# Patient Record
Sex: Female | Born: 1940 | Race: Black or African American | Hispanic: No | Smoking: Former smoker
Health system: Southern US, Community
[De-identification: ages and names within clinical notes are randomized; demographics above are authoritative.]

## PROBLEM LIST (undated history)

## (undated) DIAGNOSIS — E119 Type 2 diabetes mellitus without complications: Secondary | ICD-10-CM

## (undated) DIAGNOSIS — I10 Essential (primary) hypertension: Secondary | ICD-10-CM

## (undated) DIAGNOSIS — J449 Chronic obstructive pulmonary disease, unspecified: Secondary | ICD-10-CM

## (undated) DIAGNOSIS — F419 Anxiety disorder, unspecified: Secondary | ICD-10-CM

## (undated) DIAGNOSIS — F0390 Unspecified dementia without behavioral disturbance: Secondary | ICD-10-CM

## (undated) DIAGNOSIS — M1990 Unspecified osteoarthritis, unspecified site: Secondary | ICD-10-CM

## (undated) DIAGNOSIS — L89154 Pressure ulcer of sacral region, stage 4: Secondary | ICD-10-CM

## (undated) DIAGNOSIS — R64 Cachexia: Secondary | ICD-10-CM

## (undated) DIAGNOSIS — D649 Anemia, unspecified: Secondary | ICD-10-CM

## (undated) DIAGNOSIS — M4856XA Collapsed vertebra, not elsewhere classified, lumbar region, initial encounter for fracture: Secondary | ICD-10-CM

## (undated) HISTORY — PX: VAGINAL HYSTERECTOMY: SHX2639

---

## 2013-12-14 DIAGNOSIS — D649 Anemia, unspecified: Secondary | ICD-10-CM

## 2013-12-14 DIAGNOSIS — M4856XA Collapsed vertebra, not elsewhere classified, lumbar region, initial encounter for fracture: Secondary | ICD-10-CM

## 2013-12-14 DIAGNOSIS — R64 Cachexia: Secondary | ICD-10-CM

## 2013-12-14 HISTORY — DX: Collapsed vertebra, not elsewhere classified, lumbar region, initial encounter for fracture: M48.56XA

## 2013-12-14 HISTORY — DX: Cachexia: R64

## 2013-12-14 HISTORY — DX: Anemia, unspecified: D64.9

## 2013-12-26 ENCOUNTER — Emergency Department (HOSPITAL_COMMUNITY): Payer: Medicare Other

## 2013-12-26 ENCOUNTER — Encounter (HOSPITAL_COMMUNITY): Payer: Self-pay | Admitting: Emergency Medicine

## 2013-12-26 ENCOUNTER — Inpatient Hospital Stay (HOSPITAL_COMMUNITY)
Admission: EM | Admit: 2013-12-26 | Discharge: 2013-12-31 | DRG: 871 | Disposition: A | Discharge status: Home Health | Payer: Medicare Other | Attending: Family Medicine | Admitting: Family Medicine

## 2013-12-26 DIAGNOSIS — F0390 Unspecified dementia without behavioral disturbance: Secondary | ICD-10-CM | POA: Diagnosis present

## 2013-12-26 DIAGNOSIS — G9340 Encephalopathy, unspecified: Secondary | ICD-10-CM | POA: Diagnosis present

## 2013-12-26 DIAGNOSIS — L8990 Pressure ulcer of unspecified site, unspecified stage: Secondary | ICD-10-CM | POA: Diagnosis present

## 2013-12-26 DIAGNOSIS — J4489 Other specified chronic obstructive pulmonary disease: Secondary | ICD-10-CM | POA: Diagnosis present

## 2013-12-26 DIAGNOSIS — Z87891 Personal history of nicotine dependence: Secondary | ICD-10-CM

## 2013-12-26 DIAGNOSIS — A419 Sepsis, unspecified organism: Principal | ICD-10-CM | POA: Diagnosis present

## 2013-12-26 DIAGNOSIS — F3289 Other specified depressive episodes: Secondary | ICD-10-CM | POA: Diagnosis present

## 2013-12-26 DIAGNOSIS — J9600 Acute respiratory failure, unspecified whether with hypoxia or hypercapnia: Secondary | ICD-10-CM | POA: Diagnosis present

## 2013-12-26 DIAGNOSIS — Z681 Body mass index (BMI) 19 or less, adult: Secondary | ICD-10-CM

## 2013-12-26 DIAGNOSIS — L89159 Pressure ulcer of sacral region, unspecified stage: Secondary | ICD-10-CM

## 2013-12-26 DIAGNOSIS — R6520 Severe sepsis without septic shock: Secondary | ICD-10-CM

## 2013-12-26 DIAGNOSIS — J189 Pneumonia, unspecified organism: Secondary | ICD-10-CM | POA: Diagnosis present

## 2013-12-26 DIAGNOSIS — E46 Unspecified protein-calorie malnutrition: Secondary | ICD-10-CM

## 2013-12-26 DIAGNOSIS — F329 Major depressive disorder, single episode, unspecified: Secondary | ICD-10-CM | POA: Diagnosis present

## 2013-12-26 DIAGNOSIS — E43 Unspecified severe protein-calorie malnutrition: Secondary | ICD-10-CM | POA: Diagnosis present

## 2013-12-26 DIAGNOSIS — R64 Cachexia: Secondary | ICD-10-CM | POA: Diagnosis present

## 2013-12-26 DIAGNOSIS — L89109 Pressure ulcer of unspecified part of back, unspecified stage: Secondary | ICD-10-CM | POA: Diagnosis present

## 2013-12-26 DIAGNOSIS — I10 Essential (primary) hypertension: Secondary | ICD-10-CM | POA: Diagnosis present

## 2013-12-26 DIAGNOSIS — E872 Acidosis, unspecified: Secondary | ICD-10-CM | POA: Diagnosis present

## 2013-12-26 DIAGNOSIS — R0902 Hypoxemia: Secondary | ICD-10-CM

## 2013-12-26 DIAGNOSIS — R1311 Dysphagia, oral phase: Secondary | ICD-10-CM | POA: Diagnosis present

## 2013-12-26 DIAGNOSIS — E119 Type 2 diabetes mellitus without complications: Secondary | ICD-10-CM | POA: Diagnosis present

## 2013-12-26 DIAGNOSIS — Z9981 Dependence on supplemental oxygen: Secondary | ICD-10-CM

## 2013-12-26 DIAGNOSIS — R636 Underweight: Secondary | ICD-10-CM | POA: Diagnosis present

## 2013-12-26 DIAGNOSIS — J9620 Acute and chronic respiratory failure, unspecified whether with hypoxia or hypercapnia: Secondary | ICD-10-CM | POA: Diagnosis present

## 2013-12-26 DIAGNOSIS — J449 Chronic obstructive pulmonary disease, unspecified: Secondary | ICD-10-CM | POA: Diagnosis present

## 2013-12-26 HISTORY — DX: Chronic obstructive pulmonary disease, unspecified: J44.9

## 2013-12-26 HISTORY — DX: Anxiety disorder, unspecified: F41.9

## 2013-12-26 HISTORY — DX: Type 2 diabetes mellitus without complications: E11.9

## 2013-12-26 HISTORY — DX: Cachexia: R64

## 2013-12-26 HISTORY — DX: Unspecified osteoarthritis, unspecified site: M19.90

## 2013-12-26 HISTORY — DX: Essential (primary) hypertension: I10

## 2013-12-26 LAB — URINALYSIS, ROUTINE W REFLEX MICROSCOPIC
Bilirubin Urine: NEGATIVE
GLUCOSE, UA: NEGATIVE mg/dL
Hgb urine dipstick: NEGATIVE
Ketones, ur: NEGATIVE mg/dL
LEUKOCYTES UA: NEGATIVE
Nitrite: NEGATIVE
PH: 5 (ref 5.0–8.0)
Protein, ur: NEGATIVE mg/dL
Specific Gravity, Urine: 1.022 (ref 1.005–1.030)
Urobilinogen, UA: 1 mg/dL (ref 0.0–1.0)

## 2013-12-26 LAB — BASIC METABOLIC PANEL
BUN: 25 mg/dL — AB (ref 6–23)
CO2: 36 meq/L — AB (ref 19–32)
Calcium: 9.2 mg/dL (ref 8.4–10.5)
Chloride: 100 mEq/L (ref 96–112)
Creatinine, Ser: 0.61 mg/dL (ref 0.50–1.10)
GFR calc Af Amer: >90 mL/min (ref >90)
GFR calc non Af Amer: 89 mL/min — ABNORMAL LOW (ref >90)
GLUCOSE: 87 mg/dL (ref 70–99)
POTASSIUM: 4.2 meq/L (ref 3.7–5.3)
SODIUM: 146 meq/L (ref 137–147)

## 2013-12-26 LAB — CBC
HEMATOCRIT: 37.8 % (ref 36.0–46.0)
HEMOGLOBIN: 11.5 g/dL — AB (ref 12.0–15.0)
MCH: 30.6 pg (ref 26.0–34.0)
MCHC: 30.4 g/dL (ref 30.0–36.0)
MCV: 100.5 fL — ABNORMAL HIGH (ref 78.0–100.0)
Platelets: 275 10*3/uL (ref 150–400)
RBC: 3.76 MIL/uL — AB (ref 3.87–5.11)
RDW: 13 % (ref 11.5–15.5)
WBC: 16.8 10*3/uL — ABNORMAL HIGH (ref 4.0–10.5)

## 2013-12-26 LAB — POCT I-STAT 3, ART BLOOD GAS (G3+)
ACID-BASE EXCESS: 11 mmol/L — AB (ref 0.0–2.0)
BICARBONATE: 40.3 meq/L — AB (ref 20.0–24.0)
O2 Saturation: 75 %
TCO2: 43 mmol/L (ref 0–100)
pCO2 arterial: 77.6 mmHg (ref 35.0–45.0)
pH, Arterial: 7.323 — ABNORMAL LOW (ref 7.350–7.450)
pO2, Arterial: 46 mmHg — ABNORMAL LOW (ref 80.0–100.0)

## 2013-12-26 LAB — MRSA PCR SCREENING: MRSA BY PCR: NEGATIVE

## 2013-12-26 LAB — POCT I-STAT TROPONIN I: TROPONIN I, POC: 0 ng/mL (ref 0.00–0.08)

## 2013-12-26 LAB — GLUCOSE, CAPILLARY
Glucose-Capillary: 112 mg/dL — ABNORMAL HIGH (ref 70–99)
Glucose-Capillary: 141 mg/dL — ABNORMAL HIGH (ref 70–99)

## 2013-12-26 LAB — PROCALCITONIN: Procalcitonin: 0.1 ng/mL

## 2013-12-26 LAB — CG4 I-STAT (LACTIC ACID): Lactic Acid, Venous: 0.45 mmol/L — ABNORMAL LOW (ref 0.5–2.2)

## 2013-12-26 LAB — PRO B NATRIURETIC PEPTIDE: Pro B Natriuretic peptide (BNP): 130.4 pg/mL — ABNORMAL HIGH (ref 0–125)

## 2013-12-26 MED ORDER — METHYLPREDNISOLONE SODIUM SUCC 125 MG IJ SOLR
125.0000 mg | Freq: Four times a day (QID) | INTRAMUSCULAR | Status: DC
  Administered 2013-12-26 – 2013-12-28 (×7): 125 mg via INTRAVENOUS
  Filled 2013-12-26 (×11): qty 2

## 2013-12-26 MED ORDER — DEXTROSE 5 % IV SOLN
2.0000 g | Freq: Once | INTRAVENOUS | Status: AC
  Administered 2013-12-26: 2 g via INTRAVENOUS
  Filled 2013-12-26: qty 2

## 2013-12-26 MED ORDER — TRAMADOL HCL 50 MG PO TABS: 50.0000 mg | ORAL_TABLET | Freq: Once | ORAL | Status: DC

## 2013-12-26 MED ORDER — KCL IN DEXTROSE-NACL 20-5-0.9 MEQ/L-%-% IV SOLN
INTRAVENOUS | Status: DC
  Administered 2013-12-26 – 2013-12-28 (×3): via INTRAVENOUS
  Administered 2013-12-28: 999 mL via INTRAVENOUS
  Filled 2013-12-26 (×5): qty 1000

## 2013-12-26 MED ORDER — HEPARIN SODIUM (PORCINE) 5000 UNIT/ML IJ SOLN
5000.0000 [IU] | Freq: Three times a day (TID) | INTRAMUSCULAR | Status: DC
  Administered 2013-12-26 – 2013-12-31 (×15): 5000 [IU] via SUBCUTANEOUS
  Filled 2013-12-26 (×17): qty 1

## 2013-12-26 MED ORDER — QUETIAPINE FUMARATE 25 MG PO TABS
25.0000 mg | ORAL_TABLET | Freq: Every day | ORAL | Status: DC
  Administered 2013-12-26 – 2013-12-30 (×5): 25 mg via ORAL
  Filled 2013-12-26 (×6): qty 1

## 2013-12-26 MED ORDER — FAMOTIDINE 40 MG PO TABS
40.0000 mg | ORAL_TABLET | Freq: Two times a day (BID) | ORAL | Status: DC
  Administered 2013-12-26 – 2013-12-31 (×10): 40 mg via ORAL
  Filled 2013-12-26 (×11): qty 1

## 2013-12-26 MED ORDER — IPRATROPIUM-ALBUTEROL 0.5-2.5 (3) MG/3ML IN SOLN
3.0000 mL | Freq: Four times a day (QID) | RESPIRATORY_TRACT | Status: DC
  Administered 2013-12-26 – 2013-12-28 (×9): 3 mL via RESPIRATORY_TRACT
  Filled 2013-12-26 (×9): qty 3

## 2013-12-26 MED ORDER — MORPHINE SULFATE 2 MG/ML IJ SOLN
1.0000 mg | INTRAMUSCULAR | Status: DC | PRN
  Administered 2013-12-27 – 2013-12-28 (×2): 1 mg via INTRAVENOUS
  Filled 2013-12-26 (×2): qty 1

## 2013-12-26 MED ORDER — CITALOPRAM HYDROBROMIDE 10 MG/5ML PO SOLN: 15.0000 mg | Freq: Every day | ORAL | Status: DC

## 2013-12-26 MED ORDER — PERPHENAZINE 4 MG PO TABS
6.0000 mg | ORAL_TABLET | Freq: Every day | ORAL | Status: DC
  Administered 2013-12-26 – 2013-12-30 (×5): 6 mg via ORAL
  Filled 2013-12-26 (×6): qty 1

## 2013-12-26 MED ORDER — CITALOPRAM HYDROBROMIDE 10 MG PO TABS
15.0000 mg | ORAL_TABLET | Freq: Every day | ORAL | Status: DC
  Administered 2013-12-27 – 2013-12-31 (×5): 15 mg via ORAL
  Filled 2013-12-26 (×5): qty 2

## 2013-12-26 MED ORDER — ONDANSETRON HCL 4 MG/2ML IJ SOLN: 4.0000 mg | Freq: Four times a day (QID) | INTRAMUSCULAR | Status: DC | PRN

## 2013-12-26 MED ORDER — DONEPEZIL HCL 10 MG PO TABS
10.0000 mg | ORAL_TABLET | Freq: Every day | ORAL | Status: DC
  Administered 2013-12-26 – 2013-12-30 (×5): 10 mg via ORAL
  Filled 2013-12-26 (×6): qty 1

## 2013-12-26 MED ORDER — MEGESTROL ACETATE 40 MG/ML PO SUSP
400.0000 mg | Freq: Every day | ORAL | Status: DC
  Administered 2013-12-27 – 2013-12-31 (×5): 400 mg via ORAL
  Filled 2013-12-26 (×5): qty 10

## 2013-12-26 MED ORDER — FERROUS SULFATE 325 (65 FE) MG PO TABS
325.0000 mg | ORAL_TABLET | Freq: Three times a day (TID) | ORAL | Status: DC
  Administered 2013-12-26 – 2013-12-29 (×7): 325 mg via ORAL
  Filled 2013-12-26 (×11): qty 1

## 2013-12-26 MED ORDER — VANCOMYCIN HCL IN DEXTROSE 750-5 MG/150ML-% IV SOLN
750.0000 mg | Freq: Once | INTRAVENOUS | Status: AC
  Administered 2013-12-26: 750 mg via INTRAVENOUS
  Filled 2013-12-26: qty 150

## 2013-12-26 MED ORDER — AZTREONAM 2 G IJ SOLR
2.0000 g | Freq: Three times a day (TID) | INTRAMUSCULAR | Status: DC
  Administered 2013-12-26 – 2013-12-29 (×7): 2 g via INTRAVENOUS
  Filled 2013-12-26 (×12): qty 2

## 2013-12-26 MED ORDER — ACETAMINOPHEN 650 MG RE SUPP: 650.0000 mg | Freq: Four times a day (QID) | RECTAL | Status: DC | PRN

## 2013-12-26 MED ORDER — ACETAMINOPHEN 325 MG PO TABS
650.0000 mg | ORAL_TABLET | Freq: Four times a day (QID) | ORAL | Status: DC | PRN
Start: 2013-12-26 — End: 2013-12-31
  Administered 2013-12-31: 650 mg via ORAL
  Filled 2013-12-26: qty 2

## 2013-12-26 MED ORDER — ILOPERIDONE 1 MG PO TABS
3.0000 mg | ORAL_TABLET | Freq: Every day | ORAL | Status: DC
  Administered 2013-12-26 – 2013-12-30 (×5): 3 mg via ORAL
  Filled 2013-12-26 (×6): qty 3

## 2013-12-26 MED ORDER — ONDANSETRON HCL 4 MG PO TABS: 4.0000 mg | ORAL_TABLET | Freq: Four times a day (QID) | ORAL | Status: DC | PRN

## 2013-12-26 MED ORDER — VANCOMYCIN HCL IN DEXTROSE 1-5 GM/200ML-% IV SOLN: 1000.0000 mg | Freq: Once | INTRAVENOUS | Status: DC

## 2013-12-26 MED ORDER — VANCOMYCIN HCL IN DEXTROSE 750-5 MG/150ML-% IV SOLN
750.0000 mg | INTRAVENOUS | Status: DC
  Administered 2013-12-27 – 2013-12-28 (×2): 750 mg via INTRAVENOUS
  Filled 2013-12-26 (×3): qty 150

## 2013-12-26 MED ORDER — SODIUM CHLORIDE 0.9 % IJ SOLN
3.0000 mL | Freq: Two times a day (BID) | INTRAMUSCULAR | Status: DC
  Administered 2013-12-26 – 2013-12-28 (×4): 3 mL via INTRAVENOUS

## 2013-12-26 NOTE — Progress Notes (Signed)
**Note De-Identified  Obfuscation** note patient removed from BIPAP to [facility], per Dr. Adriana Simas

## 2013-12-26 NOTE — Progress Notes (Signed)
ANTIBIOTIC CONSULT NOTE - INITIAL  Pharmacy Consult for Vancomycin and Aztreonam Indication: pneumonia  Allergies  Allergen Reactions  . Namenda [Memantine Hcl] Other (See Comments)    "hallucinations"   . Penicillins Other (See Comments)    unknown    Patient Measurements: Height: 5\' 1"  (154.9 cm) Weight: 94 lb (42.638 kg) IBW/kg (Calculated) : 47.8 Adjusted Body Weight:   Vital Signs: Temp: 98.8 F (37.1 C) (02/13 1239) Temp src: Rectal (02/13 1239) BP: 112/50 mmHg (02/13 1126) Pulse Rate: 104 (02/13 1041) Intake/Output from previous day:   Intake/Output from this shift:    Labs:  Recent Labs  12/26/13 1130  WBC 16.8*  HGB 11.5*  PLT 275  CREATININE 0.61   Estimated Creatinine Clearance: 43.4 ml/min (by C-G formula based on Cr of 0.61). No results found for this basename: VANCOTROUGH, VANCOPEAK, VANCORANDOM, GENTTROUGH, GENTPEAK, GENTRANDOM, TOBRATROUGH, TOBRAPEAK, TOBRARND, AMIKACINPEAK, AMIKACINTROU, AMIKACIN,  in the last 72 hours   Microbiology: No results found for this or any previous visit (from the past 720 hour(s)).  Medical History: Past Medical History  Diagnosis Date  . Hypertension   . COPD (chronic obstructive pulmonary disease)   . Arthritis   . Oxygen dependent   . Anxiety     Medications:  Scheduled:  . vancomycin  750 mg Intravenous Once  . [START ON 12/27/2013] vancomycin  750 mg Intravenous Q24H   Assessment: 73yo female with pneumonia, to start Aztreonam and Vancomycin.  Calculated CrCl ~91ml/min.  Aztreonam 2gm and Vancomycin 1000mg  have been ordered, but not administered yet pending cultures to be drawn.  Goal of Therapy:  Vancomycin trough level 15-20 mcg/ml  Plan:  1-  Aztreonam 2gm IV q8 2-  D/C Vancomycin 1000mg ; informed RN 3-  Vancomycin 750mg  IV q24 4-  Watch renal fxn and f/u cultures  Marisue Humble, PharmD Clinical Pharmacist [facility]

## 2013-12-26 NOTE — Progress Notes (Signed)
Pt transported to 2C06 from E45 on BIPAP without incidence. RT will continue to monitor.

## 2013-12-26 NOTE — ED Notes (Signed)
Pt unable to void at this time, will offer again.

## 2013-12-26 NOTE — ED Notes (Signed)
Pt transported to radiology for CXR 

## 2013-12-26 NOTE — ED Notes (Signed)
Admitting MD at bedside.

## 2013-12-26 NOTE — H&P (Signed)
Family Medicine Teaching Service Attending Note  I interviewed and examined patient Katelyn Rojas and reviewed their tests and x-rays.  I discussed with Dr. Adriana Simas and reviewed their note for today.  I agree with their assessment and plan.     Additionally  Alert oriented x 3 Very thin on bipap ABG noted Treat for HCAP and follow ABGs and O2 sats Niece and patient desire full code but likely not prolonged intubation ventilatory support

## 2013-12-26 NOTE — ED Notes (Signed)
Pt encouraged to breath through nose. MD notified of SpO2 level - instructed RN to increase O2 flow on [place].

## 2013-12-26 NOTE — ED Notes (Signed)
Pt in with family stating that patient has chronic shortness of breath and COPD, pt was recently in the hospital, since being home patient O2 level has still been dropping and she is still sounding congested in her chest, pt also has a wound vac to a wound on her buttocks, denies fevers since being home.

## 2013-12-26 NOTE — ED Provider Notes (Addendum)
CSN: [id number]     Arrival date & time 12/26/13  1015 History   First MD Initiated Contact with Patient 12/26/13 1107     Chief Complaint  Patient presents with  . Shortness of Breath  . Congestion      (Consider location/radiation/quality/duration/timing/severity/associated sxs/prior Treatment) HPI   This a 73 year old female with history of COPD, hypertension who presents with shortness of breath and cough as well as hypoxia. At baseline the patient is on 2.5 L of oxygen at home. Per the patient's family member, the patient was just discharged from [facility] for COPD exacerbation and presumed pneumonia. She just finished a course of Levaquin and is currently on a prednisone taper. She did take a prednisone this morning. Per the patient's family member, the patient is still coughing and had to have increases in her oxygen delivery this morning in order to maintain her oxygen saturations. Family also reports patient has had increased somnolence. They report last April she had a similar episode and she was "retaining." He denies any recent fevers. Patient is incontinent of urine and does have mild dementia. Patient is able to tell me she doesn't hurt anywhere and is able to answer simple questions.  Past Medical History  Diagnosis Date  . Hypertension   . COPD (chronic obstructive pulmonary disease)   . Arthritis   . Oxygen dependent   . Anxiety   . Cachexia   . DM type 2 (diabetes mellitus, type 2)    Past Surgical History  Procedure Laterality Date  . Vaginal hysterectomy      ? Abdominal vs. Vaginal   No family history on file. History  Substance Use Topics  . Smoking status: Former Games developer  . Smokeless tobacco: Not on file  . Alcohol Use: Not on file   OB History   Grav Para Term Preterm Abortions TAB SAB Ect Mult Living                 Review of Systems  Constitutional: Negative for fever.  Respiratory: Positive for cough, shortness of breath and wheezing. Negative for  chest tightness.   Cardiovascular: Negative for chest pain and leg swelling.  Gastrointestinal: Negative for nausea, vomiting and abdominal pain.  Genitourinary: Negative for dysuria.  Musculoskeletal: Negative for back pain.  Skin: Negative for wound.  Neurological: Negative for headaches.  Psychiatric/Behavioral: Negative for confusion.       Increase lethargy  All other systems reviewed and are negative.      Allergies  Namenda and Penicillins  Home Medications   Current Outpatient Rx  Name  Route  Sig  Dispense  Refill  . albuterol (PROVENTIL HFA;VENTOLIN HFA) 108 (90 BASE) MCG/ACT inhaler   Inhalation   Inhale 1-2 puffs into the lungs every 6 (six) hours as needed for wheezing or shortness of breath.         Marland Kitchen albuterol (PROVENTIL) (2.5 MG/3ML) 0.083% nebulizer solution   Nebulization   Take 2.5 mg by nebulization every 4 (four) hours as needed for wheezing or shortness of breath.         . citalopram (CELEXA) 10 MG/5ML suspension   Oral   Take 15 mg by mouth daily.         Marland Kitchen Dextromethorphan-Guaifenesin (GUAIFENESIN DM) 400-20 MG TABS   Oral   Take 1 tablet by mouth every 4 (four) hours as needed (congestion).         Marland Kitchen donepezil (ARICEPT) 10 MG tablet   Oral   Take  10 mg by mouth at bedtime.         . famotidine (PEPCID) 20 MG tablet   Oral   Take 40 mg by mouth 2 (two) times daily.         . ferrous sulfate 325 (65 FE) MG tablet   Oral   Take 325 mg by mouth 3 (three) times daily with meals.         . Fluticasone-Salmeterol (ADVAIR) 250-50 MCG/DOSE AEPB   Inhalation   Inhale 1 puff into the lungs 2 (two) times daily.         . furosemide (LASIX) 20 MG tablet   Oral   Take 20 mg by mouth daily.         . Iloperidone (FANAPT) 1 MG TABS   Oral   Take 3 mg by mouth at bedtime.         Marland Kitchen levofloxacin (LEVAQUIN) 500 MG tablet   Oral   Take 500 mg by mouth daily.         . megestrol (MEGACE) 40 MG/ML suspension   Oral   Take  400 mg by mouth daily.         . naproxen sodium (ANAPROX) 220 MG tablet   Oral   Take 220 mg by mouth 2 (two) times daily as needed (arthritis).         Marland Kitchen perphenazine (TRILAFON) 2 MG tablet   Oral   Take 6 mg by mouth at bedtime.         . predniSONE (DELTASONE) 5 MG tablet   Oral   Take 5-20 mg by mouth See admin instructions. Patient on taper pack: take 10mg  twice daily on day 6 (2-13), take 5mg  three times daily on day 7 and day 8 (2-14, 2-15), take 5mg  twice daily on day 9 and day 10 (2-16, 2-17).  Once completed pack, patient to continue to take 10mg  daily         . QUEtiapine (SEROQUEL) 25 MG tablet   Oral   Take 25 mg by mouth at bedtime.         . traMADol (ULTRAM) 50 MG tablet   Oral   Take 50 mg by mouth every 6 (six) hours as needed for moderate pain.         . Vitamin D, Ergocalciferol, (DRISDOL) 50000 UNITS CAPS capsule   Oral   Take 50,000 Units by mouth every 7 (seven) days. Monday          BP 108/44  Pulse 87  Temp(Src) 98.8 F (37.1 C) (Rectal)  Resp 16  Ht 5\' 1"  (1.549 m)  Wt 94 lb (42.638 kg)  BMI 17.77 kg/m2  SpO2 100% Physical Exam  Nursing note and vitals reviewed. Constitutional: She is oriented to person, place, and time.  Elderly, frail appearing, chronically ill-appearing  HENT:  Head: Normocephalic and atraumatic.  Mucous membranes dry  Eyes: Pupils are equal, round, and reactive to light.  Neck: Neck supple. No JVD present.  Cardiovascular: Normal rate, regular rhythm and normal heart sounds.   No murmur heard. Pulmonary/Chest: Effort normal. No respiratory distress. She has no wheezes.  Coarse breath sounds with crackles in the bases, decreased breath sounds right lower lung, mouth breathing  Abdominal: Soft. Bowel sounds are normal. There is no tenderness.  Musculoskeletal: She exhibits no edema.  Neurological: She is alert and oriented to person, place, and time.  Sleepy but arousable, oriented x3, moves all 4  extremities, 5 out of  5 strength in all 4 extremity  Skin: No rash noted.  Sacral decubitus ulcer with wound vac in place  Psychiatric:  Lethargic    ED Course  Procedures (including critical care time) Labs Review Labs Reviewed  CBC - Abnormal; Notable for the following:    WBC 16.8 (*)    RBC 3.76 (*)    Hemoglobin 11.5 (*)    MCV 100.5 (*)    All other components within normal limits  BASIC METABOLIC PANEL - Abnormal; Notable for the following:    CO2 36 (*)    BUN 25 (*)    GFR calc non Af Amer 89 (*)    All other components within normal limits  PRO B NATRIURETIC PEPTIDE - Abnormal; Notable for the following:    Pro B Natriuretic peptide (BNP) 130.4 (*)    All other components within normal limits  POCT I-STAT 3, BLOOD GAS (G3+) - Abnormal; Notable for the following:    pH, Arterial 7.323 (*)    pCO2 arterial 77.6 (*)    pO2, Arterial 46.0 (*)    Bicarbonate 40.3 (*)    Acid-Base Excess 11.0 (*)    All other components within normal limits  CG4 I-STAT (LACTIC ACID) - Abnormal; Notable for the following:    Lactic Acid, Venous 0.45 (*)    All other components within normal limits  CULTURE, BLOOD (ROUTINE X 2)  CULTURE, BLOOD (ROUTINE X 2)  URINALYSIS, ROUTINE W REFLEX MICROSCOPIC  POCT I-STAT TROPONIN I   Imaging Review Dg Chest 2 View  12/26/2013   CLINICAL DATA:  Shortness of breath and chest congestion.  EXAM: CHEST  2 VIEW  COMPARISON:  12/16/2013 and 12/14/2013  FINDINGS: There is a new infiltrate at the right lung base. Heart size and pulmonary vascularity are normal. Lungs are hyperinflated consistent with COPD. There is diffuse osteopenia with accentuation of the thoracic kyphosis.  There is a new compression fracture of T6 and and more severe compression fractures of T7 and T8 since 12/14/2013.  IMPRESSION: 1. New infiltrate at the right lung base. This is superimposed on severe chronic lung disease. 2. Multiple compression fractures in the mid thoracic spine,  increased since 12/14/2013.   Electronically Signed   By: Geanie CooleyJim  Maxwell M.D.   On: 12/26/2013 12:11    EKG Interpretation    Date/Time:  Friday December 26 2013 10:41:27 EST Ventricular Rate:  103 PR Interval:  126 QRS Duration: 62 QT Interval:  326 QTC Calculation: 427 R Axis:   50 Text Interpretation:  Sinus tachycardia Otherwise normal ECG No prior for comparison Confirmed by HORTON  MD, Toni AmendOURTNEY ([id number]) on 12/26/2013 11:23:19 AM           CRITICAL CARE Performed by: Ross MarcusHORTON, COURTNEY, F   Total critical care time: 35 min  Critical care time was exclusive of separately billable procedures and treating other patients.  Critical care was necessary to treat or prevent imminent or life-threatening deterioration.  Critical care was time spent personally by me on the following activities: development of treatment plan with patient and/or surrogate as well as nursing, discussions with consultants, evaluation of patient's response to treatment, examination of patient, obtaining history from patient or surrogate, ordering and performing treatments and interventions, ordering and review of laboratory studies, ordering and review of radiographic studies, pulse oximetry and re-evaluation of patient's condition.   MDM   Final diagnoses:  HCAP (healthcare-associated pneumonia)  COPD (chronic obstructive pulmonary disease)  Hypoxia   This is a  73 year old female with a history of end-stage COPD. She is lethargic but arousable on exam. Patient is also requiring more supple and oxygen than normal. Results showed a new infiltrate on chest x-ray. She has risk factors for health care acquired pneumonia. Patient will be given aztreonam and vancomycin. ABG is consistent with low O2 and increased CO2 which may be contributing to patient's mental status. Will place on BiPAP given respiratory status and mental status. Urinalysis is pending. Patient will be admitted to the hospital for further  management.    Shon Baton, MD 12/26/13 1444  Shon Baton, MD 12/26/13 [phone]

## 2013-12-26 NOTE — H&P (Signed)
Family Medicine Teaching [facility] History and Physical Service Pager: [phone]  Patient name: Katelyn Rojas Knappenberger Medical record number: [id number] Date of birth: 05/23/1941 Age: 73 y.o. Gender: female  Primary Care Provider: No PCP Per Patient Consultants: None Code Status: Full code  Chief Complaint: SOB  Assessment and Plan: Katelyn Rojas Gartner is a 73 y.o. female presenting with Sepsis secondary to HCAP and acute respiratory failure. PMH is significant for severe COPD, chronic respiratory failure on home oxygen and BiPAP, hypertension, dementia, and diet-controlled DM-2.   Sepsis secondary to HCAP -  patient with infiltrate on x-ray and recent hospitalization.   - Will admit to step down - Continuing broad spectrum antibiotics: Vancomycin, aztreonam.  If fails to improve may need to broaden coverage.   - Will trend Procalcitonin - Daily CBC  Acute on Chronic Respiratory failure, COPD - likely multifactorial and secondary to HCAP and underlying COPD - Antibiotics as above - Solumedrol 125 mg Q6H; with plans for prolonged taper - Scheduled Duonebs, Q6 - Continue BiPAP.  Will repeat ABG later this evening.  - Will monitor closely as patient is at high risk for decompensating.   Hx of HTN - Stable currently  - No medications needed at this time - Will monitor closley, especially for hypotension in the setting of sepsis.  Dementia and Hx of Depression - Continuing home Aricept, IIoperidone, Perphenazine, and Seroquel (discussed this with pharmacy)  Cachexia, and likely Protein calorie malnutrition - Continuing Megace. - Will consider nutrition consult during admission.  Sacral decubitus ulcer - Wound VAC in place.   - Will consult wound care.   DM-2, diet controlled -  will monitor CBGs.  FEN/GI: D5NS w/ 20 KCl @ 75 mL/hr. NPO currently. Prophylaxis: Heparin  Disposition: Stepdown, pending clinical improvement.  History of Present Illness:  Katelyn Rojas  Evans is a 73 y.o. female presenting with severe COPD, chronic respiratory failure on home oxygen and BiPAP, hypertension, dementia, and diet-controlled DM-2 who presents with shortness of breath and hypoxia.  Level V caveat applies as patient is on BiPAP and cannot give complete history.  History obtained from the caregiver (niece) and EMR.  Niece reports that Ms. Nolen MuMcKinney was seen in urgent care on urgent care on February 1st for cough and "rattling in the chest".  At that time a chest x-ray was performed and was negative.  She was placed on steroids and antibiotics for COPD exacerbation.  She continued to not do well and was admitted to [facility] on [facility]rd.  She was treated for pneumonia and for COPD exacerbation.  She required continued BiPAP during admission for CO2 narcosis.  Patient improved and was discharged on Saturday February 7th.    Today, the niece reports that she became hypoxic at home with oxygen saturation in the 50s.  She also became more somnolent.  Given these findings she was taken to the emergency room for evaluation.    In the ED, patient continued to be hypoxic.  ABG revealed PCO2 of 77.6 and PO2 of 46.  Patient was placed on BiPAP.  Further workup revealed, right lower lobe pneumonia and lactic acidosis (lactate 0.45).  Urinalysis was unremarkable.    Review Of Systems: Per HPI with the following additions: Cough, shortness of breath.  Remainder of review of systems unable to be obtained due to level V caveat.  Patient Active Problem List   Diagnosis Date Noted  . Acute respiratory failure 12/26/2013   Past Medical History: Past Medical History  Diagnosis Date  . Hypertension   . COPD (chronic obstructive pulmonary disease)   . Arthritis   . Oxygen dependent   . Anxiety   . Cachexia   . DM type 2 (diabetes mellitus, type 2)    Past Surgical History: Past Surgical History  Procedure Laterality Date  . Vaginal hysterectomy      ?  Abdominal vs. Vaginal    Social History: History  Substance Use Topics  . Smoking status: Former Games developer  . Smokeless tobacco: Not on file  . Alcohol Use: Not on file   Please also refer to relevant sections of EMR.  Family History: No family history on file.  Allergies and Medications: Allergies  Allergen Reactions  . Namenda [Memantine Hcl] Other (See Comments)    "hallucinations"   . Penicillins Other (See Comments)    unknown   Objective: BP 108/44  Pulse 87  Temp(Src) 99.5 F (37.5 C) (Rectal)  Resp 16  Ht 5\' 1"  (1.549 m)  Wt 94 lb (42.638 kg)  BMI 17.77 kg/m2  SpO2 100% Exam: General: Chronically ill appearing African American female, somnolent but arousable, on BiPAP.  Appears comfortable. HEENT: NCAT. No scleral icterus. Small pupils, but ERRLA.  Cardiovascular: Tachycardic.  Did not appreciate murmur. Respiratory: Decreased, course breath sounds with bibasilar crackles R>L. Abdomen: Flat, soft, diffusely tender to palpation.  No rebound or guarding.  Midline scar present below the umbilicus. Extremities: No LE edema. Skin: Sacral decubitus ulcer with wound VAC in place. Neuro: Easily arousable. Oriented to person and place.   Labs and Imaging: CBC BMET   Recent Labs Lab 12/26/13 1130  WBC 16.8*  HGB 11.5*  HCT 37.8  PLT 275    Recent Labs Lab 12/26/13 1130  NA 146  K 4.2  CL 100  CO2 36*  BUN 25*  CREATININE 0.61  GLUCOSE 87  CALCIUM 9.2     BNP    Component Value Date/Time   PROBNP 130.4* 12/26/2013 1130   Lactic Acid - 0.45  ABG    Component Value Date/Time   PHART 7.323* 12/26/2013 1252   PCO2ART 77.6* 12/26/2013 1252   PO2ART 46.0* 12/26/2013 1252   HCO3 40.3* 12/26/2013 1252   TCO2 43 12/26/2013 1252   O2SAT 75.0 12/26/2013 1252   Dg Chest 2 View 12/26/2013   CLINICAL DATA:  Shortness of breath and chest congestion.  EXAM: CHEST  2 VIEW  COMPARISON:  12/16/2013 and 12/14/2013  FINDINGS: There is a new infiltrate at the right  lung base. Heart size and pulmonary vascularity are normal. Lungs are hyperinflated consistent with COPD. There is diffuse osteopenia with accentuation of the thoracic kyphosis.  There is a new compression fracture of T6 and and more severe compression fractures of T7 and T8 since 12/14/2013.  IMPRESSION: 1. New infiltrate at the right lung base. This is superimposed on severe chronic lung disease. 2. Multiple compression fractures in the mid thoracic spine, increased since 12/14/2013.     Tommie Sams, DO 12/26/2013, 3:26 PM PGY-2, [facility] FPTS Intern pager: [phone], text pages welcome

## 2013-12-27 DIAGNOSIS — J449 Chronic obstructive pulmonary disease, unspecified: Secondary | ICD-10-CM

## 2013-12-27 DIAGNOSIS — L89109 Pressure ulcer of unspecified part of back, unspecified stage: Secondary | ICD-10-CM

## 2013-12-27 DIAGNOSIS — L8990 Pressure ulcer of unspecified site, unspecified stage: Secondary | ICD-10-CM

## 2013-12-27 DIAGNOSIS — F0390 Unspecified dementia without behavioral disturbance: Secondary | ICD-10-CM

## 2013-12-27 DIAGNOSIS — R0902 Hypoxemia: Secondary | ICD-10-CM

## 2013-12-27 LAB — BLOOD GAS, ARTERIAL
Acid-Base Excess: 11.9 mmol/L — ABNORMAL HIGH (ref 0.0–2.0)
BICARBONATE: 37.2 meq/L — AB (ref 20.0–24.0)
Drawn by: 249101
O2 Content: 4 L/min
O2 SAT: 95.3 %
Patient temperature: 98.6
TCO2: 39.1 mmol/L (ref 0–100)
pCO2 arterial: 61.3 mmHg (ref 35.0–45.0)
pH, Arterial: 7.4 (ref 7.350–7.450)
pO2, Arterial: 71.9 mmHg — ABNORMAL LOW (ref 80.0–100.0)

## 2013-12-27 LAB — CBC
HCT: 33 % — ABNORMAL LOW (ref 36.0–46.0)
Hemoglobin: 10 g/dL — ABNORMAL LOW (ref 12.0–15.0)
MCH: 30.3 pg (ref 26.0–34.0)
MCHC: 30.3 g/dL (ref 30.0–36.0)
MCV: 100 fL (ref 78.0–100.0)
Platelets: 274 10*3/uL (ref 150–400)
RBC: 3.3 MIL/uL — ABNORMAL LOW (ref 3.87–5.11)
RDW: 13.1 % (ref 11.5–15.5)
WBC: 16.9 10*3/uL — AB (ref 4.0–10.5)

## 2013-12-27 LAB — BASIC METABOLIC PANEL
BUN: 21 mg/dL (ref 6–23)
CHLORIDE: 99 meq/L (ref 96–112)
CO2: 35 mEq/L — ABNORMAL HIGH (ref 19–32)
CREATININE: 0.55 mg/dL (ref 0.50–1.10)
Calcium: 8.8 mg/dL (ref 8.4–10.5)
GFR calc Af Amer: >90 mL/min (ref >90)
GFR calc non Af Amer: >90 mL/min (ref >90)
GLUCOSE: 179 mg/dL — AB (ref 70–99)
Potassium: 4.6 mEq/L (ref 3.7–5.3)
Sodium: 143 mEq/L (ref 137–147)

## 2013-12-27 LAB — GLUCOSE, CAPILLARY
Glucose-Capillary: 162 mg/dL — ABNORMAL HIGH (ref 70–99)
Glucose-Capillary: 160 mg/dL — ABNORMAL HIGH (ref 70–99)
Glucose-Capillary: 156 mg/dL — ABNORMAL HIGH (ref 70–99)
Glucose-Capillary: 148 mg/dL — ABNORMAL HIGH (ref 70–99)

## 2013-12-27 LAB — TROPONIN I: Troponin I: <0.3 ng/mL (ref <0.30)

## 2013-12-27 MED ORDER — TRAMADOL HCL 50 MG PO TABS
50.0000 mg | ORAL_TABLET | Freq: Four times a day (QID) | ORAL | Status: DC | PRN
  Administered 2013-12-30 – 2013-12-31 (×2): 50 mg via ORAL
  Filled 2013-12-27 (×2): qty 1

## 2013-12-27 MED ORDER — LORAZEPAM 2 MG/ML IJ SOLN: 1.0000 mg | Freq: Once | INTRAMUSCULAR | Status: AC | PRN

## 2013-12-27 NOTE — Progress Notes (Signed)
Family Practice Teaching Service Interval Progress Note  Subjective: Since morning rounds, patient reported to have developed increased confusion per daughter, evaluated patient at that time and patient did appear to be slightly less alert and with decreased awareness, but still appropriately oriented. Overall stable on 4L O2 without any acute concerns. Discussed potential for increased CO2, given h/o COPD and chronic CO2 retention causing inc confusion. Ordered ABG. Paged around 1730 regarding new ABG results overall improved since prior ABG on admission (24 hrs ago), but with persistent CO2 elevation. Requested pt to be placed back on BiPAP (continue overnight) due to clinical change.  Re-evaluated patient around 1945 after being placed on BiPAP. Patient was very alert and greeted me with a strong voice as I entered her room, she states that she "feels much better" and is "breathing better". Her daughter was at bedside, and confirms that she seems to be improved on BiPAP. Still c/o occasional coughing, now with some improved secretion mobilization and mucus production. Also admits some chronic chest / abdominal pain that she frequently experiences, denies any new changes.  Objective: BP 143/66  Pulse 82  Temp(Src) 99.3 F (37.4 C) (Axillary)  Resp 15  Ht 5\' 1"  (1.549 m)  Wt 86 lb 13.8 oz (39.4 kg)  BMI 16.42 kg/m2  SpO2 100% General: Chronically ill and cachetic, awake watching TV, pleasant, on BiPAP, appears comfortable and NAD HEENT: PERRL Cardiovascular: RRR, no murmur Respiratory: Mildly improved air movement b/l, with persistent coarse breath sounds with bibasilar crackles R>L Abdomen: Flat, soft, stable generalized tenderness to palpation. No rebound or guarding Extremities: No LE edema, very thin ext, WWP.  Skin: Sacral decubitus ulcer with wound VAC and dressing in place.  Neuro: awake, alert, oriented, cooperative with exam  ABG 2/14 @ 1252 pH 7.32 / pCO2 77.6 / pO2 46.0 /  Bicarb 40.3 ABG 2/14 @ 1740 pH 7.4  / pCO2 61.3 / pO2 71.9 / Bicarb 37.2  Assessment & Plan: Briefly, Katelyn Rojas is a 74 y.o. female presenting with Sepsis secondary to HCAP and acute respiratory failure. Suspect chronic CO2 retention given h/o COPD. - clinically improved mental status BiPAP - ABG reflects continued improvement since admission - no indication to repeat ABG to follow-up response to BiPAP at this time, unless clinical decompensation vs worsened mental status while on BiPAP, would consider repeat ABG - some noted improvement of secretion mobility, however concern that patient may be at risk for future aspiration PNA, currently still NPO pending repeat SLP bedside swallow eval in AM - continue all other current management listed in daily progress note  Saralyn Pilar, DO Family Medicine PGY-1 Service Pager [phone]

## 2013-12-27 NOTE — Progress Notes (Signed)
Rt Note: Pt taken off BIPAP and placed on 4L [place]. Pt tolerating well No distress noted at this time.

## 2013-12-27 NOTE — Progress Notes (Addendum)
INITIAL NUTRITION ASSESSMENT  DOCUMENTATION CODES Per approved criteria  -Severe malnutrition in the context of chronic illness -Underweight   INTERVENTION:  Recommend monitoring magnesium, potassium, and phosphorus daily for at least 3 days throughout nutrition advancement, MD to replete as needed, as pt is at risk for refeeding syndrome given dx of severe malnutrition.   If unable to advance diet, recommend initiation of nutrition support if within GOC to maximize wound healing. Please consult RD for enteral nutrition recommendations/initiation if warranted.  If/when diet order permits, recommend Glucerna Shakes po BID, 30 ml Prostat liquid protein PO BID, and MVI daily, to maximize wound healing.  Recommend consideration of SLP evaluation to determine most appropriate diet texture and liquid consistency given dementia and frequent PNA's. Recommend least restrictive diet to maximize PO intake.   Consider 2 week therapy of 220 mg zinc sulfate daily and 500 mg vitamin C BID supplementation to support wound healing.   RD to continue to follow nutrition care plan.  NUTRITION DIAGNOSIS: Increased nutrient needs related to wound healing, COPD and repletion as evidenced by estimated needs.   Goal: Diet advancement as tolerated. Intake to meet >90% of estimated nutrition needs.  Monitor:  weight trends, lab trends, I/O's, diet advancement/PO intake  Reason for Assessment: Malnutrition Screening Tool  73 y.o. female  Admitting Dx: PNA  ASSESSMENT: PMHx significant for severe COPD, chronic respiratory failure, HTN, dementia, and diet controlled DM. Pt with COPD exacerbation at beginning of this month, went to OSH and was treated for PNA and COPD exacerbation, d/c home on 2/7. Admitted now with SOB and hypoxia.  Work-up reveals sepsis 2/2 RLL PNA.  Placed on Bipap on admission. Pt noted to have sacral decubitus ulcer with wound VAC in place. WOC RN consult in place.  Currently  ordered for Megace. NPO. Unable to answer any of my questions. No family at bedside.  Nutrition Focused Physical Exam:  Subcutaneous Fat:  Orbital Region: severe depletion Upper Arm Region: severe depletion Thoracic and Lumbar Region: severe depletion  Muscle:  Temple Region: severe depletion Clavicle Bone Region: severe depletion Clavicle and Acromion Bone Region: severe depletion Scapular Bone Region: severe depletion Dorsal Hand: severe depletion Patellar Region: severe depletion Anterior Thigh Region: severe depletion Posterior Calf Region: severe depletion  Edema: none  Pt meets criteria for severe MALNUTRITION in the context of chronic illness as evidenced by severe fat and muscle mass loss.   Height: Ht Readings from Last 1 Encounters:  12/26/13 5\' 1"  (1.549 m)    Weight: Wt Readings from Last 1 Encounters:  12/27/13 86 lb 13.8 oz (39.4 kg)    Ideal Body Weight: 105 lb  % Ideal Body Weight: 82%  Wt Readings from Last 10 Encounters:  12/27/13 86 lb 13.8 oz (39.4 kg)    Usual Body Weight: n/a  % Usual Body Weight: n/a  BMI:  Body mass index is 16.42 kg/(m^2). Underweight  Estimated Nutritional Needs: Kcal: 1400 - 1600 Protein: 60 - 75 g Fluid: approx 1.5 liters daily  Skin: sacral wound; vac in place; WOC RN consult pending  Diet Order: NPO  EDUCATION NEEDS: -No education needs identified at this time   Intake/Output Summary (Last 24 hours) at 12/27/13 0926 Last data filed at 12/27/13 0900  Gross per 24 hour  Intake 1243.75 ml  Output      1 ml  Net 1242.75 ml    Last BM: PTA  Labs:   Recent Labs Lab 12/26/13 1130 12/27/13 0314  NA 146 143  K 4.2 4.6  CL 100 99  CO2 36* 35*  BUN 25* 21  CREATININE 0.61 0.55  CALCIUM 9.2 8.8  GLUCOSE 87 179*    CBG (last 3)   Recent Labs  12/26/13 1734 12/26/13 2213 12/27/13 0759  GLUCAP 112* 141* 162*    Scheduled Meds: . aztreonam  2 g Intravenous 3 times per day  . citalopram   15 mg Oral Daily  . donepezil  10 mg Oral QHS  . famotidine  40 mg Oral BID  . ferrous sulfate  325 mg Oral TID WC  . heparin  5,000 Units Subcutaneous 3 times per day  . Iloperidone  3 mg Oral QHS  . ipratropium-albuterol  3 mL Nebulization Q6H  . megestrol  400 mg Oral Daily  . methylPREDNISolone (SOLU-MEDROL) injection  125 mg Intravenous Q6H  . perphenazine  6 mg Oral QHS  . QUEtiapine  25 mg Oral QHS  . sodium chloride  3 mL Intravenous Q12H  . traMADol  50 mg Oral Once  . vancomycin  750 mg Intravenous Q24H    Continuous Infusions: . dextrose 5 % and 0.9 % NaCl with KCl 20 mEq/L 75 mL/hr at 12/27/13 8676    Past Medical History  Diagnosis Date  . Hypertension   . COPD (chronic obstructive pulmonary disease)   . Arthritis   . Oxygen dependent   . Anxiety   . Cachexia   . DM type 2 (diabetes mellitus, type 2)     Past Surgical History  Procedure Laterality Date  . Vaginal hysterectomy      ? Abdominal vs. Vaginal    Jarold Motto MS, RD, LDN Inpatient Registered Dietitian Pager: [phone] After-hours pager: [phone]

## 2013-12-27 NOTE — Progress Notes (Signed)
FMTS Attending Note  I personally saw and evaluated the patient. The plan of care was discussed with the resident team. I agree with the assessment and plan as documented by the resident.   Patient currently sleeping, tolerating BiPAP well.   HCAP/Sepsis - continue Vanco and Aztreonam, follow blood cultures Hypoxia/Acute on Chronic Respiratory Failures - wean BiPAP as tolerated COPD - transition to PO steroids in next 24 hours if tolerating PO Dementia/Depression - agree with continuing home medications, discuss goals of care with family Severe Protein Malnutrition - agree with resident plan as documented  Donnella Sham MD

## 2013-12-27 NOTE — Evaluation (Addendum)
Clinical/Bedside Swallow Evaluation Patient Details  Name: Katelyn Rojas MRN: [id number] Date of Birth: Nov 05, 1941  Today's Date: 12/27/2013 Time: 1217-1255 SLP Time Calculation (min): 38 min  Past Medical History:  Past Medical History  Diagnosis Date  . Hypertension   . COPD (chronic obstructive pulmonary disease)   . Arthritis   . Oxygen dependent   . Anxiety   . Cachexia   . DM type 2 (diabetes mellitus, type 2)    Past Surgical History:  Past Surgical History  Procedure Laterality Date  . Vaginal hysterectomy      ? Abdominal vs. Vaginal   HPI:  Katelyn Rojas is a 73 y.o. female presenting with Sepsis secondary to HCAP and acute respiratory failure. PMH is significant for severe COPD, chronic respiratory failure on home oxygen and BiPAP, hypertension, dementia, and diet-controlled DM-2. CXR (new RLL infiltrate).  BSE indicated to assess risk for aspiration and recommend safest, PO diet.     Assessment / Plan / Recommendation Clinical Impression  BSE completed.  Venti mask removed by RN  and nasal cannula placed for PO trials.  Dysphagia indicated by decreased sensory and overall weakness.   Multiple swallows per sip of thin liquid by cup indicating pharyngeal weakness.  Wet congested, cough s/p trials.   Oxygen saturations remained in mid 90's but patient with increased WOB s/p PO trials requiring RN to replace venti mask.  Due to s/s present and current respiratory status recommend safest is continued NPO status with exception of medication crushed in puree.  ST to f/u on 12/28/13 to assess PO readiness.  Katelyn Rojas patient's ability to resume PO's good as respiratory status improves.      Aspiration Risk  Moderate    Diet Recommendation NPO except meds   Medication Administration: Crushed with puree    Other  Recommendations Oral Care Recommendations: Oral care Q4 per protocol   Follow Up Recommendations  Skilled Nursing facility;24 hour supervision/assistance     Frequency and Duration min 2x/week  2 weeks       SLP Swallow Goals Please refer to Care Plan  Swallow Study Prior Functional Status   No prior reports of dysphagia     General Date of Onset: 12/26/13 HPI: Katelyn Rojas is a 73 y.o. female presenting with Sepsis secondary to HCAP and acute respiratory failure. PMH is significant for severe COPD, chronic respiratory failure on home oxygen and BiPAP, hypertension, dementia, and diet-controlled DM-2. Type of Study: Bedside swallow evaluation Diet Prior to this Study: NPO Temperature Spikes Noted: No Respiratory Status: Nasal cannula History of Recent Intubation: No Behavior/Cognition: Alert;Cooperative;Pleasant mood;Confused;Requires cueing Oral Cavity - Dentition: Poor condition;Missing dentition Self-Feeding Abilities: Total assist Patient Positioning: Upright in chair Baseline Vocal Quality: Clear Volitional Cough: Wet;Congested Volitional Swallow: Able to elicit    Oral/Motor/Sensory Function Overall Oral Motor/Sensory Function: Impaired Lingual Strength: Reduced Lingual Sensation: Reduced Velum: Within Functional Limits Mandible: Within Functional Limits   Ice Chips Ice chips: Not tested   Thin Liquid Thin Liquid: Impaired Presentation: Cup;Spoon Pharyngeal  Phase Impairments: Suspected delayed Swallow;Decreased hyoid-laryngeal movement;Multiple swallows;Throat Clearing - Delayed;Cough - Delayed;Wet Vocal Quality    Nectar Thick Nectar Thick Liquid: Not tested   Honey Thick Honey Thick Liquid: Not tested   Puree Puree: Impaired Oral Phase Functional Implications: Prolonged oral transit Pharyngeal Phase Impairments: Suspected delayed Swallow;Decreased hyoid-laryngeal movement   Solid   GO    Solid: Not tested      Katelyn Fowler MS, CCC-SLP [phone] [facility] 12/27/2013,12:58 PM

## 2013-12-27 NOTE — Progress Notes (Signed)
Utilization Review Completed.  

## 2013-12-27 NOTE — Progress Notes (Signed)
RT Note: I spoke with pt about her use of home Bipap QHS, her daughter stated she has her machine with her and pt would rather wear it. I checked unit and mask, added H2o to chamber and tested pressure with pt for her comfort. Pt's daughter states she will place her on it after the RN brings her night meds. 3L O2 bleed in per home use. RT will continue to monitor

## 2013-12-27 NOTE — Progress Notes (Signed)
Family Medicine Teaching Service Daily Progress Note Intern Pager: [phone]  Patient name: Katelyn Rojas Medical record number: [id number] Date of birth: 07/15/1941 Age: 73 y.o. Gender: female  Primary Care Provider: No PCP Per Patient Consultants: None Code Status: Full  Pt Overview and Major Events to Date:  12/26/13 - admitted for acute resp failure, sepsis with RLL HCAP, on BiPAP, LA 0.45, Pro-calcitonin 0.10, WBC 16.8 12/27/13 - off BiPAP during day, SLP eval, afebrile, WBC 16.9  Assessment and Plan: Katelyn Rojas is a 73 y.o. female presenting with Sepsis secondary to HCAP and acute respiratory failure. PMH is significant for severe COPD, chronic respiratory failure on home oxygen and BiPAP, hypertension, dementia, and diet-controlled DM-2.  # Sepsis secondary to RLL HCAP - Stable Patient presented after recent multiple day hospitalization for PNA + COPD exac (DC'd 12/20/13) with decline at home. On admission to SDU, septic with tachycardia and hypoxia, identified source on CXR (new RLL infiltrate).  - remain on SDU, clinically improved from admission, still at risk for acute decompensation - continue monitoring - afebrile (Tmax 99.7), tachycardic 105, stable BP with good MAP 74, 98% on BiPAP - Continuing broad spectrum antibiotics: (if febrile vs clinical worsening consider broaden coverage)      - Vancomycin IV (2/13>>)      - Aztreonam IV (2/13>>) - Procalcitonin trend q 48 hrs 0.10 >>  - f/u daily CBC, WBC stable 16.8-->16.9 - Blood cultures x 2 (collected 2/13 - pending)  # Acute on Chronic Respiratory failure, COP H/o chronic lung disease with COPD, likely multifactorial and worsened secondary to HCAP - See above "RLL HCAP" - closely f/u resp status with RT - taken off BiPAP this AM, switched to Venturi Mask during day, and plan to resume BiPAP nightly - consider repeat ABG if clinical decompensation, high risk - continue Solumedrol 125 mg Q6H; with plans for  prolonged taper  - continue DuoNebs q 6 hr  # Chest Pain, suspect secondary to acute respiratory failure and co-morbidities Continues to c/o of chest wall pain, initial POC-Troponin (negative), EKG with sinus tachy without acute ST-T wave changes. - order Troponin-I x 1, will trend if elevated - Morphine IV 1mg  q 4 hr PRN  # Hx of HTN  - Stable currently  - No medications needed at this time  - Will monitor closley, especially for hypotension in the setting of sepsis, IVF bolus as necessary  # Dementia and Hx of Depression  - Continuing home Aricept, IIoperidone, Perphenazine, and Seroquel (discussed this with pharmacy)  # Severe malnutrition in context of chronic illness, with cachexia and protein/calorie malnutrition Significant nutritional concerns for inc nutritional demand in setting of chronic illness, poor wound healing, and high risk for re-feeding syndrome. Reported previously tolerated solid foods, however acutely concerned about possible inc aspiration risk in setting of acute resp failure. - Continuing Megace - c/s Nutrition - greatly appreciate recommendations     - recommend initial SLP eval to determine least restrictive diet / texture / liquids etc.     - ordered SLP bedside swallow, pending recommendation for diet order     - If able to advance diet, recommend to supplement between meals with Glucerna Shakes PO BID, 30mL Prostat Liquid Protein PO BID, and MVI daily (if solid diet).     - consider 2 week therapy Zinc sulfate 220mg  daily + Vitamin C 500mg  BID for improved wound healing  # Sacral decubitus ulcer  - Wound VAC in place - consulted WOC, f/u recs  DM-2, diet controlled  - will monitor CBGs.  FEN/GI: D5NS w/ 20 KCl @ 75 mL/hr (x 72 hrs). NPO currently (pending bedside swallow, SLP).  Prophylaxis: Heparin SQ  Disposition: Stepdown status on admission, remains SDU status, pending clinical improvement. Estimated hospitalization >3 days, concern for high risk  of potential decompensation, low threshold for contacting CCM.  Subjective: No acute events overnight, nursing reported that pt remained on BiPAP through the night with stable vitals and appeared comfortable. This morning patient remains on BiPAP and with very weak voice difficulty communicating, she is able to nod/shake head yes/no, and follows commands appropriately. Appreciate concern of chest pain, otherwise no acute concerns since yesterday.  Objective: Temp:  [98.1 F (36.7 C)-99.7 F (37.6 C)] 99.7 F (37.6 C) (02/14 0900) Pulse Rate:  [74-105] 105 (02/14 0821) Resp:  [15-25] 21 (02/14 0821) BP: (87-118)/(37-61) 118/61 mmHg (02/14 0821) SpO2:  [79 %-100 %] 98 % (02/14 0821) FiO2 (%):  [40 %-50 %] 50 % (02/14 0820) Weight:  [86 lb 10.3 oz (39.3 kg)-94 lb (42.638 kg)] 86 lb 13.8 oz (39.4 kg) (02/14 0405) Physical Exam: General: Chronically ill and cachetic, sleeping but awakens on exam, remains on BiPAP. Appears comfortable, NAD HEENT: NCAT. No scleral icterus. PERRL Cardiovascular: Tachycardic, regular rhythm, did not hear murmur Respiratory: Decreased, course breath sounds with bibasilar crackles R>L.  Abdomen: Flat, soft, stable generalized tenderness to palpation. No rebound or guarding. Midline scar present below the umbilicus.  Extremities: No LE edema, very thin ext, WWP.  Skin: Sacral decubitus ulcer with wound VAC and dressing in place.  Neuro: awake, somnolent, oriented to person / place. Cooperative with exam but reduced participation, reduced muscle strength b/l ext 4/5 (suspect near baseline).  Laboratory:  Recent Labs Lab 12/26/13 1130 12/27/13 0314  WBC 16.8* 16.9*  HGB 11.5* 10.0*  HCT 37.8 33.0*  PLT 275 274    Recent Labs Lab 12/26/13 1130 12/27/13 0314  NA 146 143  K 4.2 4.6  CL 100 99  CO2 36* 35*  BUN 25* 21  CREATININE 0.61 0.55  CALCIUM 9.2 8.8  GLUCOSE 87 179*   ABG    Component Value Date/Time   PHART 7.323* 12/26/2013 1252   PCO2ART  77.6* 12/26/2013 1252   PO2ART 46.0* 12/26/2013 1252   HCO3 40.3* 12/26/2013 1252   TCO2 43 12/26/2013 1252   O2SAT 75.0 12/26/2013 1252   UA - neg nitrite, neg leuks, neg hgb - not suggestive of infection POCT-Trop - negative Troponin-I - ordered Pro BNP 130.4 Lactic Acid 0.45 Blood culture x 2 (collected 2/13 - pending) Pro-calcitonin 0.10 >>  Imaging/Diagnostic Tests:  2/13 EKG Sinus tachycardia, HR 103, without evidence of ST-T wave changes  2/13 CXR 2v IMPRESSION:  1. New infiltrate at the right lung base. This is superimposed on  severe chronic lung disease.  2. Multiple compression fractures in the mid thoracic spine,  increased since 12/14/2013.  Saralyn Pilar, DO 12/27/2013, 12:27 PM PGY-1, [facility] FPTS Intern pager: [phone], text pages welcome

## 2013-12-28 LAB — CBC WITH DIFFERENTIAL/PLATELET
Basophils Absolute: 0 10*3/uL (ref 0.0–0.1)
Basophils Relative: 0 % (ref 0–1)
EOS ABS: 0 10*3/uL (ref 0.0–0.7)
EOS PCT: 0 % (ref 0–5)
HEMATOCRIT: 32.6 % — AB (ref 36.0–46.0)
HEMOGLOBIN: 9.9 g/dL — AB (ref 12.0–15.0)
LYMPHS ABS: 0.3 10*3/uL — AB (ref 0.7–4.0)
Lymphocytes Relative: 2 % — ABNORMAL LOW (ref 12–46)
MCH: 30.4 pg (ref 26.0–34.0)
MCHC: 30.4 g/dL (ref 30.0–36.0)
MCV: 100 fL (ref 78.0–100.0)
MONO ABS: 0.6 10*3/uL (ref 0.1–1.0)
MONOS PCT: 5 % (ref 3–12)
Neutro Abs: 11.8 10*3/uL — ABNORMAL HIGH (ref 1.7–7.7)
Neutrophils Relative %: 93 % — ABNORMAL HIGH (ref 43–77)
PLATELETS: 266 10*3/uL (ref 150–400)
RBC: 3.26 MIL/uL — ABNORMAL LOW (ref 3.87–5.11)
RDW: 13 % (ref 11.5–15.5)
WBC: 12.7 10*3/uL — ABNORMAL HIGH (ref 4.0–10.5)

## 2013-12-28 LAB — GLUCOSE, CAPILLARY
Glucose-Capillary: 150 mg/dL — ABNORMAL HIGH (ref 70–99)
Glucose-Capillary: 143 mg/dL — ABNORMAL HIGH (ref 70–99)
Glucose-Capillary: 147 mg/dL — ABNORMAL HIGH (ref 70–99)
Glucose-Capillary: 127 mg/dL — ABNORMAL HIGH (ref 70–99)

## 2013-12-28 LAB — PROCALCITONIN: Procalcitonin: <0.1 ng/mL

## 2013-12-28 MED ORDER — PREDNISONE 50 MG PO TABS
50.0000 mg | ORAL_TABLET | Freq: Every day | ORAL | Status: DC
  Administered 2013-12-29 – 2013-12-31 (×3): 50 mg via ORAL
  Filled 2013-12-28 (×4): qty 1

## 2013-12-28 MED ORDER — RESOURCE THICKENUP CLEAR PO POWD
ORAL | Status: DC | PRN
  Filled 2013-12-28: qty 125

## 2013-12-28 MED ORDER — IPRATROPIUM-ALBUTEROL 0.5-2.5 (3) MG/3ML IN SOLN
3.0000 mL | Freq: Three times a day (TID) | RESPIRATORY_TRACT | Status: DC
  Administered 2013-12-29 – 2013-12-31 (×8): 3 mL via RESPIRATORY_TRACT
  Filled 2013-12-28 (×9): qty 3

## 2013-12-28 MED ORDER — IPRATROPIUM-ALBUTEROL 0.5-2.5 (3) MG/3ML IN SOLN: 3.0000 mL | RESPIRATORY_TRACT | Status: DC | PRN

## 2013-12-28 MED ORDER — METHYLPREDNISOLONE SODIUM SUCC 125 MG IJ SOLR
125.0000 mg | Freq: Two times a day (BID) | INTRAMUSCULAR | Status: DC
  Filled 2013-12-28 (×2): qty 2

## 2013-12-28 NOTE — Progress Notes (Signed)
FMTS Attending Note   I personally saw and evaluated the patient. The plan of care was discussed with the resident team. I agree with the assessment and plan as documented by the resident.   Patient more alert today, off BiPAP   HCAP/Sepsis - continue Vanco and Aztreonam, follow blood cultures, transition to PO antibiotics on 12/29/13 Hypoxia/Acute on Chronic Respiratory Failures - continue BiPAP at night  COPD - transition to PO steroids, continue breathing treatments Dementia/Depression - agree with continuing home medications, discuss goals of care with family  Severe Protein Malnutrition - agree with resident plan as documented   Patient stable for transfer to med-surg floor  Donnella Sham MD

## 2013-12-28 NOTE — Progress Notes (Signed)
Speech Language Pathology Treatment: Dysphagia  Patient Details Name: Katelyn Rojas MRN: [id number] DOB: [birth year] Today's Date: 12/28/2013 Time: 0945-1000 SLP Time Calculation (min): 15 min  Assessment / Plan / Recommendation Clinical Impression  F/u from initial BSE to assess PO readiness.  Maintained oxygen saturations in upper 90's throughout treatment with respiratory rate lower 20's.  Administered nectar thick liquids by cup and spoon and puree consistency.  Continues with multiple swallows per cup sip and prolonged oral phase with puree consistency. Tolerated nectar thick liquids in small amounts by spoon.   Oral and pharyngeal dysphagia indicated mainly characterized by weakness. Recommend to initiate dysphagia 1 (puree) consistency with nectar thick liquids by spoon only with strict aspiration precautions as risk remains due to respiratory status.  Recommend to administer PO's at a slow rate and provide frequent rest breaks to increase safety.   Full supervision with all PO's due to patient presenting with decreased endurance and confusion.   ST to follow in acute care setting for diet tolerance and possible advancement.  Completion of objective evaluation to be determined.     HPI HPI: Katelyn Rojas is a 73 y.o. female presenting with Sepsis secondary to HCAP and acute respiratory failure. PMH is significant for severe COPD, chronic respiratory failure on home oxygen and BiPAP, hypertension, dementia, and diet-controlled DM-2.      SLP Plan  Goals updated    Recommendations Diet recommendations: Dysphagia 1 (puree);Nectar-thick liquid Liquids provided via: Teaspoon Medication Administration: Crushed with puree Supervision: Staff to assist with self feeding;Full supervision/cueing for compensatory strategies Compensations: Slow rate;Small sips/bites;Follow solids with liquid Postural Changes and/or Swallow Maneuvers: Seated upright 90 degrees;Upright 30-60 min after meal             Oral Care Recommendations: Oral care Q4 per protocol Follow up Recommendations: Skilled Nursing facility Plan: Goals updated    GO    [name] MS, CCC-SLP [phone] [facility] 12/28/2013, 10:42 AM

## 2013-12-28 NOTE — Progress Notes (Signed)
Family Medicine Teaching Service Daily Progress Note Intern Pager: [phone]  Patient name: Katelyn Rojas Medical record number: [id number] Date of birth: 07/14/1941 Age: 73 y.o. Gender: female  Primary Care Provider: No PCP Per Patient Consultants: None Code Status: Full  Pt Overview and Major Events to Date:  12/26/13 - admitted for acute resp failure, sepsis with RLL HCAP, on BiPAP, LA 0.45, Pro-calcitonin 0.10, WBC 16.8 12/27/13 - off BiPAP during day, SLP eval, afebrile, WBC 16.9  Assessment and Plan: Katelyn BruinDelsenia Roberti is a 73 y.o. female presenting with Sepsis secondary to HCAP and acute respiratory failure. PMH is significant for severe COPD, chronic respiratory failure on home oxygen and BiPAP, hypertension, dementia, and diet-controlled DM-2.  # Sepsis secondary to RLL HCAP - Stable Patient presented after recent multiple day hospitalization for PNA + COPD exac (DC'd 12/20/13) with decline at home. On admission to SDU, septic with tachycardia and hypoxia, identified source on CXR (new RLL infiltrate).  - Transfer out of SDU today  - monitor VS - Will transition to narrow spectrum ABx today or tomorrow (levaquin)       - Vancomycin IV (2/13>>)      - Aztreonam IV (2/13>>) - f/u daily CBC, WBC stable 16.8-->16.9> 12 - Blood cultures x 2 (collected 2/13 - pending)  # Acute on Chronic Respiratory failure, COPD H/o chronic lung disease with COPD, likely multifactorial and worsened secondary to HCAP - Decrease Solumedrol 125 mg to q 12 hrs, plan on PO if passing SLP today  - continue DuoNebs q 6 hr  # Chest Pain, suspect secondary to acute respiratory failure and co-morbidities - Resolved currently - Troponin initial and EKG negative   # Hx of HTN  - Stable currently, continues home meds  # Dementia and Hx of Depression  - Continuing home Aricept, IIoperidone, Perphenazine, and Seroquel   # Severe malnutrition in context of chronic illness, with cachexia and protein/calorie  malnutrition Significant nutritional concerns for inc nutritional demand in setting of chronic illness, poor wound healing, and high risk for re-feeding syndrome.  - Continuing Megace - c/s Nutrition - greatly appreciate recommendations     - F/U today SLP and diet accordingly      - If able to advance diet, recommend to supplement between meals with Glucerna Shakes PO BID, 30mL Prostat Liquid Protein PO BID, and MVI daily (if solid diet).     - consider 2 week therapy Zinc sulfate 220mg  daily + Vitamin C 500mg  BID for improved wound healing  # Sacral decubitus ulcer  - Wound VAC in place - consulted WOC, f/u recs  DM-2, diet controlled  - will monitor CBGs.  FEN/GI: D5NS w/ 20 KCl @ 75 mL/hr (x 72 hrs). NPO currently (pending bedside swallow, SLP).  Prophylaxis: Heparin SQ  Disposition: Transition out of SDU today, pending further improvement   Subjective: No acute events overnight, doing well   Objective: Temp:  [98.3 F (36.8 C)-100 F (37.8 C)] 99.3 F (37.4 C) (02/15 0424) Pulse Rate:  [76-105] 82 (02/15 0424) Resp:  [15-28] 20 (02/15 0424) BP: (100-143)/(42-66) 124/60 mmHg (02/15 0424) SpO2:  [92 %-100 %] 98 % (02/15 0743) FiO2 (%):  [50 %] 50 % (02/14 2041) Physical Exam: General: Chronically ill and cachetic HEENT: NCAT Cardiovascular: RRR Respiratory: Decreased, course breath sounds with bibasilar crackles R>L.  Abdomen: Soft/NT/ND, NABS Extremities: No LE edema, very thin ext, WWP.  Skin: Sacral decubitus ulcer with wound VAC and dressing in place.  Neuro: AAO x 3, no  focal deficits   Laboratory:  Recent Labs Lab 12/26/13 1130 12/27/13 0314 12/28/13 0335  WBC 16.8* 16.9* 12.7*  HGB 11.5* 10.0* 9.9*  HCT 37.8 33.0* 32.6*  PLT 275 274 266    Recent Labs Lab 12/26/13 1130 12/27/13 0314  NA 146 143  K 4.2 4.6  CL 100 99  CO2 36* 35*  BUN 25* 21  CREATININE 0.61 0.55  CALCIUM 9.2 8.8  GLUCOSE 87 179*   Imaging/Diagnostic Tests:  2/13  EKG Sinus tachycardia, HR 103, without evidence of ST-T wave changes  Briscoe Deutscher, DO 12/28/2013, 8:17 AM PGY-2, [facility] FPTS Intern pager: [phone], text pages welcome

## 2013-12-28 NOTE — Progress Notes (Signed)
NURSING PROGRESS NOTE  Katelyn Rojas [id number] Transfer Data: 12/28/2013 4:05 PM Attending Provider: Carney Living, MD PCP:No PCP Per Patient Code Status: full   Katelyn Rojas is a 74 y.o. female patient transferred from 2c   -No acute distress noted.  -No complaints of shortness of breath.  -No complaints of chest pain.   Cardiac Monitoring: Box # 21 in place. Cardiac monitor yields:normal sinus rhythm.  Blood pressure 126/54, pulse 71, temperature 97.9 F (36.6 C), temperature source Axillary, resp. rate 20, height 5\' 1"  (1.549 m), weight 39.4 kg (86 lb 13.8 oz), SpO2 93.00%.   IV Fluids:  IV in place, occlusive dsg intact without redness, IV cath forearm right, condition patent and no redness none.   Allergies:  Namenda and Penicillins  Past Medical History:   has a past medical history of Hypertension; COPD (chronic obstructive pulmonary disease); Arthritis; Oxygen dependent; Anxiety; Cachexia; and DM type 2 (diabetes mellitus, type 2).  Past Surgical History:   has past surgical history that includes Vaginal hysterectomy.  Social History:   reports that she has quit smoking. She does not have any smokeless tobacco history on file.  Skin: intact  Patient/Family orientated to room. Information packet given to patient/family. Admission inpatient armband information verified with patient/family to include name and date of birth and placed on patient arm. Side rails up x 2, fall assessment and education completed with patient/family. Patient/family able to verbalize understanding of risk associated with falls and verbalized understanding to call for assistance before getting out of bed. Call light within reach. Patient/family able to voice and demonstrate understanding of unit orientation instructions.    Will continue to evaluate and treat per MD orders.  Madelin Rear, MSN, RN, [employer]

## 2013-12-29 ENCOUNTER — Encounter (HOSPITAL_COMMUNITY): Payer: Self-pay | Admitting: Family Medicine

## 2013-12-29 LAB — GLUCOSE, CAPILLARY
Glucose-Capillary: 108 mg/dL — ABNORMAL HIGH (ref 70–99)
Glucose-Capillary: 154 mg/dL — ABNORMAL HIGH (ref 70–99)
Glucose-Capillary: 107 mg/dL — ABNORMAL HIGH (ref 70–99)
Glucose-Capillary: 114 mg/dL — ABNORMAL HIGH (ref 70–99)

## 2013-12-29 LAB — CBC WITH DIFFERENTIAL/PLATELET
BASOS ABS: 0 10*3/uL (ref 0.0–0.1)
BASOS PCT: 0 % (ref 0–1)
EOS ABS: 0 10*3/uL (ref 0.0–0.7)
Eosinophils Relative: 0 % (ref 0–5)
HEMATOCRIT: 33.3 % — AB (ref 36.0–46.0)
Hemoglobin: 10.4 g/dL — ABNORMAL LOW (ref 12.0–15.0)
Lymphocytes Relative: 8 % — ABNORMAL LOW (ref 12–46)
Lymphs Abs: 1 10*3/uL (ref 0.7–4.0)
MCH: 31 pg (ref 26.0–34.0)
MCHC: 31.2 g/dL (ref 30.0–36.0)
MCV: 99.1 fL (ref 78.0–100.0)
MONO ABS: 1.4 10*3/uL — AB (ref 0.1–1.0)
Monocytes Relative: 12 % (ref 3–12)
Neutro Abs: 9.3 10*3/uL — ABNORMAL HIGH (ref 1.7–7.7)
Neutrophils Relative %: 79 % — ABNORMAL HIGH (ref 43–77)
Platelets: 265 10*3/uL (ref 150–400)
RBC: 3.36 MIL/uL — ABNORMAL LOW (ref 3.87–5.11)
RDW: 12.9 % (ref 11.5–15.5)
WBC: 11.7 10*3/uL — ABNORMAL HIGH (ref 4.0–10.5)

## 2013-12-29 MED ORDER — ZINC SULFATE 220 (50 ZN) MG PO CAPS
220.0000 mg | ORAL_CAPSULE | Freq: Every day | ORAL | Status: DC
  Administered 2013-12-29 – 2013-12-31 (×3): 220 mg via ORAL
  Filled 2013-12-29 (×3): qty 1

## 2013-12-29 MED ORDER — VITAMIN C 500 MG/5ML PO SYRP
500.0000 mg | ORAL_SOLUTION | Freq: Two times a day (BID) | ORAL | Status: DC
  Administered 2013-12-29 – 2013-12-31 (×5): 500 mg via ORAL
  Filled 2013-12-29 (×6): qty 5

## 2013-12-29 MED ORDER — ADULT MULTIVITAMIN LIQUID CH
5.0000 mL | Freq: Every day | ORAL | Status: DC
  Administered 2013-12-29 – 2013-12-31 (×3): 5 mL via ORAL
  Filled 2013-12-29 (×3): qty 5

## 2013-12-29 MED ORDER — GLUCERNA SHAKE PO LIQD
237.0000 mL | Freq: Two times a day (BID) | ORAL | Status: DC
  Administered 2013-12-30 – 2013-12-31 (×3): 237 mL via ORAL

## 2013-12-29 MED ORDER — PRO-STAT SUGAR FREE PO LIQD
30.0000 mL | Freq: Two times a day (BID) | ORAL | Status: DC
  Administered 2013-12-30 – 2013-12-31 (×3): 30 mL via ORAL
  Filled 2013-12-29 (×5): qty 30

## 2013-12-29 MED ORDER — LEVOFLOXACIN 250 MG PO TABS
250.0000 mg | ORAL_TABLET | Freq: Every day | ORAL | Status: DC
  Filled 2013-12-29: qty 1

## 2013-12-29 MED ORDER — LEVOFLOXACIN 500 MG PO TABS
500.0000 mg | ORAL_TABLET | Freq: Once | ORAL | Status: AC
  Administered 2013-12-29: 500 mg via ORAL
  Filled 2013-12-29: qty 1

## 2013-12-29 MED ORDER — GLUCERNA PO LIQD: 237.0000 mL | Freq: Two times a day (BID) | ORAL | Status: DC

## 2013-12-29 NOTE — Progress Notes (Addendum)
Family Medicine Teaching Service Daily Progress Note Intern Pager: [phone]  Patient name: Katelyn Rojas Medical record number: [id number] Date of birth: 01/28/1941 Age: 73 y.o. Gender: female  Primary Care Provider: No PCP Per Patient Consultants: None Code Status: Full  Pt Overview and Major Events to Date:  12/26/13 - admitted for acute resp failure, sepsis with RLL HCAP, on BiPAP, LA 0.45, Pro-calcitonin 0.10, WBC 16.8 12/27/13 - off BiPAP during day, SLP eval, afebrile, WBC 16.9  Assessment and Plan: Katelyn BruinDelsenia Dekay is a 73 y.o. female presenting with Sepsis secondary to HCAP and acute respiratory failure. PMH is significant for severe COPD, chronic respiratory failure on home oxygen and BiPAP, hypertension, dementia, and diet-controlled DM-2.  # Sepsis secondary to RLL HCAP - Stable Patient presented after recent multiple day hospitalization for PNA + COPD exac (DC'd 12/20/13) with decline at home. On admission to SDU, septic with tachycardia and hypoxia, identified source on CXR (new RLL infiltrate).  - Transfer out of SDU today  - monitor VS - Will transition to narrow spectrum ABx today or tomorrow (levaquin)       - Vancomycin IV (2/13>>)      - Aztreonam IV (2/13>>) - f/u daily CBC, WBC stable 16.8-->16.9> 12 - Blood cultures x 2 (collected 2/13 - pending)  # Acute on Chronic Respiratory failure, COPD H/o chronic lung disease with COPD, likely multifactorial and worsened secondary to HCAP - Decrease Solumedrol 125 mg to q 12 hrs, plan on PO if passing SLP today  - continue DuoNebs q 6 hr  # Chest Pain, suspect secondary to acute respiratory failure and co-morbidities - Resolved currently - Troponin initial and EKG negative   # Hx of HTN  - Stable currently, continues home meds  # Dementia and Hx of Depression  - Continuing home Aricept, IIoperidone, Perphenazine, and Seroquel   # Severe malnutrition in context of chronic illness, with cachexia and protein/calorie  malnutrition Significant nutritional concerns for inc nutritional demand in setting of chronic illness, poor wound healing, and high risk for re-feeding syndrome.  - Continuing Megace - c/s Nutrition - greatly appreciate recommendations     - F/U today SLP and diet accordingly      - If able to advance diet, recommend to supplement between meals with Glucerna Shakes PO BID, 30mL Prostat Liquid Protein PO BID, and MVI daily (if solid diet).     - consider 2 week therapy Zinc sulfate 220mg  daily + Vitamin C 500mg  BID for improved wound healing  # Sacral decubitus ulcer  - Wound VAC in place - consulted WOC, f/u recs  DM-2, diet controlled  - will monitor CBGs.  FEN/GI: D5NS w/ 20 KCl @ 75 mL/hr (x 72 hrs). NPO currently (pending bedside swallow, SLP).  Prophylaxis: Heparin SQ  Disposition: Transition out of SDU today, pending further improvement   Subjective: No acute events overnight. No family at bedside this AM. Patient on 2L [place] O2. Overall, she is doing better and asks "why am I still here?" and is ready to go home. States her niece will be back later today. Tolerating diet, good UOP and BM.  Denies fever/chills, HA, CP, n/v.  Objective: Temp:  [97.9 F (36.6 C)-98.9 F (37.2 C)] 98.9 F (37.2 C) (02/16 0408) Pulse Rate:  [71-108] 97 (02/16 0408) Resp:  [14-20] 20 (02/16 0408) BP: (122-130)/(54-71) 130/71 mmHg (02/16 0408) SpO2:  [91 %-98 %] 94 % (02/16 0753) Physical Exam: General: Chronically ill and cachetic HEENT: NCAT Cardiovascular: RRR Respiratory: Mild  improved air movement overall with persistent coarse breath sounds with bibasilar crackles R>L.  Abdomen: Soft/NT/ND, NABS Extremities: No LE edema, very thin ext, WWP.  Skin: Sacral decubitus ulcer with wound VAC and dressing in place.  Neuro: AAO x 3, no focal deficits   Laboratory:  Recent Labs Lab 12/27/13 0314 12/28/13 0335 12/29/13 0655  WBC 16.9* 12.7* 11.7*  HGB 10.0* 9.9* 10.4*  HCT 33.0* 32.6*  33.3*  PLT 274 266 265    Recent Labs Lab 12/26/13 1130 12/27/13 0314  NA 146 143  K 4.2 4.6  CL 100 99  CO2 36* 35*  BUN 25* 21  CREATININE 0.61 0.55  CALCIUM 9.2 8.8  GLUCOSE 87 179*   Imaging/Diagnostic Tests:  2/13 EKG Sinus tachycardia, HR 103, without evidence of ST-T wave changes  Saralyn Pilar, DO 12/29/2013, 8:18 AM PGY-1, [facility] FPTS Intern pager: [phone], text pages welcome   Attending Addendum  I examined the patient and discussed the assessment and plan with Dr. Althea Charon. I have reviewed the note and agree.  Patient is clinically improving from PNA. Has some baseline dementia with acute delirium.  Of note patient lives with her niece, [name], she currently receives home wound care. She recently received home health PT but was not able to complete PT due to fatigue and SOB.   I spoke to her niece at length on the phone. She is adamant that she does not want SNF placement post-hospitalization. She does want all home health services available. She believes her aunt was sent home too early following her last hospitalization and thus the reason for readmission. She would like her to stay in the hospital until she is recovered. I informed her that staying until complete recovery will not be possible. I recommend short term SNF as an option, she declined.  Plan is to take advantage of all available home health services. PT/OT consulted and to keep patient in house until we have documented continued clinical improvement on an oral regimen. Switching to PO Levaquin today. Anticipate d/c to home in 2 days, Wednesday 12/31/13. [name] was amenable to this.   [name] informed me that Mrs. Zehnder's Rojo Air Cushion pillow was misplaced in the ED. The cost to replace will be $500. I informed her that the unit secretary could best direct her on how to address misplaced items.     Dessa Phi, MD FAMILY MEDICINE TEACHING SERVICE

## 2013-12-29 NOTE — Consult Note (Signed)
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 Wound type: Pressure ulcer, present on admission.  Pressure Ulcer POA: Yes Measurement: 4 cm x 4 cm x 0.5 cm with undermining extending back 0.3 cm circumferentially. Wound bed: 100% pale pink Drainage (amount, consistency, odor) Moderate serosanguinous drainage.   Periwound: Intact. Dressing procedure/placement/frequency: Wound cleansed with NS and pat gently dry.  Apply Granufoam dressing to wound bed and bridge over to left side to offload pressure.  125 mmHg, continuous pressure.  WOC nursing will continue to follow patient at this time.  Maple Hudson RN BSN CWON Pager [phone]

## 2013-12-29 NOTE — Progress Notes (Signed)
Attending Addendum  I examined the patient and discussed the assessment and plan with Dr. Althea Charon. I have reviewed the note and agree.   Patient is clinically improving from PNA. Has some baseline dementia with acute delirium.   Of note patient lives with her niece, [name], she currently receives home wound care. She recently received home health PT but was not able to complete PT due to fatigue and SOB.   I spoke to her niece at length on the phone. She is adamant that she does not want SNF placement post-hospitalization. She does want all home health services available. She believes her aunt was sent home too early following her last hospitalization and thus the reason for readmission. She would like her to stay in the hospital until she is recovered. I informed her that staying until complete recovery will not be possible. I recommend short term SNF as an option, she declined.  Plan is to take advantage of all available home health services. PT/OT consulted and to keep patient in house until we have documented continued clinical improvement on an oral regimen. Switching to PO Levaquin today. Anticipate d/c to home in 2 days, Wednesday 12/31/13. [name] was amenable to this.   [name] informed me that Mrs. Lycan's Rojo Air Cushion pillow was misplaced in the ED. The cost to replace will be $500. I informed her that the unit secretary could best direct her on how to address misplaced items.   Dessa Phi, MD FAMILY MEDICINE TEACHING SERVICE

## 2013-12-29 NOTE — Evaluation (Signed)
Physical Therapy Evaluation Patient Details Name: Katelyn Rojas MRN: [id number] DOB: 14-Oct-1941 Today's Date: 12/29/2013 Time: 7628-3151 PT Time Calculation (min): 18 min  PT Assessment / Plan / Recommendation History of Present Illness  Pt adm with resp failure due to PNA. PMH is significant for severe COPD, chronic respiratory failure on home oxygen and BiPAP, hypertension, dementia, and diet-controlled DM-2.  Clinical Impression  Pt admitted with above. Pt currently with functional limitations due to the deficits listed below (see PT Problem List).  Pt will benefit from skilled PT to increase their independence and safety with mobility to allow discharge to the venue listed below.       PT Assessment  Patient needs continued PT services    Follow Up Recommendations  Home health PT;Supervision/Assistance - 24 hour    Does the patient have the potential to tolerate intense rehabilitation      Barriers to Discharge        Equipment Recommendations  None recommended by PT    Recommendations for Other Services     Frequency Min 3X/week    Precautions / Restrictions Precautions Precautions: Fall   Pertinent Vitals/Pain Dyspnea 3/4      Mobility  Transfers Overall transfer level: Needs assistance Equipment used: Rolling walker (2 wheeled) Transfers: Sit to/from Stand Sit to Stand: Mod assist General transfer comment: Assist to bring hips up    Exercises     PT Diagnosis: Difficulty walking;Generalized weakness  PT Problem List: Decreased strength;Decreased activity tolerance;Decreased balance;Decreased mobility;Cardiopulmonary status limiting activity PT Treatment Interventions: DME instruction;Gait training;Functional mobility training;Therapeutic exercise;Balance training;Therapeutic activities;Patient/family education     PT Goals(Current goals can be found in the care plan section) Acute Rehab PT Goals Patient Stated Goal: go home PT Goal Formulation: With  patient Time For Goal Achievement: 01/05/14 Potential to Achieve Goals: Fair  Visit Information  Last PT Received On: 12/29/13 Assistance Needed: +1 History of Present Illness: Pt adm with resp failure due to PNA. PMH is significant for severe COPD, chronic respiratory failure on home oxygen and BiPAP, hypertension, dementia, and diet-controlled DM-2.       Prior Functioning  Home Living Family/patient expects to be discharged to:: Private residence Living Arrangements: Children;Other relatives Available Help at Discharge: Family Type of Home: House Home Access: Stairs to enter Entrance Stairs-Number of Steps: 2-3 Home Layout: Two level;Able to live on main level with bedroom/bathroom Home Equipment: Dan Humphreys - 2 wheels;Cane - single point;Wheelchair - manual Prior Function Level of Independence: Needs assistance Gait / Transfers Assistance Needed: Per pt she amb without assistance.    Cognition  Cognition Arousal/Alertness: Awake/alert Behavior During Therapy: WFL for tasks assessed/performed Overall Cognitive Status: No family/caregiver present to determine baseline cognitive functioning    Extremity/Trunk Assessment Upper Extremity Assessment Upper Extremity Assessment: Defer to OT evaluation Lower Extremity Assessment Lower Extremity Assessment: Generalized weakness   Balance Balance Overall balance assessment: Needs assistance Standing balance support: Bilateral upper extremity supported Standing balance-Leahy Scale: Poor Standing balance comment: Pt stood x 30 sec with mod A due to initial posterior lean.  Dyspnea 3/4.  End of Session PT - End of Session Activity Tolerance: Patient limited by fatigue Patient left: in chair;with call bell/phone within reach Nurse Communication: Mobility status  GP     [facility] 12/29/2013, 3:24 PM  [facility] PT [phone]

## 2013-12-29 NOTE — Progress Notes (Signed)
Speech Language Pathology Treatment: Dysphagia  Patient Details Name: Katelyn Rojas MRN: [id number] DOB: 1941-03-19 Today's Date: 12/29/2013 Time: 4665-9935 SLP Time Calculation (min): 16 min  Assessment / Plan / Recommendation Clinical Impression  Pt appearing very frail, cough ineffective to protect airway. Pt continues to demonstrate subtle signs of dysphagia and possibly silent aspiration with thin liquids. Feel pt would benefit from MBS to determine safety with PO and need for modified diet. Pt may continue current diet for now.    HPI HPI: Katelyn Rojas is a 73 y.o. female presenting with Sepsis secondary to HCAP and acute respiratory failure. PMH is significant for severe COPD, chronic respiratory failure on home oxygen and BiPAP, hypertension, dementia, and diet-controlled DM-2.   Pertinent Vitals NA  SLP Plan  MBS    Recommendations Diet recommendations: Dysphagia 1 (puree);Nectar-thick liquid Liquids provided via: Teaspoon Medication Administration: Crushed with puree Supervision: Staff to assist with self feeding;Full supervision/cueing for compensatory strategies Compensations: Slow rate;Small sips/bites;Follow solids with liquid Postural Changes and/or Swallow Maneuvers: Seated upright 90 degrees;Upright 30-60 min after meal              Oral Care Recommendations: Oral care BID Follow up Recommendations: Skilled Nursing facility Plan: MBS    GO    Harlon Ditty, [place] CCC-SLP [phone]  Claudine Mouton 12/29/2013, 12:08 PM

## 2013-12-29 NOTE — Progress Notes (Signed)
Family Medicine Teaching Service Daily Progress Note Intern Pager: [phone]  Patient name: Katelyn Rojas Medical record number: [id number] Date of birth: 03/14/1941 Age: 73 y.o. Gender: female  Primary Care Provider: No PCP Per Patient Consultants: None Code Status: Full  Pt Overview and Major Events to Date:  12/26/13 - admitted for acute resp failure, sepsis with RLL HCAP, on BiPAP, LA 0.45, Pro-calcitonin 0.10, WBC 16.8 12/27/13 - off BiPAP during day, SLP eval, afebrile, WBC 16.9 12/28/13 - transferred to floor 12/29/13 - WBC 12.7-->11.7, Procalcitonin 0.10, DC'd Vanc / Aztreonam --> start Levaquin PO  Assessment and Plan: Katelyn BruinDelsenia Gellert is a 73 y.o. female presenting with Sepsis secondary to HCAP and acute respiratory failure. PMH is significant for severe COPD, chronic respiratory failure on home oxygen and BiPAP, hypertension, dementia, and diet-controlled DM-2.  # Sepsis secondary to RLL HCAP - Improved Patient presented after recent multiple day hospitalization for PNA + COPD exac (DC'd 12/20/13) with decline at home. On admission to SDU, septic with tachycardia and hypoxia, identified source on CXR (new RLL infiltrate). Transferred out to regular floor on 12/28/13. - monitor VS - Will transition to narrow spectrum ABx today:       - DC'd Vancomycin IV (2/13>>2/16)      - DC'd Aztreonam IV (2/13>>2/16)      - start Levaquin PO 500mg  daily, expect to complete 10 day course total abx (additionally, DC'd PO iron while receiving FQ antibiotics) - f/u daily CBC, WBC stable 16.8-->16.9-->12-->11.7 - Blood cultures x 2 (collected 2/13 - NGTD) - procalcitonin stable w/o inc at 0.10  # Acute on Chronic Respiratory failure, COPD - Improved H/o chronic lung disease with COPD, likely multifactorial and worsened secondary to HCAP - currently stable on 2-3L [place] during day, cont home BiPAP at night and appears at baseline mentation - start Prednisone 50mg  PO today complete short burst -  continue DuoNebs TID scheduled, with PRN  # Chest Pain, suspect secondary to acute respiratory failure and co-morbidities - Resolved currently - Troponin negative and EKG negative   # Hx of HTN  - Stable currently, continues home meds  # Dementia and Hx of Depression  - Continuing home Aricept, IIoperidone, Perphenazine, and Seroquel - appears at baseline, no new changes in mental status at this time  # Severe malnutrition in context of chronic illness, with cachexia and protein/calorie malnutrition Significant nutritional concerns for inc nutritional demand in setting of chronic illness, poor wound healing, and high risk for re-feeding syndrome.  - Continuing Megace - c/s Nutrition - greatly appreciate recommendations     - per SLP - dysphagia 1 diet with MBS     - If able to advance diet, recommend to supplement between meals with Glucerna Shakes PO BID, 30mL Prostat Liquid Protein PO BID, and MVI daily (if solid diet).     - consider 2 week therapy Zinc sulfate 220mg  daily + Vitamin C 500mg  BID for improved wound healing  # Sacral decubitus ulcer  - Wound VAC in place - consulted WOC, f/u recs  DM-2, diet controlled  - will monitor CBGs.  FEN/GI: SLIV. Dysphagia Diet 1 with nectar thick liquids Prophylaxis: Heparin SQ  Disposition: Transferred from SDU to regular floor on 12/28/13, pending continued clinical improvement and resp status for HCAP, pending eval by PT for possible SNF placement, expect to remain hospitalized for 1-3 more days. - will attempt to discuss disposition with niece today   Subjective: No acute events overnight. No family at bedside this AM.  Patient on 2L [place] O2. Overall, she is doing better and asks "why am I still here?" and is ready to go home. States her niece will be back later today. Tolerating diet, good UOP and BM.  Denies fever/chills, HA, CP, n/v.  Objective: Temp:  [97.9 F (36.6 C)-98.9 F (37.2 C)] 98.9 F (37.2 C) (02/16 0408) Pulse  Rate:  [71-102] 97 (02/16 0408) Resp:  [20] 20 (02/16 0408) BP: (126-130)/(54-71) 130/71 mmHg (02/16 0408) SpO2:  [92 %-98 %] 94 % (02/16 0753) Physical Exam: General: Chronically ill and cachetic HEENT: NCAT Cardiovascular: RRR Respiratory: Mild improved air movement overall with persistent coarse breath sounds with bibasilar crackles R>L.  Abdomen: Soft/NT/ND, NABS Extremities: No LE edema, very thin ext, WWP.  Skin: Sacral decubitus ulcer with wound VAC and dressing in place.  Neuro: AAO x 3, no focal deficits   Laboratory:  Recent Labs Lab 12/27/13 0314 12/28/13 0335 12/29/13 0655  WBC 16.9* 12.7* 11.7*  HGB 10.0* 9.9* 10.4*  HCT 33.0* 32.6* 33.3*  PLT 274 266 265    Recent Labs Lab 12/26/13 1130 12/27/13 0314  NA 146 143  K 4.2 4.6  CL 100 99  CO2 36* 35*  BUN 25* 21  CREATININE 0.61 0.55  CALCIUM 9.2 8.8  GLUCOSE 87 179*   Imaging/Diagnostic Tests:  2/13 CXR 2v IMPRESSION:  1. New infiltrate at the right lung base. This is superimposed on  severe chronic lung disease.  2. Multiple compression fractures in the mid thoracic spine,  increased since 12/14/2013.  2/13 EKG Sinus tachycardia, HR 103, without evidence of ST-T wave changes  Saralyn Pilar, DO 12/29/2013, 2:24 PM PGY-1, [facility] FPTS Intern pager: [phone], text pages welcome

## 2013-12-29 NOTE — Progress Notes (Signed)
Pts family placed pt on home Bipap for the night.

## 2013-12-30 ENCOUNTER — Inpatient Hospital Stay (HOSPITAL_COMMUNITY): Payer: Medicare Other

## 2013-12-30 LAB — CBC WITH DIFFERENTIAL/PLATELET
Basophils Absolute: 0 10*3/uL (ref 0.0–0.1)
Basophils Relative: 0 % (ref 0–1)
EOS ABS: 0 10*3/uL (ref 0.0–0.7)
EOS PCT: 0 % (ref 0–5)
HCT: 34 % — ABNORMAL LOW (ref 36.0–46.0)
HEMOGLOBIN: 10.6 g/dL — AB (ref 12.0–15.0)
Lymphocytes Relative: 13 % (ref 12–46)
Lymphs Abs: 1 10*3/uL (ref 0.7–4.0)
MCH: 30.7 pg (ref 26.0–34.0)
MCHC: 31.2 g/dL (ref 30.0–36.0)
MCV: 98.6 fL (ref 78.0–100.0)
MONO ABS: 1.2 10*3/uL — AB (ref 0.1–1.0)
MONOS PCT: 15 % — AB (ref 3–12)
Neutro Abs: 5.8 10*3/uL (ref 1.7–7.7)
Neutrophils Relative %: 72 % (ref 43–77)
Platelets: 285 10*3/uL (ref 150–400)
RBC: 3.45 MIL/uL — ABNORMAL LOW (ref 3.87–5.11)
RDW: 13 % (ref 11.5–15.5)
WBC: 8.1 10*3/uL (ref 4.0–10.5)

## 2013-12-30 LAB — GLUCOSE, CAPILLARY
GLUCOSE-CAPILLARY: 148 mg/dL — AB (ref 70–99)
Glucose-Capillary: 132 mg/dL — ABNORMAL HIGH (ref 70–99)
Glucose-Capillary: 116 mg/dL — ABNORMAL HIGH (ref 70–99)
Glucose-Capillary: 70 mg/dL (ref 70–99)

## 2013-12-30 MED ORDER — LEVOFLOXACIN 250 MG PO TABS
250.0000 mg | ORAL_TABLET | Freq: Once | ORAL | Status: AC
  Administered 2013-12-30: 250 mg via ORAL
  Filled 2013-12-30: qty 1

## 2013-12-30 MED ORDER — LEVOFLOXACIN 750 MG PO TABS
750.0000 mg | ORAL_TABLET | ORAL | Status: DC
  Filled 2013-12-30: qty 1

## 2013-12-30 NOTE — Progress Notes (Signed)
Attending Addendum  I examined the patient and discussed the assessment and plan with Dr. Malachi Paradise. I have reviewed the note and agree.  Patient continues to clinically improve. Arranging disposition and managing expectations with patient's niece. Patient has wound care and pulm f/u in the next week. Anticipate d/c tomorrow. [name] niece is working with hospital staff regarding a misplaced Oceanographer.     Dessa Phi, MD FAMILY MEDICINE TEACHING SERVICE

## 2013-12-30 NOTE — Progress Notes (Addendum)
Hypoglycemic Event  CBG: 70  Treatment: 15 GM carbohydrate snack  Symptoms: None  Follow-up CBG: Time: 11:23 CBG Result: 116  Possible Reasons for Event: Inadequate meal intake  Comments/MD notified:Funches, MD 11:00  MD time call back: 11:02    Katelyn Rojas L  Remember to initiate Hypoglycemia Order Set & complete

## 2013-12-30 NOTE — Progress Notes (Signed)
Family Medicine Teaching Service Daily Progress Note Intern Pager: [phone]  Patient name: Katelyn Rojas Medical record number: [id number] Date of birth: 1941-08-24 Age: 73 y.o. Gender: female  Primary Care Provider: No PCP Per Patient Consultants: None Code Status: Full  Pt Overview and Major Events to Date:  12/26/13 - admitted for acute resp failure, sepsis with RLL HCAP, on BiPAP, LA 0.45, Pro-calcitonin 0.10, WBC 16.8 12/27/13 - off BiPAP during day, SLP eval, afebrile, WBC 16.9 12/28/13 - transferred to floor 12/29/13 - WBC 12.7-->11.7, Procalcitonin 0.10, DC'd Vanc / Aztreonam --> start Levaquin PO 12/30/13 - WBC 11.7-->8.1  Assessment and Plan: Katelyn Rojas is a 73 y.o. female presenting with Sepsis secondary to HCAP and acute respiratory failure. PMH is significant for severe COPD, chronic respiratory failure on home oxygen and BiPAP, hypertension, dementia, and diet-controlled DM-2.  # Sepsis secondary to RLL HCAP - Improved Patient presented after recent multiple day hospitalization for PNA + COPD exac (DC'd 12/20/13) with decline at home. On admission to SDU, septic with tachycardia and hypoxia, identified source on CXR (new RLL infiltrate). Transferred out to regular floor on 12/28/13. - monitor VS, on telemetry - Continue narrowed antibiotics      - Continue Levaquin PO 750mg  q 48h (2/18>>) ( (CrCL <50 per pharm), expect 10 day course total abx (DC'd PO iron while receiving FQ antibiotics, also space 2 hrs apart from vitamins / antacids). To receive 250mg  tonight after receiving 500mg  yesterday.      - DC'd Vancomycin IV (2/13>>2/16)      - DC'd Aztreonam IV (2/13>>2/16) - f/u daily CBC, WBC improving 16.8>>8.1 - Blood cultures x 2 (collected 2/13 - NGTD on 2/17) - procalcitonin stable w/o inc at 0.10  # Acute on Chronic Respiratory failure, COPD - Improved H/o chronic lung disease with COPD, likely multifactorial and worsened secondary to HCAP - currently stable on  2-3L [place] during day, cont home BiPAP at night and appears at baseline mentation - continue Prednisone 50mg  PO (2/13>>), h/o chronic Prednisone 10mg  daily (since 03/2013), gradual taper 1-2 weeks to return to baseline daily dose - continue DuoNebs TID scheduled, with PRN  # Chest Pain, suspect secondary to acute respiratory failure and co-morbidities - Resolved currently - Troponin negative and EKG negative   # Hx of HTN  - Stable currently, continues home meds  # Dementia and Hx of Depression  - Continuing home Aricept, IIoperidone, Perphenazine, and Seroquel - appears at baseline, no new changes in mental status at this time - repeat EKG today, (prior with normal QTc), inc risk d/t psychiatric meds + Levaquin  # Severe malnutrition in context of chronic illness, with cachexia and protein/calorie malnutrition Significant nutritional concerns for inc nutritional demand in setting of chronic illness, poor wound healing, and high risk for re-feeding syndrome.  - Continuing Megace - c/s Nutrition - greatly appreciate recommendations     - If able to advance diet, recommend to supplement between meals with Glucerna Shakes PO BID, 59mL Prostat Liquid Protein PO BID, and MVI daily (if solid diet).     - consider 2 week therapy Zinc sulfate 220mg  daily + Vitamin C 500mg  BID for improved wound healing  # Dysphagia, primary oral dysphagia complicated by respiratory fatigue - SLP following: (see detailed note)       - Recommend Dysphagia 2, nectar thickened liquids       - ordered MBS (12/30/13) - Rx nectar thickener  # Sacral decubitus ulcer  - Wound VAC in place -  consulted WOC, f/u recs  DM-2, diet controlled  - will monitor CBGs (100-120s)  FEN/GI: SLIV. Dysphagia Diet 1 with nectar thick liquids Prophylaxis: Heparin SQ  Disposition: Transferred from SDU to regular floor on 12/28/13, pending continued clinical improvement and resp status for HCAP, pending eval by PT for possible SNF  placement, expect to remain hospitalized for 1-2 more days. - Discharge plans discussed with niece [name]([name]) who declined recommended short-term SNF option, [facility] likely on Wednesday 2/18 if clinically improved.   Subjective: No acute events overnight. Niece ([name]) at bedside this AM. Patient remains on 2L O2 [place]. Patient reports that she is "so-so" today and c/o of generalized abd / chest / back pain, her niece reports that these are frequent complaints and usually takes Tramadol / Naproxen with some relief at home. Otherwise her resp status appears improved, continues BiPAP at night. Good UOP and BM, tolerating diet (hold this AM for MBS). Agreeable to potential discharge [facility].  Objective: Temp:  [98.7 F (37.1 C)-99.3 F (37.4 C)] 99.3 F (37.4 C) (02/17 0535) Pulse Rate:  [95-106] 98 (02/17 0535) Resp:  [18-20] 20 (02/17 0535) BP: (98-147)/(58-86) 147/86 mmHg (02/17 0535) SpO2:  [91 %-100 %] 92 % (02/17 0535) Physical Exam: General: Chronically ill and cachetic, NAD, 2L [place] HEENT: NCAT, mil dry MM Chest: mild reproducible TTP generalized Cardiovascular: RRR Respiratory: Persistent bibasilar crackles R>L (mild improvement), stable air movement overall. Some pursed lip breathing, no significant inc WOB or resp distress. Conversational with short phrases. Abdomen: Soft, mild generalized TTP, ND, +active BS Extremities: No LE edema, very thin ext, WWP.  Skin: Sacral decubitus ulcer with wound VAC and dressing in place.  Neuro: AAO x 3, no focal deficits   Laboratory:  Recent Labs Lab 12/28/13 0335 12/29/13 0655 12/30/13 0511  WBC 12.7* 11.7* 8.1  HGB 9.9* 10.4* 10.6*  HCT 32.6* 33.3* 34.0*  PLT 266 265 285    Recent Labs Lab 12/26/13 1130 12/27/13 0314  NA 146 143  K 4.2 4.6  CL 100 99  CO2 36* 35*  BUN 25* 21  CREATININE 0.61 0.55  CALCIUM 9.2 8.8  GLUCOSE 87 179*   2/14 ABG    Component Value  Date/Time   PHART 7.400 12/27/2013 1740   PCO2ART 61.3* 12/27/2013 1740   PO2ART 71.9* 12/27/2013 1740   HCO3 37.2* 12/27/2013 1740   TCO2 39.1 12/27/2013 1740   O2SAT 95.3 12/27/2013 1740   Troponin-I - negative Pro-BNP - 130.4 Procalcitonin - < 0.10  UA - (neg nit, neg leuk, not suggestive of infection) BCx x 2 (collected 2/13 >> NGTD)  Imaging/Diagnostic Tests:  2/13 CXR 2v IMPRESSION:  1. New infiltrate at the right lung base. This is superimposed on  severe chronic lung disease.  2. Multiple compression fractures in the mid thoracic spine,  increased since 12/14/2013.  2/13 EKG Sinus tachycardia, HR 103, without evidence of ST-T wave changes  2/17 MBS  Saralyn PilarAlexander Tayra Dawe, DO 12/30/2013, 12:29 PM PGY-1, [facility] FPTS Intern pager: [phone], text pages welcome

## 2013-12-30 NOTE — Procedures (Signed)
Objective Swallowing Evaluation: Bedside swallow evaluation  Patient Details  Name: Katelyn Rojas MRN: [id number] Date of Birth: 02/11/1941  Today's Date: 12/30/2013 Time: [id number] SLP Time Calculation (min): 30 min  Past Medical History:  Past Medical History  Diagnosis Date  . Hypertension   . COPD (chronic obstructive pulmonary disease)   . Arthritis   . Oxygen dependent   . Anxiety   . Cachexia   . DM type 2 (diabetes mellitus, type 2)    Past Surgical History:  Past Surgical History  Procedure Laterality Date  . Vaginal hysterectomy      ? Abdominal vs. Vaginal   HPI:  Katelyn Rojas is a 73 y.o. female presenting with Sepsis secondary to HCAP and acute respiratory failure. PMH is significant for severe COPD, chronic respiratory failure on home oxygen and BiPAP, hypertension, dementia, and diet-controlled DM-2.     Assessment / Plan / Recommendation Clinical Impression  Dysphagia Diagnosis: Moderate oral phase dysphagia;Mild pharyngeal phase dysphagia Clinical impression: Pt demonstrates a primary oral dysphagia complicated by respiratory fatigue with PO intake and weak cough response. The pt has a very slow oral mechanism with weak lingual pumping to slowly transit bolus in piecemeal fashion. This leads to premature spillage to pyriforms with thin liquids. Delay in swallow progressively increases with time and respiratory fatigue, and shortness of breath quickly begins after 2 swallows. The pt has intermittent sensation of aspiration and even with cough attempt does not have the force to expel penetrate or aspirate. Pts pharyngeal strength is actually quite good. For now recommend dys 2 (fine chop) solids and nectar thick liquids gien via cup or straw. Perhaps with improvement in respiratory function or endurance pt would be capable to upgrade. Needs stronger cough.     Treatment Recommendation  Therapy as outlined in treatment plan below    Diet Recommendation  Dysphagia 2 (Fine chop);Nectar-thick liquid   Liquid Administration via: Cup;Straw Medication Administration: Crushed with puree Supervision: Staff to assist with self feeding;Full supervision/cueing for compensatory strategies Compensations: Slow rate;Small sips/bites;Follow solids with liquid Postural Changes and/or Swallow Maneuvers: Seated upright 90 degrees;Upright 30-60 min after meal    Other  Recommendations Oral Care Recommendations: Oral care BID Other Recommendations: Order thickener from pharmacy   Follow Up Recommendations  Skilled Nursing facility    Frequency and Duration min 2x/week  2 weeks   Pertinent Vitals/Pain NA    SLP Swallow Goals     General HPI: Katelyn Rojas is a 73 y.o. female presenting with Sepsis secondary to HCAP and acute respiratory failure. PMH is significant for severe COPD, chronic respiratory failure on home oxygen and BiPAP, hypertension, dementia, and diet-controlled DM-2. Type of Study: Bedside swallow evaluation Reason for Referral: Objectively evaluate swallowing function Previous Swallow Assessment: BSE Diet Prior to this Study: Dysphagia 1 (puree);Nectar-thick liquids Temperature Spikes Noted: No Respiratory Status: Nasal cannula History of Recent Intubation: No Behavior/Cognition: Alert;Cooperative;Confused Oral Cavity - Dentition: Poor condition;Missing dentition Oral Motor / Sensory Function: Impaired - see Bedside swallow eval Self-Feeding Abilities: Needs assist Patient Positioning: Upright in chair Baseline Vocal Quality: Low vocal intensity Volitional Cough: Weak;Wet;Congested Volitional Swallow: Able to elicit Anatomy: Within functional limits Pharyngeal Secretions: Not observed secondary MBS    Reason for Referral Objectively evaluate swallowing function   Oral Phase Oral Preparation/Oral Phase Oral Phase: Impaired Oral - Nectar Oral - Nectar Cup: Weak lingual manipulation;Lingual pumping;Incomplete tongue to  palate contact;Reduced posterior propulsion;Piecemeal swallowing;Delayed oral transit Oral - Nectar Straw: Weak lingual manipulation;Lingual pumping;Incomplete tongue to palate  contact;Reduced posterior propulsion;Piecemeal swallowing;Delayed oral transit Oral - Thin Oral - Thin Cup: Weak lingual manipulation;Lingual pumping;Incomplete tongue to palate contact;Reduced posterior propulsion;Piecemeal swallowing;Delayed oral transit Oral - Thin Straw: Weak lingual manipulation;Lingual pumping;Incomplete tongue to palate contact;Reduced posterior propulsion;Piecemeal swallowing;Delayed oral transit Oral - Solids Oral - Puree: Weak lingual manipulation;Lingual pumping;Incomplete tongue to palate contact;Reduced posterior propulsion;Piecemeal swallowing;Delayed oral transit Oral - Mechanical Soft: Weak lingual manipulation;Lingual pumping;Incomplete tongue to palate contact;Reduced posterior propulsion;Piecemeal swallowing;Delayed oral transit Oral - Pill: Other (Comment) (pt could not keep whole pill with puree bolus to transit)   Pharyngeal Phase Pharyngeal Phase Pharyngeal Phase: Impaired Pharyngeal - Nectar Pharyngeal - Nectar Cup: Premature spillage to pyriform sinuses;Premature spillage to valleculae;Delayed swallow initiation Pharyngeal - Nectar Straw: Delayed swallow initiation;Premature spillage to valleculae;Premature spillage to pyriform sinuses Pharyngeal - Thin Pharyngeal - Thin Cup: Delayed swallow initiation;Premature spillage to valleculae;Premature spillage to pyriform sinuses;Penetration/Aspiration before swallow;Trace aspiration Penetration/Aspiration details (thin cup): Material enters airway, CONTACTS cords and not ejected out Pharyngeal - Thin Straw: Delayed swallow initiation;Premature spillage to valleculae;Premature spillage to pyriform sinuses;Penetration/Aspiration before swallow;Trace aspiration Penetration/Aspiration details (thin straw): Material enters airway, CONTACTS  cords and not ejected out Pharyngeal - Solids Pharyngeal - Puree: Delayed swallow initiation;Premature spillage to valleculae Pharyngeal - Mechanical Soft: Delayed swallow initiation;Premature spillage to valleculae Pharyngeal - Pill: Not tested  Cervical Esophageal Phase    GO    Cervical Esophageal Phase Cervical Esophageal Phase: Impaired Cervical Esophageal Phase - Comment Cervical Esophageal Comment: Appearance of tight UES        Harlon Ditty, MA CCC-SLP [phone]  Claudine Mouton 12/30/2013, 10:35 AM

## 2013-12-30 NOTE — Evaluation (Signed)
Occupational Therapy Evaluation Patient Details Name: Katelyn Rojas MRN: [id number] DOB: [birth year] Today's Date: 12/30/2013 Time: 2706-2376 OT Time Calculation (min): 27 min  OT Assessment / Plan / Recommendation History of present illness Pt adm with resp failure due to PNA. PMH is significant for severe COPD, chronic respiratory failure on home oxygen and BiPAP, hypertension, dementia, and diet-controlled DM-2.   Clinical Impression   Pt demos decline in function with ADLs and ADL mobility safety and would benefit from acute OT services to address impairments to increase level of function and safety. Pt will have 24 hr assist at home as she currently does per pt    OT Assessment  Patient needs continued OT Services    Follow Up Recommendations  Supervision/Assistance - 24 hour    Barriers to Discharge   none  Equipment Recommendations  None recommended by OT    Recommendations for Other Services    Frequency  Min 2X/week    Precautions / Restrictions Precautions Precautions: Fall Restrictions Weight Bearing Restrictions: No   Pertinent Vitals/Pain 4/10 chest    ADL  Eating/Feeding: Performed;Set up;Minimal assistance;Other (comment) (fingers with flexion contractures) Grooming: Performed;Wash/dry hands;Wash/dry face;Supervision/safety;Set up;Min guard Where Assessed - Grooming: Unsupported sitting Upper Body Bathing: Simulated;Moderate assistance Where Assessed - Upper Body Bathing: Unsupported sitting Lower Body Bathing: Maximal assistance;Simulated Upper Body Dressing: Performed;Moderate assistance Lower Body Dressing: +1 Total assistance Toilet Transfer: Performed;Moderate assistance Toilet Transfer Method: Sit to Barista: Bedside commode Toileting - Clothing Manipulation and Hygiene: +1 Total assistance Where Assessed - Toileting Clothing Manipulation and Hygiene: Standing Transfers/Ambulation Related to ADLs: cues for correct hand  placement    OT Diagnosis: Generalized weakness;Acute pain  OT Problem List: Decreased strength;Decreased activity tolerance;Impaired balance (sitting and/or standing);Pain OT Treatment Interventions: Self-care/ADL training;Therapeutic exercise;Patient/family education;Neuromuscular education;Balance training;Therapeutic activities;DME and/or AE instruction   OT Goals(Current goals can be found in the care plan section) Acute Rehab OT Goals Patient Stated Goal: to go home OT Goal Formulation: With patient Time For Goal Achievement: 01/06/14 Potential to Achieve Goals: Good ADL Goals Pt Will Perform Grooming: with set-up;with supervision;sitting Pt Will Perform Upper Body Bathing: with min assist;with min guard assist;sitting Pt Will Perform Lower Body Bathing: with mod assist;sitting/lateral leans;sit to/from stand Pt Will Perform Upper Body Dressing: with min assist;with min guard assist;sitting Pt Will Perform Lower Body Dressing: with mod assist;sitting/lateral leans;sit to/from stand;with max assist Pt Will Transfer to Toilet: with min assist;bedside commode Pt Will Perform Toileting - Clothing Manipulation and hygiene: with mod assist;sitting/lateral leans;sit to/from stand Pt Will Perform Tub/Shower Transfer: with min assist;shower seat  Visit Information  Last OT Received On: 12/30/13 Assistance Needed: +1 History of Present Illness: Pt adm with resp failure due to PNA. PMH is significant for severe COPD, chronic respiratory failure on home oxygen and BiPAP, hypertension, dementia, and diet-controlled DM-2.       Prior Functioning     Home Living Family/patient expects to be discharged to:: Private residence Living Arrangements: Children;Other relatives Available Help at Discharge: Family Type of Home: House Home Access: Stairs to enter Entrance Stairs-Number of Steps: 2-3 Home Layout: Two level;Able to live on main level with bedroom/bathroom Home Equipment: Dan Humphreys - 2  wheels;Cane - single point;Wheelchair - manual;Bedside commode Prior Function Level of Independence: Needs assistance Gait / Transfers Assistance Needed: Per pt she amb without assistance. ADL's / Homemaking Assistance Needed: pt states that she requires min - mod A with bathing and dressing from her niece, min A with self feeding Communication  Communication: No difficulties Dominant Hand: Right         Vision/Perception Vision - History Baseline Vision: Wears glasses only for reading Patient Visual Report: No change from baseline Perception Perception: Within Functional Limits   Cognition  Cognition Arousal/Alertness: Awake/alert Behavior During Therapy: WFL for tasks assessed/performed Overall Cognitive Status: No family/caregiver present to determine baseline cognitive functioning    Extremity/Trunk Assessment Upper Extremity Assessment Upper Extremity Assessment: RUE deficits/detail;LUE deficits/detail RUE Deficits / Details: fingers with flexion contractures LUE Deficits / Details: fingers with flexion contractures Lower Extremity Assessment Lower Extremity Assessment: Defer to PT evaluation     Mobility Bed Mobility Overal bed mobility: Needs Assistance Bed Mobility: Supine to Sit Supine to sit: Mod assist Transfers Overall transfer level: Needs assistance Equipment used: Rolling walker (2 wheeled) Transfers: Sit to/from Stand Sit to Stand: Mod assist General transfer comment: cues for correct hand placement          Balance Balance Overall balance assessment: Needs assistance Sitting-balance support: No upper extremity supported;Feet supported Sitting balance-Leahy Scale: Good Standing balance support: Bilateral upper extremity supported;During functional activity;Single extremity supported Standing balance-Leahy Scale: Poor   End of Session OT - End of Session Equipment Utilized During Treatment: Gait belt;Other (comment) (BSC) Activity Tolerance:  Patient tolerated treatment well Patient left: in chair;with call bell/phone within reach;with chair alarm set  GO     Galen ManilaSpencer, Crystall Donaldson Jeanette 12/30/2013, 3:25 PM

## 2013-12-30 NOTE — Discharge Summary (Signed)
Family Medicine Teaching [facility] Discharge Summary  Patient name: Katelyn BruinDelsenia Lerch Medical record number: [id number] Date of birth: 07/18/1941 Age: 73 y.o. Gender: female Date of Admission: 12/26/2013  Date of Discharge: 12/31/13 Admitting Physician: Carney LivingMarshall L Chambliss, MD  Primary Care Provider: No PCP Per Patient Consultants: none  Indication for Hospitalization: Acute respiratory failure  Discharge Diagnoses/Problem List:  Sepsis secondary to RLL HCAP - Improved RLL HCAP, in setting of recent hospitalization - Improved Acute on chronic respiratory failure, h/o severe COPD Chest pain, suspect secondary to acute respiratory failure - Improved Dementia with h/o depression Severe malnutrition in context of chronic illness Dysphagia, primary oral, complicated by respiratory fatigue Sacral decubitus ulcer Type 2 DM, diet controlled H/o HTN  Disposition: Home  Discharge Condition: Stable  Discharge Exam: General: Chronically ill and cachetic, NAD, 2L [place]  HEENT: NCAT, mil dry MM  Chest: mild reproducible TTP generalized  Cardiovascular: RRR  Respiratory: Persistent bibasilar crackles R>L (mild improvement), stable air movement overall. Normal WOB. Conversational with short phrases.  Abdomen: Soft, NTTP, ND, +active BS  Extremities: No LE edema, very thin ext, WWP.  Skin: Sacral decubitus ulcer with wound VAC and dressing in place.  Neuro: AAO x 3, no focal deficits   Brief Hospital Course: Katelyn Rojas is a 73 y.o. female presenting with Sepsis secondary to HCAP and acute respiratory failure. PMH is significant for severe COPD, chronic respiratory failure on home oxygen and BiPAP, hypertension, dementia, and diet-controlled DM-2.  # Sepsis secondary to RLL HCAP - Improved # RLL HCAP, in setting of recent hospitalization - Improved Patient presented after recent multiple day hospitalization for PNA + COPD exac (DC'd 12/20/13) and subsequent decline at home. On  admission to SDU, septic with tachycardia and hypoxia, identified source on CXR (new RLL infiltrate). Initial work-up with leukocytosis (WBC 16.8-->improved to 8.1), procalcitonin (0.10, nml), LA (0.45), blood cultures x2 (collected 2/13 with no growth), continued Vanc / Aztreonam IV started in ED (chose Aztreonam d/t PCN allergy). Continued to improve and transferred out to regular floor on 12/28/13. Remained afebrile, baseline mental status, stable resp status, improving WBC, narrowed from IV abx to Levaquin PO (750mg  q 48 hr, renally dosed), recommendation to avoid iron / vitamin supplements within 2 hours of taking Levaquin, suspect that recent incomplete course was unsuccessful d/t taking iron supplement. Prior to discharge patient remained afebrile showing good response.  # Acute on chronic respiratory failure, h/o severe COPD - Improved H/o chronic lung disease with severe COPD, likely multifactorial and worsened secondary to HCAP. On admission continued hypoxia required to be placed on BiPAP, initial ABG pH 7.32 / pCO2 77 / pO2 46 / HCO3 40.3 consistent with acute on chronic CO2 retention and hypoxemia with compensation of resp acidosis with elevated bicarb. Continued with Duonebs, Solumedrol IV, and BiPAP / O2 therapy with improvement in 24 hours, mental status improved with some worsened confusion per family, suspected d/t hypercapnia. Repeat ABG within 24 hours showed improvement with pCO2 61.3 / pO2 71.9. Continued improvement and resumed home day-time 2-3L [place] O2 with BiPAP only overnight, mental status returned to baseline (with dementia) and transitioned to Prednisone PO for taper on discharge.  # Chest pain, suspect secondary to acute respiratory failure - Improved Presented with atypical chest pain related to cough and reproducible, per report of primary caregiver (niece) patient has frequent chest and abdominal pains. Initial work-up with Troponin negative and EKG negative for acute ST-T wave  changes. Remained on telemetry and continued to monitor without  further event.  # Acute encephalopathy, in setting of hypercarbic respiratory failure # Dementia with h/o depression Chronic h/o dementia. Continued home Aricept, IIoperidone, Perphenazine, and Seroquel. Initially some increased confusion consistent with acute encephalopathy in setting of increased CO2 levels d/t respiratory failure. Demonstrated resolution back to baseline with improved respiratory status. Monitored EKG for any potential QTc prolongation concerns given significant inc risk from anti-psychotic medications and fluoroquinolone antibiotic, on re-check EKG QTc remained stable.  # Severe malnutrition in context of chronic illness # Dysphagia, primary oral, complicated by respiratory fatigue Significant nutritional concerns for inc nutritional demand in setting of chronic illness, poor wound healing (sacral decubitus ulcer), and high risk for re-feeding syndrome. Consulted Nutrition, with recommendations to continue Megace, supplement between meals with Glucerna Shakes PO BID, 30mL Prostat Liquid Protein PO BID, and MVI daily (if solid diet). Also, consider 2 week therapy Zinc sulfate 220mg  daily + Vitamin C 500mg  BID for improved wound healing. SLP followed given concern of declined mental status and h/o dysphagia, performed MBS with recommendation of Dysphagia 2 (fine chopped solids) and nectar thickened liquids. May advance diet in future if respiratory fatigue resolves.  # Sacral decubitus ulcer  On admission, patient already had Wound VAC in place, consulted WOC for continued management. Will need continued wound care on outpatient setting, arranged [facility] RN for wound care.  # DM-2, diet controlled  During hospitalization CBGs (70-140) have remained stable without SSI therapy.  # H/o HTN  Presented with low normal BP, has remained stable SBP 100-120s, continued home meds without concern.  Issues for Follow Up: 1.  Complete antibiotic course RLL HCAP - Levaquin PO 750mg  q 48 hr (total 14 day abx course), last dose 01/08/14. Advised to hold iron supplement during this time and may resume after completion, also avoid vitamins within 2 hours of taking FQ. 2. Chronic Respiratory Failure - f/u with Pulmonologist regarding O2 / BiPAP regimen 3. Prednisone Taper - discharged on Prednisone 40mg  PO rx to taper by 10mg  q 3 days, resume home dosage on 01/13/14 4. Dysphagia - SLP recommend fine chopped solids + nectar thick liquids, concern aspiration with primary dysphagia and respiratory fatigue, if resp status improves can increase diet. 5. Sacral Decubitus ulcer - continued home wound care with Wound Care. 6. HH PT, Daycare  Significant Procedures: none  Significant Labs and Imaging:   Recent Labs Lab 12/29/13 0655 12/30/13 0511 12/31/13 0338  WBC 11.7* 8.1 8.8  HGB 10.4* 10.6* 10.3*  HCT 33.3* 34.0* 33.2*  PLT 265 285 261    Recent Labs Lab 12/26/13 1130 12/27/13 0314  NA 146 143  K 4.2 4.6  CL 100 99  CO2 36* 35*  GLUCOSE 87 179*  BUN 25* 21  CREATININE 0.61 0.55  CALCIUM 9.2 8.8   2/14 ABG    Component  Value  Date/Time    PHART  7.400  12/27/2013 1740    PCO2ART  61.3*  12/27/2013 1740    PO2ART  71.9*  12/27/2013 1740    HCO3  37.2*  12/27/2013 1740    TCO2  39.1  12/27/2013 1740    O2SAT  95.3  12/27/2013 1740    Troponin-I - negative  Pro-BNP - 130.4  Procalcitonin - < 0.10  UA - (neg nit, neg leuk, not suggestive of infection)  BCx x 2 (collected 2/13 >> NGTD)  Imaging/Diagnostic Tests:  2/13 CXR 2v  IMPRESSION:  1. New infiltrate at the right lung base. This is superimposed on  severe  chronic lung disease.  2. Multiple compression fractures in the mid thoracic spine,  increased since 12/14/2013.  2/13 EKG  Sinus tachycardia, HR 103, without evidence of ST-T wave changes  2/17 MBS Pt demonstrates a primary oral dysphagia complicated by respiratory fatigue with PO intake  and weak cough response. The pt has a very slow oral mechanism with weak lingual pumping to slowly transit bolus in piecemeal fashion. This leads to premature spillage to pyriforms with thin liquids. Delay in swallow progressively increases with time and respiratory fatigue, and shortness of breath quickly begins after 2 swallows. The pt has intermittent sensation of aspiration and even with cough attempt does not have the force to expel penetrate or aspirate. Pts pharyngeal strength is actually quite good. For now recommend dys 2 (fine chop) solids and nectar thick liquids gien via cup or straw. Perhaps with improvement in respiratory function or endurance pt would be capable to upgrade. Needs stronger cough.  Results/Tests Pending at Time of Discharge: 1. Blood culture x 2 (collected 2/13 - NGTD - pending)  Discharge Medications:    Medication List    STOP taking these medications       ferrous sulfate 325 (65 FE) MG tablet      TAKE these medications       albuterol (2.5 MG/3ML) 0.083% nebulizer solution  Commonly known as:  PROVENTIL  Take 2.5 mg by nebulization every 4 (four) hours as needed for wheezing or shortness of breath.     albuterol 108 (90 BASE) MCG/ACT inhaler  Commonly known as:  PROVENTIL HFA;VENTOLIN HFA  Inhale 1-2 puffs into the lungs every 6 (six) hours as needed for wheezing or shortness of breath.     citalopram 10 MG/5ML suspension  Commonly known as:  CELEXA  Take 15 mg by mouth daily.     donepezil 10 MG tablet  Commonly known as:  ARICEPT  Take 10 mg by mouth at bedtime.     famotidine 20 MG tablet  Commonly known as:  PEPCID  Take 40 mg by mouth 2 (two) times daily.     FANAPT 1 MG Tabs  Generic drug:  Iloperidone  Take 3 mg by mouth at bedtime.     feeding supplement (GLUCERNA SHAKE) Liqd  Take 237 mLs by mouth 2 (two) times daily between meals.     feeding supplement (PRO-STAT SUGAR FREE 64) Liqd  Take 30 mLs by mouth 2 (two) times daily with  a meal.     Fluticasone-Salmeterol 250-50 MCG/DOSE Aepb  Commonly known as:  ADVAIR  Inhale 1 puff into the lungs 2 (two) times daily.     furosemide 20 MG tablet  Commonly known as:  LASIX  Take 20 mg by mouth daily.     GuaiFENesin DM 400-20 MG Tabs  Take 1 tablet by mouth every 4 (four) hours as needed (congestion).     levofloxacin 750 MG tablet  Commonly known as:  LEVAQUIN  Take 1 tablet (750 mg total) by mouth every other day. Start 12/31/13, last dose on 01/08/14.     megestrol 40 MG/ML suspension  Commonly known as:  MEGACE  Take 400 mg by mouth daily.     naproxen sodium 220 MG tablet  Commonly known as:  ANAPROX  Take 220 mg by mouth 2 (two) times daily as needed (arthritis).     perphenazine 2 MG tablet  Commonly known as:  TRILAFON  Take 6 mg by mouth at bedtime.     predniSONE  10 MG tablet  Commonly known as:  DELTASONE  Start 01/01/14 - take 4 tabs (40mg ) for 3 days, 01/04/14 take 3 tabs (30mg ) for 3 days, 01/07/14 take 2 tabs (20mg ) for 3 days, 01/10/14 take 1 tab (10mg ) for 3 days, 01/13/14 resume home dose daily     QUEtiapine 25 MG tablet  Commonly known as:  SEROQUEL  Take 25 mg by mouth at bedtime.     RESOURCE THICKENUP CLEAR Powd  Use appropriate amount until liquid thickened.     traMADol 50 MG tablet  Commonly known as:  ULTRAM  Take 50 mg by mouth every 6 (six) hours as needed for moderate pain.     Vitamin D (Ergocalciferol) 50000 UNITS Caps capsule  Commonly known as:  DRISDOL  Take 50,000 Units by mouth every 7 (seven) days. Monday        Discharge Instructions: Please refer to Patient Instructions section of EMR for full details.  Patient was counseled important signs and symptoms that should prompt return to medical care, changes in medications, dietary instructions, activity restrictions, and follow up appointments.   Follow-Up Appointments: Follow-up Information   Follow up with Pulmonology On 01/05/2014. (Previously scheduled Pulmonology  appointment)       Saralyn Pilar, DO 12/31/2013, 11:34 AM PGY-1, [facility]

## 2013-12-30 NOTE — Progress Notes (Signed)
Pt stated that she is no interested in wearing BiPAP tonight. Normally the patients family places mask on her but they are not here at this time. RT will monitor.

## 2013-12-30 NOTE — Progress Notes (Signed)
12/30/13 Patient admitted with VAC to sacral wound. Contacted Rickie Toye with [employer], they had provided patient's home VAC. Upon discharge the home [facility] will need to be reapplied in place of the hospital VAC. [name] home VAC is in her room. Faxed WOC note to Rickie as requested. Spoke with patient, she gave permission to speak with her niece. Contacted  Jannet Mantis [phone], patient currently receiving [facility] services from [facility].Ms Chestine Spore stated that she wants to continue with [facility] for continuity of care. Contacted [facility], the office was closed due to weather. CM will follow up on 12/31/13 with [facility] to resume HHRN, PT and speech. Patient attends day care at [facility] M-F while niece is at work.  Jacquelynn Cree RN, BSN, CCM

## 2013-12-31 ENCOUNTER — Telehealth: Payer: Self-pay | Admitting: Family Medicine

## 2013-12-31 DIAGNOSIS — R636 Underweight: Secondary | ICD-10-CM

## 2013-12-31 DIAGNOSIS — E43 Unspecified severe protein-calorie malnutrition: Secondary | ICD-10-CM | POA: Diagnosis present

## 2013-12-31 LAB — GLUCOSE, CAPILLARY
GLUCOSE-CAPILLARY: 146 mg/dL — AB (ref 70–99)
GLUCOSE-CAPILLARY: 122 mg/dL — AB (ref 70–99)
Glucose-Capillary: 101 mg/dL — ABNORMAL HIGH (ref 70–99)

## 2013-12-31 LAB — CBC WITH DIFFERENTIAL/PLATELET
BASOS ABS: 0 10*3/uL (ref 0.0–0.1)
Basophils Relative: 0 % (ref 0–1)
Eosinophils Absolute: 0 10*3/uL (ref 0.0–0.7)
Eosinophils Relative: 0 % (ref 0–5)
HEMATOCRIT: 33.2 % — AB (ref 36.0–46.0)
HEMOGLOBIN: 10.3 g/dL — AB (ref 12.0–15.0)
LYMPHS PCT: 11 % — AB (ref 12–46)
Lymphs Abs: 0.9 10*3/uL (ref 0.7–4.0)
MCH: 30.7 pg (ref 26.0–34.0)
MCHC: 31 g/dL (ref 30.0–36.0)
MCV: 99.1 fL (ref 78.0–100.0)
MONOS PCT: 12 % (ref 3–12)
Monocytes Absolute: 1.1 10*3/uL — ABNORMAL HIGH (ref 0.1–1.0)
NEUTROS ABS: 6.7 10*3/uL (ref 1.7–7.7)
Neutrophils Relative %: 77 % (ref 43–77)
Platelets: 261 10*3/uL (ref 150–400)
RBC: 3.35 MIL/uL — AB (ref 3.87–5.11)
RDW: 13 % (ref 11.5–15.5)
WBC: 8.8 10*3/uL (ref 4.0–10.5)

## 2013-12-31 MED ORDER — LEVOFLOXACIN 750 MG PO TABS: 750.0000 mg | ORAL_TABLET | ORAL | Status: AC

## 2013-12-31 MED ORDER — PRO-STAT SUGAR FREE PO LIQD: 30.0000 mL | Freq: Two times a day (BID) | ORAL | Status: DC

## 2013-12-31 MED ORDER — GLUCERNA SHAKE PO LIQD: 237.0000 mL | Freq: Two times a day (BID) | ORAL | Status: DC

## 2013-12-31 MED ORDER — PREDNISONE 10 MG PO TABS: ORAL_TABLET | ORAL | Status: DC

## 2013-12-31 MED ORDER — RESOURCE THICKENUP CLEAR PO POWD: ORAL | Status: DC

## 2013-12-31 NOTE — Progress Notes (Signed)
Occupational Therapy Treatment Patient Details Name: Katelyn Rojas MRN: [id number] DOB: 1941/02/21 Today's Date: 12/31/2013 Time: 1157-2620 OT Time Calculation (min): 24 min  OT Assessment / Plan / Recommendation  History of present illness Pt adm with resp failure due to PNA. PMH is significant for severe COPD, chronic respiratory failure on home oxygen and BiPAP, hypertension, dementia, and diet-controlled DM-2.   OT comments  Pt making progress with function goals and should continue with acute OT services to increase level of function and safety. Pt states that she is feeling better today  Follow Up Recommendations  Supervision/Assistance - 24 hour    Barriers to Discharge   none    Equipment Recommendations  None recommended by OT    Recommendations for Other Services    Frequency Min 2X/week   Progress towards OT Goals Progress towards OT goals: Progressing toward goals  Plan Discharge plan remains appropriate    Precautions / Restrictions Precautions Precautions: Fall Restrictions Weight Bearing Restrictions: No   Pertinent Vitals/Pain 3/10 chest area, buttocks    ADL  Grooming: Performed;Wash/dry hands;Wash/dry face;Supervision/safety;Set up Where Assessed - Grooming: Unsupported sitting Upper Body Bathing: Simulated;Minimal assistance Lower Body Bathing: Maximal assistance;Simulated;Moderate assistance Upper Body Dressing: Performed;Minimal assistance Toilet Transfer: Performed;Minimal assistance;Min guard Toilet Transfer Method: Sit to stand Toilet Transfer Equipment: Bedside commode Toileting - Clothing Manipulation and Hygiene: Maximal assistance Where Assessed - Toileting Clothing Manipulation and Hygiene: Standing Transfers/Ambulation Related to ADLs: cues for correct hand placement ADL Comments: pt required multipe rest breaks to comlpete ADLs and ADLmobility tasks    OT Diagnosis:    OT Problem List:   OT Treatment Interventions:     OT  Goals(current goals can now be found in the care plan section) Acute Rehab OT Goals Patient Stated Goal: to go home  Visit Information  Last OT Received On: 12/31/13 Assistance Needed: +1 History of Present Illness: Pt adm with resp failure due to PNA. PMH is significant for severe COPD, chronic respiratory failure on home oxygen and BiPAP, hypertension, dementia, and diet-controlled DM-2.    Subjective Data      Prior Functioning       Cognition  Cognition Arousal/Alertness: Awake/alert Behavior During Therapy: WFL for tasks assessed/performed Overall Cognitive Status: Within Functional Limits for tasks assessed    Mobility  Bed Mobility General bed mobility comments: pt up in recliner Transfers Overall transfer level: Needs assistance Equipment used: Rolling walker (2 wheeled) Transfers: Sit to/from Stand Sit to Stand: Min assist;Mod assist General transfer comment: cues for correct hand placement          Balance Balance Overall balance assessment: Needs assistance Sitting-balance support: No upper extremity supported;Feet supported Sitting balance-Leahy Scale: Good  End of Session OT - End of Session Equipment Utilized During Treatment: Gait belt;Other (comment) (BSC) Activity Tolerance: Patient tolerated treatment well Patient left: in chair;with call bell/phone within reach;with chair alarm set  GO     [name] 12/31/2013, 1:56 PM

## 2013-12-31 NOTE — Progress Notes (Signed)
Nsg Discharge Note  Admit Date:  12/26/2013 Discharge date: 12/31/2013   Katelyn Bruinelsenia Rojas to be D/C'd Home per MD order.  AVS completed.  Copy for chart, and copy for patient signed, and dated. Patient/caregiver able to verbalize understanding.  Discharge Medication:   Medication List    STOP taking these medications       ferrous sulfate 325 (65 FE) MG tablet      TAKE these medications       albuterol (2.5 MG/3ML) 0.083% nebulizer solution  Commonly known as:  PROVENTIL  Take 2.5 mg by nebulization every 4 (four) hours as needed for wheezing or shortness of breath.     albuterol 108 (90 BASE) MCG/ACT inhaler  Commonly known as:  PROVENTIL HFA;VENTOLIN HFA  Inhale 1-2 puffs into the lungs every 6 (six) hours as needed for wheezing or shortness of breath.     citalopram 10 MG/5ML suspension  Commonly known as:  CELEXA  Take 15 mg by mouth daily.     donepezil 10 MG tablet  Commonly known as:  ARICEPT  Take 10 mg by mouth at bedtime.     famotidine 20 MG tablet  Commonly known as:  PEPCID  Take 40 mg by mouth 2 (two) times daily.     FANAPT 1 MG Tabs  Generic drug:  Iloperidone  Take 3 mg by mouth at bedtime.     feeding supplement (GLUCERNA SHAKE) Liqd  Take 237 mLs by mouth 2 (two) times daily between meals.     feeding supplement (PRO-STAT SUGAR FREE 64) Liqd  Take 30 mLs by mouth 2 (two) times daily with a meal.     Fluticasone-Salmeterol 250-50 MCG/DOSE Aepb  Commonly known as:  ADVAIR  Inhale 1 puff into the lungs 2 (two) times daily.     furosemide 20 MG tablet  Commonly known as:  LASIX  Take 20 mg by mouth daily.     GuaiFENesin DM 400-20 MG Tabs  Take 1 tablet by mouth every 4 (four) hours as needed (congestion).     levofloxacin 750 MG tablet  Commonly known as:  LEVAQUIN  Take 1 tablet (750 mg total) by mouth every other day. Start 12/31/13, last dose on 01/08/14.     megestrol 40 MG/ML suspension  Commonly known as:  MEGACE  Take 400 mg by  mouth daily.     naproxen sodium 220 MG tablet  Commonly known as:  ANAPROX  Take 220 mg by mouth 2 (two) times daily as needed (arthritis).     perphenazine 2 MG tablet  Commonly known as:  TRILAFON  Take 6 mg by mouth at bedtime.     predniSONE 10 MG tablet  Commonly known as:  DELTASONE  Start 01/01/14 - take 4 tabs (40mg ) for 3 days, 01/04/14 take 3 tabs (30mg ) for 3 days, 01/07/14 take 2 tabs (20mg ) for 3 days, 01/10/14 take 1 tab (10mg ) for 3 days, 01/13/14 resume home dose daily     QUEtiapine 25 MG tablet  Commonly known as:  SEROQUEL  Take 25 mg by mouth at bedtime.     RESOURCE THICKENUP CLEAR Powd  Use appropriate amount until liquid thickened.     traMADol 50 MG tablet  Commonly known as:  ULTRAM  Take 50 mg by mouth every 6 (six) hours as needed for moderate pain.     Vitamin D (Ergocalciferol) 50000 UNITS Caps capsule  Commonly known as:  DRISDOL  Take 50,000 Units by mouth every 7 (seven)  days. Monday        Discharge Assessment: Filed Vitals:   12/31/13 1511  BP: 84/45  Pulse: 92  Temp: 98.5 F (36.9 C)  Resp: 20   Skin clean, dry with sacral pressure ulcer with wound vac from PTA D/c Instructions-Education: Discharge instructions given to patient/family with verbalized understanding. D/c education completed with patient/family including follow up instructions, medication list, d/c activities limitations if indicated, with other d/c instructions as indicated by MD - patient able to verbalize understanding, all questions fully answered. Patient instructed to return to ED, call 911, or call MD for any changes in condition.  Patient escorted via WC, and D/C home via private auto.  Shaan Rhoads, Brooke Bonito, RN 12/31/2013 3:52 PM

## 2013-12-31 NOTE — Discharge Instructions (Signed)
You were hospitalized with acute respiratory failure because of a Right sided Pneumonia and required increased amounts of supplemental oxygen with BiPAP therapy. Because of your recent hospital stay we consider this a "Hospital Acquired Pneumonia", and it requires prolonged antibiotic therapy. Most of our other testing was normal, and it appears the infection was limited to the lungs.  Demonstrating appropriate response to antibiotics, we have prescribed a continued course of Levaquin on discharge. Important when taking this antibiotics to avoid all Iron and Vitamin supplements within 2 hours of taking it. Especially recommend holding the iron supplementation until the course is completed (last day of therapy on 01/08/14).  Continue to follow-up with your regular Pulmonologist. We did not make any changes regarding supplemental O2 status or BiPAP changes, as the previous regimen appears to be appropriate.   The Speech/Language specialists who performed the swallow study, have recommended a "Dysphagia 2 - Diet" with following recommendations: (fine chop) solids and nectar thick liquids gien via cup or straw. Perhaps with improvement in respiratory function or endurance pt would be capable to upgrade.  Nutrition has recommended to continue between meal protein/calorie supplementation with Glucerna shakes twice daily, and Pro-stat liquid protein supplement twice daily.  If your symptoms get significantly worse / return with worsening cough / fever/chills / chest pain / shortness of breath or respiratory failure, please seek urgent medical attention, contact your primary doctor or go to the emergency department.   Pneumonia, Adult Pneumonia is an infection of the lungs.  CAUSES Pneumonia may be caused by bacteria or a virus. Usually, these infections are caused by breathing infectious particles into the lungs (respiratory tract). SYMPTOMS   Cough.  Fever.  Chest pain.  Increased rate of  breathing.  Wheezing.  Mucus production. DIAGNOSIS  If you have the common symptoms of pneumonia, your caregiver will typically confirm the diagnosis with a chest X-ray. The X-ray will show an abnormality in the lung (pulmonary infiltrate) if you have pneumonia. Other tests of your blood, urine, or sputum may be done to find the specific cause of your pneumonia. Your caregiver may also do tests (blood gases or pulse oximetry) to see how well your lungs are working. TREATMENT  Some forms of pneumonia may be spread to other people when you cough or sneeze. You may be asked to wear a mask before and during your exam. Pneumonia that is caused by bacteria is treated with antibiotic medicine. Pneumonia that is caused by the influenza virus may be treated with an antiviral medicine. Most other viral infections must run their course. These infections will not respond to antibiotics.  PREVENTION A pneumococcal shot (vaccine) is available to prevent a common bacterial cause of pneumonia. This is usually suggested for:  People over 71 years old.  Patients on chemotherapy.  People with chronic lung problems, such as bronchitis or emphysema.  People with immune system problems. If you are over 65 or have a high risk condition, you may receive the pneumococcal vaccine if you have not received it before. In some countries, a routine influenza vaccine is also recommended. This vaccine can help prevent some cases of pneumonia.You may be offered the influenza vaccine as part of your care. If you smoke, it is time to quit. You may receive instructions on how to stop smoking. Your caregiver can provide medicines and counseling to help you quit. HOME CARE INSTRUCTIONS   Cough suppressants may be used if you are losing too much rest. However, coughing protects you by clearing  your lungs. You should avoid using cough suppressants if you can.  Your caregiver may have prescribed medicine if he or she thinks your  pneumonia is caused by a bacteria or influenza. Finish your medicine even if you start to feel better.  Your caregiver may also prescribe an expectorant. This loosens the mucus to be coughed up.  Only take over-the-counter or prescription medicines for pain, discomfort, or fever as directed by your caregiver.  Do not smoke. Smoking is a common cause of bronchitis and can contribute to pneumonia. If you are a smoker and continue to smoke, your cough may last several weeks after your pneumonia has cleared.  A cold steam vaporizer or humidifier in your room or home may help loosen mucus.  Coughing is often worse at night. Sleeping in a semi-upright position in a recliner or using a couple pillows under your head will help with this.  Get rest as you feel it is needed. Your body will usually let you know when you need to rest. SEEK IMMEDIATE MEDICAL CARE IF:   Your illness becomes worse. This is especially true if you are elderly or weakened from any other disease.  You cannot control your cough with suppressants and are losing sleep.  You begin coughing up blood.  You develop pain which is getting worse or is uncontrolled with medicines.  You have a fever.  Any of the symptoms which initially brought you in for treatment are getting worse rather than better.  You develop shortness of breath or chest pain. MAKE SURE YOU:   Understand these instructions.  Will watch your condition.  Will get help right away if you are not doing well or get worse. Document Released: 10/30/2005 Document Revised: 01/22/2012 Document Reviewed: 01/19/2011 [facility] Patient Information 2014 [place], [place].

## 2013-12-31 NOTE — Progress Notes (Signed)
Physical Therapy Treatment Patient Details Name: Katelyn Rojas MRN: [id number] DOB: [birth year] Today's Date: 12/31/2013 Time: 1017-5102 PT Time Calculation (min): 14 min  PT Assessment / Plan / Recommendation  History of Present Illness 73 y.o. female admitted to [facility] on 12/26/13 with Sepsis secondary to HCAP and acute respiratory failure. PMH is significant for severe COPD, chronic respiratory failure on home oxygen and BiPAP, hypertension, dementia, and diet-controlled DM-2.  She also has a sacral wound that has a wound vac (she had this at home as well).     PT Comments   Pt is showing signs of continued deconditioning.  She is mod assist to transfer and max assist for bed mobility today.  She presents more like being appropriate for SNF placement at discharge.  She will need 24/7 mod to max one person assist at home and I believe she is usually ambulatory with the RW with supervision no physical assist.  Family may still choose to take her home, but will need to know how much assist she needs now.    Follow Up Recommendations  SNF (family may choose to take her home with HHPT)     Does the patient have the potential to tolerate intense rehabilitation    NA  Barriers to Discharge   None      Equipment Recommendations  None recommended by PT    Recommendations for Other Services   None  Frequency Min 3X/week   Progress towards PT Goals Progress towards PT goals: Not progressing toward goals - comment (due to pain and deconditioning. )  Plan Discharge plan needs to be updated    Precautions / Restrictions Precautions Precautions: Fall Precaution Comments: generally weak, wound vac to sacral wound.  Restrictions Weight Bearing Restrictions: No   Pertinent Vitals/Pain O2  Via [place] used the entire session.  DOE 3/4.  Unable to get accurate O2 reading via finger pulse ox.      Mobility  Bed Mobility Overal bed mobility: Needs Assistance Bed Mobility: Sit to Supine Supine to sit:  Max assist General bed mobility comments: max assist to support trunk and lift bil legs into the bed.  Total assist to help pt scoot up in the bed.  Transfers Overall transfer level: Needs assistance Equipment used: Rolling walker (2 wheeled) Transfers: Sit to/from [employer] Sit to Stand: Mod assist Stand pivot transfers: Mod assist General transfer comment: mod assist to support trunk over legs and provide support to boost up during transition to standing .  Verbal cues for safe hand placement.  Used RW to pivot from low recliner to higher bed.  Assist needed to help control descent to sit and support trunk for balance when turning to the bed even with the support of the RW.       PT Goals (current goals can now be found in the care plan section) Acute Rehab PT Goals Patient Stated Goal: to go home  Visit Information  Last PT Received On: 12/31/13 Assistance Needed: +1 History of Present Illness: 73 y.o. female admitted to [facility] on 12/26/13 with Sepsis secondary to HCAP and acute respiratory failure. PMH is significant for severe COPD, chronic respiratory failure on home oxygen and BiPAP, hypertension, dementia, and diet-controlled DM-2.  She also has a sacral wound that has a wound vac (she had this at home as well).      Subjective Data  Subjective: Pt reports, " I just hurt all over" Patient Stated Goal: to go home   Cognition  Cognition Arousal/Alertness: Awake/alert Behavior During Therapy: WFL for tasks assessed/performed Overall Cognitive Status: History of cognitive impairments - at baseline    Balance  Balance Overall balance assessment: Needs assistance Sitting-balance support: Bilateral upper extremity supported;Feet supported Sitting balance-Leahy Scale: Fair Standing balance support: Bilateral upper extremity supported Standing balance-Leahy Scale: Poor Standing balance comment: Pt stood ~2 mins while RN tech cleaned her and changed her adult diaper.   PT assisting in supporting her trunk over weak legs in standing.  General Comments General comments (skin integrity, edema, etc.): Wound vac and O2 used.   End of Session PT - End of Session Equipment Utilized During Treatment: Gait belt;Oxygen Activity Tolerance: Patient limited by fatigue;Patient limited by pain Patient left: in bed;with call bell/phone within reach;Other (comment) (on right side for pressure relief. ) Nurse Communication: Patient requests pain meds    Caedyn Tassinari B. Spencer Cardinal, PT, DPT [phone]   12/31/2013, 4:57 PM

## 2013-12-31 NOTE — Progress Notes (Signed)
Family Medicine Teaching Service Daily Progress Note Intern Pager: [phone]  Patient name: Katelyn Rojas Medical record number: [id number] Date of birth: Apr 21, 1941 Age: 73 y.o. Gender: female  Primary Care Provider: No PCP Per Patient Consultants: None Code Status: Full  Pt Overview and Major Events to Date:  12/26/13 - admitted for acute resp failure, sepsis with RLL HCAP, on BiPAP, LA 0.45, Pro-calcitonin 0.10, WBC 16.8 12/27/13 - off BiPAP during day, SLP eval, afebrile, WBC 16.9 12/28/13 - transferred to floor 12/29/13 - WBC 12.7-->11.7, Procalcitonin 0.10, DC'd Vanc / Aztreonam --> start Levaquin PO 12/30/13 - WBC 11.7-->8.1  Assessment and Plan: Katelyn Rojas is a 73 y.o. female presenting with Sepsis secondary to HCAP and acute respiratory failure. PMH is significant for severe COPD, chronic respiratory failure on home oxygen and BiPAP, hypertension, dementia, and diet-controlled DM-2.  # Sepsis secondary to RLL HCAP - Improved Patient presented after recent multiple day hospitalization for PNA + COPD exac (DC'd 12/20/13) with decline at home. On admission to SDU, septic with tachycardia and hypoxia, identified source on CXR (new RLL infiltrate). Transferred out to regular floor on 12/28/13. - monitor VS, on telemetry - Continue narrowed antibiotics      - Continue Levaquin PO 750mg  q 48h (2/18>>) ( (CrCL <50 per pharm), expect 10 day course total abx (DC'd PO iron while receiving FQ antibiotics, also space 2 hrs apart from vitamins / antacids).      - DC'd Vancomycin IV (2/13>>2/16)      - DC'd Aztreonam IV (2/13>>2/16) - f/u daily CBC, WBC improving 16.8>>8.8 - Blood cultures x 2 (collected 2/13 - NGTD on 2/18) - procalcitonin stable at <0.10  # Acute on Chronic Respiratory failure, COPD - Improved H/o chronic lung disease with COPD, likely multifactorial and worsened secondary to HCAP - currently stable on 2-3L [place] during day, cont home BiPAP at night and appears at baseline  mentation - continue Prednisone 50mg  PO (2/13>>), h/o chronic Prednisone 10mg  daily (since 03/2013), gradual taper 1-2 weeks to return to baseline daily dose - continue DuoNebs TID scheduled, with PRN  # Chest Pain, suspect secondary to acute respiratory failure and co-morbidities - Resolved currently - Troponin negative and EKG negative   # Hx of HTN  - Stable currently, continues home meds  # Dementia and Hx of Depression  - Continuing home Aricept, IIoperidone, Perphenazine, and Seroquel - appears at baseline, no new changes in mental status at this time - repeat EKG today, (prior with normal QTc), inc risk d/t psychiatric meds + Levaquin  # Severe malnutrition in context of chronic illness, with cachexia and protein/calorie malnutrition Significant nutritional concerns for inc nutritional demand in setting of chronic illness, poor wound healing, and high risk for re-feeding syndrome.  - Continuing Megace - c/s Nutrition - greatly appreciate recommendations     - If able to advance diet, recommend to supplement between meals with Glucerna Shakes PO BID, 21mL Prostat Liquid Protein PO BID, and MVI daily (if solid diet).     - consider 2 week therapy Zinc sulfate 220mg  daily + Vitamin C 500mg  BID for improved wound healing  # Dysphagia, primary oral dysphagia complicated by respiratory fatigue - SLP following: (see detailed note)       - Recommend Dysphagia 2, nectar thickened liquids       - ordered MBS (12/30/13) - Rx nectar thickener  # Sacral decubitus ulcer  - Wound VAC in place - consulted WOC, f/u recs  DM-2, diet controlled  -  will monitor CBGs (100-120s)  FEN/GI: SLIV. Dysphagia Diet 2 with nectar thick liquids Prophylaxis: Heparin SQ  Disposition[facility]  Subjective: No acute events overnight. Patient reports feeling well.  Objective: Temp:  [97.5 F (36.4 C)-98.5 F (36.9 C)] 97.5 F (36.4 C) (02/18 0627) Pulse Rate:   [73-107] 73 (02/18 0627) Resp:  [20] 20 (02/18 0627) BP: (101-117)/(58-72) 109/60 mmHg (02/18 0634) SpO2:  [90 %-92 %] 92 % (02/18 0814) Physical Exam: General: Chronically ill and cachetic, NAD, 2L [place] HEENT: NCAT, mil dry MM Chest: mild reproducible TTP generalized Cardiovascular: RRR Respiratory: Persistent bibasilar crackles R>L (mild improvement), stable air movement overall. Normal WOB. Conversational with short phrases. Abdomen: Soft, NTTP, ND, +active BS Extremities: No LE edema, very thin ext, WWP.  Skin: Sacral decubitus ulcer with wound VAC and dressing in place.  Neuro: AAO x 3, no focal deficits   Laboratory:  Recent Labs Lab 12/29/13 0655 12/30/13 0511 12/31/13 0338  WBC 11.7* 8.1 8.8  HGB 10.4* 10.6* 10.3*  HCT 33.3* 34.0* 33.2*  PLT 265 285 261    Recent Labs Lab 12/26/13 1130 12/27/13 0314  NA 146 143  K 4.2 4.6  CL 100 99  CO2 36* 35*  BUN 25* 21  CREATININE 0.61 0.55  CALCIUM 9.2 8.8  GLUCOSE 87 179*   2/14 ABG    Component Value Date/Time   PHART 7.400 12/27/2013 1740   PCO2ART 61.3* 12/27/2013 1740   PO2ART 71.9* 12/27/2013 1740   HCO3 37.2* 12/27/2013 1740   TCO2 39.1 12/27/2013 1740   O2SAT 95.3 12/27/2013 1740   Troponin-I - negative Pro-BNP - 130.4 Procalcitonin - < 0.10  UA - (neg nit, neg leuk, not suggestive of infection) BCx x 2 (collected 2/13 >> NGTD)  Imaging/Diagnostic Tests:  2/13 CXR 2v IMPRESSION:  1. New infiltrate at the right lung base. This is superimposed on  severe chronic lung disease.  2. Multiple compression fractures in the mid thoracic spine,  increased since 12/14/2013.  2/13 EKG Sinus tachycardia, HR 103, without evidence of ST-T wave changes  2/17 MBS Pt demonstrates a primary oral dysphagia complicated by respiratory fatigue with PO intake and weak cough response. The pt has a very slow oral mechanism with weak lingual pumping to slowly transit bolus in piecemeal fashion. This leads to premature  spillage to pyriforms with thin liquids. Delay in swallow progressively increases with time and respiratory fatigue, and shortness of breath quickly begins after 2 swallows. The pt has intermittent sensation of aspiration and even with cough attempt does not have the force to expel penetrate or aspirate. Pts pharyngeal strength is actually quite good. For now recommend dys 2 (fine chop) solids and nectar thick liquids gien via cup or straw. Perhaps with improvement in respiratory function or endurance pt would be capable to upgrade. Needs stronger cough.   Beverely LowElena Hartley Urton, MD 12/31/2013, 9:32 AM PGY-1, [facility] FPTS Intern pager: [phone], text pages welcome

## 2013-12-31 NOTE — Progress Notes (Signed)
Attending Addendum  I examined the patient and discussed the assessment and plan with Dr. Richarda Blade. I have reviewed the note and agree.  Patient is alert. Sitting up in chair today. No distress. Able to tell me that her niece is [name]. Agree with d/c today with home health. Complete levaquin course, hold oral iron while on levaquin.     Dessa Phi, MD FAMILY MEDICINE TEACHING SERVICE

## 2013-12-31 NOTE — Progress Notes (Addendum)
NUTRITION FOLLOW-UP  Pt meets criteria for severe MALNUTRITION in the context of chronic illness as evidenced by severe fat and muscle mass loss.  DOCUMENTATION CODES Per approved criteria  -Severe malnutrition in the context of chronic illness -Underweight   INTERVENTION: Continue Glucerna Shakes PO BID and 30 ml Prostat liquid protein BID. Recommend continuation of oral nutrition supplements between meals when d/c to maximize nutrition and prevent further weight loss. RD to continue to follow nutrition care plan.  NUTRITION DIAGNOSIS: Increased nutrient needs related to wound healing, COPD and repletion as evidenced by estimated needs. Ongoing.  Goal: Intake to meet >90% of estimated nutrition needs. Unmet.  Monitor:  weight trends, lab trends, I/O's, diet advancement/PO intake  ASSESSMENT: PMHx significant for severe COPD, chronic respiratory failure, HTN, dementia, and diet controlled DM. Pt with COPD exacerbation at beginning of this month, went to OSH and was treated for PNA and COPD exacerbation, d/c home on 2/7. Admitted now with SOB and hypoxia.  Work-up reveals sepsis 2/2 RLL PNA.  BSE completed by SLP on 2/14 - recommend NPO status 2/2 pharyngeal weakness. Second BSE completed by SLP on 2/15 - recommendations for Dysphagia 1 Diet with Nectar Thickened Liquids. MBSS on 2/17 with recommendations for Dysphagia 2 Diet with Nectar Thickened Liquids. Eating 25-50%. Pt reports that she doesn't really like Glucerna Shakes but will drink them. Encouraged her to continue to drink them to maximize her intake.  Placed on Bipap on admission. Pt noted to have sacral decubitus ulcer with wound VAC in place.   Currently ordered for Megace.   Height: Ht Readings from Last 1 Encounters:  12/28/13 5\' 1"  (1.549 m)    Weight: Wt Readings from Last 1 Encounters:  12/27/13 86 lb 13.8 oz (39.4 kg)  Admit wt 94 lb  BMI:  Body mass index is 16.42 kg/(m^2). Underweight  Estimated  Nutritional Needs: Kcal: 1400 - 1600 Protein: 60 - 75 g Fluid: approx 1.5 liters daily  Skin: sacral wound; vac in place; WOC RN consult pending  Diet Order: Dysphagia 2; nectar thickened liquids  No intake or output data in the 24 hours ending 12/31/13 1026  Last BM: 2/17  Labs:   Recent Labs Lab 12/26/13 1130 12/27/13 0314  NA 146 143  K 4.2 4.6  CL 100 99  CO2 36* 35*  BUN 25* 21  CREATININE 0.61 0.55  CALCIUM 9.2 8.8  GLUCOSE 87 179*    CBG (last 3)   Recent Labs  12/30/13 2246 12/30/13 2323 12/31/13 0758  GLUCAP 70 116* 101*    Scheduled Meds: . ascorbic acid  500 mg Oral BID  . citalopram  15 mg Oral Daily  . donepezil  10 mg Oral QHS  . famotidine  40 mg Oral BID  . feeding supplement (GLUCERNA SHAKE)  237 mL Oral BID BM  . feeding supplement (PRO-STAT SUGAR FREE 64)  30 mL Oral BID WC  . heparin  5,000 Units Subcutaneous 3 times per day  . Iloperidone  3 mg Oral QHS  . ipratropium-albuterol  3 mL Nebulization TID  . levofloxacin  750 mg Oral Q48H  . megestrol  400 mg Oral Daily  . multivitamin  5 mL Oral Daily  . perphenazine  6 mg Oral QHS  . predniSONE  50 mg Oral Q breakfast  . QUEtiapine  25 mg Oral QHS  . sodium chloride  3 mL Intravenous Q12H  . traMADol  50 mg Oral Once  . zinc sulfate  220 mg  Oral Daily    Continuous Infusions:    Jarold Motto MS, RD, LDN Inpatient Registered Dietitian Pager: [phone] After-hours pager: [phone]

## 2013-12-31 NOTE — Telephone Encounter (Signed)
Katelyn Rojas needs discharge instructions to show: Diet is to be dysthegia one with nector thick fluid Note to go back to daycare BP was 84/45 Pulse of 92

## 2014-01-01 LAB — CULTURE, BLOOD (ROUTINE X 2)
CULTURE: NO GROWTH
Culture: NO GROWTH

## 2014-01-01 NOTE — Discharge Summary (Signed)
Attending Addendum  I examined the patient and discussed the discharge plan with Dr. Karamalegos. I have reviewed the note and agree.    Graceland Wachter, MD FAMILY MEDICINE TEACHING SERVICE   

## 2014-01-06 ENCOUNTER — Inpatient Hospital Stay: Payer: Medicare Other | Admitting: Family Medicine

## 2014-01-11 DIAGNOSIS — L89154 Pressure ulcer of sacral region, stage 4: Secondary | ICD-10-CM

## 2014-01-11 HISTORY — DX: Pressure ulcer of sacral region, stage 4: L89.154

## 2014-01-13 ENCOUNTER — Ambulatory Visit (INDEPENDENT_AMBULATORY_CARE_PROVIDER_SITE_OTHER): Payer: Medicare Other | Admitting: Family Medicine

## 2014-01-13 ENCOUNTER — Encounter: Payer: Self-pay | Admitting: Family Medicine

## 2014-01-13 VITALS — BP 105/65 | HR 87 | Temp 98.6°F | Ht 61.0 in | Wt 86.9 lb

## 2014-01-13 DIAGNOSIS — E43 Unspecified severe protein-calorie malnutrition: Secondary | ICD-10-CM

## 2014-01-13 DIAGNOSIS — L89109 Pressure ulcer of unspecified part of back, unspecified stage: Secondary | ICD-10-CM

## 2014-01-13 DIAGNOSIS — S22009A Unspecified fracture of unspecified thoracic vertebra, initial encounter for closed fracture: Secondary | ICD-10-CM

## 2014-01-13 DIAGNOSIS — S22000A Wedge compression fracture of unspecified thoracic vertebra, initial encounter for closed fracture: Secondary | ICD-10-CM | POA: Insufficient documentation

## 2014-01-13 DIAGNOSIS — L8993 Pressure ulcer of unspecified site, stage 3: Secondary | ICD-10-CM

## 2014-01-13 DIAGNOSIS — J449 Chronic obstructive pulmonary disease, unspecified: Secondary | ICD-10-CM

## 2014-01-13 DIAGNOSIS — L89153 Pressure ulcer of sacral region, stage 3: Secondary | ICD-10-CM

## 2014-01-13 DIAGNOSIS — E119 Type 2 diabetes mellitus without complications: Secondary | ICD-10-CM

## 2014-01-13 DIAGNOSIS — J9610 Chronic respiratory failure, unspecified whether with hypoxia or hypercapnia: Secondary | ICD-10-CM

## 2014-01-13 DIAGNOSIS — F0390 Unspecified dementia without behavioral disturbance: Secondary | ICD-10-CM

## 2014-01-13 DIAGNOSIS — R1310 Dysphagia, unspecified: Secondary | ICD-10-CM | POA: Insufficient documentation

## 2014-01-13 NOTE — Assessment & Plan Note (Signed)
Identified on CXR recent worsening mid-thoracic compression fractures  Plan: 1. Conservative / supportive therapy with back brace, analgesia 2. Defer to PCP - Recommend further work-up with DEXA scan, consider bisphosphonates

## 2014-01-13 NOTE — Patient Instructions (Signed)
Dear Jeannetta Ellis and [name], Thank you for coming in to clinic today. It was good to see you both in clinic today!  Today we discussed your Breathing status for a hospital follow-up appointment. 1. Overall, I'm glad to hear that you are doing better! 2. For the redness on nose, it does seem like it is caused by the BiPAP mask. I recommend trying alternative nose padding to see if anything will provide relief. Otherwise, you can alternate masks if her old masks did not put as much pressure on her nose (wear 1 mask for a few days then switch). If no luck, please speak with [facility] about other options. 3. I do not see any indication to continue any antibiotics or steroids at this time. 4. For the dysphagia, you may resume your regular diet but can continue the nectar thickened liquids for now. Overall, if there are no concerns of aspiration such as coughing/choking with eating and her breathing is stable, then she should be fine. 5. Compression fractures on Xray, you can monitor this in the future with a repeat Xray. Otherwise, at this time, I don't recommend any further treatment. Back brace support will be helpful 6. Keep up the good work at [employer]! 7. Continue to follow with Dr. Montez Morita and Gordy Councilman as usual. I will discuss with the rest of our team the possibility of [place] seeing her in the hospital if you come to [facility]. At this time, I cannot guarantee it, but will do my best. We can give you a call with an update on this. Call our clinic if you don't hear back.  Please schedule a follow-up appointment with Dr. Kirt Boys as needed.  If you have any other questions or concerns, please feel free to call the clinic to contact me. You may also schedule an earlier appointment if necessary.  However, if your symptoms get significantly worse, please go to the Emergency Department to seek immediate medical attention.  Saralyn Pilar, DO [facility]

## 2014-01-13 NOTE — Assessment & Plan Note (Signed)
Improved. Tolerating regular diet with thickened liquids without aspiration concerns  Plan: 1. Recommend continue nectar thickened liquids, resume regular diet 2. Consider future SLP eval if develop concerns for aspiration, high risk d/t weak pharyngeal muscles and poor respiratory status

## 2014-01-13 NOTE — Assessment & Plan Note (Signed)
In setting of severe COPD, dependent on BiPAP nightly, 2L O2 home - Reported baseline CO2 on ABG is 60  Plan: 1. Continue home O2, BiPAP 2. For pressure on nose from mask - recommend cushion for support, alternating masks for rotating pressure points, contact HH equipment supplier about re-fitting / new mask

## 2014-01-13 NOTE — Assessment & Plan Note (Signed)
Stable, managed by wound care with wound VAC

## 2014-01-13 NOTE — Progress Notes (Addendum)
Subjective:     Patient ID: Katelyn Rojas, female   DOB: 1941-09-28, 73 y.o.   MRN: [id number]  Patient presents for a Hospital Follow-up, with daughter and caregiver [name].  HPI  Acute on chronic respiratory failure in setting of RLL HCAP, COPD - Hospitalized 12/26/13 to 12/31/13 - Significantly improved today, reports feeling "much better", daughter believes that she is close to her baseline. Remains on continuous 2L O2 via [place]. Completed recent course of Levaquin and Prednisone. Reports previously taking Prednisone off and on since November d/t to acute exacerbations. - Concern about BiPAP mask compression on bridge of nose with skin irritation - Denies fever/chills, CP, dyspnea, cough - Followed by Pulmonology [facility])  Compression Frx: - Recent CXR revealed worsening T-spine compression fractures. Discussed results, pain appears to be improved wearing back brace for support, unclear of prior records/Xrays. - Denies h/o prior fractures  Dysphagia: - Tolerated Dysphagia 3 diet well in hospital given SLP eval and concern for potential aspiration risk, likely due to respiratory fatigue in setting of weak pharyngeal muscles - Doing well on regular diet, nectar thickened liquids - Denies any coughing / choking with eating  Additional PMH: T2DM, Chronic Dementia  Note: - Wishes to remain under primary care at [facility] (Dr. Kirt Boys), no plans to transfer to [facility]. However, expresses appreciation and interest for returning to [facility] for future hospitalizations. - requested Discharge summary / records sent to PCP at [facility] - Followed by Pulmonology  I have reviewed and updated the following as appropriate: allergies and current medications  Social Hx: Attends Daycare regularly, primary caregiver [name] (daughter)  Review of Systems  See above HPI     Objective:   Physical Exam  BP 105/65  Pulse 87  Temp(Src) 98.6 F (37 C) (Oral)  Ht  5\' 1"  (1.549 m)  Wt 86 lb 14.4 oz (39.418 kg)  BMI 16.43 kg/m2   Gen - thin elderly F, chronically ill but well-appearing, very pleasant, 2L [place] on, NAD HEENT - bridge of nose mildly red / tender due to BiPAP mask irritation, MMM Heart - RRR, no murmurs heard Lungs - Mostly CTAB, no wheezing, resolved focal crackles. Good air movement b/l. Normal WOB Abd - soft, NTND, no masses, +active BS Ext - non-tender, no edema, peripheral pulses intact +2 b/l Skin - warm, dry, no rashes Neuro - awake, alert, oriented (person, place, month), grossly non-focal, intact muscle strength 4/5 all ext, wheelchair bound (gait not tested)      Assessment:     See specific A&P problem list for details.      Plan:     See specific A&P problem list for details.

## 2014-01-13 NOTE — Assessment & Plan Note (Signed)
Stable, continues to attend Daycare  Plan: 1. No change to current psych medications

## 2014-01-13 NOTE — Assessment & Plan Note (Addendum)
Improved - Suspect recent hospitalized d/t acute on chronic respiratory failure in setting of COPD exacerbation / RLL HCAP - Stable, appears at baseline resp status, home 2L O2  Plan: 1. Completed course of abx / steroids. No indication to repeat at this time 2. Continue with PCP and Pulmonology  Note: Patient wishes to remain with PCP: Dr. Kirt Boys [facility]). Does not wish to transfer care to [facility] at this time. Expects to go to [facility] for future hospitalizations, but patient not considered established with [facility].

## 2014-01-13 NOTE — Assessment & Plan Note (Signed)
Weight stable since hospitalization  Plan: 1. Continue protein nutritional supplements

## 2014-01-31 ENCOUNTER — Inpatient Hospital Stay (HOSPITAL_COMMUNITY)
Admission: EM | Admit: 2014-01-31 | Discharge: 2014-02-05 | DRG: 193 | Disposition: A | Discharge status: Home Health | Payer: Medicare Other | Attending: Family Medicine | Admitting: Family Medicine

## 2014-01-31 ENCOUNTER — Encounter (HOSPITAL_COMMUNITY): Payer: Self-pay | Admitting: Emergency Medicine

## 2014-01-31 ENCOUNTER — Emergency Department (HOSPITAL_COMMUNITY): Payer: Medicare Other

## 2014-01-31 DIAGNOSIS — E43 Unspecified severe protein-calorie malnutrition: Secondary | ICD-10-CM

## 2014-01-31 DIAGNOSIS — F411 Generalized anxiety disorder: Secondary | ICD-10-CM | POA: Diagnosis present

## 2014-01-31 DIAGNOSIS — S22000A Wedge compression fracture of unspecified thoracic vertebra, initial encounter for closed fracture: Secondary | ICD-10-CM

## 2014-01-31 DIAGNOSIS — R531 Weakness: Secondary | ICD-10-CM

## 2014-01-31 DIAGNOSIS — R627 Adult failure to thrive: Secondary | ICD-10-CM | POA: Diagnosis present

## 2014-01-31 DIAGNOSIS — Z888 Allergy status to other drugs, medicaments and biological substances status: Secondary | ICD-10-CM

## 2014-01-31 DIAGNOSIS — R933 Abnormal findings on diagnostic imaging of other parts of digestive tract: Secondary | ICD-10-CM | POA: Diagnosis present

## 2014-01-31 DIAGNOSIS — L8990 Pressure ulcer of unspecified site, unspecified stage: Secondary | ICD-10-CM | POA: Diagnosis present

## 2014-01-31 DIAGNOSIS — K5900 Constipation, unspecified: Secondary | ICD-10-CM | POA: Diagnosis not present

## 2014-01-31 DIAGNOSIS — L89153 Pressure ulcer of sacral region, stage 3: Secondary | ICD-10-CM

## 2014-01-31 DIAGNOSIS — E874 Mixed disorder of acid-base balance: Secondary | ICD-10-CM | POA: Diagnosis present

## 2014-01-31 DIAGNOSIS — F3289 Other specified depressive episodes: Secondary | ICD-10-CM | POA: Diagnosis present

## 2014-01-31 DIAGNOSIS — D899 Disorder involving the immune mechanism, unspecified: Secondary | ICD-10-CM | POA: Diagnosis present

## 2014-01-31 DIAGNOSIS — F329 Major depressive disorder, single episode, unspecified: Secondary | ICD-10-CM | POA: Diagnosis present

## 2014-01-31 DIAGNOSIS — Z9981 Dependence on supplemental oxygen: Secondary | ICD-10-CM

## 2014-01-31 DIAGNOSIS — F0390 Unspecified dementia without behavioral disturbance: Secondary | ICD-10-CM

## 2014-01-31 DIAGNOSIS — R64 Cachexia: Secondary | ICD-10-CM | POA: Diagnosis present

## 2014-01-31 DIAGNOSIS — I10 Essential (primary) hypertension: Secondary | ICD-10-CM

## 2014-01-31 DIAGNOSIS — L8994 Pressure ulcer of unspecified site, stage 4: Secondary | ICD-10-CM | POA: Diagnosis present

## 2014-01-31 DIAGNOSIS — Z87891 Personal history of nicotine dependence: Secondary | ICD-10-CM

## 2014-01-31 DIAGNOSIS — M129 Arthropathy, unspecified: Secondary | ICD-10-CM | POA: Diagnosis present

## 2014-01-31 DIAGNOSIS — R4181 Age-related cognitive decline: Secondary | ICD-10-CM | POA: Diagnosis present

## 2014-01-31 DIAGNOSIS — D539 Nutritional anemia, unspecified: Secondary | ICD-10-CM | POA: Diagnosis present

## 2014-01-31 DIAGNOSIS — J441 Chronic obstructive pulmonary disease with (acute) exacerbation: Secondary | ICD-10-CM | POA: Diagnosis present

## 2014-01-31 DIAGNOSIS — L89109 Pressure ulcer of unspecified part of back, unspecified stage: Secondary | ICD-10-CM | POA: Diagnosis present

## 2014-01-31 DIAGNOSIS — K625 Hemorrhage of anus and rectum: Secondary | ICD-10-CM

## 2014-01-31 DIAGNOSIS — Z681 Body mass index (BMI) 19 or less, adult: Secondary | ICD-10-CM

## 2014-01-31 DIAGNOSIS — E119 Type 2 diabetes mellitus without complications: Secondary | ICD-10-CM

## 2014-01-31 DIAGNOSIS — R1311 Dysphagia, oral phase: Secondary | ICD-10-CM | POA: Diagnosis present

## 2014-01-31 DIAGNOSIS — J189 Pneumonia, unspecified organism: Principal | ICD-10-CM

## 2014-01-31 DIAGNOSIS — R1900 Intra-abdominal and pelvic swelling, mass and lump, unspecified site: Secondary | ICD-10-CM | POA: Diagnosis present

## 2014-01-31 DIAGNOSIS — J449 Chronic obstructive pulmonary disease, unspecified: Secondary | ICD-10-CM

## 2014-01-31 DIAGNOSIS — J9600 Acute respiratory failure, unspecified whether with hypoxia or hypercapnia: Secondary | ICD-10-CM

## 2014-01-31 DIAGNOSIS — IMO0002 Reserved for concepts with insufficient information to code with codable children: Secondary | ICD-10-CM

## 2014-01-31 DIAGNOSIS — Z515 Encounter for palliative care: Secondary | ICD-10-CM

## 2014-01-31 DIAGNOSIS — Z4789 Encounter for other orthopedic aftercare: Secondary | ICD-10-CM

## 2014-01-31 DIAGNOSIS — J9610 Chronic respiratory failure, unspecified whether with hypoxia or hypercapnia: Secondary | ICD-10-CM

## 2014-01-31 DIAGNOSIS — J90 Pleural effusion, not elsewhere classified: Secondary | ICD-10-CM | POA: Diagnosis present

## 2014-01-31 DIAGNOSIS — J9620 Acute and chronic respiratory failure, unspecified whether with hypoxia or hypercapnia: Secondary | ICD-10-CM | POA: Diagnosis present

## 2014-01-31 DIAGNOSIS — E46 Unspecified protein-calorie malnutrition: Secondary | ICD-10-CM | POA: Diagnosis present

## 2014-01-31 DIAGNOSIS — Z880 Allergy status to penicillin: Secondary | ICD-10-CM

## 2014-01-31 DIAGNOSIS — R634 Abnormal weight loss: Secondary | ICD-10-CM | POA: Diagnosis present

## 2014-01-31 HISTORY — DX: Pressure ulcer of sacral region, stage 4: L89.154

## 2014-01-31 HISTORY — DX: Collapsed vertebra, not elsewhere classified, lumbar region, initial encounter for fracture: M48.56XA

## 2014-01-31 HISTORY — DX: Unspecified dementia, unspecified severity, without behavioral disturbance, psychotic disturbance, mood disturbance, and anxiety: F03.90

## 2014-01-31 HISTORY — DX: Anemia, unspecified: D64.9

## 2014-01-31 LAB — BASIC METABOLIC PANEL
BUN: 18 mg/dL (ref 6–23)
CALCIUM: 9.3 mg/dL (ref 8.4–10.5)
CO2: 34 mEq/L — ABNORMAL HIGH (ref 19–32)
Chloride: 96 mEq/L (ref 96–112)
Creatinine, Ser: 0.61 mg/dL (ref 0.50–1.10)
GFR, EST NON AFRICAN AMERICAN: 89 mL/min — AB (ref >90)
GLUCOSE: 82 mg/dL (ref 70–99)
Potassium: 4.8 mEq/L (ref 3.7–5.3)
SODIUM: 140 meq/L (ref 137–147)

## 2014-01-31 LAB — CBC
HCT: 38.4 % (ref 36.0–46.0)
HEMOGLOBIN: 11.5 g/dL — AB (ref 12.0–15.0)
MCH: 30.5 pg (ref 26.0–34.0)
MCHC: 29.9 g/dL — AB (ref 30.0–36.0)
MCV: 101.9 fL — ABNORMAL HIGH (ref 78.0–100.0)
PLATELETS: 261 10*3/uL (ref 150–400)
RBC: 3.77 MIL/uL — ABNORMAL LOW (ref 3.87–5.11)
RDW: 14.4 % (ref 11.5–15.5)
WBC: 12.1 10*3/uL — ABNORMAL HIGH (ref 4.0–10.5)

## 2014-01-31 LAB — TROPONIN I: Troponin I: <0.3 ng/mL (ref <0.30)

## 2014-01-31 LAB — I-STAT TROPONIN, ED: TROPONIN I, POC: 0 ng/mL (ref 0.00–0.08)

## 2014-01-31 LAB — PRO B NATRIURETIC PEPTIDE: PRO B NATRI PEPTIDE: 155.9 pg/mL — AB (ref 0–125)

## 2014-01-31 MED ORDER — DONEPEZIL HCL 10 MG PO TABS
10.0000 mg | ORAL_TABLET | Freq: Every day | ORAL | Status: DC
  Administered 2014-01-31 – 2014-02-04 (×5): 10 mg via ORAL
  Filled 2014-01-31 (×6): qty 1

## 2014-01-31 MED ORDER — MEGESTROL ACETATE 40 MG/ML PO SUSP
400.0000 mg | Freq: Every day | ORAL | Status: DC
  Administered 2014-02-02 – 2014-02-04 (×3): 400 mg via ORAL
  Filled 2014-01-31 (×4): qty 10

## 2014-01-31 MED ORDER — IPRATROPIUM-ALBUTEROL 0.5-2.5 (3) MG/3ML IN SOLN
3.0000 mL | RESPIRATORY_TRACT | Status: DC
  Administered 2014-01-31 – 2014-02-01 (×4): 3 mL via RESPIRATORY_TRACT
  Filled 2014-01-31 (×4): qty 3

## 2014-01-31 MED ORDER — METHYLPREDNISOLONE SODIUM SUCC 125 MG IJ SOLR
125.0000 mg | Freq: Once | INTRAMUSCULAR | Status: AC
  Administered 2014-01-31: 125 mg via INTRAVENOUS
  Filled 2014-01-31: qty 2

## 2014-01-31 MED ORDER — IPRATROPIUM-ALBUTEROL 0.5-2.5 (3) MG/3ML IN SOLN
3.0000 mL | Freq: Once | RESPIRATORY_TRACT | Status: AC
  Administered 2014-01-31: 3 mL via RESPIRATORY_TRACT
  Filled 2014-01-31: qty 3

## 2014-01-31 MED ORDER — RESOURCE THICKENUP CLEAR PO POWD
ORAL | Status: DC | PRN
  Filled 2014-01-31: qty 125

## 2014-01-31 MED ORDER — ILOPERIDONE 1 MG PO TABS
3.0000 mg | ORAL_TABLET | Freq: Every day | ORAL | Status: DC
  Administered 2014-01-31 – 2014-02-04 (×5): 3 mg via ORAL
  Filled 2014-01-31 (×6): qty 3

## 2014-01-31 MED ORDER — HEPARIN SODIUM (PORCINE) 5000 UNIT/ML IJ SOLN
5000.0000 [IU] | Freq: Three times a day (TID) | INTRAMUSCULAR | Status: DC
  Administered 2014-01-31 – 2014-02-05 (×11): 5000 [IU] via SUBCUTANEOUS
  Filled 2014-01-31 (×17): qty 1

## 2014-01-31 MED ORDER — VITAMIN D (ERGOCALCIFEROL) 1.25 MG (50000 UNIT) PO CAPS
50000.0000 [IU] | ORAL_CAPSULE | ORAL | Status: DC
  Administered 2014-02-02: 50000 [IU] via ORAL
  Filled 2014-01-31: qty 1

## 2014-01-31 MED ORDER — PREDNISONE 10 MG PO TABS
10.0000 mg | ORAL_TABLET | Freq: Every day | ORAL | Status: DC
  Administered 2014-02-02: 10 mg via ORAL
  Filled 2014-01-31 (×3): qty 1

## 2014-01-31 MED ORDER — FOOD THICKENER (SIMPLYTHICK): 1.0000 | ORAL | Status: DC | PRN

## 2014-01-31 MED ORDER — SODIUM CHLORIDE 0.9 % IV SOLN: 250.0000 mL | INTRAVENOUS | Status: DC | PRN

## 2014-01-31 MED ORDER — CITALOPRAM HYDROBROMIDE 10 MG/5ML PO SOLN
15.0000 mg | Freq: Every day | ORAL | Status: DC
  Administered 2014-02-02 – 2014-02-05 (×4): 15 mg via ORAL
  Filled 2014-01-31 (×5): qty 10

## 2014-01-31 MED ORDER — FAMOTIDINE 40 MG PO TABS
40.0000 mg | ORAL_TABLET | Freq: Every day | ORAL | Status: DC
  Administered 2014-02-02 – 2014-02-05 (×4): 40 mg via ORAL
  Filled 2014-01-31 (×5): qty 1

## 2014-01-31 MED ORDER — PRO-STAT SUGAR FREE PO LIQD
30.0000 mL | Freq: Two times a day (BID) | ORAL | Status: DC
  Administered 2014-02-01 – 2014-02-05 (×8): 30 mL via ORAL
  Filled 2014-01-31 (×11): qty 30

## 2014-01-31 MED ORDER — PERPHENAZINE 2 MG PO TABS
6.0000 mg | ORAL_TABLET | Freq: Every day | ORAL | Status: DC
  Administered 2014-01-31 – 2014-02-04 (×5): 6 mg via ORAL
  Filled 2014-01-31 (×7): qty 1

## 2014-01-31 MED ORDER — TRAMADOL HCL 50 MG PO TABS
50.0000 mg | ORAL_TABLET | Freq: Four times a day (QID) | ORAL | Status: DC | PRN
  Administered 2014-02-01 – 2014-02-03 (×4): 50 mg via ORAL
  Filled 2014-01-31 (×5): qty 1

## 2014-01-31 MED ORDER — SODIUM CHLORIDE 0.9 % IJ SOLN: 3.0000 mL | INTRAMUSCULAR | Status: DC | PRN

## 2014-01-31 MED ORDER — MORPHINE SULFATE 2 MG/ML IJ SOLN: 1.0000 mg | INTRAMUSCULAR | Status: DC | PRN

## 2014-01-31 MED ORDER — SODIUM CHLORIDE 0.9 % IJ SOLN
3.0000 mL | Freq: Two times a day (BID) | INTRAMUSCULAR | Status: DC
  Administered 2014-02-01 – 2014-02-04 (×2): 3 mL via INTRAVENOUS

## 2014-01-31 MED ORDER — CALCITONIN (SALMON) 200 UNIT/ACT NA SOLN
1.0000 | Freq: Every day | NASAL | Status: DC
  Administered 2014-01-31 – 2014-02-04 (×4): 1 via NASAL
  Filled 2014-01-31: qty 3.7

## 2014-01-31 MED ORDER — ALBUTEROL SULFATE (2.5 MG/3ML) 0.083% IN NEBU: 2.5000 mg | INHALATION_SOLUTION | RESPIRATORY_TRACT | Status: DC | PRN

## 2014-01-31 MED ORDER — PREDNISONE 50 MG PO TABS
50.0000 mg | ORAL_TABLET | Freq: Every day | ORAL | Status: DC
  Administered 2014-02-02 – 2014-02-04 (×3): 50 mg via ORAL
  Filled 2014-01-31 (×5): qty 1

## 2014-01-31 MED ORDER — VANCOMYCIN HCL 1000 MG IV SOLR
750.0000 mg | INTRAVENOUS | Status: DC
  Administered 2014-01-31 – 2014-02-01 (×2): 750 mg via INTRAVENOUS
  Filled 2014-01-31 (×3): qty 750

## 2014-01-31 MED ORDER — DEXTROSE 5 % IV SOLN
2.0000 g | Freq: Once | INTRAVENOUS | Status: AC
  Administered 2014-01-31: 2 g via INTRAVENOUS
  Filled 2014-01-31: qty 2

## 2014-01-31 MED ORDER — FERROUS SULFATE 325 (65 FE) MG PO TABS
325.0000 mg | ORAL_TABLET | Freq: Three times a day (TID) | ORAL | Status: DC
  Administered 2014-02-01 – 2014-02-05 (×10): 325 mg via ORAL
  Filled 2014-01-31 (×16): qty 1

## 2014-01-31 MED ORDER — QUETIAPINE FUMARATE 50 MG PO TABS
50.0000 mg | ORAL_TABLET | Freq: Every day | ORAL | Status: DC
  Administered 2014-01-31 – 2014-02-04 (×5): 50 mg via ORAL
  Filled 2014-01-31 (×6): qty 1

## 2014-01-31 MED ORDER — ENSURE COMPLETE PO LIQD
237.0000 mL | Freq: Two times a day (BID) | ORAL | Status: DC
  Administered 2014-02-01 – 2014-02-05 (×8): 237 mL via ORAL
  Filled 2014-01-31 (×10): qty 237

## 2014-01-31 MED ORDER — FUROSEMIDE 20 MG PO TABS
20.0000 mg | ORAL_TABLET | Freq: Every day | ORAL | Status: DC
  Administered 2014-02-02 – 2014-02-05 (×4): 20 mg via ORAL
  Filled 2014-01-31 (×6): qty 1

## 2014-01-31 MED ORDER — MOMETASONE FURO-FORMOTEROL FUM 100-5 MCG/ACT IN AERO
2.0000 | INHALATION_SPRAY | Freq: Two times a day (BID) | RESPIRATORY_TRACT | Status: DC
  Administered 2014-01-31 – 2014-02-05 (×10): 2 via RESPIRATORY_TRACT
  Filled 2014-01-31: qty 8.8

## 2014-01-31 NOTE — ED Notes (Addendum)
Pt sent here by her doctor for possible bilateral pleural effusions per xray. Pt recently treated for PNA which has cleared up according to her xray that she brought with her. Pt on home o2 at 4L. Per family pt has had increased swelling in legs, having severe back pain and O2 levels area all over the place. Pt labored breathing in triage and tachypnea

## 2014-01-31 NOTE — ED Notes (Signed)
Meal tray ordered 

## 2014-01-31 NOTE — ED Provider Notes (Signed)
CSN: [id number]     Arrival date & time 01/31/14  1522 History   First MD Initiated Contact with Patient 01/31/14 1540     Chief Complaint  Patient presents with  . Shortness of Breath     (Consider location/radiation/quality/duration/timing/severity/associated sxs/prior Treatment) HPI Comments: Patient with history of COPD presents with shortness of breath. She was recently discharged from [facility] with health care associated pneumonia. This was in February. She's completed her course of antibiotics and was doing well. She lives at home with her daughter. Over the last 2 days her daughters noted some increased work of breathing and tachypnea. She also noticed some swelling of her lower extremities. She took her to her primary care physician yesterday with [facility]. They do a chest x-ray which was noted to have some bilateral pleural effusions per the daughter. Her breathing got worse through the night and she was given multiple breathing treatments. Her oxygen was increased from her baseline 2 L per minute to 3 L per minute. She was brought in here for further evaluation. She hasn't had any noted fevers. She's been complaining of some increased back pain and has a hx of thoracic compression fractures.  She was complaining of some chest pain earlier today.  Patient is a 73 y.o. female presenting with shortness of breath.  Shortness of Breath Associated symptoms: chest pain   Associated symptoms: no abdominal pain, no cough, no diaphoresis, no fever, no headaches, no rash and no vomiting     Past Medical History  Diagnosis Date  . Hypertension   . COPD (chronic obstructive pulmonary disease)   . Arthritis   . Oxygen dependent   . Anxiety   . Cachexia   . DM type 2 (diabetes mellitus, type 2)    Past Surgical History  Procedure Laterality Date  . Vaginal hysterectomy      ? Abdominal vs. Vaginal   History reviewed. No pertinent family history. History  Substance Use  Topics  . Smoking status: Former Games developer  . Smokeless tobacco: Not on file  . Alcohol Use: Not on file   OB History   Grav Para Term Preterm Abortions TAB SAB Ect Mult Living                 Review of Systems  Constitutional: Positive for fatigue. Negative for fever, chills and diaphoresis.  HENT: Negative for congestion, rhinorrhea and sneezing.   Eyes: Negative.   Respiratory: Positive for chest tightness and shortness of breath. Negative for cough.   Cardiovascular: Positive for chest pain. Negative for leg swelling.  Gastrointestinal: Negative for nausea, vomiting, abdominal pain, diarrhea and blood in stool.  Genitourinary: Negative for frequency, hematuria, flank pain and difficulty urinating.  Musculoskeletal: Positive for back pain. Negative for arthralgias.  Skin: Negative for rash.  Neurological: Negative for dizziness, speech difficulty, weakness, numbness and headaches.      Allergies  Namenda and Penicillins  Home Medications   Current Outpatient Rx  Name  Route  Sig  Dispense  Refill  . albuterol (PROVENTIL HFA;VENTOLIN HFA) 108 (90 BASE) MCG/ACT inhaler   Inhalation   Inhale 1-2 puffs into the lungs every 6 (six) hours as needed for wheezing or shortness of breath.         Marland Kitchen albuterol (PROVENTIL) (2.5 MG/3ML) 0.083% nebulizer solution   Nebulization   Take 2.5 mg by nebulization every 4 (four) hours as needed for wheezing or shortness of breath.         Marland Kitchen  Amino Acids-Protein Hydrolys (FEEDING SUPPLEMENT, PRO-STAT SUGAR FREE 64,) LIQD   Oral   Take 30 mLs by mouth 2 (two) times daily with a meal.   900 mL   0   . citalopram (CELEXA) 10 MG/5ML suspension   Oral   Take 15 mg by mouth daily.         Marland Kitchen Dextromethorphan-Guaifenesin (GUAIFENESIN DM) 400-20 MG TABS   Oral   Take 1 tablet by mouth every 4 (four) hours as needed (congestion).         Marland Kitchen donepezil (ARICEPT) 10 MG tablet   Oral   Take 10 mg by mouth at bedtime.         . ENSURE  PLUS (ENSURE PLUS) LIQD   Oral   Take 237 mLs by mouth 2 (two) times daily between meals.         . famotidine (PEPCID) 20 MG tablet   Oral   Take 40 mg by mouth daily.          . ferrous sulfate 325 (65 FE) MG tablet   Oral   Take 325 mg by mouth 3 (three) times daily with meals.         . Fluticasone-Salmeterol (ADVAIR) 250-50 MCG/DOSE AEPB   Inhalation   Inhale 1 puff into the lungs 2 (two) times daily.         . food thickener (SIMPLYTHICK) POWD   Oral   Take 1 packet by mouth as needed (nectar).         . furosemide (LASIX) 20 MG tablet   Oral   Take 20 mg by mouth daily.         . Iloperidone (FANAPT) 1 MG TABS   Oral   Take 3 mg by mouth at bedtime.         . megestrol (MEGACE) 40 MG/ML suspension   Oral   Take 400 mg by mouth daily.         . naproxen sodium (ANAPROX) 220 MG tablet   Oral   Take 220 mg by mouth 2 (two) times daily as needed (arthritis).         Marland Kitchen perphenazine (TRILAFON) 2 MG tablet   Oral   Take 6 mg by mouth at bedtime.         . predniSONE (DELTASONE) 10 MG tablet   Oral   Take 10 mg by mouth daily with breakfast.         . QUEtiapine (SEROQUEL) 25 MG tablet   Oral   Take 50 mg by mouth at bedtime.          . traMADol (ULTRAM) 50 MG tablet   Oral   Take 50 mg by mouth every 6 (six) hours as needed for moderate pain.         . Vitamin D, Ergocalciferol, (DRISDOL) 50000 UNITS CAPS capsule   Oral   Take 50,000 Units by mouth every 7 (seven) days. Monday          BP 110/46  Pulse 87  Temp(Src) 99.4 F (37.4 C) (Rectal)  Resp 25  SpO2 92% Physical Exam  Constitutional: She appears well-developed and well-nourished. She appears distressed.  frail  HENT:  Head: Normocephalic and atraumatic.  Eyes: Pupils are equal, round, and reactive to light.  Neck: Normal range of motion. Neck supple.  Cardiovascular: Normal rate, regular rhythm and normal heart sounds.   Pulmonary/Chest: Effort normal. No  respiratory distress. She has wheezes. She has no rales.  She exhibits no tenderness.  +increased WOB, tachypnea  Abdominal: Soft. Bowel sounds are normal. There is no tenderness. There is no rebound and no guarding.  Musculoskeletal: Normal range of motion. She exhibits no edema.  Lymphadenopathy:    She has no cervical adenopathy.  Neurological: She is alert.  Will follow commands.  Not verbal currently.  Moves all extremities  Skin: Skin is warm and dry. No rash noted.  Psychiatric: She has a normal mood and affect.    ED Course  Procedures (including critical care time) Labs Review Results for orders placed during the hospital encounter of 01/31/14  CBC      Result Value Ref Range   WBC 12.1 (*) 4.0 - 10.5 K/uL   RBC 3.77 (*) 3.87 - 5.11 MIL/uL   Hemoglobin 11.5 (*) 12.0 - 15.0 g/dL   HCT 49.8  26.4 - 15.8 %   MCV 101.9 (*) 78.0 - 100.0 fL   MCH 30.5  26.0 - 34.0 pg   MCHC 29.9 (*) 30.0 - 36.0 g/dL   RDW 30.9  40.7 - 68.0 %   Platelets 261  150 - 400 K/uL  BASIC METABOLIC PANEL      Result Value Ref Range   Sodium 140  137 - 147 mEq/L   Potassium 4.8  3.7 - 5.3 mEq/L   Chloride 96  96 - 112 mEq/L   CO2 34 (*) 19 - 32 mEq/L   Glucose, Bld 82  70 - 99 mg/dL   BUN 18  6 - 23 mg/dL   Creatinine, Ser 8.81  0.50 - 1.10 mg/dL   Calcium 9.3  8.4 - 10.3 mg/dL   GFR calc non Af Amer 89 (*) >90 mL/min   GFR calc Af Amer >90  >90 mL/min  PRO B NATRIURETIC PEPTIDE      Result Value Ref Range   Pro B Natriuretic peptide (BNP) 155.9 (*) 0 - 125 pg/mL  I-STAT TROPOININ, ED      Result Value Ref Range   Troponin i, poc 0.00  0.00 - 0.08 ng/mL   Comment 3            Dg Chest Portable 1 View  01/31/2014   CLINICAL DATA:  Shortness of breath.  COPD.  Hypertension.  EXAM: PORTABLE CHEST - 1 VIEW  COMPARISON:  01/30/14  FINDINGS: Changes of COPD again noted. New opacity seen in both lung bases. No definite pleural effusion seen. Heart size is stable.  IMPRESSION: New bibasilar pulmonary  opacity superimposed on COPD. Pneumonia cannot be excluded.   Electronically Signed   By: Myles Rosenthal M.D.   On: 01/31/2014 17:31     Imaging Review Dg Chest Portable 1 View  01/31/2014   CLINICAL DATA:  Shortness of breath.  COPD.  Hypertension.  EXAM: PORTABLE CHEST - 1 VIEW  COMPARISON:  01/30/14  FINDINGS: Changes of COPD again noted. New opacity seen in both lung bases. No definite pleural effusion seen. Heart size is stable.  IMPRESSION: New bibasilar pulmonary opacity superimposed on COPD. Pneumonia cannot be excluded.   Electronically Signed   By: Myles Rosenthal M.D.   On: 01/31/2014 17:31     EKG Interpretation   Date/Time:  Saturday January 31 2014 15:41:57 EDT Ventricular Rate:  92 PR Interval:  110 QRS Duration: 60 QT Interval:  334 QTC Calculation: 413 R Axis:   35 Text Interpretation:  Sinus rhythm with short PR Otherwise normal ECG  since last tracing no significant change Confirmed  by Hayk Divis  MD, Jaquae Rieves  [phone]([id number]) on 01/31/2014 3:43:52 PM      MDM   Final diagnoses:  HCAP (healthcare-associated pneumonia)    Shortness of breath. She was going to get with increased work of breathing. She was given nebulizer treatment as well as a dose of steroids. She had significant improvement after this. She is able to converse and her work of breathing is diminished. Her chest x-ray shows possible pneumonia. She was given antibiotic therapy for the healthcare associated pneumonia. I consulted the family medicine service who is on call for unassigned patients for admission.    Rolan BuccoMelanie Javari Bufkin, MD 01/31/14 1800

## 2014-01-31 NOTE — ED Notes (Signed)
MD at bedside. 

## 2014-01-31 NOTE — Consult Note (Signed)
PHARMACY CONSULT NOTE - INITIAL  Pharmacy Consult for :   Vancomycin Indication:  Recurrent HCAP  Hospital Problems: Active Problems:   * No active hospital problems. *   Allergies: Allergies  Allergen Reactions  . Namenda [Memantine Hcl] Other (See Comments)    "hallucinations"   . Penicillins Other (See Comments)    unknown    Patient Measurements: Height: 5' 1.02" (155 cm) Weight: 86 lb 13.8 oz (39.4 kg) IBW/kg (Calculated) : 47.86  Vital Signs: BP 110/46  Pulse 87  Temp(Src) 99.4 F (37.4 C) (Rectal)  Resp 25  Ht 5' 1.02" (1.55 m)  Wt 86 lb 13.8 oz (39.4 kg)  BMI 16.40 kg/m2  SpO2 92%  Labs:  Recent Labs  01/31/14 1540  WBC 12.1*  HGB 11.5*  PLT 261  CREATININE 0.61   Estimated Creatinine Clearance: 40.1 ml/min (by C-G formula based on Cr of 0.61).   Microbiology: None recent  Medical/Surgical History: Past Medical History  Diagnosis Date  . Hypertension   . COPD (chronic obstructive pulmonary disease)   . Arthritis   . Oxygen dependent   . Anxiety   . Cachexia   . DM type 2 (diabetes mellitus, type 2)    Past Surgical History  Procedure Laterality Date  . Vaginal hysterectomy      ? Abdominal vs. Vaginal    Current Medication[s] Include: PTA Medication: Pending  Scheduled:  Pending  Infusion[s]: Pending  Antibiotic[s]: Anti-infectives   Start     Dose/Rate Route Frequency Ordered Stop   01/31/14 1800  aztreonam (AZACTAM) 2 g in dextrose 5 % 50 mL IVPB     2 g 100 mL/hr over 30 Minutes Intravenous  Once 01/31/14 1752        Assessment:  73 y/o female recently discharged from [facility] where she was treated for HCAP.  Over the last few days, she has developed worsening SOB, increased O2 demand with her home oxygen, Bilateral LE swelling.  She saw her PCP yesterday where a CXR noted bilateral pleural effusions.  Multiple breathing treatments were not able to improve her respiratory status.  Patient is admitted and will be  treated for presumed HCAP.  She has received a single dose of Aztreonam.  Vancomycin per pharmacy consult, will be started.  Pt Wt 39.4 kg,  Est CrCl 40 ml/min.  Tc 99.4 F, WBC 12.1.  Goal of Therapy:   Vancomycin trough level 15-20 mcg/ml   Plan:  1. Begin Vancomycin 750 mg IV q 24 hours. 2. Monitor renal function, WBC, fever curve, any cultures/sensitivities, and clinical progression. 3. Follow selection of broadening initial HCAP coverage.  Alease Fait, Elisha Headland,  Pharm.D.,    3/21/20156:28 PM

## 2014-01-31 NOTE — H&P (Signed)
Family Medicine Teaching [facility] Admission History and Physical Service Pager: [phone]  Patient name: Katelyn Rojas Medical record number: [id number] Date of birth: 07/08/1941 Age: 73 y.o. Gender: female  Primary Care Provider: Kirt BoysARTER,[name], MD Consultants: None Code Status: Full  Chief Complaint: Desaturations, back pain  Assessment and Plan: Katelyn Rojas is a 73 y.o. female presenting with HCAP . PMH is significant for Chronic Resp failure 2/2 COPD, malniutrition, Sacral decubitus ulcer, T2DM, and HTN.   HCAP- in setting of COPD, acute on chronic respiratory failure - With recent hospitalization in February - Vanc and aztreonam per pharmacy (penicillin allergic) - Blood culture, sputum culture and Gram stain, HIV, Legionella and strep urine antigen - Chronic steroid use, increased to prednisone 50 mg daily (received 125 mg Solu-Medrol in ED) - Duo neb every 4 hours, albuterol neb every 2 when necessary  Chest pain - Atypical chest pain - EKG without ischemic changes - trend troponins  Metabolic alkalosis - Likely compensation from chronic respiratory acidosis caused by chronic CO2 retention - Monitor BMP  Abdominal pain - Tenderness to palation anywhere,. No other GI complaints - Visible hernia on exam, tenderness to palpation, however is not red or firm feeling, I doubt incarceration - Vital signs stable, we'll monitor closely and consider imaging tomorrow  Back pain - likely compression fracture related - tylenol, calcitonin  Sacral decubitus ulcer - Likely healing is inhibited by chronic steroid use, continue wound VAC - Consult wound care nurse  Dementia with history of depression - Appears to be baseline mental status currently - Continue Aricept, lloperidone, perphenazine, and Seroquel - Some confusion on last admission 2/2 hypercarbia  Malnutrition - Continue Ensure, a case, pro-stat liquid - Tolerating regular diet per her daughter,  continue nectar thick liquids  T2DM - Diet controlled, monitor with daily labs  HTN - Soft tonight with BP 108/52 currently - No blood pressure meds, monitor  FEN/GI: IV NS at 125, Regular diet with nectar thick liquids Prophylaxis: sub q heparin  Disposition: Admit to telemetry for IV antibiotics, respiratory support, and rule out ACS.  History of Present Illness: Katelyn Rojas is a 73 y.o. female presenting with HCAP. She was admitted in February 2015 with similar complaints. Her daughter states that she's had unusual oxygen desaturations throughout the night which have required more oxygen. She states that her saturations went as low as 75% on her BiPAP last night. She is on 2 L nasal cannula chronically at home and she's been getting scheduled albuterol nebulizers every 4 hours for the last 24 hours. She's been on prednisone 10 mg chronically for about one year. She also complains that for the past 2-3 days she's had increased abdominal pain and back pain. She has known vertebral compression fractures. She was seen at her pulmonologists office one day ago where a chest x-ray was performed. Her daughter brought her in today after she had multiple desaturations overnight.   She also complained of mid chest pain throughout the day today. She states that it's a continuous dull nonradiating pain not associated with vomiting, or nausea. She complains of increased diaphoresis for the last 2 months approximately.  She states her abdominal pain was dull and crampy and has improved since it started last night. She and her daughter stated that she's had normal by mouth intake and tolerates a regular diet with nectar thick liquids.    Review Of Systems: Per HPI, Otherwise 12 point review of systems was performed and was unremarkable.  Patient Active Problem  List   Diagnosis Date Noted  . COPD (chronic obstructive pulmonary disease) 01/13/2014  . Dysphagia, unspecified(787.20) 01/13/2014  . Type  II or unspecified type diabetes mellitus without mention of complication, not stated as uncontrolled 01/13/2014  . Chronic dementia 01/13/2014  . Sacral decubitus ulcer, stage III 01/13/2014  . Compression fracture of thoracic vertebra 01/13/2014  . Chronic respiratory failure 01/13/2014  . Protein-calorie malnutrition, severe 12/31/2013  . Underweight 12/31/2013  . Acute respiratory failure 12/26/2013   Past Medical History: Past Medical History  Diagnosis Date  . Hypertension   . COPD (chronic obstructive pulmonary disease)   . Arthritis   . Oxygen dependent   . Anxiety   . Cachexia   . DM type 2 (diabetes mellitus, type 2)    Past Surgical History: Past Surgical History  Procedure Laterality Date  . Vaginal hysterectomy      ? Abdominal vs. Vaginal   Social History: History  Substance Use Topics  . Smoking status: Former Games developer  . Smokeless tobacco: Not on file  . Alcohol Use: Not on file   Additional social history: Please also refer to relevant sections of EMR.  Family History: History reviewed. No pertinent family history. Allergies and Medications: Allergies  Allergen Reactions  . Namenda [Memantine Hcl] Other (See Comments)    "hallucinations"   . Penicillins Other (See Comments)    unknown   No current facility-administered medications on file prior to encounter.   Current Outpatient Prescriptions on File Prior to Encounter  Medication Sig Dispense Refill  . albuterol (PROVENTIL HFA;VENTOLIN HFA) 108 (90 BASE) MCG/ACT inhaler Inhale 1-2 puffs into the lungs every 6 (six) hours as needed for wheezing or shortness of breath.      Marland Kitchen albuterol (PROVENTIL) (2.5 MG/3ML) 0.083% nebulizer solution Take 2.5 mg by nebulization every 4 (four) hours as needed for wheezing or shortness of breath.      . Amino Acids-Protein Hydrolys (FEEDING SUPPLEMENT, PRO-STAT SUGAR FREE 64,) LIQD Take 30 mLs by mouth 2 (two) times daily with a meal.  900 mL  0  .  Dextromethorphan-Guaifenesin (GUAIFENESIN DM) 400-20 MG TABS Take 1 tablet by mouth every 4 (four) hours as needed (congestion).      Marland Kitchen donepezil (ARICEPT) 10 MG tablet Take 10 mg by mouth at bedtime.      . famotidine (PEPCID) 20 MG tablet Take 40 mg by mouth daily.       . Fluticasone-Salmeterol (ADVAIR) 250-50 MCG/DOSE AEPB Inhale 1 puff into the lungs 2 (two) times daily.      . furosemide (LASIX) 20 MG tablet Take 20 mg by mouth daily.      . Iloperidone (FANAPT) 1 MG TABS Take 3 mg by mouth at bedtime.      . megestrol (MEGACE) 40 MG/ML suspension Take 400 mg by mouth daily.      . naproxen sodium (ANAPROX) 220 MG tablet Take 220 mg by mouth 2 (two) times daily as needed (arthritis).      Marland Kitchen perphenazine (TRILAFON) 2 MG tablet Take 6 mg by mouth at bedtime.      Marland Kitchen QUEtiapine (SEROQUEL) 25 MG tablet Take 50 mg by mouth at bedtime.       . traMADol (ULTRAM) 50 MG tablet Take 50 mg by mouth every 6 (six) hours as needed for moderate pain.      . Vitamin D, Ergocalciferol, (DRISDOL) 50000 UNITS CAPS capsule Take 50,000 Units by mouth every 7 (seven) days. Monday  Objective: BP 110/46  Pulse 87  Temp(Src) 99.4 F (37.4 C) (Rectal)  Resp 25  Ht 5' 1.02" (1.55 m)  Wt 86 lb 13.8 oz (39.4 kg)  BMI 16.40 kg/m2  SpO2 92% Exam: Gen: NAD, alert, cooperative with exam, frail appearing elderly female HEENT: NCAT, EOMI, MMM CV: RRR, good S1/S2, no murmur Resp: Good air movement, no wheezes, soft crackles at bilateral bases Abd: Soft, tenderness to palpation diffusely, voluntary guarding, positive bowel sounds Ext: No edema, warm  Neuro: Alert and oriented times person and place, struggles with time. Strength 3/5 in bilateral upper and lower extremities, EOMI, normal speech   Labs and Imaging: CBC BMET   Recent Labs Lab 01/31/14 1540  WBC 12.1*  HGB 11.5*  HCT 38.4  PLT 261    Recent Labs Lab 01/31/14 1540  NA 140  K 4.8  CL 96  CO2 34*  BUN 18  CREATININE 0.61   GLUCOSE 82  CALCIUM 9.3     ProBNP 155.9 Point-of-care troponin 0.00  EKG 01/31/2014: Normal sinus rhythm  Chest x-ray 01/31/2014: IMPRESSION:  New bibasilar pulmonary opacity superimposed on COPD. Pneumonia  cannot be excluded.   Elenora Gamma, MD 01/31/2014, 7:20 PM PGY-2, [facility] FPTS Intern pager: [phone], text pages welcome

## 2014-02-01 ENCOUNTER — Encounter (HOSPITAL_COMMUNITY): Payer: Self-pay | Admitting: Radiology

## 2014-02-01 ENCOUNTER — Inpatient Hospital Stay (HOSPITAL_COMMUNITY): Payer: Medicare Other

## 2014-02-01 DIAGNOSIS — L8993 Pressure ulcer of unspecified site, stage 3: Secondary | ICD-10-CM

## 2014-02-01 DIAGNOSIS — E119 Type 2 diabetes mellitus without complications: Secondary | ICD-10-CM

## 2014-02-01 DIAGNOSIS — S22009A Unspecified fracture of unspecified thoracic vertebra, initial encounter for closed fracture: Secondary | ICD-10-CM

## 2014-02-01 DIAGNOSIS — J9600 Acute respiratory failure, unspecified whether with hypoxia or hypercapnia: Secondary | ICD-10-CM

## 2014-02-01 DIAGNOSIS — L89109 Pressure ulcer of unspecified part of back, unspecified stage: Secondary | ICD-10-CM

## 2014-02-01 DIAGNOSIS — F0390 Unspecified dementia without behavioral disturbance: Secondary | ICD-10-CM

## 2014-02-01 DIAGNOSIS — E43 Unspecified severe protein-calorie malnutrition: Secondary | ICD-10-CM

## 2014-02-01 DIAGNOSIS — I10 Essential (primary) hypertension: Secondary | ICD-10-CM

## 2014-02-01 LAB — CBC
HEMATOCRIT: 33.3 % — AB (ref 36.0–46.0)
Hemoglobin: 10.3 g/dL — ABNORMAL LOW (ref 12.0–15.0)
MCH: 30.5 pg (ref 26.0–34.0)
MCHC: 30.9 g/dL (ref 30.0–36.0)
MCV: 98.5 fL (ref 78.0–100.0)
PLATELETS: 249 10*3/uL (ref 150–400)
RBC: 3.38 MIL/uL — ABNORMAL LOW (ref 3.87–5.11)
RDW: 14.2 % (ref 11.5–15.5)
WBC: 9.3 10*3/uL (ref 4.0–10.5)

## 2014-02-01 LAB — BASIC METABOLIC PANEL
BUN: 16 mg/dL (ref 6–23)
CALCIUM: 8.9 mg/dL (ref 8.4–10.5)
CHLORIDE: 98 meq/L (ref 96–112)
CO2: 34 mEq/L — ABNORMAL HIGH (ref 19–32)
Creatinine, Ser: 0.52 mg/dL (ref 0.50–1.10)
Glucose, Bld: 85 mg/dL (ref 70–99)
Potassium: 4.1 mEq/L (ref 3.7–5.3)
Sodium: 142 mEq/L (ref 137–147)

## 2014-02-01 LAB — TROPONIN I: Troponin I: <0.3 ng/mL (ref <0.30)

## 2014-02-01 LAB — GLUCOSE, CAPILLARY
Glucose-Capillary: 237 mg/dL — ABNORMAL HIGH (ref 70–99)
Glucose-Capillary: 84 mg/dL (ref 70–99)
Glucose-Capillary: 85 mg/dL (ref 70–99)

## 2014-02-01 LAB — HIV ANTIBODY (ROUTINE TESTING W REFLEX): HIV: NONREACTIVE

## 2014-02-01 MED ORDER — IOHEXOL 300 MG/ML  SOLN
80.0000 mL | Freq: Once | INTRAMUSCULAR | Status: AC | PRN
  Administered 2014-02-01: 80 mL via INTRAVENOUS

## 2014-02-01 MED ORDER — IPRATROPIUM-ALBUTEROL 0.5-2.5 (3) MG/3ML IN SOLN
3.0000 mL | Freq: Three times a day (TID) | RESPIRATORY_TRACT | Status: DC
  Administered 2014-02-01 – 2014-02-05 (×12): 3 mL via RESPIRATORY_TRACT
  Filled 2014-02-01 (×12): qty 3

## 2014-02-01 MED ORDER — DEXTROSE 5 % IV SOLN
1.0000 g | Freq: Three times a day (TID) | INTRAVENOUS | Status: DC
  Administered 2014-02-01 – 2014-02-02 (×3): 1 g via INTRAVENOUS
  Filled 2014-02-01 (×5): qty 1

## 2014-02-01 MED ORDER — IOHEXOL 300 MG/ML  SOLN
25.0000 mL | INTRAMUSCULAR | Status: AC
  Administered 2014-02-01 (×2): 25 mL via ORAL

## 2014-02-01 NOTE — Progress Notes (Signed)
Patient Katelyn Rojas      DOB: Dec 02, 1940      VWA:[id number]  Consult for PMT to perform GoC received. We apologize for delay in meeting but will need to assess asap in the am .     Alamin Mccuiston L. Ladona Ridgel, MD MBA The Palliative Medicine Team at [facility] Phone: [phone] Pager: [phone]

## 2014-02-01 NOTE — Progress Notes (Signed)
Family Medicine Teaching Service Daily Progress Note Intern Pager: [phone]  Patient name: Katelyn Rojas Medical record number: [id number] Date of birth: 12/07/1940 Age: 73 y.o. Gender: female  Primary Care Provider: Kirt BoysARTER,[name], MD Consultants: None Code Status: Full  Pt Overview and Major Events to Date:  3/21: Admitted 3/22: Continued back and abd pain  Antibiotics Vanc 3/21>> Aztreonam 3/21>>  Cultures Blood X 2 3/21>> Sputum - needs to be collected  Assessment and Plan: Katelyn Rojas is a 73 y.o. female presenting with HCAP . PMH is significant for Chronic Resp failure 2/2 COPD, malniutrition, Sacral decubitus ulcer, T2DM, and HTN.   HCAP- in setting of COPD, acute on chronic respiratory failure  - With recent hospitalization in February  - Vanc and aztreonam per pharmacy (penicillin allergic) >> continue for 24 hours - Blood culture, sputum culture and Gram stain, HIV, Legionella and strep urine antigen - pending - Chronic steroid use, increased to prednisone 50 mg daily X 5-7 days(received 125 mg Solu-Medrol in ED)  - Duo neb every 4 hours, albuterol neb every 2 when necessary   Chest pain - Atypical chest pain - Trop neg X 3 - EKG with non specific T wave inversions in V1 and V2, V2 not inverted on initial EKG  Metabolic alkalosis  - Likely compensation from chronic respiratory acidosis caused by chronic CO2 retention  - Monitor BMP - pending this am  Abdominal pain  - impressive Tenderness to palation anywhere,. No other GI complaints  - doubt incarcerated hernia  - consider CT abd with  Back pain  - likely compression fracture related  - tylenol, calcitonin   Sacral decubitus ulcer  - Likely that healing is inhibited by chronic steroid use, continue wound VAC  - Consult wound care nurse   Dementia with history of depression  - Appears to be baseline mental status currently  - Continue Aricept, lloperidone, perphenazine, and Seroquel  - Some  confusion on last admission 2/2 hypercarbia   Malnutrition  - Continue Ensure, megase, pro-stat liquid  - Tolerating regular diet per her daughter, continue nectar thick liquids   T2DM  - Diet controlled, monitor with daily labs   HTN  - 110/47 currently  - No blood pressure meds, monitor   FEN/GI: IV NS at 125, Regular diet with nectar thick liquids  Prophylaxis: sub q heparin   Disposition: Admit to telemetry for IV antibiotics, respiratory support, and rule out ACS.  Subjective:  Patient with no acute events overnight. Resting comfortably with BiPAP on. Sattes she still has same back and abd pain.   Objective: Temp:  [98.6 F (37 C)-99.4 F (37.4 C)] 98.9 F (37.2 C) (03/22 0500) Pulse Rate:  [79-97] 97 (03/22 0500) Resp:  [14-30] 18 (03/22 0500) BP: (101-119)/(44-57) 110/47 mmHg (03/22 0500) SpO2:  [89 %-100 %] 100 % (03/22 0500) Weight:  [86 lb 13.8 oz (39.4 kg)-90 lb 12.8 oz (41.187 kg)] 90 lb 12.8 oz (41.187 kg) (03/21 2130) Physical Exam: Gen: NAD, alert, resting but easily arousable HEENT: NCAT, BiPAP in place CV: RRR, good S1/S2, no murmur Resp: good air movement, some soft crackles at L base (dependent, pt on L Side) NGE:XBMWAbd:soft, exquisitely tender throughout, voluntary guarding Ext: No edema, warm Neuro: Alert, limited as she was resting    Laboratory:  Recent Labs Lab 01/31/14 1540  WBC 12.1*  HGB 11.5*  HCT 38.4  PLT 261    Recent Labs Lab 01/31/14 1540  NA 140  K 4.8  CL 96  CO2  34*  BUN 18  CREATININE 0.61  CALCIUM 9.3  GLUCOSE 82    ProBNP 155.9    Recent Labs Lab 01/31/14 1929 02/01/14 0138 02/01/14 0745  TROPONINI <0.30 <0.30 <0.30    Imaging/Diagnostic Tests: EKG 01/31/2014: Normal sinus rhythm with non specific inverted T wave in V1 EKG 02/01/2014 NSR with nonspecific inverted T wave in V1 and V2  Chest x-ray 01/31/2014:  IMPRESSION:  New bibasilar pulmonary opacity superimposed on COPD. Pneumonia  cannot be  excluded.   Elenora Gamma, MD 02/01/2014, 9:00 AM PGY-2, [facility] FPTS Intern pager: [phone], text pages welcome

## 2014-02-01 NOTE — Progress Notes (Signed)
ANTIBIOTIC CONSULT NOTE - FOLLOW UP  Pharmacy Consult for azactam, vancomycin Indication: HCAP  Allergies  Allergen Reactions  . Namenda [Memantine Hcl] Other (See Comments)    "hallucinations"   . Penicillins Other (See Comments)    unknown    Patient Measurements: Height: 5' 1.02" (155 cm) Weight: 90 lb 12.8 oz (41.187 kg) IBW/kg (Calculated) : 47.86  Vital Signs: Temp: 98.9 F (37.2 C) (03/22 0500) BP: 110/47 mmHg (03/22 0500) Pulse Rate: 97 (03/22 0500) Intake/Output from previous day: 03/21 0701 - 03/22 0700 In: 330 [P.O.:120; I.V.:60; IV Piggyback:150] Out: -  Intake/Output from this shift:    Labs:  Recent Labs  01/31/14 1540 02/01/14 1130  WBC 12.1* 9.3  HGB 11.5* 10.3*  PLT 261 249  CREATININE 0.61 0.52   Estimated Creatinine Clearance: 42 ml/min (by C-G formula based on Cr of 0.52). No results found for this basename: VANCOTROUGH, VANCOPEAK, VANCORANDOM, GENTTROUGH, GENTPEAK, GENTRANDOM, TOBRATROUGH, TOBRAPEAK, TOBRARND, AMIKACINPEAK, AMIKACINTROU, AMIKACIN,  in the last 72 hours   Microbiology: No results found for this or any previous visit (from the past 720 hour(s)).  Anti-infectives   Start     Dose/Rate Route Frequency Ordered Stop   01/31/14 1845  vancomycin (VANCOCIN) 750 mg in sodium chloride 0.9 % 150 mL IVPB     750 mg 150 mL/hr over 60 Minutes Intravenous Every 24 hours 01/31/14 1840     01/31/14 1800  aztreonam (AZACTAM) 2 g in dextrose 5 % 50 mL IVPB     2 g 100 mL/hr over 30 Minutes Intravenous  Once 01/31/14 1752 01/31/14 2145      Assessment: 73 yo female with HCAP to continue on vancomycin and azactam. WBC= 9.3, afebrile, SCr= 0.52 and CrCl ~40.    3/22 azactam>> 3/22 vanc>>  3/21 blood x2  Goal of Therapy:  Vancomycin trough level 15-20 mcg/ml  Plan:  -Azactam 1gm IV q8h -Continue vancomycin 750mg  IV q24h -Will follow renal function, cultures and clinical progress  Harland German, Pharm D 02/01/2014 1:12 PM

## 2014-02-01 NOTE — H&P (Addendum)
FMTS Attending Note  I personally saw and evaluated the patient. The plan of care was discussed with the resident team. I agree with the assessment and plan as documented by the resident.   73 y/o female with PMH Chronic Respiratory Failure, COPD, malnutrition, Sacral decubitus ulcer, T2DM, and HTN presented in acute respiratory failure due to suspected HCAP. Patient currently sleeping with CPAP and unable to provided further HPI due to dememtia. Please refer to resident note for additional HPI. No family members present at bedside.   Vitals: reviewed Gen: AAF, sleeping, wearing CPAP, opens eyes and speaks with palpation of abdomen HEENT: normocephalic, dry MMM, neck supple Cardiac: difficult exam due to transmitted sounds from CPAP, S1 and S2 present, no murmurs Resp: Coarse bilaterally, no cough, normal work of breathing Abd: soft, diffusely tender, no distension, patient awakes upon pressing on her abdomen Ext: no edema, 2+ radial pulses bilaterally Skin: wound vac on sacral area, no other rashes noted  Reviewed lab work and imaging.  Assessment and Plan. 73 y/o female presents with acute on chronic respiratory failure due to HCAP vs COPD exacerbation. 1. Acute on Chronic Respiratory Failure -  Agree with empiric coverage for HCAP with Vancomycin and Aztreonam, favors COPD exacerbation as WBC only mildly elevated (on chronic steroids) and afebrile, check blood and sputum cultures, CPAP when sleeping, agree with Prednisone 50 mg daily 2. Chest pain - agree with trending troponins 3. Abdominal pain - unclear source, patient unable to provide significant HPI, will check CT Abd/pelvis to rule out acute pathology, check UA 4. COPD with hypercarbia - agree with Prednisone and home breathing treatments 5. HTN - hold home meds until blood pressures improve 6. Protein Malnutrition - nutritional supplements with meals, suspect due to longstanding COPD 7. Dementia/Depression - restart home meds 8.  Diet controlled diabetes - monitor blood sugars while on Prednisone  Donnella Sham MD

## 2014-02-01 NOTE — Progress Notes (Signed)
FMTS Attending Note  I personally saw and evaluated the patient. The plan of care was discussed with the resident team. I agree with the assessment and plan as documented by the resident.   Please refer to H&P note completed today.  Shacora Zynda MD 

## 2014-02-02 DIAGNOSIS — K625 Hemorrhage of anus and rectum: Secondary | ICD-10-CM

## 2014-02-02 DIAGNOSIS — R933 Abnormal findings on diagnostic imaging of other parts of digestive tract: Secondary | ICD-10-CM | POA: Diagnosis present

## 2014-02-02 LAB — CBC
HCT: 35 % — ABNORMAL LOW (ref 36.0–46.0)
Hemoglobin: 10.7 g/dL — ABNORMAL LOW (ref 12.0–15.0)
MCH: 30.3 pg (ref 26.0–34.0)
MCHC: 30.6 g/dL (ref 30.0–36.0)
MCV: 99.2 fL (ref 78.0–100.0)
PLATELETS: 248 10*3/uL (ref 150–400)
RBC: 3.53 MIL/uL — ABNORMAL LOW (ref 3.87–5.11)
RDW: 14.5 % (ref 11.5–15.5)
WBC: 6.5 10*3/uL (ref 4.0–10.5)

## 2014-02-02 LAB — BASIC METABOLIC PANEL
BUN: 20 mg/dL (ref 6–23)
CO2: 34 mEq/L — ABNORMAL HIGH (ref 19–32)
CREATININE: 0.5 mg/dL (ref 0.50–1.10)
Calcium: 8.8 mg/dL (ref 8.4–10.5)
Chloride: 101 mEq/L (ref 96–112)
GFR calc non Af Amer: >90 mL/min (ref >90)
GLUCOSE: 85 mg/dL (ref 70–99)
POTASSIUM: 4.3 meq/L (ref 3.7–5.3)
Sodium: 143 mEq/L (ref 137–147)

## 2014-02-02 LAB — GLUCOSE, CAPILLARY
GLUCOSE-CAPILLARY: 167 mg/dL — AB (ref 70–99)
Glucose-Capillary: 90 mg/dL (ref 70–99)
Glucose-Capillary: 202 mg/dL — ABNORMAL HIGH (ref 70–99)

## 2014-02-02 MED ORDER — HYDROCODONE-ACETAMINOPHEN 7.5-325 MG/15ML PO SOLN
10.0000 mL | ORAL | Status: DC | PRN
  Administered 2014-02-02 – 2014-02-03 (×3): 10 mL via ORAL
  Filled 2014-02-02 (×3): qty 15

## 2014-02-02 MED ORDER — LEVOFLOXACIN 750 MG PO TABS
750.0000 mg | ORAL_TABLET | Freq: Every day | ORAL | Status: DC
  Filled 2014-02-02: qty 1

## 2014-02-02 MED ORDER — SENNOSIDES-DOCUSATE SODIUM 8.6-50 MG PO TABS: 1.0000 | ORAL_TABLET | Freq: Two times a day (BID) | ORAL | Status: DC

## 2014-02-02 MED ORDER — AZTREONAM 1 G IJ SOLR
1.0000 g | Freq: Three times a day (TID) | INTRAMUSCULAR | Status: DC
  Administered 2014-02-02 – 2014-02-04 (×6): 1 g via INTRAVENOUS
  Filled 2014-02-02 (×8): qty 1

## 2014-02-02 MED ORDER — SENNOSIDES-DOCUSATE SODIUM 8.6-50 MG PO TABS
1.0000 | ORAL_TABLET | Freq: Two times a day (BID) | ORAL | Status: DC
Start: 2014-02-02 — End: 2014-02-05
  Administered 2014-02-02 – 2014-02-05 (×7): 1 via ORAL
  Filled 2014-02-02 (×8): qty 1

## 2014-02-02 MED ORDER — VANCOMYCIN HCL 1000 MG IV SOLR
750.0000 mg | INTRAVENOUS | Status: DC
  Administered 2014-02-02 – 2014-02-03 (×2): 750 mg via INTRAVENOUS
  Filled 2014-02-02 (×3): qty 750

## 2014-02-02 NOTE — Consult Note (Signed)
[facility] Consult: 1:36 PM 02/02/2014  LOS: 2 days    Referring Provider: Drs Druscilla Brownie and Ermalinda Memos of [facility].  Primary Care Physician:  Kirt Boys, MD, [facility].  Primary Gastroenterologist:  none     Reason for Consultation:  Abnormal gastric CT and abdominal pain   HPI: Katelyn Rojas is a 73 y.o. female.  Has O2 dependent/nightly bipap requiring COPD, type 2 DM (no meds for this PTA),   Malnutrition/cachexia, dementia.  On iron along with food thickener, megace at home.   Admitted 2 days ago with HCAP and sacral decubitus ulcer.   Admission 12/26/13 - 12/31/13 with HCAP, compression fractures of spine.  During that admission SLP advised Dysphagia 2, nectar diet due to:     " primary oral dysphagia complicated by respiratory fatigue with PO intake and weak cough response. The pt has a very slow        oral mechanism with weak lingual pumping to slowly transit bolus in piecemeal fashion. This leads to premature spillage to          pyriforms with thin liquids. Delay in swallow progressively increases with time and respiratory fatigue, and shortness of                breath quickly begins after 2 swallows. The pt has intermittent sensation of aspiration and even with cough attempt does not       have the force to expel penetrate or aspirate"  Complaint of abdominal pain led to CT abdomen.  This showing ? Polypoid thickening of proximal gastric wall, ? rectal prolapse and stool in colon, new L3 and milder L1 compression fractures.  Has borderline macrocytic anemia, preserved renal function.   No records of previous EGD or colonoscopies in Epic. Pt does not recall these tests having been done.   She confirms laxative requiring constipation and says "powder" has helped in past.  BMs  2 to 3 times per week.  Pain in abdomen present for at least a few weeks, not aggravated by meals.  Pain is in mid abdomen.  No nausea or vomiting.  No bloody or tarry stools.      Past Medical History  Diagnosis Date  . Hypertension   . COPD (chronic obstructive pulmonary disease)   . Arthritis   . Oxygen dependent   . Anxiety   . Cachexia   . DM type 2 (diabetes mellitus, type 2)     Past Surgical History  Procedure Laterality Date  . Vaginal hysterectomy      ? Abdominal vs. Vaginal    Prior to Admission medications   Medication Sig Start Date End Date Taking? Authorizing Provider  albuterol (PROVENTIL HFA;VENTOLIN HFA) 108 (90 BASE) MCG/ACT inhaler Inhale 1-2 puffs into the lungs every 6 (six) hours as needed for wheezing or shortness of breath.   Yes Historical Provider, MD  albuterol (PROVENTIL) (2.5 MG/3ML) 0.083% nebulizer solution Take 2.5 mg by nebulization every 4 (four) hours as needed for wheezing or shortness of breath.   Yes Historical Provider,  MD  Amino Acids-Protein Hydrolys (FEEDING SUPPLEMENT, PRO-STAT SUGAR FREE 64,) LIQD Take 30 mLs by mouth 2 (two) times daily with a meal. 12/31/13  Yes Saralyn PilarAlexander Karamalegos, DO  citalopram (CELEXA) 10 MG/5ML suspension Take 15 mg by mouth daily.   Yes Historical Provider, MD  Dextromethorphan-Guaifenesin (GUAIFENESIN DM) 400-20 MG TABS Take 1 tablet by mouth every 4 (four) hours as needed (congestion).   Yes Historical Provider, MD  donepezil (ARICEPT) 10 MG tablet Take 10 mg by mouth at bedtime.   Yes Historical Provider, MD  ENSURE PLUS (ENSURE PLUS) LIQD Take 237 mLs by mouth 2 (two) times daily between meals.   Yes Historical Provider, MD  famotidine (PEPCID) 20 MG tablet Take 40 mg by mouth daily.    Yes Historical Provider, MD  ferrous sulfate 325 (65 FE) MG tablet Take 325 mg by mouth 3 (three) times daily with meals.   Yes Historical Provider, MD  Fluticasone-Salmeterol (ADVAIR) 250-50 MCG/DOSE AEPB Inhale 1 puff into the  lungs 2 (two) times daily.   Yes Historical Provider, MD  food thickener (SIMPLYTHICK) POWD Take 1 packet by mouth as needed (nectar).   Yes Historical Provider, MD  furosemide (LASIX) 20 MG tablet Take 20 mg by mouth daily.   Yes Historical Provider, MD  Iloperidone (FANAPT) 1 MG TABS Take 3 mg by mouth at bedtime.   Yes Historical Provider, MD  megestrol (MEGACE) 40 MG/ML suspension Take 400 mg by mouth daily.   Yes Historical Provider, MD  naproxen sodium (ANAPROX) 220 MG tablet Take 220 mg by mouth 2 (two) times daily as needed (arthritis).   Yes Historical Provider, MD  perphenazine (TRILAFON) 2 MG tablet Take 6 mg by mouth at bedtime.   Yes Historical Provider, MD  predniSONE (DELTASONE) 10 MG tablet Take 10 mg by mouth daily with breakfast.   Yes Historical Provider, MD  QUEtiapine (SEROQUEL) 25 MG tablet Take 50 mg by mouth at bedtime.    Yes Historical Provider, MD  traMADol (ULTRAM) 50 MG tablet Take 50 mg by mouth every 6 (six) hours as needed for moderate pain.   Yes Historical Provider, MD  Vitamin D, Ergocalciferol, (DRISDOL) 50000 UNITS CAPS capsule Take 50,000 Units by mouth every 7 (seven) days. Monday   Yes Historical Provider, MD    Scheduled Meds: . calcitonin (salmon)  1 spray Alternating Nares Daily  . citalopram  15 mg Oral Daily  . donepezil  10 mg Oral QHS  . famotidine  40 mg Oral Daily  . feeding supplement (ENSURE COMPLETE)  237 mL Oral BID BM  . feeding supplement (PRO-STAT SUGAR FREE 64)  30 mL Oral BID WC  . ferrous sulfate  325 mg Oral TID WC  . furosemide  20 mg Oral Q breakfast  . heparin  5,000 Units Subcutaneous 3 times per day  . Iloperidone  3 mg Oral QHS  . ipratropium-albuterol  3 mL Nebulization TID  . levofloxacin  750 mg Oral Daily  . megestrol  400 mg Oral Daily  . mometasone-formoterol  2 puff Inhalation BID  . perphenazine  6 mg Oral QHS  . predniSONE  50 mg Oral Q breakfast  . QUEtiapine  50 mg Oral QHS  . senna-docusate  1 tablet Oral BID   . sodium chloride  3 mL Intravenous Q12H  . Vitamin D (Ergocalciferol)  50,000 Units Oral Q7 days   Infusions:   PRN Meds: sodium chloride, albuterol, HYDROcodone-acetaminophen, morphine injection, RESOURCE THICKENUP CLEAR, sodium chloride, traMADol  Allergies as of 01/31/2014 - Review Complete 01/31/2014  Allergen Reaction Noted  . Namenda [memantine hcl] Other (See Comments) 12/26/2013  . Penicillins Other (See Comments) 12/26/2013    History reviewed. No pertinent family history.  History   Social History  . Marital Status: Legally Separated    Spouse Name: N/A    Number of Children: N/A  . Years of Education: N/A   Occupational History  . Not on file.   Social History Main Topics  . Smoking status: Former Games developer  . Smokeless tobacco: Not on file  . Alcohol Use: Not on file  . Drug Use: Not on file  . Sexual Activity: Not on file   Other Topics Concern  . Not on file   Social History Narrative   Patient lives with her niece, [name].   The patient will report that she lives with her older sister, but this is a sign of her     REVIEW OF SYSTEMS: Constitutional:  16 #, BMI 16 on 01/13/14.  Currently at 90 # ENT:  No nose bleeds Pulm:  No cough or dyspnea, did have this at home3 CV:  No palpitations, no LE edema.  GU:  No hematuria, no frequency GI:  Per HPI.   Heme:  No recall of anemia   Transfusions:  No recall Neuro:  No headaches, no peripheral tingling or numbness Derm:  No itching, no rash or sores.  Endocrine:  No sweats or chills.  No polyuria or dysuria Immunization:  Not aware of vaccination status.  Travel:  None beyond local counties in last few months.    PHYSICAL EXAM: Vital signs in last 24 hours: Filed Vitals:   02/02/14 1319  BP:   Pulse: 90  Temp:   Resp: 18   Wt Readings from Last 3 Encounters:  01/31/14 41.187 kg (90 lb 12.8 oz)  01/13/14 39.418 kg (86 lb 14.4 oz)  12/27/13 39.4 kg (86 lb 13.8 oz)   General: cachectic AAF  who is pleasant and alert Head:  No swelling or asymmetry  Eyes:  No icterus or pallor Ears:  Not HOH.  Nose:  No discharge or congestion Mouth:  Poor dentition with caries, missing teeth.  No lesions, no bleeding.  MM moist Neck:  No mass or JVD Lungs:  Fine rales on right.  No ronchi, no dyspnea.  + cough Heart: RRR.  No MRG. Abdomen:  Soft, diffusely tender without guard or rebound.  BS present.  Slightly firm but no masses.   Rectal: soft large brown stool in rectal vault.  Is FOBT negative.  Sacral decub covered with wound vac.  Musc/Skeltl: no joint swelling.  Spine kyphotic.  Looks osteoporotic Extremities:  No pedal edema .  Feet warm  Neurologic:  Appropriate, follows commands.  Thinks she is in [place] (where she tells me she used to live and work for [employer]).  No tremor.  Moves all 4 limbs, strength not tested.  Skin:  No rash.   Tattoos:  none Nodes:  No inguinal adenopathy   Psych:  Pleasant, cooperative, not agitated. Not vegetative.   Intake/Output from previous day: 03/22 0701 - 03/23 0700 In: 220 [P.O.:120; IV Piggyback:100] Out: -  Intake/Output this shift:    LAB RESULTS:  Recent Labs  01/31/14 1540 02/01/14 1130 02/02/14 0635  WBC 12.1* 9.3 6.5  HGB 11.5* 10.3* 10.7*  HCT 38.4 33.3* 35.0*  PLT 261 249 248   BMET Lab Results  Component Value Date  NA 143 02/02/2014   NA 142 02/01/2014   NA 140 01/31/2014   K 4.3 02/02/2014   K 4.1 02/01/2014   K 4.8 01/31/2014   CL 101 02/02/2014   CL 98 02/01/2014   CL 96 01/31/2014   CO2 34* 02/02/2014   CO2 34* 02/01/2014   CO2 34* 01/31/2014   GLUCOSE 85 02/02/2014   GLUCOSE 85 02/01/2014   GLUCOSE 82 01/31/2014   BUN 20 02/02/2014   BUN 16 02/01/2014   BUN 18 01/31/2014   CREATININE 0.50 02/02/2014   CREATININE 0.52 02/01/2014   CREATININE 0.61 01/31/2014   CALCIUM 8.8 02/02/2014   CALCIUM 8.9 02/01/2014   CALCIUM 9.3 01/31/2014   LFT No results found for this basename: PROT, ALBUMIN, AST, ALT, ALKPHOS,  BILITOT, BILIDIR, IBILI,  in the last 72 hours PT/INR No results found for this basename: INR, PROTIME    RADIOLOGY STUDIES: Ct Abdomen Pelvis W Contrast 02/02/2014    COMPARISON:  CT-ABDOMEN AND PELVIS W/ CONTRAST dated 03/01/2013; DG CHEST 2V dated 12/14/2013; XR-ABDOMEN-KUB/FLAT PLATE dated 11/27/7260  FINDINGS: Lung bases show volume loss in the dependent portions of both lower lobes. Emphysema. Tiny left pleural effusion. Heart size within normal limits. No pericardial effusion.  Scattered low attenuation lesions in the liver measure up to 7 mm, as before. Intrahepatic and extrahepatic biliary duct dilatation appears unchanged. Gallbladder and adrenal glands are unremarkable. There is a dilated upper pole calyx with overlying scarring. The right renal collecting system does not appear duplicated, however. Sub cm low-attenuation lesion in the lower pole right kidney is unchanged and likely a cyst. Left kidney, spleen and pancreas are unremarkable. Possible polypoid gastric wall thickening proximally (series 2, image 13). Finding persists on nephrographic phase imaging. Small bowel is unremarkable. Stool is seen in the majority of the colon. Appendix is unremarkable. Question rectal prolapse. Bladder is unremarkable.  Hysterectomy. Atherosclerotic calcification of the arterial vasculature without abdominal aortic aneurysm. No pathologically enlarged lymph nodes. No free fluid. Periumbilical hernia contains fat. There is soft tissue thickening overlying the lower sacrum (series 2, image 56). Old right superior and inferior pubic rami fractures. L3 compression fracture is new from 03/01/2013. Suspect mild compression of the L1 superior endplate as well.  IMPRESSION: 1. Question polypoid thickening involving the proximal gastric wall. 2. Stool throughout the colon is indicative of constipation. Suspect associated rectal prolapse. 3. Tiny left pleural effusion. 4. Soft tissue thickening overlying the lower sacrum  is in keeping with the given history of a sacral decubitus ulcer. 5. L3 compression fracture is new from 03/01/2013. Suspect mild compression of the L1 superior endplate as well.   Electronically Signed   By: Leanna Battles M.D.   On: 02/02/2014 08:36   Dg Chest Portable 1 View 01/31/2014   CLINICAL DATA:  Shortness of breath.  COPD.  Hypertension.  EXAM: PORTABLE CHEST - 1 VIEW  COMPARISON:  01/30/14  FINDINGS: Changes of COPD again noted. New opacity seen in both lung bases. No definite pleural effusion seen. Heart size is stable.  IMPRESSION: New bibasilar pulmonary opacity superimposed on COPD. Pneumonia cannot be excluded.   Electronically Signed   By: Myles Rosenthal M.D.   On: 01/31/2014 17:31    ENDOSCOPIC STUDIES: None  IMPRESSION:   *  Abdominal pain.  CT with thickened gastric wall.  Rule out cancer.   *  HCAP, baseline oxygen and nocturnal bipap requiring COPD.  HCAP admission for 5 days in 12/2013. On prednisone taper and Azactam/Levaquin. Marland Kitchen   *  Borderline macrocytic anemia.  No anemia studies in Epic, on chronic Iron. FOBT negative by my exam today.   *  Cachexia and weight loss.  Improved in last several weeks, weight increased.   *  Dementia.   *  Stage 4 sacral decubitus ulcer. Wound vac in place.   *  DM type 2. No meds for this on PTA list.     PLAN:     *  EGD?  Will d/w Dr Leone Payor.  Can do this tomorrow, as she is currently in midst of lunch though not eating much. *  Change diet to the D 2, nectars, right now getting regular/thin liquids.      Jennye Moccasin  02/02/2014, 1:36 PM Pager: [phone]  [facility] Attending  I have also seen and assessed the patient and agree with the above note. I have reviewed CT images and see what the radiologist is describing - could also be retained food?  She is too frail to pursue an EGD right now - would not do without MAC sedation and could even need intubation which may not be necessary depending upon what goals of care  are - conference pending. Patient understands. Note apparently also had small rectal bleeding after my PA's visit today (heme neg) Would not do lower endoscopy now, either.  Will f/u 1-2 days.  Iva Boop, MD, [facility] [phone] (pager) 02/02/2014 4:45 PM

## 2014-02-02 NOTE — Progress Notes (Signed)
RT placed patient on her home cpap, via full face mask with 3lpm 02 bleed in. Patient is tolerating cpap well at this time.

## 2014-02-02 NOTE — Progress Notes (Signed)
FMTS Attending Daily Note: Dilyn Osoria MD [phone] pager office [phone] I  have seen and examined this patient, reviewed their chart. I have discussed this patient with the resident. I agree with the resident's findings, assessment and care plan. 

## 2014-02-02 NOTE — Consult Note (Signed)
WOC wound consult note Reason for Consult: evaluation and change of NPWT VAC dressing.  Pt with chronic sacral pressure ulcer (Stage IV) with VAC in place as an outpatient.  Wound type: Sacral pressure ulcer/Stage IV Pressure Ulcer POA: Yes Measurement:4.0cm x 3.0cm x 1.5cm with undermining circumferentially aprox. 1.5 cm   Wound bed: pale, non granular with bone just under the wound tissue Drainage (amount, consistency, odor) moderate, serosanguinous, non foul Periwound: macerated, dressing was saturated with urine Dressing procedure/placement/frequency: 1pc of black granufoam used to fill the wound bed, tucking the foam under the undermined section.  Bridge with skin protected under it with drape to the left hip.  Seal obtained at 125 mmHG, pt tolerated well. She does moan when you turn her.  WOC will follow along with you for [facility] assistance as needed. Tiajah Oyster [place] RN,CWOCN [phone]

## 2014-02-02 NOTE — Progress Notes (Addendum)
Family Medicine Teaching Service Daily Progress Note Intern Pager: [phone]  Patient name: Katelyn Rojas Medical record number: [id number] Date of birth: Jul 31, 1941 Age: 73 y.o. Gender: female  Primary Care Provider: Kirt Boys, MD Consultants: Palliative care, GI Code Status: Full  Pt Overview and Major Events to Date:  3/21: Admitted 3/22: Continued back and abd pain 3/23: CT Abd with ? Polypoid gastric wall thickening  Antibiotics Vanc 3/21>> Aztreonam 3/21>>  Cultures Blood X 2 3/21>> Sputum - needs to be collected  Assessment and Plan: Katelyn Rojas is a 73 y.o. female presenting with HCAP, back, abdominal and chest pain. PMH is significant for Chronic Resp failure 2/2 COPD, malniutrition, Sacral decubitus ulcer, T2DM, and HTN.   # HCAP - in setting of COPD, acute on chronic respiratory failure  - Recent hospitalization in February  - Vanc and aztreonam per pharmacy (penicillin allergic) >> Continue due to increased WOB - Blood culture, sputum culture and Gram stain, Legionella and strep urine antigen - pending - Chronic steroid use, increased to prednisone 50 mg daily X 5-7 days(received 125 mg Solu-Medrol in ED)  - Duo neb every 4 hours, albuterol neb every 2 when necessary   # Abdominal pain  - impressive tenderness to palation anywhere,. No other GI complaints  - doubt incarcerated hernia  - CT abd with ? Polypoid gastric wall thickening - consult GI today - Palliative consult   # Back pain  - likely compression fracture related, CT abd shows new L3 compression fx - tylenol, calcitonin - Add opioid analgesic (Hycet: hydrocodone-acetaminophen 7.5/325 solution)  # Metabolic alkalosis  - Likely compensation from chronic respiratory acidosis caused by chronic CO2 retention  - Monitor BMP  # Sacral decubitus ulcer  - Likely that healing is inhibited by chronic steroid use, continue wound VAC  - Consult wound care nurse   # Constipation: typically  takes home laxative - will start Senna  # Chest pain - Atypical chest pain. Resolved. - Trop neg X 3 - EKG with non specific T wave inversions in V1 and V2, V2 not inverted on initial EKG  # Dementia with history of depression  - Appears to be baseline mental status currently  - Continue Aricept, lloperidone, perphenazine, and Seroquel  - Some confusion on last admission 2/2 hypercarbia   # Malnutrition  - Continue Ensure, megase, pro-stat liquid  - Tolerating regular diet per her daughter, continue nectar thick liquids   # T2DM  - Diet controlled, monitor with daily labs   # HTN  - 100s/50s currently  - No blood pressure meds, monitor   FEN/GI: IV NS at 125, Regular diet with nectar thick liquids  Prophylaxis: sub q heparin   Disposition: continued inpatient stay for further GI workup  Subjective:  Continued belly and back pain. States her chest feels better today. Discussed palliative care was going to visit today.   Objective: Temp:  [97.3 F (36.3 C)] 97.3 F (36.3 C) (03/23 0540) Pulse Rate:  [82-104] 104 (03/23 0903) Resp:  [18-20] 18 (03/23 0903) BP: (103)/(57) 103/57 mmHg (03/23 0540) SpO2:  [95 %-100 %] 100 % (03/23 0903) Physical Exam: Gen: elderly and frail woman lying in bed in NAD, easily arousable HEENT: has nasal cannula CV: RRR, normal S1 and S2, no murmurs Resp: good air movement, mild use of accessory muscles during breathing  Abd: soft, exquisitely tender throughout, voluntary guarding Ext: No edema, warm to touch Neuro: Alert, oriented to person, place but not time ("February")  Laboratory:  Recent  Labs Lab 01/31/14 1540 02/01/14 1130 02/02/14 0635  WBC 12.1* 9.3 6.5  HGB 11.5* 10.3* 10.7*  HCT 38.4 33.3* 35.0*  PLT 261 249 248    Recent Labs Lab 01/31/14 1540 02/01/14 1130 02/02/14 0635  NA 140 142 143  K 4.8 4.1 4.3  CL 96 98 101  CO2 34* 34* 34*  BUN 18 16 20   CREATININE 0.61 0.52 0.50  CALCIUM 9.3 8.9 8.8  GLUCOSE 82 85  85   Imaging/Diagnostic Tests: CT abd pelvis with contrast 02/01/2014: IMPRESSION:  1. Question polypoid thickening involving the proximal gastric wall.  2. Stool throughout the colon is indicative of constipation. Suspect associated rectal prolapse.  3. Tiny left pleural effusion.  4. Soft tissue thickening overlying the lower sacrum is in keeping with the given history of a sacral decubitus ulcer.  5. L3 compression fracture is new from 03/01/2013. Suspect mild compression of the L1 superior endplate as well.  Katelyn Rojas, Med Student 02/02/2014, 12:23 PM [facility] FPTS Intern pager: [phone], text pages welcome  I have seen and examined the patient with Student Dr. Lodema HongSimpson and agree with his documentation above. My annotations are in blue and findings are summarized below.   S: Feels some better but cannot pinpoint in what way she has improved. Continued abd and back pain, tolerating PO.   O: Filed Vitals:   02/02/14 1350  BP: 104/53  Pulse: 102  Temp: 97.6 F (36.4 C)  Resp: 18   Gen: NAD, alert, cooperative with exam, cachectic appearing.  HEENT: NCAT, pursxed lip breathing, somewhat dry mucous membranes CV: RRR, good S1/S2, no murmur Resp: CTABL, no wheezes, mild to moderate increased WOB Abd: soft but tenderness to very mild palpation, hernia midline is soft and not erythematous, unable to palpate deeply enough to reduce it Ext: No edema, warm Neuro: Alert and oriented to person and place, struggles with time.    A/P 73 y/o female with COPD on chronic steroids here with HCAP/COPD exacerbation also with abd and back pain. CT abdomen one day ago shows new compression fractures from her last imaging as well as polypoid thickening of her stomach lining concerning for cancer. From a respiratory standpoint she has a slightly increased O2 need from her baseline but is having increased WOB which is worsened from admission. We have started calcitonin and PRN  [facility] last 4 weeks. We have called GI with the question of wether an EGD for biopsies of her stomach lining would be helpful. Palliative care has been consulted to help us with goals of care considering her chronic Resp failure, frequesnt pneumonias and/or COPD exacerbations and new concern for stomach cancer.   We will continue vanc and aztreonam for HCAP for 1 more day at least considering her increased WOB and await GO reccs.   Murtis SinkSam Bradshaw, MD [facility] Resident, PGY-2 02/02/2014, 2:38 PM

## 2014-02-03 ENCOUNTER — Inpatient Hospital Stay (HOSPITAL_COMMUNITY): Payer: Medicare Other

## 2014-02-03 ENCOUNTER — Encounter (HOSPITAL_COMMUNITY): Payer: Self-pay | Admitting: Physician Assistant

## 2014-02-03 DIAGNOSIS — R933 Abnormal findings on diagnostic imaging of other parts of digestive tract: Secondary | ICD-10-CM

## 2014-02-03 DIAGNOSIS — J189 Pneumonia, unspecified organism: Principal | ICD-10-CM

## 2014-02-03 DIAGNOSIS — J449 Chronic obstructive pulmonary disease, unspecified: Secondary | ICD-10-CM

## 2014-02-03 DIAGNOSIS — J9610 Chronic respiratory failure, unspecified whether with hypoxia or hypercapnia: Secondary | ICD-10-CM

## 2014-02-03 DIAGNOSIS — R5383 Other fatigue: Secondary | ICD-10-CM

## 2014-02-03 DIAGNOSIS — R5381 Other malaise: Secondary | ICD-10-CM

## 2014-02-03 LAB — BASIC METABOLIC PANEL
BUN: 22 mg/dL (ref 6–23)
CO2: 35 meq/L — AB (ref 19–32)
CREATININE: 0.51 mg/dL (ref 0.50–1.10)
Calcium: 9.1 mg/dL (ref 8.4–10.5)
Chloride: 99 mEq/L (ref 96–112)
GFR calc non Af Amer: >90 mL/min (ref >90)
Glucose, Bld: 88 mg/dL (ref 70–99)
Potassium: 4.4 mEq/L (ref 3.7–5.3)
SODIUM: 144 meq/L (ref 137–147)

## 2014-02-03 LAB — GLUCOSE, CAPILLARY
GLUCOSE-CAPILLARY: 132 mg/dL — AB (ref 70–99)
GLUCOSE-CAPILLARY: 163 mg/dL — AB (ref 70–99)
Glucose-Capillary: 174 mg/dL — ABNORMAL HIGH (ref 70–99)

## 2014-02-03 LAB — PRO B NATRIURETIC PEPTIDE: PRO B NATRI PEPTIDE: 129.5 pg/mL — AB (ref 0–125)

## 2014-02-03 LAB — PROCALCITONIN: Procalcitonin: <0.1 ng/mL

## 2014-02-03 NOTE — Progress Notes (Signed)
Daily Rounding Note  02/03/2014, 8:49 AM  LOS: 3 days   SUBJECTIVE:       Niece is adamant that pt be full code.  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  No vomiting  OBJECTIVE:         Vital signs in last 24 hours:    Temp:  [97.6 F (36.4 C)-97.9 F (36.6 C)] 97.6 F (36.4 C) (03/24 6195) Pulse Rate:  [90-104] 99 (03/24 0611) Resp:  [18] 18 (03/24 0611) BP: (104-110)/(51-63) 106/51 mmHg (03/24 0611) SpO2:  [93 %-100 %] 98 % (03/24 0611) Last BM Date: 01/31/14 General: cachectic, looks bad and very weak.  She is alert, uncomfortable   Heart: RR, slightly tachy Chest: obvious increased rate and effort, compared with yesterday. Abdomen: soft, diffusely tender, > on left  Extremities: no CCE Neuro/Psych:  Appropriate, alert, relaxed, follows commands  Lab Results:  Recent Labs  01/31/14 1540 02/01/14 1130 02/02/14 0635  WBC 12.1* 9.3 6.5  HGB 11.5* 10.3* 10.7*  HCT 38.4 33.3* 35.0*  PLT 261 249 248   BMET  Recent Labs  02/01/14 1130 02/02/14 0635 02/03/14 0521  NA 142 143 144  K 4.1 4.3 4.4  CL 98 101 99  CO2 34* 34* 35*  GLUCOSE 85 85 88  BUN 16 20 22   CREATININE 0.52 0.50 0.51  CALCIUM 8.9 8.8 9.1    Studies/Results: Ct Abdomen Pelvis W Contrast 02/02/2014   FINDINGS: Lung bases show volume loss in the dependent portions of both lower lobes. Emphysema. Tiny left pleural effusion. Heart size within normal limits. No pericardial effusion.  Scattered low attenuation lesions in the liver measure up to 7 mm, as before. Intrahepatic and extrahepatic biliary duct dilatation appears unchanged. Gallbladder and adrenal glands are unremarkable. There is a dilated upper pole calyx with overlying scarring. The right renal collecting system does not appear  duplicated, however. Sub cm low-attenuation lesion in the lower pole right kidney is unchanged and likely a cyst. Left kidney, spleen and pancreas are unremarkable. Possible polypoid gastric wall thickening proximally (series 2, image 13). Finding persists on nephrographic phase imaging. Small bowel is unremarkable. Stool is seen in the majority of the colon. Appendix is unremarkable. Question rectal prolapse. Bladder is unremarkable.  Hysterectomy. Atherosclerotic calcification of the arterial vasculature without abdominal aortic aneurysm. No pathologically enlarged lymph nodes. No free fluid. Periumbilical hernia contains fat. There is soft tissue thickening overlying the lower sacrum (series 2, image 56). Old right superior and inferior pubic rami fractures. L3 compression fracture is new from 03/01/2013. Suspect mild compression of the L1 superior endplate as well.  IMPRESSION: 1. Question polypoid thickening involving the proximal gastric wall. 2. Stool throughout the colon is indicative of constipation. Suspect associated rectal prolapse. 3. Tiny left pleural effusion. 4. Soft tissue thickening overlying the lower sacrum is in keeping with the given history of a sacral decubitus ulcer. 5. L3 compression fracture is new from 03/01/2013. Suspect mild compression of the L1 superior endplate as well.   Electronically Signed   By: Leanna Battles M.D.   On: 02/02/2014 08:36   Scheduled Meds: . aztreonam  1 g Intravenous 3 times per day  . calcitonin (salmon)  1 spray Alternating Nares Daily  . citalopram  15 mg Oral Daily  . donepezil  10  mg Oral QHS  . famotidine  40 mg Oral Daily  . feeding supplement (ENSURE COMPLETE)  237 mL Oral BID BM  . feeding supplement (PRO-STAT SUGAR FREE 64)  30 mL Oral BID WC  . ferrous sulfate  325 mg Oral TID WC  . furosemide  20 mg Oral Q breakfast  . heparin  5,000 Units Subcutaneous 3 times per day  . Iloperidone  3 mg Oral QHS  . ipratropium-albuterol  3 mL  Nebulization TID  . megestrol  400 mg Oral Daily  . mometasone-formoterol  2 puff Inhalation BID  . perphenazine  6 mg Oral QHS  . predniSONE  50 mg Oral Q breakfast  . QUEtiapine  50 mg Oral QHS  . senna-docusate  1 tablet Oral BID  . sodium chloride  3 mL Intravenous Q12H  . vancomycin (VANCOCIN) 750 mg IVPB  750 mg Intravenous Q24H  . Vitamin D (Ergocalciferol)  50,000 Units Oral Q7 days   Continuous Infusions:  PRN Meds:.sodium chloride, albuterol, HYDROcodone-acetaminophen, morphine injection, RESOURCE THICKENUP CLEAR, sodium chloride, traMADol   ASSESMENT:   * Abdominal pain. CT with thickened gastric wall. Rule out cancer.  * HCAP, baseline oxygen and nocturnal bipap requiring COPD. HCAP admission for 5 days in 12/2013. On prednisone taper and Azactam/Levaquin. .  * Borderline macrocytic anemia. No anemia studies in Epic, on chronic Iron. FOBT negative at bedside 3/23. Minor BPR later that day. Hgb stable.  * Cachexia and weight loss. Improved in last several weeks. * Dementia.  * Stage 4 sacral decubitus ulcer. Wound vac in place.   * DM type 2. No meds for this on PTA list.     PLAN   *  Too high risk for any endoscopic procedure. GI has little to offer in care of the pt. Prn hydrocodone.     Jennye MoccasinSarah Gribbin  02/03/2014, 8:49 AM Pager: [phone]  I agree w/ Ms. Clarita LeberGribbin - not a candidate for sedated endoscopy at this point. Call us back prn if she improves - but if she does happen to have gastric cancer it would be terminal.  Iva Booparl E. Shelise Maron, MD, Antionette FairyFACG [facility] [phone] (pager) 02/03/2014 6:29 PM

## 2014-02-03 NOTE — Progress Notes (Signed)
FMTS Attending Daily Note: Valari Taylor MD [phone] pager office [phone] I  have seen and examined this patient, reviewed their chart. I have discussed this patient with the resident. I agree with the resident's findings, assessment and care plan. 

## 2014-02-03 NOTE — Progress Notes (Signed)
Family Medicine Teaching Service Daily Progress Note Intern Pager: [phone]  Patient name: Katelyn Rojas Medical record number: [id number] Date of birth: 22-Aug-1941 Age: 73 y.o. Gender: female  Primary Care Provider: Kirt Boys, MD Consultants: Palliative care, GI, pulm Code Status: Full  Pt Overview and Major Events to Date:  3/21: Admitted 3/22: Continued back and abd pain 3/23: CT Abd with ? Polypoid gastric wall thickening, GI consulted but pt unable to tolerate EGD 3/24: Palliative and pulmonology consults  Antibiotics Vanc 3/21>> Aztreonam 3/21>>  Cultures Blood X 2 3/21>> no growth to date (not final) Sputum - needs to be collected  Assessment and Plan: Katelyn Rojas is a 73 y.o. female presenting with HCAP, back, abdominal and chest pain. PMH is significant for Chronic Resp failure 2/2 COPD, malniutrition, Sacral decubitus ulcer, T2DM, and HTN.   # HCAP - in setting of COPD, acute on chronic respiratory failure  - Recent hospitalization in February  - Vanc and aztreonam per pharmacy (penicillin allergic) >> Continue due to increased WOB - Blood culture, sputum culture and Gram stain, Legionella and strep urine antigen - pending - Chronic steroid use, increased to prednisone 50 mg daily X 5-7 days (received 125 mg Solu-Medrol in ED)  - Duo neb every 4 hours, albuterol neb every 2 when necessary  - Pulmonology consulted today  # Abdominal pain - Possible Stomach Cancer - CT abd with ? Polypoid gastric wall thickening - GI: too frail for EGD, not candidate for lower endoscopy - Palliative consult pending  # Back pain: good relief with Hycet - likely compression fracture related, CT abd shows new L3 compression fx - tylenol, calcitonin - opioid analgesic prn q4hr (Hycet: hydrocodone-acetaminophen 7.5/325 solution)  # Metabolic alkalosis  - Likely compensation from chronic respiratory acidosis caused by chronic CO2 retention  - Monitor BMP  # Sacral  decubitus ulcer  - Likely that healing is inhibited by chronic steroid use, continue wound VAC  - Consult wound care nurse   # Constipation: typically takes home laxative - continue Senna  # Chest pain - Atypical chest pain. Resolved. - Trop neg X 3 - EKG with non specific T wave inversions in V1 and V2, V2 not inverted on initial EKG  # Dementia with history of depression  - Appears to be baseline mental status currently  - Continue Aricept, lloperidone, perphenazine, and Seroquel  - Some confusion on last admission 2/2 hypercarbia   # Malnutrition  - Continue Ensure, megase, pro-stat liquid  - Tolerating regular diet per her daughter, continue nectar thick liquids   # T2DM  - Diet controlled, monitor with daily labs   # HTN  - 100s/50s currently  - No blood pressure meds, monitor   FEN/GI: IV hep locked, Diet: dysphasia 2, nectar thick liquids  Prophylaxis: sub q heparin   Disposition: inpatient stay for continued worsened work of breathing  Subjective:  Pt received hycet once yesterday and stated her back pain went from 5/10 to 0/10. She is currently in moderate pain and had just received hycet. Pt is aware that several doctors have come to see her including the GI docs, but isn't sure of the overall hospitalization events. I reviewed with the patient the events so far including pneumonia being treated with antibiotics, increased prednisone for her COPD, a CT showing a stomach mass and pending palliative care discussions. Greater than 25 minutes spent with the patient discussing this hospitalization events.    Objective: Temp:  [97.6 F (36.4 C)-97.9 F (36.6 C)]  97.6 F (36.4 C) (03/24 16100611) Pulse Rate:  [90-102] 99 (03/24 0611) Resp:  [18] 18 (03/24 0611) BP: (104-110)/(51-63) 106/51 mmHg (03/24 0611) SpO2:  [93 %-100 %] 99 % (03/24 0953) Physical Exam: Gen: elderly and frail woman lying in bed in NAD HEENT: has nasal cannula CV: RRR, normal S1 and S2, no  murmurs Resp: good air movement, mild use of accessory muscles during breathing, retraction supraclavicularly  Abd: soft, exquisitely tender throughout, voluntary guarding Ext: No edema, warm to touch Neuro: Alert, oriented to person and place  Laboratory:  Recent Labs Lab 01/31/14 1540 02/01/14 1130 02/02/14 0635  WBC 12.1* 9.3 6.5  HGB 11.5* 10.3* 10.7*  HCT 38.4 33.3* 35.0*  PLT 261 249 248    Recent Labs Lab 02/01/14 1130 02/02/14 0635 02/03/14 0521  NA 142 143 144  K 4.1 4.3 4.4  CL 98 101 99  CO2 34* 34* 35*  BUN 16 20 22   CREATININE 0.52 0.50 0.51  CALCIUM 8.9 8.8 9.1  GLUCOSE 85 85 88   Imaging/Diagnostic Tests: None in past 24 hours  [name], Med Student 02/03/2014, 12:59 PM [facility] FPTS Intern pager: [phone], text pages welcome  I have seen and examined the patient with student Dr. Lodema HongSimpson and agree with his documentation above. My annotations are in blue nad my findings are summarized below.   S: Pain medications are helping her back and abd pain. States her breathing is still labored.   O: Filed Vitals:   02/03/14 0611  BP: 106/51  Pulse: 99  Temp: 97.6 F (36.4 C)  Resp: 18   Gen: NAD, alert, cooperative with exam, cachectic appearing.  HEENT: NCAT, pursed lip breathing, somewhat dry mucous membranes  CV: RRR, good S1/S2, no murmur  Resp: CTABL, no wheezes, mild to moderate increased WOB  Abd: soft but tenderness to very mild palpation, hernia midline is soft and not erythematous, unable to palpate deeply enough to reduce it  Ext: No edema, warm  Neuro: Alert and oriented to person and place, struggles with time.   A/P:  73 y/o female with COPD on chronic steroids here with HCAP/COPD exacerbation also with abd and back pain. Her respiratory status does not appear to be improving on steroids and antibiotics. With her increased WOB I think we should consider temporary BiPAP, we are consulting pulmonology for  recommendations. We appreciate their assistance with this complicated patient.   She also has some concern for stomach cancer and is not a good candidate for GI workup with endoscopy. We will await palliative's recommendations and decide how aggressive we will be. She is in [place] for new vertebral compression fractures exacerbated by chronic steroid use.   Murtis SinkSam Delecia Vastine, MD [facility] Resident, PGY-2 02/03/2014, 1:15 PM

## 2014-02-03 NOTE — Consult Note (Signed)
Patient ZO:XWRUEAVW:Katelyn Rojas      DOB: 01/17/1941      UJW:[id number]     [facility]    Consult Requested by: Dr Randolm IdolFletke     PCP: Kirt BoysARTER,[name], MD Reason for Consultation:  Clarification of GOC and options     Phone [phone]  Assessment of patients Current state: 73 y/o female with PMH Chronic Respiratory Failure, COPD, malnutrition, Sacral decubitus ulcer, T2DM, and HTN presented in acute respiratory failure due to suspected HCAP.    Cared for in home of her niece.  Currently attends [facility] daily [facility].  Her neice and main caregiver tells me at baseline the patient walks, feeds self and the adult center tells me she participates in activities there.  Family is faced with advanced directive decisions and anticipatory care needs.  Her family is hopeful for return to baseline.    This NP Lorinda CreedMary Aylin Rhoads reviewed medical records, received report from team, assessed the patient and then meet at the patient's bedside along with her niece Julio AlmKathrine Clark [phone]  to discuss diagnosis prognosis, GOC, EOL wishes disposition and options.  A detailed discussion was had today regarding advanced directives.  Concepts specific to code status, artifical feeding and hydration, continued IV antibiotics and rehospitalization was had.  The difference between a aggressive medical intervention path  and a palliative comfort care path for this patient at this time was had.  Values and goals of care important to patient and family were attempted to be elicited.  Concept of Hospice and Palliative Care were discussed  Natural trajectory and expectations at EOL were discussed.  Questions and concerns addressed.  Hard Choices booklet left for review. Family encouraged to call with questions or concerns.  PMT will continue to support holistically.    Goals of Care: 1.  Code Status:  Full code   2. Scope of Treatment:  Family is  open to all available and offered medical interventions to prolong life.  Again hopeful for return to baseline.    4. Disposition: Dependant on outcomes. Her [facility] discussed and if she is eligible      3. Symptom Management:   1.  Weakness-continue to treat the treatable.  PT/OT/Speech therapy as tolerated   4. Psychosocial:  Emotional support to niece at bedside.    Patient Documents Completed or Given: Document Given Completed  Advanced Directives Pkt    MOST yes   DNR    Gone from My Sight    Hard Choices      Brief HPI:73 y/o female with PMH Chronic Respiratory Failure, COPD, malnutrition, Sacral decubitus ulcer, T2DM, and HTN presented in acute respiratory failure due to suspected HCAP. Overall failure to thrive   ROS: unable to illicit (patietn is clearly dyspnic    PMH:  Past Medical History  Diagnosis Date  . Hypertension   . 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 Arthritis   . Anxiety   . Cachexia 12/2013  . DM type 2 (diabetes mellitus, type 2)   . 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 Anemia 12/2013  . Dementia   . Compression fracture of lumbar vertebra, non-traumatic 12/2013    L3 and L1.      PSH: Past Surgical History  Procedure Laterality Date  . Vaginal hysterectomy      ? Abdominal vs. Vaginal   I  have reviewed the FH and SH and  If appropriate update it with new information. Allergies  Allergen Reactions  . Namenda [Memantine Hcl] Other (See Comments)    "hallucinations"   . Penicillins Other (See Comments)    unknown   Scheduled Meds: . aztreonam  1 g Intravenous 3 times per day  . calcitonin (salmon)  1 spray Alternating Nares Daily  . citalopram  15 mg Oral Daily  . donepezil  10 mg Oral QHS  . famotidine  40 mg Oral Daily  . feeding supplement (ENSURE COMPLETE)  237 mL Oral BID BM  . feeding supplement (PRO-STAT SUGAR FREE 64)  30 mL Oral BID  WC  . ferrous sulfate  325 mg Oral TID WC  . furosemide  20 mg Oral Q breakfast  . heparin  5,000 Units Subcutaneous 3 times per day  . Iloperidone  3 mg Oral QHS  . ipratropium-albuterol  3 mL Nebulization TID  . megestrol  400 mg Oral Daily  . mometasone-formoterol  2 puff Inhalation BID  . perphenazine  6 mg Oral QHS  . predniSONE  50 mg Oral Q breakfast  . QUEtiapine  50 mg Oral QHS  . senna-docusate  1 tablet Oral BID  . sodium chloride  3 mL Intravenous Q12H  . vancomycin (VANCOCIN) 750 mg IVPB  750 mg Intravenous Q24H  . Vitamin D (Ergocalciferol)  50,000 Units Oral Q7 days   Continuous Infusions:  PRN Meds:.sodium chloride, albuterol, HYDROcodone-acetaminophen, morphine injection, RESOURCE THICKENUP CLEAR, sodium chloride, traMADol    BP 106/51  Pulse 99  Temp(Src) 97.6 F (36.4 C) (Axillary)  Resp 18  Ht 5' 1.02" (1.55 m)  Wt 41.187 kg (90 lb 12.8 oz)  BMI 17.14 kg/m2  SpO2 99%   PPS:   30% at best   Intake/Output Summary (Last 24 hours) at 02/03/14 1000 Last data filed at 02/03/14 0659  Gross per 24 hour  Intake    350 ml  Output      0 ml  Net    350 ml    Physical Exam:  General:  Frail ,cachetic HEENT:  Mm, no exudate Chest:   diminished throughout CVS: tachycardic Abdomen: soft NT +BS Ext: without edema, BLE muscle atrophy  Neuro: alert and oriented X2  Labs: CBC    Component Value Date/Time   WBC 6.5 02/02/2014 0635   RBC 3.53* 02/02/2014 0635   HGB 10.7* 02/02/2014 0635   HCT 35.0* 02/02/2014 0635   PLT 248 02/02/2014 0635   MCV 99.2 02/02/2014 0635   MCH 30.3 02/02/2014 0635   MCHC 30.6 02/02/2014 0635   RDW 14.5 02/02/2014 0635   LYMPHSABS 0.9 12/31/2013 0338   MONOABS 1.1* 12/31/2013 0338   EOSABS 0.0 12/31/2013 0338   BASOSABS 0.0 12/31/2013 0338    BMET    Component Value Date/Time   NA 144 02/03/2014 0521   K 4.4 02/03/2014 0521   CL 99 02/03/2014 0521   CO2 35* 02/03/2014 0521   GLUCOSE 88 02/03/2014 0521   BUN 22 02/03/2014 0521    CREATININE 0.51 02/03/2014 0521   CALCIUM 9.1 02/03/2014 0521   GFRNONAA >90 02/03/2014 0521   GFRAA >90 02/03/2014 0521    CMP     Component Value Date/Time   NA 144 02/03/2014 0521   K 4.4 02/03/2014 0521   CL 99 02/03/2014 0521   CO2 35* 02/03/2014 0521   GLUCOSE 88 02/03/2014 0521   BUN 22 02/03/2014 0521   CREATININE 0.51  02/03/2014 0521   CALCIUM 9.1 02/03/2014 0521   GFRNONAA >90 02/03/2014 0521   GFRAA >90 02/03/2014 0521     Time In Time Out Total Time Spent with Patient Total Overall Time  0900 1015 70 min 75 min    Greater than 50%  of this time was spent counseling and coordinating care related to the above assessment and plan.  Lorinda Creed NP  Palliative Medicine Team Team Phone # [phone] Pager [phone]  Discussed with Family Medicine

## 2014-02-03 NOTE — Consult Note (Signed)
PULMONARY / CRITICAL CARE MEDICINE   Name: Katelyn Rojas MRN: [id number] DOB: Apr 18, 1941    ADMISSION DATE:  01/31/2014 CONSULTATION DATE:  02/03/2014   REFERRING MD :  Danise Edge PRIMARY SERVICE: IMTS   CHIEF COMPLAINT:  Dyspnea  BRIEF PATIENT DESCRIPTION: 73 year old female with chronic respiratory failure secondary to COPD, admitted to [facility] 3/21 for HCAP. Slow to improve. PCCM asked to see.   SIGNIFICANT EVENTS / STUDIES: 3/21 admitted 3/23 CT abd/pelvis > thickening involving the proximal gastric wall, Multiple compression fractures.   LINES / TUBES: PIV  CULTURES: Blood 2/21 >>> Sputum 2/21 >>>  ANTIBIOTICS: Aztreonam 2/21 >>> Vancomycin 2/21 >>>  HISTORY OF PRESENT ILLNESS:  73 y.o. female presenting with HCAP. She was admitted in February 2015 with similar complaints. She is on 2 L nasal cannula chronically at home. She's been on prednisone 10 mg chronically for about one year. She also complains that for 2-3 days prior to admission she's had increased abdominal pain and back pain. She has known vertebral compression fractures. She also complained of mid chest pain throughout the day today. 2/20 over night she had unusual oxygen desaturations throughout the night which have required more oxygen. She stated that her saturations went as low as 75% on her BiPAP. She also complained of some chest pain that was non radiating. She was admitted for HCAP and started on aztreonam and zosyn.  PAST MEDICAL HISTORY :  Past Medical History  Diagnosis Date  . Hypertension   . 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 Arthritis   . Anxiety   . Cachexia 12/2013  . DM type 2 (diabetes mellitus, type 2)   . 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 Anemia 12/2013  . Dementia   . Compression fracture of lumbar vertebra, non-traumatic 12/2013    L3 and L1.    Past Surgical History  Procedure Laterality Date  . Vaginal hysterectomy      ? Abdominal vs. Vaginal    Prior to Admission medications   Medication Sig Start Date End Date Taking? Authorizing Provider  albuterol (PROVENTIL HFA;VENTOLIN HFA) 108 (90 BASE) MCG/ACT inhaler Inhale 1-2 puffs into the lungs every 6 (six) hours as needed for wheezing or shortness of breath.   Yes Historical Provider, MD  albuterol (PROVENTIL) (2.5 MG/3ML) 0.083% nebulizer solution Take 2.5 mg by nebulization every 4 (four) hours as needed for wheezing or shortness of breath.   Yes Historical Provider, MD  Amino Acids-Protein Hydrolys (FEEDING SUPPLEMENT, PRO-STAT SUGAR FREE 64,) LIQD Take 30 mLs by mouth 2 (two) times daily with a meal. 12/31/13  Yes Saralyn Pilar, DO  citalopram (CELEXA) 10 MG/5ML suspension Take 15 mg by mouth daily.   Yes Historical Provider, MD  Dextromethorphan-Guaifenesin (GUAIFENESIN DM) 400-20 MG TABS Take 1 tablet by mouth every 4 (four) hours as needed (congestion).   Yes Historical Provider, MD  donepezil (ARICEPT) 10 MG tablet Take 10 mg by mouth at bedtime.   Yes Historical Provider, MD  ENSURE PLUS (ENSURE PLUS) LIQD Take 237 mLs by mouth 2 (two) times daily between meals.   Yes Historical Provider, MD  famotidine (PEPCID) 20 MG tablet Take 40 mg by mouth daily.    Yes Historical Provider, MD  ferrous sulfate 325 (65 FE) MG tablet Take 325 mg by mouth 3 (three) times daily with meals.   Yes Historical Provider, MD  Fluticasone-Salmeterol (ADVAIR) 250-50 MCG/DOSE AEPB Inhale 1 puff into the lungs 2 (two)  times daily.   Yes Historical Provider, MD  food thickener (SIMPLYTHICK) POWD Take 1 packet by mouth as needed (nectar).   Yes Historical Provider, MD  furosemide (LASIX) 20 MG tablet Take 20 mg by mouth daily.   Yes Historical Provider, MD  Iloperidone (FANAPT) 1 MG TABS Take 3 mg by mouth at bedtime.   Yes Historical Provider, MD  megestrol (MEGACE) 40 MG/ML suspension Take 400 mg by mouth daily.   Yes Historical Provider, MD  naproxen sodium (ANAPROX) 220 MG tablet Take 220 mg by  mouth 2 (two) times daily as needed (arthritis).   Yes Historical Provider, MD  perphenazine (TRILAFON) 2 MG tablet Take 6 mg by mouth at bedtime.   Yes Historical Provider, MD  predniSONE (DELTASONE) 10 MG tablet Take 10 mg by mouth daily with breakfast.   Yes Historical Provider, MD  QUEtiapine (SEROQUEL) 25 MG tablet Take 50 mg by mouth at bedtime.    Yes Historical Provider, MD  traMADol (ULTRAM) 50 MG tablet Take 50 mg by mouth every 6 (six) hours as needed for moderate pain.   Yes Historical Provider, MD  Vitamin D, Ergocalciferol, (DRISDOL) 50000 UNITS CAPS capsule Take 50,000 Units by mouth every 7 (seven) days. Monday   Yes Historical Provider, MD   Allergies  Allergen Reactions  . Namenda [Memantine Hcl] Other (See Comments)    "hallucinations"   . Penicillins Other (See Comments)    unknown    FAMILY HISTORY:  History reviewed. No pertinent family history. SOCIAL HISTORY:  reports that she has quit smoking. She does not have any smokeless tobacco history on file. Her alcohol and drug histories are not on file.  REVIEW OF SYSTEMS: Bolds are positive  Constitutional: weight loss, gain, night sweats, Fevers, chills, fatigue .  HEENT: headaches, Sore throat, sneezing, nasal congestion, post nasal drip, Difficulty swallowing, Tooth/dental problems, visual complaints visual changes, ear ache CV:  chest pain, radiates: ,Orthopnea, PND, swelling in lower extremities, dizziness, palpitations, syncope.  GI  heartburn, indigestion, abdominal pain, nausea, vomiting, diarrhea, change in bowel habits, loss of appetite, bloody stools.  Resp: cough, productive: , hemoptysis, dyspnea, chest pain, pleuritic.  Skin: rash or itching or icterus GU: dysuria, change in color of urine, urgency or frequency. flank pain, hematuria  MS: r leg pain of unspecified duration joint pain or swelling. decreased range of motion  Psych: change in mood or affect. depression or anxiety.  Neuro: difficulty with  speech, weakness, numbness, ataxia    SUBJECTIVE:   VITAL SIGNS: Temp:  [97.6 F (36.4 C)-97.9 F (36.6 C)] 97.6 F (36.4 C) (03/24 0611) Pulse Rate:  [96-102] 99 (03/24 0611) Resp:  [18] 18 (03/24 0611) BP: (104-110)/(51-63) 106/51 mmHg (03/24 0611) SpO2:  [93 %-100 %] 99 % (03/24 0953) HEMODYNAMICS:   VENTILATOR SETTINGS:   INTAKE / OUTPUT: Intake/Output     03/23 0701 - 03/24 0700 03/24 0701 - 03/25 0700   P.O.  240   Other 100    IV Piggyback 250    Total Intake(mL/kg) 350 (8.5) 240 (5.8)   Net +350 +240        Urine Occurrence 1 x      PHYSICAL EXAMINATION: General:  Cachectic female in mild respiratory distress.  Neuro:  Alert, oriented HEENT:  [place]/AT, PERRL Cardiovascular:  RRR Lungs: respirations mildly labored Diminished bases Abdomen:  Soft, generalized tenderness Musculoskeletal:  Weak, appears atrophied  Skin:  Sacral ulcer.   LABS:  CBC  Recent Labs Lab 01/31/14 1540 02/01/14  1130 02/02/14 0635  WBC 12.1* 9.3 6.5  HGB 11.5* 10.3* 10.7*  HCT 38.4 33.3* 35.0*  PLT 261 249 248   Coag's No results found for this basename: APTT, INR,  in the last 168 hours BMET  Recent Labs Lab 02/01/14 1130 02/02/14 0635 02/03/14 0521  NA 142 143 144  K 4.1 4.3 4.4  CL 98 101 99  CO2 34* 34* 35*  BUN 16 20 22   CREATININE 0.52 0.50 0.51  GLUCOSE 85 85 88   Electrolytes  Recent Labs Lab 02/01/14 1130 02/02/14 0635 02/03/14 0521  CALCIUM 8.9 8.8 9.1   Sepsis Markers No results found for this basename: LATICACIDVEN, PROCALCITON, O2SATVEN,  in the last 168 hours ABG No results found for this basename: PHART, PCO2ART, PO2ART,  in the last 168 hours Liver Enzymes No results found for this basename: AST, ALT, ALKPHOS, BILITOT, ALBUMIN,  in the last 168 hours Cardiac Enzymes  Recent Labs Lab 01/31/14 1540 01/31/14 1929 02/01/14 0138 02/01/14 0745  TROPONINI  --  <0.30 <0.30 <0.30  PROBNP 155.9*  --   --   --    Glucose  Recent Labs Lab  02/01/14 0752 02/01/14 2120 02/02/14 0750 02/02/14 1129 02/02/14 2040 02/03/14 1218  GLUCAP 84 85 90 202* 167* 132*    Imaging Ct Abdomen Pelvis W Contrast  02/02/2014   CLINICAL DATA:  Diffuse abdominal pain with nausea and vomiting. Sacral decubitus ulcer.  EXAM: CT ABDOMEN AND PELVIS WITH CONTRAST  TECHNIQUE: Multidetector CT imaging of the abdomen and pelvis was performed using the standard protocol following bolus administration of intravenous contrast.  CONTRAST:  36mL OMNIPAQUE IOHEXOL 300 MG/ML  SOLN  COMPARISON:  CT-ABDOMEN AND PELVIS W/ CONTRAST dated 03/01/2013; DG CHEST 2V dated 12/14/2013; XR-ABDOMEN-KUB/FLAT PLATE dated 0/56/9794  FINDINGS: Lung bases show volume loss in the dependent portions of both lower lobes. Emphysema. Tiny left pleural effusion. Heart size within normal limits. No pericardial effusion.  Scattered low attenuation lesions in the liver measure up to 7 mm, as before. Intrahepatic and extrahepatic biliary duct dilatation appears unchanged. Gallbladder and adrenal glands are unremarkable. There is a dilated upper pole calyx with overlying scarring. The right renal collecting system does not appear duplicated, however. Sub cm low-attenuation lesion in the lower pole right kidney is unchanged and likely a cyst. Left kidney, spleen and pancreas are unremarkable. Possible polypoid gastric wall thickening proximally (series 2, image 13). Finding persists on nephrographic phase imaging. Small bowel is unremarkable. Stool is seen in the majority of the colon. Appendix is unremarkable. Question rectal prolapse. Bladder is unremarkable.  Hysterectomy. Atherosclerotic calcification of the arterial vasculature without abdominal aortic aneurysm. No pathologically enlarged lymph nodes. No free fluid. Periumbilical hernia contains fat. There is soft tissue thickening overlying the lower sacrum (series 2, image 56). Old right superior and inferior pubic rami fractures. L3 compression  fracture is new from 03/01/2013. Suspect mild compression of the L1 superior endplate as well.  IMPRESSION: 1. Question polypoid thickening involving the proximal gastric wall. 2. Stool throughout the colon is indicative of constipation. Suspect associated rectal prolapse. 3. Tiny left pleural effusion. 4. Soft tissue thickening overlying the lower sacrum is in keeping with the given history of a sacral decubitus ulcer. 5. L3 compression fracture is new from 03/01/2013. Suspect mild compression of the L1 superior endplate as well.   Electronically Signed   By: Leanna Battles M.D.   On: 02/02/2014 08:36     CXR:   ASSESSMENT / PLAN:  Acute on chronic respiratory failure secondary to HCAP in setting of COPD ddx includes HCAP, Pulmonary Edema, ?PE COPD without evidence of exacerbation Immunocompromised status chronic prednisone use.   - Wean supplemental O2 to keep SpO2 > 92% - Continue antibiotics per primary team, consider adding levaquin r/t recent hospitalization and ABX - Continue home bronchodilators, no evidence of acute bronchospasm - Check TTE - Doppler BLE - Trend PCT - Check CXR now  Joneen Roach, ACNP [facility] Pager [phone] or [phone]  02/03/2014, 1:38 PM  Continue supplemental O2 as needed, already have home O2 set up, will need to check 2d echo and bilateral lower ext dopplers, unlikely to be a PE.  PCT ordered and pending.  CXR now for evaluation of RLL.  Continue steroids, ok with PO.  Abx as above.  Patient seen and examined, agree with above note.  I dictated the care and orders written for this patient under my direction.  [name], MD [phone]

## 2014-02-03 NOTE — Progress Notes (Signed)
RT placed patient on cpap per home settings via home mask with 3lpm 02 bleed in. Patient is tolerating cpap well at this time. RT will continue to monitor.

## 2014-02-04 DIAGNOSIS — I517 Cardiomegaly: Secondary | ICD-10-CM

## 2014-02-04 DIAGNOSIS — R0602 Shortness of breath: Secondary | ICD-10-CM

## 2014-02-04 LAB — GLUCOSE, CAPILLARY
GLUCOSE-CAPILLARY: 110 mg/dL — AB (ref 70–99)
GLUCOSE-CAPILLARY: 143 mg/dL — AB (ref 70–99)
Glucose-Capillary: 134 mg/dL — ABNORMAL HIGH (ref 70–99)
Glucose-Capillary: 159 mg/dL — ABNORMAL HIGH (ref 70–99)

## 2014-02-04 MED ORDER — PREDNISONE 20 MG PO TABS
40.0000 mg | ORAL_TABLET | Freq: Every day | ORAL | Status: DC
  Administered 2014-02-05: 40 mg via ORAL
  Filled 2014-02-04 (×2): qty 2

## 2014-02-04 MED ORDER — LEVOFLOXACIN 750 MG PO TABS
750.0000 mg | ORAL_TABLET | ORAL | Status: DC
  Administered 2014-02-04: 750 mg via ORAL
  Filled 2014-02-04: qty 1

## 2014-02-04 MED ORDER — HYDROCODONE-ACETAMINOPHEN 7.5-325 MG/15ML PO SOLN
10.0000 mL | ORAL | Status: DC
Start: 2014-02-04 — End: 2014-02-05
  Administered 2014-02-04 – 2014-02-05 (×5): 10 mL via ORAL
  Filled 2014-02-04 (×6): qty 15

## 2014-02-04 MED ORDER — MIRTAZAPINE 15 MG PO TBDP
7.5000 mg | ORAL_TABLET | Freq: Every day | ORAL | Status: DC
  Administered 2014-02-04: 7.5 mg via ORAL
  Filled 2014-02-04 (×2): qty 0.5

## 2014-02-04 MED ORDER — DOXYCYCLINE HYCLATE 100 MG PO TABS
100.0000 mg | ORAL_TABLET | Freq: Two times a day (BID) | ORAL | Status: DC
  Filled 2014-02-04 (×2): qty 1

## 2014-02-04 NOTE — Progress Notes (Signed)
*  PRELIMINARY RESULTS* Echocardiogram 2D Echocardiogram has been performed.  Jeryl Columbia 02/04/2014, 12:30 PM

## 2014-02-04 NOTE — Progress Notes (Signed)
VASCULAR LAB PRELIMINARY  PRELIMINARY  PRELIMINARY  PRELIMINARY  Bilateral lower extremity venous duplex completed.    Preliminary report:  Bilateral:  No evidence of DVT, superficial thrombosis, or Baker's Cyst.   Romir Klimowicz, RVS 02/04/2014, 2:54 PM

## 2014-02-04 NOTE — Care Management Note (Signed)
    Page 1 of 2   02/05/2014     2:36:41 PM   CARE MANAGEMENT NOTE 02/05/2014  Patient:  Katelyn Rojas, Katelyn Rojas   Account Number:  [id number]  Date Initiated:  02/04/2014  Documentation initiated by:  GRAVES-BIGELOW,Oz Gammel  Subjective/Objective Assessment:   Pt admitted with chronic respiratory failure secondary to COPD, admitted to [facility] 3/21 for HCAP. Pt continues on IV vancomycin. Has wound vac for Sacral decubitus ulcer. Pt is active with [facility] for [facility] RN, PT services.     Action/Plan:   CM to continue to monitor for disposition needs. Pulmonary and Palliative Care Consulted.   Anticipated DC Date:  02/06/2014   Anticipated DC Plan:  HOME W HOME HEALTH SERVICES      DC Planning Services  CM consult      [facility] Choice  HOME HEALTH   Choice offered to / List presented to:  C-1 Patient        HH arranged  HH-1 RN  HH-10 DISEASE MANAGEMENT  HH-2 PT      HH agency  [facility]   Status of service:  Completed, signed off Medicare Important Message given?   (If response is "NO", the following Medicare IM given date fields will be blank) Date Medicare IM given:   Date Additional Medicare IM given:    Discharge Disposition:  HOME W HOME HEALTH SERVICES  Per UR Regulation:  Reviewed for med. necessity/level of care/duration of stay  If discussed at Long Length of Stay Meetings, dates discussed:   02/05/2014    Comments:  02-05-14 [address], [place] [phone] CM will fax information to [facility]. CM did call Clide Cliff and Lillia Abed with [employer]. Awaiting return phone call. Will continue to monitor.    02-04-14 1521 Tomi Bamberger, RN, BSN [phone] CM will continue to monitor once Palliative Care Consults. Wound Vac via [employer].

## 2014-02-04 NOTE — Progress Notes (Addendum)
PULMONARY / CRITICAL CARE MEDICINE   Name: Katelyn BruinDelsenia Rojas MRN: [id number] DOB: 07/18/1941    ADMISSION DATE:  01/31/2014 CONSULTATION DATE:  02/03/2014   REFERRING MD :  Danise EdgeIMTS PRIMARY SERVICE: IMTS   CHIEF COMPLAINT:  Dyspnea  BRIEF PATIENT DESCRIPTION: 73 yo female with chronic respiratory failure secondary to COPD, admitted 3/21 for HCAP and AECOPD.  SIGNIFICANT EVENTS / STUDIES: 3/23 CT abd/pelvis >>> Thickening involving the proximal gastric wall, multiple compression fractures  LINES / TUBES:  CULTURES: 3/21 Blood  >>>  ANTIBIOTICS: Aztreonam 2/21 >>> Vancomycin 2/21 >>>  INTERVAL HISTORY: No acute overnight events.   VITAL SIGNS: Temp:  [97.7 F (36.5 C)-99 F (37.2 C)] 98 F (36.7 C) (03/25 0610) Pulse Rate:  [88-104] 88 (03/25 0610) Resp:  [18-20] 20 (03/25 0610) BP: (117-119)/(55-66) 119/58 mmHg (03/25 0610) SpO2:  [94 %-100 %] 100 % (03/25 0806) Weight:  [40.998 kg (90 lb 6.2 oz)] 40.998 kg (90 lb 6.2 oz) (03/25 0610) HEMODYNAMICS:   VENTILATOR SETTINGS:   INTAKE / OUTPUT: Intake/Output     03/24 0701 - 03/25 0700 03/25 0701 - 03/26 0700   P.O. 480    Other     IV Piggyback     Total Intake(mL/kg) 480 (11.7)    Net +480          Urine Occurrence 3 x    Stool Occurrence 1 x      PHYSICAL EXAMINATION: General:  Comfortable, in no distress Neuro:  Alert, oriented HEENT:  PERRL Cardiovascular:  Regular, no murmurs Lungs: Bilateral diminished air entry Abdomen:  Soft, bowel sounds present Musculoskeletal:  Weak Skin:  Sacral decub    LABS:  CBC  Recent Labs Lab 01/31/14 1540 02/01/14 1130 02/02/14 0635  WBC 12.1* 9.3 6.5  HGB 11.5* 10.3* 10.7*  HCT 38.4 33.3* 35.0*  PLT 261 249 248   Coag's No results found for this basename: APTT, INR,  in the last 168 hours BMET  Recent Labs Lab 02/01/14 1130 02/02/14 0635 02/03/14 0521  NA 142 143 144  K 4.1 4.3 4.4  CL 98 101 99  CO2 34* 34* 35*  BUN 16 20 22   CREATININE 0.52 0.50  0.51  GLUCOSE 85 85 88   Electrolytes  Recent Labs Lab 02/01/14 1130 02/02/14 0635 02/03/14 0521  CALCIUM 8.9 8.8 9.1   Sepsis Markers  Recent Labs Lab 02/03/14 1535  PROCALCITON <0.10   ABG No results found for this basename: PHART, PCO2ART, PO2ART,  in the last 168 hours Liver Enzymes No results found for this basename: AST, ALT, ALKPHOS, BILITOT, ALBUMIN,  in the last 168 hours Cardiac Enzymes  Recent Labs Lab 01/31/14 1540 01/31/14 1929 02/01/14 0138 02/01/14 0745 02/03/14 1535  TROPONINI  --  <0.30 <0.30 <0.30  --   PROBNP 155.9*  --   --   --  129.5*   Glucose  Recent Labs Lab 02/02/14 1129 02/02/14 2040 02/03/14 1218 02/03/14 1716 02/03/14 2153 02/04/14 0809  GLUCAP 202* 167* 132* 163* 174* 110*    IMAGING:   Dg Chest Port 1 View  02/03/2014   CLINICAL DATA:  Dyspnea  EXAM: PORTABLE CHEST - 1 VIEW  COMPARISON:  January 31, 2014  FINDINGS: The heart size and mediastinal contours are stable. The previously noted patchy opacities of both lung bases are much less prominent. There is no pulmonary edema or pleural effusion. The visualized skeletal structures are stable.  IMPRESSION: Previously noted patchy opacities of both lung bases are almost completely resolved.  No pulmonary edema.   Electronically Signed   By: Sherian Rein M.D.   On: 02/03/2014 16:30   ASSESSMENT / PLAN:  Acute on chronic respiratory failure  Supplemental oxygen  Nocturnal BiPAP as preadmisson  Pulmonary hygiene  COPD with possible exacerbation  Duoneb, Albuterol, Dulera   Prednisone decreased to 40, taper over 2 weeks  HCAP vs atypical pneumonia, improving radiographically PCN allergy Afebrile, WBC wnl, PCT reassuring  D/c Aztreonam / Vancomycin  Start Levaquin, for total of 10 days, it will also cover atypicals   Suspected PE, low clinical probability  Chest CTA deferred  Awaiting TTE and venous Dopplers  If Megace is used for failure to thrive would d/c as no strong  evidence for effectiveness and associated with increased  risk for VTE.  Would use Remeron instead.  Suspected cor pulmonale  Immunocompromised status secondary to chronic prednisone use  Failure to thrive End stage pulmonary disease Noted to be full code  Needs goals of care discussion  Palliative Medicine consultation requested  I have personally obtained history, examined patient, evaluated and interpreted laboratory and imaging results, reviewed medical records, formulated assessment / plan and placed orders.  Lonia Farber, MD Pulmonary and Critical Care Medicine [facility] Pager: [phone]  02/04/2014, 11:18 AM

## 2014-02-04 NOTE — Progress Notes (Signed)
UR Completed Omer Puccinelli Graves-Bigelow, RN,BSN [phone]  

## 2014-02-04 NOTE — Progress Notes (Signed)
Family Medicine Teaching Service Daily Progress Note Intern Pager: [phone]  Patient name: Katelyn Rojas Medical record number: [id number] Date of birth: [birth year] Age: 73 y.o. Gender: female  Primary Care Provider: Kirt Boys, MD Consultants: Palliative care, GI, pulm Code Status: Full  Pt Overview and Major Events to Date:  3/21: Admitted 3/22: Continued back and abd pain 3/23: CT Abd with ? Polypoid gastric wall thickening, GI consulted but pt unable to tolerate EGD 3/24: pulmonology consults 3/25: de-escalation of IV antibiotics to levoquin, begin prednisone taper, echo and lower ext dopplers  Antibiotics Vanc 3/21>>3/25 Aztreonam 3/21>>3/25 Levoquin 3/25 (projected 10-day course)  Cultures Blood X 2 3/21>> no growth to date (not final) Sputum - needs to be collected  Assessment and Plan: [name] is a 73 y.o. female presenting with HCAP, back, abdominal and chest pain. PMH is significant for Chronic Resp failure 2/2 COPD, malniutrition, Sacral decubitus ulcer, T2DM, and HTN.   # HCAP - in setting of COPD, acute on chronic respiratory failure  - Recent hospitalization in February  - Pulm following, appreciate recs: discontiued Vanc and aztreonam (penicillin allergic) >> deescalated to levofloxacin - Blood culture, sputum culture and Gram stain, Legionella and strep urine antigen - pending - Chronic steroid use, increased to prednisone 50 mg daily X 5-7 days (received 125 mg Solu-Medrol in ED) - decreased to 40mg  prednisone taper over 2 weeks - Duo neb every 4 hours, albuterol neb every 2 when necessary  # Abdominal pain - Possible Stomach Cancer - CT abd with ? Polypoid gastric wall thickening - GI: too frail for EGD, not candidate for lower endoscopy - Palliative consult pending  # Back pain: good relief with Hycet, just not acting long enough and not asking for it often enough - likely compression fracture related, CT abd shows new L3 compression fx -  tylenol, calcitonin - opioid analgesic scheduled q4hr while awake (Hycet: hydrocodone-acetaminophen 7.5/325 solution increased to 64mL)  # Metabolic alkalosis -  - Likely compensation from chronic respiratory acidosis caused by chronic CO2 retention  - Monitor BMP  # Sacral decubitus ulcer  - Likely that healing is inhibited by chronic steroid use, continue wound VAC  - Consult wound care nurse   # Constipation: typically takes home laxative - continue Senna  # Chest pain - Atypical chest pain. Resolved. - Trop neg X 3 - EKG with non specific T wave inversions in V1 and V2, V2 not inverted on initial EKG  # Dementia with history of depression  - Appears to be baseline mental status currently  - Continue Aricept, lloperidone, perphenazine, and Seroquel  - Some confusion on last admission 2/2 hypercarbia   # Malnutrition  - Continue Ensure, pro-stat liquid  - Tolerating regular diet per her daughter, continue nectar thick liquids  - discontinue Megace, start remeron  # T2DM  - Diet controlled, monitor with daily labs   # HTN  - 100s/50s currently  - No blood pressure meds, monitor   FEN/GI: IV hep locked, Diet: dysphasia 2, nectar thick liquids  Prophylaxis: sub q heparin   Disposition: Currently at respiratory baseline (with continued SOB but subjective improvement and baseline O2 status); continued stay for palliative care meeting for GOC discussion.  Subjective:  Pt received hycet twice yesterday. She is currently in moderate pain again. She states the pain medicine works well, but just doesn't last very long. Discussed GI not being able to do anything at this point and that pulmonology is helping [place] with her breathing. She  states she felt terrible when she first came in with her breathing but now feels back to baseline.   Objective: Temp:  [97.7 F (36.5 C)-99 F (37.2 C)] 98 F (36.7 C) (03/25 0610) Pulse Rate:  [88-104] 88 (03/25 0610) Resp:  [18-20] 20 (03/25  0610) BP: (117-119)/(55-66) 119/58 mmHg (03/25 0610) SpO2:  [94 %-100 %] 100 % (03/25 0806) Weight:  [90 lb 6.2 oz (40.998 kg)] 90 lb 6.2 oz (40.998 kg) (03/25 0610) Physical Exam: Gen: elderly and frail woman lying in bed in NAD HEENT: has nasal cannula and forehead pulse ox CV: RRR, normal S1 and S2, no murmurs Resp: good air movement, mild use of accessory muscles during breathing, pursed lip breathing again, seems to be improved  Abd: soft, still tender throughout, voluntary guarding Ext: No edema, warm to touch, symmetrical in size with no erythema bilaterally, pulses 2+, mild tenderness to palpation of L and R calves Neuro: Alert, oriented to person and place  Laboratory:  Recent Labs Lab 01/31/14 1540 02/01/14 1130 02/02/14 0635  WBC 12.1* 9.3 6.5  HGB 11.5* 10.3* 10.7*  HCT 38.4 33.3* 35.0*  PLT 261 249 248    Recent Labs Lab 02/01/14 1130 02/02/14 0635 02/03/14 0521  NA 142 143 144  K 4.1 4.3 4.4  CL 98 101 99  CO2 34* 34* 35*  BUN 16 20 22   CREATININE 0.52 0.50 0.51  CALCIUM 8.9 8.8 9.1  GLUCOSE 85 85 88   Pro BNP: 129.5 Procalcitonin: <0.1  Imaging/Diagnostic Tests: CXR 02/03/2014: IMPRESSION:  Previously noted patchy opacities of both lung bases are almost completely resolved. No pulmonary edema.  [name], Med Student 02/04/2014, 12:50 PM [facility] FPTS Intern pager: [phone], text pages welcome  I have seen patient with student doctor Lodema HongSimpson and agree with his assessment and plan. Briefly, patient is a 73 y.o. female with respiratory compromise thought due to HCAP and COPD exacerbation. She was failing to improve with IV antibiotics and pulm was consulted yesterday. They recommended eval for PE though they thought this was unlikely. TTE was ordered and LE duplex recommended. Today, patient states she feels much better and was on 2L [place] this morning. She is minimally coughing. Objectively: GEN: NAD, pleasant CV: RRR, no  m/r/g PULM: CTAB anteriorly though increased work of breathing, unsure of baseline EXTR: No le edema ,2+bilateral DP pulses NEURO: No focal deficits, appropriate participation in conversation. A/P: 73 y.o. female with COPD, DM2, HTN, sacral decub who presented with likely HCAP, started on aztreonam/vanc 2/21. - CXR much improved, subjectively improved. Pulm decreased patient to PO levaquin today. - TTE ordered, LE duplex ordered though DVT/PE unlikely. - Urine strep/legionella have not been collected. Will d/w nurse. - On pred 50 here, was on 10mg  chronically. Will start 2w taper at 40mg . - Still short of breath but subjectively much improved with relatively clear lung exam. May be baseline. - CT abdomen with ?polypoid thick proximal gastric wall. GI consulted but do not want to aggressively workup in frail elderly patietn. Palliative care consulted to discuss with patient, still awaiting recs. On my brief discussion with patient, she is amenable to whatever team deems appropriate but also states "I want to keep going." Aware of likely gastric cancer (per GI assessment). - Mentation - On megace, patient unaware of when started. Consider discontinuing given ?benefit and possibility of pulmonary edema. - Back pain - Scheduled liquid vicodin due to patient getting only twice yesterday but complaining of pain.  Byrd HesselbachMaria  Otila Back, MD PGY-2, [facility]

## 2014-02-04 NOTE — Discharge Summary (Signed)
Family Medicine Teaching [facility] Discharge Summary  Patient name: Katelyn Rojas Medical record number: [id number] Date of birth: 01-03-1941 Age: 73 y.o. Gender: female Date of Admission: 01/31/2014  Date of Discharge: 02/05/2014 Admitting Physician: Uvaldo Rising, MD  Primary Care Provider: Kirt Boys, MD Consultants: GI, Pulmonology  Indication for Hospitalization: HCAP  Discharge Diagnoses/Problem List:  HCAP COPD Acute on Chronic respiratory failure Abdominal pain due to possible gastric caner Vertebral compression fractures Sacral decubitus ulcer Chest pain, non cardiac Dementia  Depression  Disposition: Home with home health  Discharge Condition: Stable   Brief Hospital Course:  Katelyn Rojas is a 73 y.o. female presenting with oxygen desaturations, dyspnea, abdominal and back pain with recent hospital admissions found to have HCAP. PMH is significant for recent treatment of HCAP in 12/2013, Chronic Resp failure 2/2 COPD, malnutrition, Sacral decubitus ulcer, T2DM, and HTN.   Initial labs with WBC 12.1. Initial CXR with new bilateral basilar opacities. Initiated IV vancomycin and aztreonam x3 days with improvement clinically as well as by follow-up CXR followed by de-escalation to doxycycline. doxycycline to be continued for 7 days total. Workup of her dyspnea included a normal echo with 60-65% LVEF and grade I diastolic dysfunction as well as lower extremity dopplers which were normal (see below for further details).   For her COPD, she has been on chronic steroids, prednisone 10mg  daily, which was increased to 50mg  daily while inpatient. Upon discharge she has begun 2 week taper back down to her 10mg  dose.   Patient was completely intolerant of the slightest bit of palpation to her abdomen and also complained of significant back pain for the past several days prior to admission. CT scan of the abdomen and pelvis revealed a new L3 compression fracture and proximal  gastric wall thickening concerning for malignancy. Given patient's comorbid conditions no EGD was performed as GI felt she would not be able to tolerate scope and would require general anesthesia to perform it. At this point in her hospitalization Palliative care was consulted to discuss goals of care. It was determined to continue to be full code and patient's family desires all care to get patient back to her baseline.  She was discharged home, as  Family did not want SNF placement, with home health RN and PT.   Issues for Follow Up:  - Consider further workup of possible stomach cancer, may be able to tolerate EGD for Bx if she strengthens  - Continue to watch resp status closely  Significant Procedures: none  Significant Labs and Imaging:   Recent Labs Lab 01/31/14 1540 02/01/14 1130 02/02/14 0635  WBC 12.1* 9.3 6.5  HGB 11.5* 10.3* 10.7*  HCT 38.4 33.3* 35.0*  PLT 261 249 248    Recent Labs Lab 01/31/14 1540 02/01/14 1130 02/02/14 0635 02/03/14 0521  NA 140 142 143 144  K 4.8 4.1 4.3 4.4  CL 96 98 101 99  CO2 34* 34* 34* 35*  GLUCOSE 82 85 85 88  BUN 18 16 20 22   CREATININE 0.61 0.52 0.50 0.51  CALCIUM 9.3 8.9 8.8 9.1      Recent Labs Lab 01/31/14 1929 02/01/14 0138 02/01/14 0745  TROPONINI <0.30 <0.30 <0.30   Pro BNP: 155.9 (01/31/2014), 129.5 (02/03/2014)  HIV: nonreactive (01/31/2014)  CXR Portable 01/31/2014: IMPRESSION: New bibasilar pulmonary opacity superimposed on COPD. Pneumonia cannot be excluded.  CXR Portable 02/03/2014: IMPRESSION: Previously noted patchy opacities of both lung bases are almost completely resolved. No pulmonary edema.  CT abd pelvis with  contrast 02/01/2014: IMPRESSION:  1. Question polypoid thickening involving the proximal gastric wall.  2. Stool throughout the colon is indicative of constipation. Suspect associated rectal prolapse.  3. Tiny left pleural effusion.  4. Soft tissue thickening overlying the lower sacrum is in  keeping with the given history of a sacral decubitus ulcer.  5. L3 compression fracture is new from 03/01/2013. Suspect mild compression of the L1 superior endplate as well.  2D echocardiogram 02/04/2014: Study Conclusions: - Left ventricle: The cavity size was normal. There was mild concentric hypertrophy. Systolic function was normal. The estimated ejection fraction was in the range of 60% to 65%. Wall motion was normal; there were no regional wall motion abnormalities. Doppler parameters are consistent with abnormal left ventricular relaxation (grade 1 diastolic dysfunction). - Left atrium: The atrium was normal in size. - Systemic veins: The IVC was not visualized. - Pericardium, extracardiac: There was no pericardial effusion.  Lower ext venous dopplers 02/04/2014: Summary: - No evidence of deep vein or superficial thrombosis involving the right lower extremity and left lower extremity. - No evidence of Baker's cyst on the right or left.  Results/Tests Pending at Time of Discharge: Blood cx x2  Discharge Medications:    Medication List    STOP taking these medications       naproxen sodium 220 MG tablet  Commonly known as:  ANAPROX      TAKE these medications       albuterol (2.5 MG/3ML) 0.083% nebulizer solution  Commonly known as:  PROVENTIL  Take 2.5 mg by nebulization every 4 (four) hours as needed for wheezing or shortness of breath.     albuterol 108 (90 BASE) MCG/ACT inhaler  Commonly known as:  PROVENTIL HFA;VENTOLIN HFA  Inhale 1-2 puffs into the lungs every 6 (six) hours as needed for wheezing or shortness of breath.     calcitonin (salmon) 200 UNIT/ACT nasal spray  Commonly known as:  MIACALCIN/FORTICAL  Place 1 spray into alternate nostrils daily.     citalopram 10 MG/5ML suspension  Commonly known as:  CELEXA  Take 15 mg by mouth daily.     donepezil 10 MG tablet  Commonly known as:  ARICEPT  Take 10 mg by mouth at bedtime.     doxycycline 100 MG  tablet  Commonly known as:  VIBRA-TABS  Take 1 tablet (100 mg total) by mouth every 12 (twelve) hours.     ENSURE PLUS Liqd  Take 237 mLs by mouth 2 (two) times daily between meals.     famotidine 20 MG tablet  Commonly known as:  PEPCID  Take 40 mg by mouth daily.     FANAPT 1 MG Tabs  Generic drug:  Iloperidone  Take 3 mg by mouth at bedtime.     feeding supplement (PRO-STAT SUGAR FREE 64) Liqd  Take 30 mLs by mouth 2 (two) times daily with a meal.     ferrous sulfate 325 (65 FE) MG tablet  Take 325 mg by mouth 3 (three) times daily with meals.     Fluticasone-Salmeterol 250-50 MCG/DOSE Aepb  Commonly known as:  ADVAIR  Inhale 1 puff into the lungs 2 (two) times daily.     food thickener Powd  Commonly known as:  SIMPLYTHICK  Take 1 packet by mouth as needed (nectar).     furosemide 20 MG tablet  Commonly known as:  LASIX  Take 20 mg by mouth daily.     GuaiFENesin DM 400-20 MG Tabs  Take 1  tablet by mouth every 4 (four) hours as needed (congestion).     HYDROcodone-acetaminophen 7.5-325 mg/15 ml solution  Commonly known as:  HYCET  Take 10 mLs by mouth every 4 (four) hours while awake.     megestrol 40 MG/ML suspension  Commonly known as:  MEGACE  Take 400 mg by mouth daily.     mirtazapine 15 MG disintegrating tablet  Commonly known as:  REMERON SOL-TAB  Take 0.5 tablets (7.5 mg total) by mouth at bedtime.     perphenazine 2 MG tablet  Commonly known as:  TRILAFON  Take 6 mg by mouth at bedtime.     predniSONE 10 MG tablet  Commonly known as:  DELTASONE  Take 10 mg by mouth daily with breakfast.     predniSONE 20 MG tablet  Commonly known as:  DELTASONE  Take 2 tablets (40 mg total) by mouth daily with breakfast. For 4 days, then take 1.5 tabs for 4 days, then take 1 tab for 4 days then take half pill plus 10 mg pill for 4 days then take only 10 mg pill.     QUEtiapine 25 MG tablet  Commonly known as:  SEROQUEL  Take 50 mg by mouth at bedtime.      traMADol 50 MG tablet  Commonly known as:  ULTRAM  Take 50 mg by mouth every 6 (six) hours as needed for moderate pain.     Vitamin D (Ergocalciferol) 50000 UNITS Caps capsule  Commonly known as:  DRISDOL  Take 50,000 Units by mouth every 7 (seven) days. Monday        Discharge Instructions: Please refer to Patient Instructions section of EMR for full details.  Patient was counseled important signs and symptoms that should prompt return to medical care, changes in medications, dietary instructions, activity restrictions, and follow up appointments.   Follow-Up Appointments: Follow-up Information   Call [name],[name], MD. (for hospital follow-up)    Contact information:   [address] [place] [address] [phone]       Follow up with [facility]. (Registered Nurse and Physical Therapy Office- Pt will use the [facility] @ [phone])    Contact information:   [address] [place] [address] [phone]       Follow up with Johnnye Lana, MD .   Contact information:   pulmonology at [facility], please call for appt right away      Elenora Gamma, MD 02/05/2014, 3:58 PM [facility]

## 2014-02-05 DIAGNOSIS — R531 Weakness: Secondary | ICD-10-CM

## 2014-02-05 DIAGNOSIS — Z515 Encounter for palliative care: Secondary | ICD-10-CM

## 2014-02-05 LAB — GLUCOSE, CAPILLARY
GLUCOSE-CAPILLARY: 107 mg/dL — AB (ref 70–99)
GLUCOSE-CAPILLARY: 126 mg/dL — AB (ref 70–99)
Glucose-Capillary: 103 mg/dL — ABNORMAL HIGH (ref 70–99)

## 2014-02-05 LAB — PROCALCITONIN: Procalcitonin: <0.1 ng/mL

## 2014-02-05 MED ORDER — DOXYCYCLINE HYCLATE 100 MG PO TABS
100.0000 mg | ORAL_TABLET | Freq: Two times a day (BID) | ORAL | Status: DC
  Administered 2014-02-05: 100 mg via ORAL
  Filled 2014-02-05 (×2): qty 1

## 2014-02-05 MED ORDER — DOXYCYCLINE HYCLATE 100 MG PO TABS: 100.0000 mg | ORAL_TABLET | Freq: Two times a day (BID) | ORAL | Status: DC

## 2014-02-05 MED ORDER — HYDROCODONE-ACETAMINOPHEN 7.5-325 MG/15ML PO SOLN: 10.0000 mL | ORAL | Status: DC

## 2014-02-05 MED ORDER — LEVOFLOXACIN 750 MG PO TABS
750.0000 mg | ORAL_TABLET | Freq: Every day | ORAL | Status: DC
  Filled 2014-02-05: qty 1

## 2014-02-05 MED ORDER — PREDNISONE 20 MG PO TABS: 40.0000 mg | ORAL_TABLET | Freq: Every day | ORAL | Status: DC

## 2014-02-05 MED ORDER — CALCITONIN (SALMON) 200 UNIT/ACT NA SOLN: 1.0000 | Freq: Every day | NASAL | Status: DC

## 2014-02-05 MED ORDER — HYDROCODONE-ACETAMINOPHEN 7.5-325 MG/15ML PO SOLN
10.0000 mL | ORAL | Status: DC | PRN
Start: 2014-02-05 — End: 2014-06-17

## 2014-02-05 MED ORDER — MIRTAZAPINE 15 MG PO TBDP: 7.5000 mg | ORAL_TABLET | Freq: Every day | ORAL | Status: DC

## 2014-02-05 NOTE — Progress Notes (Signed)
Reviewed discharge instructions with patient's niece, she stated her understanding.  Discharged home with niece in stable condition via wheelchair.  Colman Cater

## 2014-02-05 NOTE — Evaluation (Signed)
Physical Therapy Evaluation Patient Details Name: Katelyn BruinDelsenia Hilton MRN: [id number] DOB: 08/10/1941 Today's Date: 02/05/2014   History of Present Illness  73 y.o. [facility] with dyspnea.  She was dx with acute on chronic respiratory failure due to PNA vs COPD exacerbation.  Pt with PMhx of DM, HTN, COPD, anxiety, arthritis, stage 4 sacral wound with wound vac, dementia, compression fxs of L1 and L3 vertebrae.  She is on chronic O2 at home and per chart lives with her niece and goes to [facility] during the day.    Clinical Impression  Therapist is familiar with this pt from her last admission.  She was more assistance last admission and still went home with her niece.  Her niece reports that she went home and just go up and started walking again.  She was currently active with HHPT and RN and niece would like these services to resume.  Pt is limited by DOE 3-4/4 with mobility and short distance gait, but HR and O2 sats remained stable on 3 L O2 [place].   PT to follow acutely for deficits listed below.       Follow Up Recommendations Home health PT;Supervision/Assistance - 24 hour- resume previously arranged home services.      Equipment Recommendations  None recommended by PT    Recommendations for Other Services   None    Precautions / Restrictions Precautions Precautions: Fall Precaution Comments: pt weak and deconditioned      Mobility  Bed Mobility Overal bed mobility: Needs Assistance Bed Mobility: Supine to Sit     Supine to sit: Min assist     General bed mobility comments: min assist to support trunk to get to sitting EOB.    Transfers Overall transfer level: Needs assistance Equipment used: Rolling walker (2 wheeled) Transfers: Sit to/from Stand Sit to Stand: Min assist         General transfer comment: min assist to get to standing from elevated bed with RW. Assist provided to stabilize trunk for balance and support over weak legs.    Ambulation/Gait Ambulation/Gait assistance: Min assist Ambulation Distance (Feet): 10 Feet Assistive device: Rolling walker (2 wheeled) Gait Pattern/deviations: Step-through pattern;Shuffle;Trunk flexed     General Gait Details: shuffling gait pattern and significant DOE with all mobility.  O2 sats remained greater than 90% despite DOE on 3 L O2 [place].  HR in the low 100s.        Balance Overall balance assessment: Needs assistance Sitting-balance support: Feet supported;Bilateral upper extremity supported Sitting balance-Leahy Scale: Poor   Postural control: Posterior lean Standing balance support: Bilateral upper extremity supported Standing balance-Leahy Scale: Poor                       Pertinent Vitals/Pain Hr in the low 100s during mobility, O2 sats >90% on 3 L O2 [place] throughout despite 3-4/4 DOE.      Home Living Family/patient expects to be discharged to:: Private residence Living Arrangements: Other relatives (niece) Available Help at Discharge: Family Type of Home: House Home Access: Stairs to enter   Entrance Stairs-Number of Steps: 2-3 Home Layout: Two level;Able to live on main level with bedroom/bathroom Home Equipment: Gilmer MorCane - single point;Wheelchair - manual;Bedside commode;Walker - 4 wheels Additional Comments: per niece was active with HH RN and HHPT.      Prior Function Level of Independence: Needs assistance   Gait / Transfers Assistance Needed: Per niece she could walk with  rollator to the bathroom and out to the livingroom on her own.    ADL's / Homemaking Assistance Needed: total assist for house keeping.         Hand Dominance   Dominant Hand: Right    Extremity/Trunk Assessment   Upper Extremity Assessment: Generalized weakness           Lower Extremity Assessment: Generalized weakness      Cervical / Trunk Assessment: Other exceptions  Communication   Communication: No difficulties  Cognition Arousal/Alertness:  Awake/alert Behavior During Therapy: Anxious Overall Cognitive Status: History of cognitive impairments - at baseline                      General Comments General comments (skin integrity, edema, etc.): Immediately upon standing pt's DOE increased from 2/4 to 3-4/4 with short distance gait.            Assessment/Plan    PT Assessment Patient needs continued PT services  PT Diagnosis Difficulty walking;Abnormality of gait;Generalized weakness;Acute pain   PT Problem List Decreased strength;Decreased activity tolerance;Decreased mobility;Decreased balance;Decreased knowledge of use of DME;Cardiopulmonary status limiting activity;Pain  PT Treatment Interventions DME instruction;Gait training;Stair training;Functional mobility training;Therapeutic activities;Therapeutic exercise;Balance training;Neuromuscular re-education;Cognitive remediation;Patient/family education;Modalities   PT Goals (Current goals can be found in the Care Plan section) Acute Rehab PT Goals Patient Stated Goal: pt wants to go home PT Goal Formulation: With patient Time For Goal Achievement: 02/19/14 Potential to Achieve Goals: Good    Frequency Min 3X/week   Barriers to discharge   Spoke with niece who reports she can handle her at home.       End of Session Equipment Utilized During Treatment: Gait belt;Oxygen Activity Tolerance: Patient limited by fatigue;Patient limited by pain (limited by DOE) Patient left: in chair;with call bell/phone within reach         Time: 1531-1549 PT Time Calculation (min): 18 min   Charges:   PT Evaluation $Initial PT Evaluation Tier I: 1 Procedure          Taber Sweetser B. Maitland Muhlbauer, PT, DPT [phone]   02/05/2014, 5:31 PM

## 2014-02-05 NOTE — Progress Notes (Signed)
PULMONARY / CRITICAL CARE MEDICINE  Name: Katelyn Rojas MRN: [id number] DOB: October 19, 1941    ADMISSION DATE:  01/31/2014 CONSULTATION DATE:  02/03/2014   REFERRING MD :  Danise Edge PRIMARY SERVICE: IMTS   CHIEF COMPLAINT:  Dyspnea  BRIEF PATIENT DESCRIPTION: 73 yo female with chronic respiratory failure secondary to COPD, admitted 3/21 for HCAP and AECOPD.  SIGNIFICANT EVENTS / STUDIES: 3/23 CT abd/pelvis >>> Thickening involving the proximal gastric wall, multiple compression fractures 3/25 TTE >>> EF 60, grade 1 DD 3/25 LE Doppler >>> neg  LINES / TUBES:  CULTURES: 3/21 Blood  >>>  ANTIBIOTICS: Aztreonam 3/21 >>> Vancomycin 3/21 >>> Levaquin 3/25 x 1 Doxycycline 3/26 >>>   INTERVAL HISTORY: No issues.  VITAL SIGNS: Temp:  [98.6 F (37 C)-98.7 F (37.1 C)] 98.7 F (37.1 C) (03/26 0431) Pulse Rate:  [96-107] 96 (03/26 0431) Resp:  [20] 20 (03/25 2142) BP: (114-134)/(51-58) 114/51 mmHg (03/26 0431) SpO2:  [94 %-100 %] 94 % (03/26 0431) Weight:  [39.463 kg (87 lb)] 39.463 kg (87 lb) (03/26 0431)  HEMODYNAMICS:   VENTILATOR SETTINGS:   INTAKE / OUTPUT: Intake/Output     03/25 0701 - 03/26 0700 03/26 0701 - 03/27 0700   P.O. 714    I.V. (mL/kg) 3 (0.1)    Total Intake(mL/kg) 717 (18.2)    Net +717          Urine Occurrence 3 x    Stool Occurrence 3 x      PHYSICAL EXAMINATION: General:  Comfortable, in no distress Neuro:  Awake, somewhat confused HEENT:  PERRL Cardiovascular:  Regular, no murmurs Lungs: Bilateral diminished air entry, no added sounds Abdomen:  Soft, bowel sounds present Musculoskeletal:  Weak Skin:  Sacral decub    LABS:  CBC  Recent Labs Lab 01/31/14 1540 02/01/14 1130 02/02/14 0635  WBC 12.1* 9.3 6.5  HGB 11.5* 10.3* 10.7*  HCT 38.4 33.3* 35.0*  PLT 261 249 248   Coag's No results found for this basename: APTT, INR,  in the last 168 hours  BMET  Recent Labs Lab 02/01/14 1130 02/02/14 0635 02/03/14 0521  NA 142 143  144  K 4.1 4.3 4.4  CL 98 101 99  CO2 34* 34* 35*  BUN 16 20 22   CREATININE 0.52 0.50 0.51  GLUCOSE 85 85 88   Electrolytes  Recent Labs Lab 02/01/14 1130 02/02/14 0635 02/03/14 0521  CALCIUM 8.9 8.8 9.1   Sepsis Markers  Recent Labs Lab 02/03/14 1535  PROCALCITON <0.10   ABG No results found for this basename: PHART, PCO2ART, PO2ART,  in the last 168 hours Liver Enzymes No results found for this basename: AST, ALT, ALKPHOS, BILITOT, ALBUMIN,  in the last 168 hours Cardiac Enzymes  Recent Labs Lab 01/31/14 1540 01/31/14 1929 02/01/14 0138 02/01/14 0745 02/03/14 1535  TROPONINI  --  <0.30 <0.30 <0.30  --   PROBNP 155.9*  --   --   --  129.5*   Glucose  Recent Labs Lab 02/03/14 2153 02/04/14 0809 02/04/14 1203 02/04/14 1622 02/04/14 2142 02/05/14 0737  GLUCAP 174* 110* 134* 159* 143* 103*   IMAGING:   Dg Chest Port 1 View  02/03/2014   CLINICAL DATA:  Dyspnea  EXAM: PORTABLE CHEST - 1 VIEW  COMPARISON:  January 31, 2014  FINDINGS: The heart size and mediastinal contours are stable. The previously noted patchy opacities of both lung bases are much less prominent. There is no pulmonary edema or pleural effusion. The visualized skeletal structures are stable.  IMPRESSION: Previously noted patchy opacities of both lung bases are almost completely resolved. No pulmonary edema.   Electronically Signed   By: Sherian ReinWei-Chen  Lin M.D.   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  Please re consult PRN.  I have personally obtained history, examined patient, evaluated and interpreted laboratory and imaging results, reviewed medical records, formulated assessment / plan and placed orders.  Lonia FarberZUBELEVITSKIY, Katelyn Hauth, MD Pulmonary and Critical Care Medicine [facility] Pager: [phone]  02/05/2014, 10:18 AM

## 2014-02-05 NOTE — Discharge Instructions (Signed)
You have been hospitalized for an increased oxygen requirement at home as well as for shortness of breath. You have had multiple tests which have shown that you had a pneumonia. We treated your pneumonia, but you should continue your antibiotics that we prescribed for its full course. We have also increased your steroids for your COPD. You should continue to incrementally decrease them to your normal level of 10mg  daily as prescribed. Following teh directions you will decrease by 10 mg every 4 days until you are back at the 10 mg pill she was at before.   While you were here we also got a CT of your stomach which showed a mass that is possibly cancerous. This mass is likely causing your belly pain. At this time we are not able to take a biopsy of the mass because of your other medical problems. This same CT showed a new compression fracture of your spine which is likely the cause of your back pain. For both of these pains we have prescribed you a pain medicine, called hydrocodone.   It is important to follow-up with your primary doctor within a week. Please call and make that appointment. It is also very important to continue your home health and physical therapy.  You should return to the emergency room for any fever greater than 100.4 degrees F, shortness of breath, or new desaturations uncontrolled on your normal oxygen levels.  She was started on Remeron to stimulate her appetite She was started on calcitonin nasal spray to relieve her back pain due to compression fractures.

## 2014-02-05 NOTE — Progress Notes (Signed)
Family Medicine Teaching Service Daily Progress Note Intern Pager: [phone]  Patient name: Katelyn Rojas Medical record number: [id number] Date of birth: March 05, 1941 Age: 73 y.o. Gender: female  Primary Care Provider: Kirt Boys, MD Consultants: Palliative care, GI, pulm Code Status: Full  Pt Overview and Major Events to Date:  3/21: Admitted 3/22: Continued back and abd pain 3/23: CT Abd with ? Polypoid gastric wall thickening, GI consulted but pt unable to tolerate EGD 3/24: pulmonology consults 3/25: de-escalation of IV antibiotics to levoquin, begin prednisone taper, echo and lower ext dopplers 3/26: discharge  Antibiotics Vanc 3/21>>3/25 Aztreonam 3/21>>3/25 Levoquin 3/25 (projected 10-day course)  Cultures Blood X 2 3/21>> no growth to date (not final) Sputum - patient not able to produce sample  Assessment and Plan: Katelyn Rojas is a 73 y.o. female presenting with HCAP, back, abdominal and chest pain. PMH is significant for Chronic Resp failure 2/2 COPD, malniutrition, Sacral decubitus ulcer, T2DM, and HTN.   # HCAP - in setting of COPD, acute on chronic respiratory failure  - Recent hospitalization in February  - Pulm following, appreciate recs: initially vanc and aztreonam (penicillin allergic) deescalated to levofloxacin - Blood culture no growth to date - patient unable to provide sputum culture  - patient incontinent - unable to collect urine for Legionella and strep urine antigen - Chronic steroid use- 40mg  prednisone to taper over 2 weeks back to baseline 10mg  - Duo neb every 4 hours, albuterol neb every 2 when necessary  # Abdominal pain - Possible Stomach Cancer - CT abd with ? Polypoid gastric wall thickening - GI: too frail for EGD, not candidate for lower endoscopy - Palliative care with goals of care: full code, family and patient want all care to get patient back to baseline and take care of her at home.  # Back pain: good relief with Hycet,  just not acting long enough and not asking for it often enough - likely compression fracture related, CT abd shows new L3 compression fx - tylenol, calcitonin - opioid analgesic scheduled q4hr while awake (Hycet: hydrocodone-acetaminophen 7.5/325 solution increased to 68mL)  # Metabolic alkalosis -  - Likely compensation from chronic respiratory acidosis caused by chronic CO2 retention   # Sacral decubitus ulcer  - Likely that healing is inhibited by chronic steroid use, continue wound VAC  - Consulted wound care nurse   # Constipation: typically takes home laxative - continue Senna  # Chest pain - Atypical chest pain. Resolved. - Trop neg X 3 - EKG with non specific T wave inversions in V1 and V2, V2 not inverted on initial EKG  # Dementia with history of depression  - Appears to be baseline mental status currently  - Continue Aricept, lloperidone, perphenazine, and Seroquel  - Some confusion on last admission 2/2 hypercarbia   # Malnutrition  - Continue Ensure, pro-stat liquid  - Tolerating regular diet per her daughter, continue nectar thick liquids  - discontinued Megace, started remeron  # T2DM  - Diet controlled, monitor with daily labs   # HTN  - 100s/50s currently  - No blood pressure meds, monitor   FEN/GI: IV hep locked, Diet: dysphasia 2, nectar thick liquids  Prophylaxis: sub q heparin   Disposition: Clinically and radiographically improved to baseline. Discharge to home with 24 hour assistance  Subjective:  Pt received hycet x3 yesterday. Pt sleeping with BiPap.   Objective: Temp:  [98.6 F (37 C)-98.7 F (37.1 C)] 98.7 F (37.1 C) (03/26 0431) Pulse Rate:  [96-107]  96 (03/26 0431) Resp:  [20] 20 (03/25 2142) BP: (103-134)/(39-58) 103/39 mmHg (03/26 1038) SpO2:  [94 %-100 %] 99 % (03/26 1021) Weight:  [87 lb (39.463 kg)] 87 lb (39.463 kg) (03/26 0431) Physical Exam: Gen: elderly and frail woman asleep with bipap HEENT: bipap CV: RRR, normal S1 and  S2 Resp: bipap  Abd: soft, remains tender, normal BS Ext: No edema, warm to touch, pulses 2+ Neuro: not assessed this am  Laboratory:  Recent Labs Lab 01/31/14 1540 02/01/14 1130 02/02/14 0635  WBC 12.1* 9.3 6.5  HGB 11.5* 10.3* 10.7*  HCT 38.4 33.3* 35.0*  PLT 261 249 248    Recent Labs Lab 02/01/14 1130 02/02/14 0635 02/03/14 0521  NA 142 143 144  K 4.1 4.3 4.4  CL 98 101 99  CO2 34* 34* 35*  BUN 16 20 22   CREATININE 0.52 0.50 0.51  CALCIUM 8.9 8.8 9.1  GLUCOSE 85 85 88   Procalcitonin: <0.10  Imaging/Diagnostic Tests: 2D Echo 02/04/2014: Study Conclusions - Left ventricle: The cavity size was normal. There was mild concentric hypertrophy. Systolic function was normal. The estimated ejection fraction was in the range of 60% to 65%. Wall motion was normal; there were no regional wall motion abnormalities. Doppler parameters are consistent with abnormal left ventricular relaxation (grade 1 diastolic dysfunction). - Left atrium: The atrium was normal in size. - Systemic veins: The IVC was not visualized. - Pericardium, extracardiac: There was no pericardial effusion.  Lower Extremity Venous Doppler 02/04/2014: Summary: - No evidence of deep vein or superficial thrombosis involving the right lower extremity and left lower extremity. - No evidence of Baker's cyst on the right or left.  Katelyn Rojas, Med Student 02/05/2014, 12:43 PM [facility] FPTS Intern pager: [phone], text pages welcome  I have seen and examined the patient with student Dr. Lodema HongSimpson and agree with his documentation above. My annotations are in blue and my findings are summarized below.   S: Feeling better, feels like she is breathing normally. Back and abd pain continued.   O:  Gen: NAD, alert, cooperative with exam HEENT: NCAT CV: RRR, good S1/S2, no murmur Resp:Pursed lip breathing, diminished air sounds but clear, 3 L [place], mildly increased WOB Abd: Soft, + BS,  tenderness to palpation throughout Ext: No edema, warm Neuro: Alert and oriented, No gross deficits  A/P 73 y/o female here with HCAP on severe chronic resp failure due to sever COPD. She has been found to have concern for stomach cancer and does have new vertebral fractures since the beginning of this stay. She is too high risk to undergo diagnostic EGD. She is on 3 L  and transitioned to PO antibiotics and steroids. She and her family do not want SNF placement so [facility].   Murtis SinkSam Paidyn Mcferran, MD [facility] Resident, PGY-2 02/05/2014, 4:20 PM

## 2014-02-05 NOTE — Progress Notes (Signed)
Placed patient on home CPAP via FFM, with 3 lpm O2 bleed in.  Patient tolerating well at this time.

## 2014-02-05 NOTE — Progress Notes (Signed)
FMTS Attending Daily Note: Rozanna Cormany MD [phone] pager office [phone] I  have seen and examined this patient, reviewed their chart. I have discussed this patient with the resident. I agree with the resident's findings, assessment and care plan. 

## 2014-02-07 LAB — CULTURE, BLOOD (ROUTINE X 2)
Culture: NO GROWTH
Culture: NO GROWTH

## 2014-02-08 NOTE — Consult Note (Signed)
I have reviewed this case with our NP and agree with the Assessment and Plan as stated.  Azyriah Nevins L. Sahan Pen, MD MBA The Palliative Medicine Team at [place] Team Phone: [phone] Pager: [phone]   

## 2014-02-09 NOTE — Discharge Summary (Signed)
Family Medicine Teaching Service  Discharge Note : Attending Raeli Wiens MD Pager [phone] Office [phone] I have seen and examined this patient, reviewed their chart and discussed discharge planning wit the resident at the time of discharge. I agree with the discharge plan as above.  

## 2014-03-24 ENCOUNTER — Other Ambulatory Visit (HOSPITAL_COMMUNITY): Payer: Self-pay

## 2014-03-24 ENCOUNTER — Inpatient Hospital Stay
Admission: AD | Admit: 2014-03-24 | Discharge: 2014-05-25 | Disposition: A | Discharge status: Skilled Nursing Facility | Payer: Medicare Other | Source: Ambulatory Visit | Attending: Internal Medicine | Admitting: Internal Medicine

## 2014-03-24 DIAGNOSIS — R933 Abnormal findings on diagnostic imaging of other parts of digestive tract: Secondary | ICD-10-CM

## 2014-03-24 DIAGNOSIS — Z930 Tracheostomy status: Secondary | ICD-10-CM

## 2014-03-24 DIAGNOSIS — J9610 Chronic respiratory failure, unspecified whether with hypoxia or hypercapnia: Secondary | ICD-10-CM

## 2014-03-24 DIAGNOSIS — J9600 Acute respiratory failure, unspecified whether with hypoxia or hypercapnia: Secondary | ICD-10-CM

## 2014-03-24 DIAGNOSIS — J449 Chronic obstructive pulmonary disease, unspecified: Secondary | ICD-10-CM

## 2014-03-24 DIAGNOSIS — F0390 Unspecified dementia without behavioral disturbance: Secondary | ICD-10-CM

## 2014-03-24 DIAGNOSIS — S22000A Wedge compression fracture of unspecified thoracic vertebra, initial encounter for closed fracture: Secondary | ICD-10-CM

## 2014-03-24 DIAGNOSIS — K9423 Gastrostomy malfunction: Secondary | ICD-10-CM

## 2014-03-24 DIAGNOSIS — R1310 Dysphagia, unspecified: Secondary | ICD-10-CM

## 2014-03-24 DIAGNOSIS — J189 Pneumonia, unspecified organism: Secondary | ICD-10-CM

## 2014-03-24 LAB — BLOOD GAS, ARTERIAL
Acid-Base Excess: 17.8 mmol/L — ABNORMAL HIGH (ref 0.0–2.0)
Bicarbonate: 43.1 mEq/L — ABNORMAL HIGH (ref 20.0–24.0)
DELIVERY SYSTEMS: POSITIVE
EXPIRATORY PAP: 6
FIO2: 0.4 %
Inspiratory PAP: 12
O2 SAT: 98.4 %
PH ART: 7.463 — AB (ref 7.350–7.450)
Patient temperature: 98.6
TCO2: 44.9 mmol/L (ref 0–100)
pCO2 arterial: 61 mmHg (ref 35.0–45.0)
pO2, Arterial: 77.6 mmHg — ABNORMAL LOW (ref 80.0–100.0)

## 2014-03-25 ENCOUNTER — Other Ambulatory Visit (HOSPITAL_COMMUNITY): Payer: Self-pay

## 2014-03-25 LAB — MAGNESIUM: MAGNESIUM: 2 mg/dL (ref 1.5–2.5)

## 2014-03-25 LAB — PREALBUMIN: Prealbumin: 24.1 mg/dL (ref 17.0–34.0)

## 2014-03-25 LAB — BLOOD GAS, ARTERIAL
Acid-Base Excess: 14.4 mmol/L — ABNORMAL HIGH (ref 0.0–2.0)
BICARBONATE: 39.1 meq/L — AB (ref 20.0–24.0)
Delivery systems: POSITIVE
Expiratory PAP: 5
FIO2: 0.4 %
Inspiratory PAP: 12
O2 SAT: 99.3 %
PATIENT TEMPERATURE: 99.5
PO2 ART: 147 mmHg — AB (ref 80.0–100.0)
TCO2: 40.7 mmol/L (ref 0–100)
pCO2 arterial: 55 mmHg — ABNORMAL HIGH (ref 35.0–45.0)
pH, Arterial: 7.468 — ABNORMAL HIGH (ref 7.350–7.450)

## 2014-03-25 LAB — COMPREHENSIVE METABOLIC PANEL WITH GFR
ALT: 25 U/L (ref 0–35)
AST: 17 U/L (ref 0–37)
Albumin: 2.7 g/dL — ABNORMAL LOW (ref 3.5–5.2)
Alkaline Phosphatase: 71 U/L (ref 39–117)
BUN: 21 mg/dL (ref 6–23)
CO2: 40 meq/L (ref 19–32)
Calcium: 9 mg/dL (ref 8.4–10.5)
Chloride: 95 meq/L — ABNORMAL LOW (ref 96–112)
Creatinine, Ser: 0.47 mg/dL — ABNORMAL LOW (ref 0.50–1.10)
GFR calc Af Amer: >90 mL/min (ref >90)
GFR calc non Af Amer: >90 mL/min (ref >90)
Glucose, Bld: 160 mg/dL — ABNORMAL HIGH (ref 70–99)
Potassium: 4 meq/L (ref 3.7–5.3)
Sodium: 143 meq/L (ref 137–147)
Total Bilirubin: 0.4 mg/dL (ref 0.3–1.2)
Total Protein: 5.3 g/dL — ABNORMAL LOW (ref 6.0–8.3)

## 2014-03-25 LAB — TSH: TSH: 0.23 u[IU]/mL — AB (ref 0.350–4.500)

## 2014-03-25 LAB — CBC
HEMATOCRIT: 32.4 % — AB (ref 36.0–46.0)
Hemoglobin: 9.8 g/dL — ABNORMAL LOW (ref 12.0–15.0)
MCH: 29.8 pg (ref 26.0–34.0)
MCHC: 30.2 g/dL (ref 30.0–36.0)
MCV: 98.5 fL (ref 78.0–100.0)
Platelets: 171 10*3/uL (ref 150–400)
RBC: 3.29 MIL/uL — ABNORMAL LOW (ref 3.87–5.11)
RDW: 13.5 % (ref 11.5–15.5)
WBC: 7.9 10*3/uL (ref 4.0–10.5)

## 2014-03-25 LAB — PROCALCITONIN: Procalcitonin: <0.1 ng/mL

## 2014-03-25 LAB — VANCOMYCIN, TROUGH: Vancomycin Tr: 10.5 ug/mL (ref 10.0–20.0)

## 2014-03-25 LAB — PHOSPHORUS: PHOSPHORUS: 2.7 mg/dL (ref 2.3–4.6)

## 2014-03-26 ENCOUNTER — Other Ambulatory Visit (HOSPITAL_COMMUNITY): Payer: Self-pay

## 2014-03-26 DIAGNOSIS — J189 Pneumonia, unspecified organism: Secondary | ICD-10-CM

## 2014-03-26 DIAGNOSIS — J9620 Acute and chronic respiratory failure, unspecified whether with hypoxia or hypercapnia: Secondary | ICD-10-CM | POA: Diagnosis not present

## 2014-03-26 LAB — BLOOD GAS, ARTERIAL
ACID-BASE EXCESS: 14.1 mmol/L — AB (ref 0.0–2.0)
Acid-Base Excess: 14.1 mmol/L — ABNORMAL HIGH (ref 0.0–2.0)
BICARBONATE: 38.3 meq/L — AB (ref 20.0–24.0)
Bicarbonate: 38 mEq/L — ABNORMAL HIGH (ref 20.0–24.0)
FIO2: 1 %
FIO2: 0.4 %
MECHVT: 380 mL
MECHVT: 380 mL
O2 SAT: 95.5 %
O2 Saturation: 100 %
PCO2 ART: 48.3 mmHg — AB (ref 35.0–45.0)
PEEP/CPAP: 5 cmH2O
PEEP: 5 cmH2O
PO2 ART: 69.1 mmHg — AB (ref 80.0–100.0)
Patient temperature: 98.6
Patient temperature: 98.6
RATE: 20 resp/min
RATE: 10 resp/min
TCO2: 39.4 mmol/L (ref 0–100)
TCO2: 39.8 mmol/L (ref 0–100)
pCO2 arterial: 45 mmHg (ref 35.0–45.0)
pH, Arterial: 7.536 — ABNORMAL HIGH (ref 7.350–7.450)
pH, Arterial: 7.511 — ABNORMAL HIGH (ref 7.350–7.450)
pO2, Arterial: 236 mmHg — ABNORMAL HIGH (ref 80.0–100.0)

## 2014-03-26 MED ORDER — IOHEXOL 300 MG/ML  SOLN: 50.0000 mL | Freq: Once | INTRAMUSCULAR | Status: AC | PRN

## 2014-03-26 NOTE — Consult Note (Signed)
Name: Katelyn Rojas MRN: [id number] DOB: February 10, 1941    ADMISSION DATE:  03/24/2014 CONSULTATION DATE:  03/26/2014  REFERRING MD :  Dr. Sharyon Medicus PRIMARY SERVICE:  [facility]  CHIEF COMPLAINT:  Acute on chronic respiratory failure  BRIEF PATIENT DESCRIPTION: 73 year old ES-COPD female with failure to thrive (88 lbs) who was intubated multiple times in an outside facility presenting to [facility] for rehab and deteriorating.  Family wants full code status including trach/peg.  SIGNIFICANT EVENTS / STUDIES:  5/14 intubated for respiratory failure.  LINES / TUBES: ETT 5/14>>>  CULTURES: Per [facility]  ANTIBIOTICS: Per [facility]  PAST MEDICAL HISTORY :  Past Medical History  Diagnosis Date  . Hypertension   . 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 Arthritis   . Anxiety   . Cachexia 12/2013  . DM type 2 (diabetes mellitus, type 2)   . 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 Anemia 12/2013  . Dementia   . Compression fracture of lumbar vertebra, non-traumatic 12/2013    L3 and L1.    Past Surgical History  Procedure Laterality Date  . Vaginal hysterectomy      ? Abdominal vs. Vaginal   Prior to Admission medications   Medication Sig Start Date End Date Taking? Authorizing Provider  albuterol (PROVENTIL HFA;VENTOLIN HFA) 108 (90 BASE) MCG/ACT inhaler Inhale 1-2 puffs into the lungs every 6 (six) hours as needed for wheezing or shortness of breath.    Historical Provider, MD  albuterol (PROVENTIL) (2.5 MG/3ML) 0.083% nebulizer solution Take 2.5 mg by nebulization every 4 (four) hours as needed for wheezing or shortness of breath.    Historical Provider, MD  Amino Acids-Protein Hydrolys (FEEDING SUPPLEMENT, PRO-STAT SUGAR FREE 64,) LIQD Take 30 mLs by mouth 2 (two) times daily with a meal. 12/31/13   Saralyn Pilar, DO  calcitonin, salmon, (MIACALCIN/FORTICAL) 200 UNIT/ACT nasal spray Place 1 spray into alternate nostrils daily. 02/05/14   Elenora Gamma, MD    citalopram (CELEXA) 10 MG/5ML suspension Take 15 mg by mouth daily.    Historical Provider, MD  Dextromethorphan-Guaifenesin (GUAIFENESIN DM) 400-20 MG TABS Take 1 tablet by mouth every 4 (four) hours as needed (congestion).    Historical Provider, MD  donepezil (ARICEPT) 10 MG tablet Take 10 mg by mouth at bedtime.    Historical Provider, MD  doxycycline (VIBRA-TABS) 100 MG tablet Take 1 tablet (100 mg total) by mouth every 12 (twelve) hours. Hold iron pills for 9 days while taking this med 02/05/14   Elenora Gamma, MD  ENSURE PLUS (ENSURE PLUS) LIQD Take 237 mLs by mouth 2 (two) times daily between meals.    Historical Provider, MD  famotidine (PEPCID) 20 MG tablet Take 40 mg by mouth daily.     Historical Provider, MD  ferrous sulfate 325 (65 FE) MG tablet Take 325 mg by mouth 3 (three) times daily with meals.    Historical Provider, MD  Fluticasone-Salmeterol (ADVAIR) 250-50 MCG/DOSE AEPB Inhale 1 puff into the lungs 2 (two) times daily.    Historical Provider, MD  food thickener (SIMPLYTHICK) POWD Take 1 packet by mouth as needed (nectar).    Historical Provider, MD  furosemide (LASIX) 20 MG tablet Take 20 mg by mouth daily.    Historical Provider, MD  HYDROcodone-acetaminophen (HYCET) 7.5-325 mg/15 ml solution Take 10 mLs by mouth every 4 (four) hours as needed for moderate pain. 02/05/14   Elenora Gamma, MD  Iloperidone (FANAPT) 1 MG TABS  Take 3 mg by mouth at bedtime.    Historical Provider, MD  megestrol (MEGACE) 40 MG/ML suspension Take 400 mg by mouth daily.    Historical Provider, MD  mirtazapine (REMERON SOL-TAB) 15 MG disintegrating tablet Take 0.5 tablets (7.5 mg total) by mouth at bedtime. 02/05/14   Elenora GammaSamuel L Bradshaw, MD  perphenazine (TRILAFON) 2 MG tablet Take 6 mg by mouth at bedtime.    Historical Provider, MD  predniSONE (DELTASONE) 10 MG tablet Take 10 mg by mouth daily with breakfast.    Historical Provider, MD  predniSONE (DELTASONE) 20 MG tablet Take 2 tablets (40 mg  total) by mouth daily with breakfast. For 4 days, then take 1.5 tabs for 4 days, then take 1 tab for 4 days then take half pill plus 10 mg pill for 4 days then take only 10 mg pill. 02/05/14   Elenora GammaSamuel L Bradshaw, MD  QUEtiapine (SEROQUEL) 25 MG tablet Take 50 mg by mouth at bedtime.     Historical Provider, MD  traMADol (ULTRAM) 50 MG tablet Take 50 mg by mouth every 6 (six) hours as needed for moderate pain.    Historical Provider, MD  Vitamin D, Ergocalciferol, (DRISDOL) 50000 UNITS CAPS capsule Take 50,000 Units by mouth every 7 (seven) days. Monday    Historical Provider, MD   Allergies  Allergen Reactions  . Namenda [Memantine Hcl] Other (See Comments)    "hallucinations"   . Penicillins Other (See Comments)    unknown    FAMILY HISTORY:  No family history on file. SOCIAL HISTORY:  reports that she has quit smoking. She does not have any smokeless tobacco history on file. Her alcohol and drug histories are not on file.  REVIEW OF SYSTEMS:   Unattainable, patient is encephalopathic  SUBJECTIVE: Moans but not answering questions.  VITAL SIGNS:  [facility].  PHYSICAL EXAMINATION: General:   Chronically ill appearing cachectic female encephalopathic on BiPAP. Neuro:  Obtunded, moves all ext to pain but not command, HEENT:  PERRL, EOM-I and DMM. Neck:  Supple, -LAN and -thyromegally. Cardiovascular:  RRR, Nl S1/S2, -M/R/G. Lungs:  Coarse BS diffusely, end exp wheezes even on BiPAP. Abdomen:  Soft, NT, ND and +BS. Musculoskeletal:  -edema and -tenderness. Skin:  Sacral decub stage 3   Recent Labs Lab 03/25/14 0616  NA 143  K 4.0  CL 95*  CO2 40*  BUN 21  CREATININE 0.47*  GLUCOSE 160*    Recent Labs Lab 03/25/14 0616  HGB 9.8*  HCT 32.4*  WBC 7.9  PLT 171   Dg Chest Port 1 View  03/25/2014   CLINICAL DATA:  Respiratory failure  EXAM: PORTABLE CHEST - 1 VIEW  COMPARISON:  Portable chest x-ray of 03/24/2014  FINDINGS: There is opacity of both lung  bases consistent with effusions and atelectasis. Pneumonia cannot be excluded particularly at the left lung base. The lungs do appear to be somewhat hyperaerated suggesting a degree of emphysema. Heart size is stable. A right PICC line is unchanged.  IMPRESSION: No change in bibasilar opacities consistent effusions and atelectasis. Pneumonia at the lung bases cannot be excluded.   Electronically Signed   By: Dwyane DeePaul  Barry M.D.   On: 03/25/2014 08:16   Dg Chest Port 1 View  03/24/2014   CLINICAL DATA:  Status post PICC line placement  EXAM: PORTABLE CHEST - 1 VIEW  COMPARISON:  03/22/2014  FINDINGS: A new right-sided PICC line is noted with the catheter tip at the cavoatrial junction.  Bilateral effusions and bibasilar atelectatic changes are seen. The remainder the study is stable from the prior exam.  IMPRESSION: PICC line in satisfactory position.  Bibasilar infiltrates and effusions stable from the prior study.   Electronically Signed   By: Alcide Clever M.D.   On: 03/24/2014 22:29   Dg Abd Portable 1v  03/26/2014   CLINICAL DATA:  Feeding tube placement.  EXAM: PORTABLE ABDOMEN - 1 VIEW  COMPARISON:  CT ABD/PELVIS W CM dated 02/01/2014; XR-ABDOMEN-KUB/FLAT PLATE dated 9/44/9675  FINDINGS: Feeding tube tip is in the left mid abdomen, likely in the lower stomach. Atelectasis in the lung bases. Stool-filled colon without bowel distention.  IMPRESSION: Feeding tube tip is in the left mid abdomen consistent with location in the lower stomach.   Electronically Signed   By: Burman Nieves M.D.   On: 03/26/2014 01:28    ASSESSMENT / PLAN:  73 year old female in ES-COPD, severely cachectic, SSH-MD spoke with family they still want everything done.  Respiratory failure primarily due to severe COPD and deconditioning.  Family has very unrealistic expectations.    Plan: - Intubate. - Arrange for trach either tomorrow or early next week. - Continue abx as ordered. - F/U on culture. - Needs additional  diureses. - Steroids and BD as ordered. - Needs EOL discussion, patient will not survive this intact.  CC time 35 min.  [name], M.D. [facility] Pulmonary/Critical Care Medicine. Pager: [phone]. After hours pager: [phone].

## 2014-03-26 NOTE — Progress Notes (Signed)
[facility]                                                                                              Progress note     Patient Demographics  Guerry BruinDelsenia Walle, is a 73 y.o. female  ZOX:[id number]  WJX:[id number]  DOB - 05/03/1941  Admit date - 03/24/2014  Admitting Physician Carron CurieAli Alva Broxson, MD  Outpatient Primary MD for the patient is [name],[name], MD  LOS - 2   Chief complaint    Respiratory failure    Protein calorie malnutrition         Subjective:   Ann Polakowski   Objective:   Vital signs  Temperature 98.1 Heart rate 71 Respiratory rate 14 Blood pressure 102/43 Pulse ox 93%    Exam Obtunded,  [place].AT, ET tube in place Supple Neck,No JVD, No cervical lymphadenopathy appreciated.  Symmetrical Chest wall movement, decreased breath sounds bilaterally basally,  RRR,No Gallops,Rubs or new Murmurs, No Parasternal Heave +ve B.Sounds, Abd Soft, Non tender, No organomegaly appriciated, No rebound - guarding or rigidity. No Cyanosis, Clubbing or edema, No new Rashes or bruise     I&Os- 80 Foley - yes ETT -yes  Data Review   CBC  Recent Labs Lab 03/25/14 0616  WBC 7.9  HGB 9.8*  HCT 32.4*  PLT 171  MCV 98.5  MCH 29.8  MCHC 30.2  RDW 13.5    Chemistries   Recent Labs Lab 03/25/14 0616  NA 143  K 4.0  CL 95*  CO2 40*  GLUCOSE 160*  BUN 21  CREATININE 0.47*  CALCIUM 9.0  MG 2.0  AST 17  ALT 25  ALKPHOS 71  BILITOT 0.4   ------------------------------------------------------------------------------------------------------------------ CrCl is unknown because both a height and weight (above a minimum accepted value) are required for this calculation. ------------------------------------------------------------------------------------------------------------------ No results found for this basename: HGBA1C,  in the last 72  hours ------------------------------------------------------------------------------------------------------------------ No results found for this basename: CHOL, HDL, LDLCALC, TRIG, CHOLHDL, LDLDIRECT,  in the last 72 hours ------------------------------------------------------------------------------------------------------------------  Recent Labs  03/25/14 0616  TSH 0.230*   ------------------------------------------------------------------------------------------------------------------ No results found for this basename: VITAMINB12, FOLATE, FERRITIN, TIBC, IRON, RETICCTPCT,  in the last 72 hours  Coagulation profile No results found for this basename: INR, PROTIME,  in the last 168 hours  No results found for this basename: DDIMER,  in the last 72 hours  Cardiac Enzymes No results found for this basename: CK, CKMB, TROPONINI, MYOGLOBIN,  in the last 168 hours ------------------------------------------------------------------------------------------------------------------ No components found with this basename: POCBNP,   Micro Results No results found for this or any previous visit (from the past 240 hour(s)).     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

## 2014-03-26 NOTE — Procedures (Signed)
Intubation Procedure Note Katelyn Rojas [id number] 1941/03/06  Procedure: Intubation Indications: Respiratory insufficiency  Procedure Details Consent: Unable to obtain consent because of emergent medical necessity. Time Out: Verified patient identification, verified procedure, site/side was marked, verified correct patient position, special equipment/implants available, medications/allergies/relevent history reviewed, required imaging and test results available.  Performed  Maximum sterile technique was used including gloves, hand hygiene and mask.  MAC    Evaluation Hemodynamic Status: BP stable throughout; O2 sats: stable throughout [name] Current Condition: stable Complications: No apparent complications Patient did tolerate procedure well. Chest X-ray ordered to verify placement.  CXR: pending.   [name] 03/26/2014

## 2014-03-27 ENCOUNTER — Other Ambulatory Visit (HOSPITAL_COMMUNITY): Payer: Self-pay

## 2014-03-27 DIAGNOSIS — F0390 Unspecified dementia without behavioral disturbance: Secondary | ICD-10-CM | POA: Diagnosis not present

## 2014-03-27 DIAGNOSIS — J9610 Chronic respiratory failure, unspecified whether with hypoxia or hypercapnia: Secondary | ICD-10-CM

## 2014-03-27 DIAGNOSIS — J9600 Acute respiratory failure, unspecified whether with hypoxia or hypercapnia: Secondary | ICD-10-CM

## 2014-03-27 LAB — CBC
HCT: 28.5 % — ABNORMAL LOW (ref 36.0–46.0)
HEMOGLOBIN: 8.8 g/dL — AB (ref 12.0–15.0)
MCH: 30 pg (ref 26.0–34.0)
MCHC: 30.9 g/dL (ref 30.0–36.0)
MCV: 97.3 fL (ref 78.0–100.0)
Platelets: 152 10*3/uL (ref 150–400)
RBC: 2.93 MIL/uL — AB (ref 3.87–5.11)
RDW: 13.5 % (ref 11.5–15.5)
WBC: 16 10*3/uL — AB (ref 4.0–10.5)

## 2014-03-27 LAB — BASIC METABOLIC PANEL
BUN: 18 mg/dL (ref 6–23)
CALCIUM: 8.5 mg/dL (ref 8.4–10.5)
CO2: 37 meq/L — AB (ref 19–32)
Chloride: 94 mEq/L — ABNORMAL LOW (ref 96–112)
Creatinine, Ser: 0.53 mg/dL (ref 0.50–1.10)
GFR calc Af Amer: >90 mL/min (ref >90)
GFR calc non Af Amer: >90 mL/min (ref >90)
GLUCOSE: 98 mg/dL (ref 70–99)
Potassium: 3.8 mEq/L (ref 3.7–5.3)
Sodium: 137 mEq/L (ref 137–147)

## 2014-03-27 NOTE — Consult Note (Signed)
Name: Guerry BruinDelsenia Runions MRN: [id number] DOB: 06/10/1941    ADMISSION DATE:  03/24/2014 CONSULTATION DATE:  03/26/2014  REFERRING MD :  Dr. Sharyon MedicusHijazi [facility]  CHIEF COMPLAINT:  Acute on chronic respiratory failure  BRIEF PATIENT DESCRIPTION: 73 year old ES-COPD female with failure to thrive (88 lbs) who was intubated multiple times in an outside facility [facility] and [facility]ating.  Family wants full code status including trach/peg.  SIGNIFICANT EVENTS / STUDIES:  5/14 intubated for respiratory failure.  LINES / TUBES: ETT 5/14>>>  [facility]  [facility]   SUBJECTIVE: ett placed  VITAL SIGNS:  [facility].  PHYSICAL EXAMINATION: General:   Chronically ill appearing cachectic female, rass -2 Neuro:  rass -2 HEENT:  PERRL, EOM-I and DMM. Neck:  Supple Cardiovascular:  RRR, Nl S1/S2, -M/R/G. Lungs:  Coarse , worse rt Abdomen:  Soft, NT, ND and +BS. Musculoskeletal:  -edema and -tenderness. Skin:  Sacral decub stage 3   Recent Labs Lab 03/25/14 0616 03/27/14 0500  NA 143 137  K 4.0 3.8  CL 95* 94*  CO2 40* 37*  BUN 21 18  CREATININE 0.47* 0.53  GLUCOSE 160* 98    Recent Labs Lab 03/25/14 0616 03/27/14 0500  HGB 9.8* 8.8*  HCT 32.4* 28.5*  WBC 7.9 16.0*  PLT 171 152   Dg Chest Port 1 View  03/27/2014   CLINICAL DATA:  Intubated  EXAM: PORTABLE CHEST - 1 VIEW  COMPARISON:  Prior chest x-ray 03/26/2014  FINDINGS: The patient is intubated. The tip of the endotracheal tube is 4.3 cm above the carina. Right upper extremity approach PICC, the catheter tip is at the superior cavoatrial junction. The tip of the weighted enteric feeding tube can be identified overlying the stomach. Cardiac and mediastinal contours are unchanged. Persistent right basilar opacity and likely pleural effusion. There has been some interval clearing in the left base. The mid and upper lung fields remained relatively well  aerated. No acute osseous abnormality.  IMPRESSION: 1. Stable and satisfactory support apparatus. 2. Persistent right basilar opacity favored to reflect a combination of pleural fluid with infiltrate and/or atelectasis. 3. Improving left basilar opacity consistent with resolving atelectasis/infiltrate.   Electronically Signed   By: Malachy MoanHeath  McCullough M.D.   On: 03/27/2014 07:47   Dg Chest Port 1 View  03/26/2014   CLINICAL DATA:  Post intubation.  EXAM: PORTABLE CHEST - 1 VIEW  COMPARISON:  03/25/2014.  FINDINGS: Rotation to the right. Endotracheal tube tip 3.2 cm above the carina.  Feeding tube is in place coursing below diaphragm. Tip not imaged on the current exam.  Right central line new is in place. Tip difficult adequately assessed on the current examination secondary to rotation overlying structures.  No gross pneumothorax.  Consolidation lower lung zones may represent elevated hemidiaphragm combined with pleural fluid and atelectasis although infiltrate or mass not excluded. Appearance similar to prior exam.  Mild central pulmonary vascular prominence.  Tortuous aorta.  IMPRESSION: Rotated exam with endotracheal tube tip 3.2 cm above the carina.  Persistent consolidation lung bases.  Please see above   Electronically Signed   By: Bridgett LarssonSteve  Olson M.D.   On: 03/26/2014 11:36   Dg Abd Portable 1v  03/26/2014   CLINICAL DATA:  Feeding tube placement.  EXAM: PORTABLE ABDOMEN - 1 VIEW  COMPARISON:  CT ABD/PELVIS W CM dated 02/01/2014; XR-ABDOMEN-KUB/FLAT PLATE dated 4/09/81194/28/2014  FINDINGS: Feeding tube tip is in the left  mid abdomen, likely in the lower stomach. Atelectasis in the lung bases. Stool-filled colon without bowel distention.  IMPRESSION: Feeding tube tip is in the left mid abdomen consistent with location in the lower stomach.   Electronically Signed   By: Burman Nieves M.D.   On: 03/26/2014 01:28    ASSESSMENT / PLAN:  73 year old female in ES-COPD, severely cachectic, SSH-MD spoke with family  they still want everything done.  Respiratory failure primarily due to severe COPD and deconditioning.  Family has very unrealistic expectations.    Plan: - Intubated, ABG reviewed, reduce to 350 cc TV -consider sbt attempt cpap5 ps 5, goal 2 hr, if fail then increase PS 10, still goal 2 hr only , no longer -lasix per primary, may need reduction, does not appear gross overload -we need to discuss DNI status with family, concerned about trach and lack of benefit with such progressive COPD, malnurished state -steroids remain -pcxr in am -secretions are increased, consider dc levo , add more aggressive pseudomonal coverage, meropenem -continued vanc -mobilize as able, rass goal 0 -if trach sought will plan Monday noon  D/w Dr Sharyon Medicus CC time 30 min.  Mcarthur Rossetti. Tyson Alias, MD, FACP Pgr: [phone] [place]

## 2014-03-27 NOTE — Progress Notes (Signed)
[facility]                                                                                              Progress note     Patient Demographics  Katelyn Rojas, is a 73 y.o. female  [id number]:[id number]  [id number]  DOB - 12/23/1940  Admit date - 03/24/2014  Admitting Physician Carron Curie, MD  Outpatient Primary MD for the patient is [name],[name], MD  LOS - 3   Chief complaint    Respiratory failure    Protein calorie malnutrition         Subjective:   [name] cannot give any history  Objective:   Vital signs  Temperature 98.4 Heart rate 77 Respiratory rate 16 Blood pressure 11 0/53 Pulse ox 98%    Exam Obtunded,  [place].AT, ET tube in place Supple Neck,No JVD, No cervical lymphadenopathy appreciated.  Symmetrical Chest wall movement, decreased breath sounds bilaterally basally,  RRR,No Gallops,Rubs or new Murmurs, No Parasternal Heave +ve B.Sounds, Abd Soft, Non tender, No organomegaly appriciated, No rebound - guarding or rigidity. No Cyanosis, Clubbing or edema, No new Rashes or bruise     I&Os-2630/965 Foley - yes ETT -yes  Data Review   CBC  Recent Labs Lab 03/25/14 0616 03/27/14 0500  WBC 7.9 16.0*  HGB 9.8* 8.8*  HCT 32.4* 28.5*  PLT 171 152  MCV 98.5 97.3  MCH 29.8 30.0  MCHC 30.2 30.9  RDW 13.5 13.5    Chemistries   Recent Labs Lab 03/25/14 0616 03/27/14 0500  NA 143 137  K 4.0 3.8  CL 95* 94*  CO2 40* 37*  GLUCOSE 160* 98  BUN 21 18  CREATININE 0.47* 0.53  CALCIUM 9.0 8.5  MG 2.0  --   AST 17  --   ALT 25  --   ALKPHOS 71  --   BILITOT 0.4  --    ------------------------------------------------------------------------------------------------------------------ CrCl is unknown because both a height and weight (above a minimum accepted value) are required for this  calculation. ------------------------------------------------------------------------------------------------------------------ No results found for this basename: HGBA1C,  in the last 72 hours ------------------------------------------------------------------------------------------------------------------ No results found for this basename: CHOL, HDL, LDLCALC, TRIG, CHOLHDL, LDLDIRECT,  in the last 72 hours ------------------------------------------------------------------------------------------------------------------  Recent Labs  03/25/14 0616  TSH 0.230*   ------------------------------------------------------------------------------------------------------------------ No results found for this basename: VITAMINB12, FOLATE, FERRITIN, TIBC, IRON, RETICCTPCT,  in the last 72 hours  Coagulation profile No results found for this basename: INR, PROTIME,  in the last 168 hours  No results found for this basename: DDIMER,  in the last 72 hours  Cardiac Enzymes No results found for this basename: CK, CKMB, TROPONINI, MYOGLOBIN,  in the last 168 hours ------------------------------------------------------------------------------------------------------------------ No components found with this basename: POCBNP,   Micro Results No results found for this or any previous visit (from the past 240 hour(s)).     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 History of protein calorie malnutrition with failure to thrive Dysphagia NG/OG feeding Hypertension controlled  dementia start Aricept Osteoarthritis  pain control Pneumonia on IV Vanco and meropenem Multiple and chronic thoracic vertebral compression fractures IV fentanyl PR and Generalized weakness PT OT where held today  Plan Change Levaquin to imipenem Increase Lasix to 40 mg IV daily Discussed with the patient and family prior to  intubation, patient wants to have a tracheostomy placed  Code Status: Full      DVT Prophylaxis  heparin   Carron CurieAli Lakeesha Rojas M.D on 03/27/2014 at 1:55 PM

## 2014-03-28 ENCOUNTER — Other Ambulatory Visit (HOSPITAL_COMMUNITY): Payer: Self-pay

## 2014-03-28 ENCOUNTER — Other Ambulatory Visit (HOSPITAL_COMMUNITY): Payer: Self-pay | Admitting: Family Medicine

## 2014-03-28 NOTE — Progress Notes (Signed)
[facility]                                                                                              Progress note     Patient Demographics  Katelyn Rojas, is a 73 y.o. female  [id number]:[id number]  [id number]  DOB - 15-Feb-1941  Admit date - 03/24/2014  Admitting Physician Carron Curie, MD  Outpatient Primary MD for the patient is [name],[name], MD  LOS - 4   Chief complaint    Respiratory failure    Protein calorie malnutrition         Subjective:   Katelyn Rojas cannot give any history  Objective:   Vital signs  Temperature 98.2 Heart rate 69 Respiratory rate 16 Blood pressure 99/54 Pulse ox 96%    Exam Obtunded,  [place].AT, ET tube in place Supple Neck,No JVD, No cervical lymphadenopathy appreciated.  Symmetrical Chest wall movement, decreased breath sounds bilaterally basally,  RRR,No Gallops,Rubs or new Murmurs, No Parasternal Heave +ve B.Sounds, Abd Soft, Non tender, No organomegaly appriciated, No rebound - guarding or rigidity. No Cyanosis, Clubbing or edema, No new Rashes or bruise     I&Os-2920/1300 Foley - yes ETT -yes  Data Review   CBC  Recent Labs Lab 03/25/14 0616 03/27/14 0500  WBC 7.9 16.0*  HGB 9.8* 8.8*  HCT 32.4* 28.5*  PLT 171 152  MCV 98.5 97.3  MCH 29.8 30.0  MCHC 30.2 30.9  RDW 13.5 13.5    Chemistries   Recent Labs Lab 03/25/14 0616 03/27/14 0500  NA 143 137  K 4.0 3.8  CL 95* 94*  CO2 40* 37*  GLUCOSE 160* 98  BUN 21 18  CREATININE 0.47* 0.53  CALCIUM 9.0 8.5  MG 2.0  --   AST 17  --   ALT 25  --   ALKPHOS 71  --   BILITOT 0.4  --    ------------------------------------------------------------------------------------------------------------------ CrCl is unknown because both a height and weight (above a minimum accepted value) are required for this  calculation. ------------------------------------------------------------------------------------------------------------------ No results found for this basename: HGBA1C,  in the last 72 hours ------------------------------------------------------------------------------------------------------------------ No results found for this basename: CHOL, HDL, LDLCALC, TRIG, CHOLHDL, LDLDIRECT,  in the last 72 hours ------------------------------------------------------------------------------------------------------------------ No results found for this basename: TSH, T4TOTAL, FREET3, T3FREE, THYROIDAB,  in the last 72 hours ------------------------------------------------------------------------------------------------------------------ No results found for this basename: VITAMINB12, FOLATE, FERRITIN, TIBC, IRON, RETICCTPCT,  in the last 72 hours  Coagulation profile No results found for this basename: INR, PROTIME,  in the last 168 hours  No results found for this basename: DDIMER,  in the last 72 hours  Cardiac Enzymes No results found for this basename: CK, CKMB, TROPONINI, MYOGLOBIN,  in the last 168 hours ------------------------------------------------------------------------------------------------------------------ No components found with this basename: POCBNP,   Micro Results No results found for this or any previous visit (from the past 240 hour(s)).     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  Code Status: Full      DVT Prophylaxis  heparin   Carron CurieAli Baker Moronta M.D on 03/28/2014 at 12:10 PM

## 2014-03-29 LAB — BASIC METABOLIC PANEL
BUN: 30 mg/dL — ABNORMAL HIGH (ref 6–23)
CHLORIDE: 94 meq/L — AB (ref 96–112)
CO2: 40 meq/L — AB (ref 19–32)
Calcium: 8.4 mg/dL (ref 8.4–10.5)
Creatinine, Ser: 0.62 mg/dL (ref 0.50–1.10)
GFR calc Af Amer: >90 mL/min (ref >90)
GFR, EST NON AFRICAN AMERICAN: 89 mL/min — AB (ref >90)
GLUCOSE: 156 mg/dL — AB (ref 70–99)
Potassium: 3.7 mEq/L (ref 3.7–5.3)
SODIUM: 141 meq/L (ref 137–147)

## 2014-03-29 LAB — CBC
HEMATOCRIT: 27.2 % — AB (ref 36.0–46.0)
HEMOGLOBIN: 8.4 g/dL — AB (ref 12.0–15.0)
MCH: 30.3 pg (ref 26.0–34.0)
MCHC: 30.9 g/dL (ref 30.0–36.0)
MCV: 98.2 fL (ref 78.0–100.0)
Platelets: 138 10*3/uL — ABNORMAL LOW (ref 150–400)
RBC: 2.77 MIL/uL — AB (ref 3.87–5.11)
RDW: 13.6 % (ref 11.5–15.5)
WBC: 14.7 10*3/uL — ABNORMAL HIGH (ref 4.0–10.5)

## 2014-03-29 LAB — VANCOMYCIN, TROUGH: Vancomycin Tr: 20.5 ug/mL — ABNORMAL HIGH (ref 10.0–20.0)

## 2014-03-29 NOTE — Progress Notes (Addendum)
[facility]                                                                                              Progress note     Patient Demographics  Katelyn BruinDelsenia Rojas, is a [age] y.o. female  FAO:[id number]  [id number]  DOB - 11/13/1941  Admit date - 03/24/2014  Admitting Physician Carron CurieAli Shandie Bertz, MD  Outpatient Primary MD for the patient is [name],[name], MD  LOS - 5   Chief complaint    Respiratory failure    Protein calorie malnutrition         Subjective:   Katelyn Rojas cannot give any history  Objective:   Vital signs  Temperature 98.1 Heart rate 73 Respiratory rate 16 Blood pressure 105/52 Pulse ox 96%    Exam Obtunded,  [place].AT, ET tube in place Supple Neck,No JVD, No cervical lymphadenopathy appreciated.  Symmetrical Chest wall movement, decreased breath sounds bilaterally basally,  RRR,No Gallops,Rubs or new Murmurs, No Parasternal Heave +ve B.Sounds, Abd Soft, Non tender, No organomegaly appriciated, No rebound - guarding or rigidity. No Cyanosis, Clubbing or edema, No new Rashes or bruise     I&Os-3280/2800 Foley - yes ETT -yes  Data Review   CBC  Recent Labs Lab 03/25/14 0616 03/27/14 0500 03/29/14 0650  WBC 7.9 16.0* 14.7*  HGB 9.8* 8.8* 8.4*  HCT 32.4* 28.5* 27.2*  PLT 171 152 138*  MCV 98.5 97.3 98.2  MCH 29.8 30.0 30.3  MCHC 30.2 30.9 30.9  RDW 13.5 13.5 13.6    Chemistries   Recent Labs Lab 03/25/14 0616 03/27/14 0500 03/29/14 0650  NA 143 137 141  K 4.0 3.8 3.7  CL 95* 94* 94*  CO2 40* 37* 40*  GLUCOSE 160* 98 156*  BUN 21 18 30*  CREATININE 0.47* 0.53 0.62  CALCIUM 9.0 8.5 8.4  MG 2.0  --   --   AST 17  --   --   ALT 25  --   --   ALKPHOS 71  --   --   BILITOT 0.4  --   --    ------------------------------------------------------------------------------------------------------------------ CrCl is unknown because both a height  and weight (above a minimum accepted value) are required for this calculation. ------------------------------------------------------------------------------------------------------------------ No results found for this basename: HGBA1C,  in the last 72 hours ------------------------------------------------------------------------------------------------------------------ No results found for this basename: CHOL, HDL, LDLCALC, TRIG, CHOLHDL, LDLDIRECT,  in the last 72 hours ------------------------------------------------------------------------------------------------------------------ No results found for this basename: TSH, T4TOTAL, FREET3, T3FREE, THYROIDAB,  in the last 72 hours ------------------------------------------------------------------------------------------------------------------ No results found for this basename: VITAMINB12, FOLATE, FERRITIN, TIBC, IRON, RETICCTPCT,  in the last 72 hours  Coagulation profile No results found for this basename: INR, PROTIME,  in the last 168 hours  No results found for this basename: DDIMER,  in the last 72 hours  Cardiac Enzymes No results found for this basename: CK, CKMB, TROPONINI, MYOGLOBIN,  in the last 168 hours ------------------------------------------------------------------------------------------------------------------ No components found with this basename: POCBNP,   Micro Results No results found for this or any previous visit (from the past 240 hour(s)).     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  Code Status: Full      DVT Prophylaxis  heparin   Carron Curie M.D on 03/29/2014 at 12:05 PM

## 2014-03-30 ENCOUNTER — Encounter (HOSPITAL_COMMUNITY): Payer: Medicare Other

## 2014-03-30 ENCOUNTER — Other Ambulatory Visit (HOSPITAL_COMMUNITY): Payer: Self-pay

## 2014-03-30 DIAGNOSIS — J449 Chronic obstructive pulmonary disease, unspecified: Secondary | ICD-10-CM | POA: Diagnosis not present

## 2014-03-30 DIAGNOSIS — J4489 Other specified chronic obstructive pulmonary disease: Secondary | ICD-10-CM

## 2014-03-30 DIAGNOSIS — J9620 Acute and chronic respiratory failure, unspecified whether with hypoxia or hypercapnia: Secondary | ICD-10-CM | POA: Diagnosis not present

## 2014-03-30 DIAGNOSIS — J189 Pneumonia, unspecified organism: Secondary | ICD-10-CM | POA: Diagnosis not present

## 2014-03-30 LAB — BLOOD GAS, ARTERIAL
Acid-Base Excess: 15.5 mmol/L — ABNORMAL HIGH (ref 0.0–2.0)
Bicarbonate: 40.7 mEq/L — ABNORMAL HIGH (ref 20.0–24.0)
FIO2: 40 %
O2 Saturation: 97.3 %
PATIENT TEMPERATURE: 99.6
PEEP/CPAP: 5 cmH2O
RATE: 10 resp/min
TCO2: 42.5 mmol/L (ref 0–100)
VT: 350 mL
pCO2 arterial: 62.4 mmHg (ref 35.0–45.0)
pH, Arterial: 7.432 (ref 7.350–7.450)
pO2, Arterial: 93 mmHg (ref 80.0–100.0)

## 2014-03-30 NOTE — Progress Notes (Signed)
[facility]                                                                                              Progress note     Patient Demographics  Guerry BruinDelsenia Destin, is a 73 y.o. female  ZOX:[id number]  WJX:[id number]  DOB - 11/08/1941  Admit date - 03/24/2014  Admitting Physician Carron CurieAli Lynnie Koehler, MD  Outpatient Primary MD for the patient is [name],[name], MD  LOS - 6   Chief complaint    Respiratory failure    Protein calorie malnutrition         Subjective:   Lera Goodenow cannot give any history  Objective:   Vital signs  Temperature 98.1 Heart rate 65 Respiratory rate 16 Blood pressure 105/52 Pulse ox 95%    Exam Obtunded,  [place].AT, ET tube in place Supple Neck,No JVD, No cervical lymphadenopathy appreciated.  Symmetrical Chest wall movement, decreased breath sounds bilaterally basally,  RRR,No Gallops,Rubs or new Murmurs, No Parasternal Heave +ve B.Sounds, Abd Soft, Non tender, No organomegaly appriciated, No rebound - guarding or rigidity. No Cyanosis, Clubbing or edema, No new Rashes or bruise     I&Os-3206/2775 Foley - yes ETT -yes  Data Review   CBC  Recent Labs Lab 03/25/14 0616 03/27/14 0500 03/29/14 0650  WBC 7.9 16.0* 14.7*  HGB 9.8* 8.8* 8.4*  HCT 32.4* 28.5* 27.2*  PLT 171 152 138*  MCV 98.5 97.3 98.2  MCH 29.8 30.0 30.3  MCHC 30.2 30.9 30.9  RDW 13.5 13.5 13.6    Chemistries   Recent Labs Lab 03/25/14 0616 03/27/14 0500 03/29/14 0650  NA 143 137 141  K 4.0 3.8 3.7  CL 95* 94* 94*  CO2 40* 37* 40*  GLUCOSE 160* 98 156*  BUN 21 18 30*  CREATININE 0.47* 0.53 0.62  CALCIUM 9.0 8.5 8.4  MG 2.0  --   --   AST 17  --   --   ALT 25  --   --   ALKPHOS 71  --   --   BILITOT 0.4  --   --    ------------------------------------------------------------------------------------------------------------------ CrCl is unknown because both a height  and weight (above a minimum accepted value) are required for this calculation. ------------------------------------------------------------------------------------------------------------------ No results found for this basename: HGBA1C,  in the last 72 hours ------------------------------------------------------------------------------------------------------------------ No results found for this basename: CHOL, HDL, LDLCALC, TRIG, CHOLHDL, LDLDIRECT,  in the last 72 hours ------------------------------------------------------------------------------------------------------------------ No results found for this basename: TSH, T4TOTAL, FREET3, T3FREE, THYROIDAB,  in the last 72 hours ------------------------------------------------------------------------------------------------------------------ No results found for this basename: VITAMINB12, FOLATE, FERRITIN, TIBC, IRON, RETICCTPCT,  in the last 72 hours  Coagulation profile No results found for this basename: INR, PROTIME,  in the last 168 hours  No results found for this basename: DDIMER,  in the last 72 hours  Cardiac Enzymes No results found for this basename: CK, CKMB, TROPONINI, MYOGLOBIN,  in the last 168 hours ------------------------------------------------------------------------------------------------------------------ No components found with this basename: POCBNP,   Micro Results No results found for this or any previous visit (from the past 240 hour(s)).     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 I discussed with them also the issue of comfort care however they refused categorically.  Code Status: Full      DVT Prophylaxis  heparin   Carron Curie M.D on 03/30/2014 at 12:15 PM

## 2014-03-30 NOTE — Consult Note (Signed)
PULMONARY / CRITICAL CARE MEDICINE  Name: Katelyn Rojas MRN: [id number] DOB: 1941-09-18    ADMISSION DATE:  03/24/2014 CONSULTATION DATE:  03/26/2014  REFERRING MD :  Dr. Sharyon Medicus PRIMARY SERVICE:  [facility]  CHIEF COMPLAINT:  Acute on chronic respiratory failure  BRIEF PATIENT DESCRIPTION: 73 year old ES-COPD female with failure to thrive (88 lbs) who was intubated multiple times in an outside facility presenting to [facility] for rehab.  Family wants full code status including trach/peg.  VITAL SIGNS:  Reviewed  PHYSICAL EXAMINATION: General:   Cachectic Neuro:  Sedated  HEENT:  OETT Neck:  No JVD Cardiovascular:  Regular, no murmurs Lungs:  Coarse bilateral air enry Abdomen:  Soft, non tender Musculoskeletal:  No edema Skin:  Sacral decub  LABS: CBC  Recent Labs Lab 03/25/14 0616 03/27/14 0500 03/29/14 0650  WBC 7.9 16.0* 14.7*  HGB 9.8* 8.8* 8.4*  HCT 32.4* 28.5* 27.2*  PLT 171 152 138*   Coag's No results found for this basename: APTT, INR,  in the last 168 hours BMET  Recent Labs Lab 03/25/14 0616 03/27/14 0500 03/29/14 0650  NA 143 137 141  K 4.0 3.8 3.7  CL 95* 94* 94*  CO2 40* 37* 40*  BUN 21 18 30*  CREATININE 0.47* 0.53 0.62  GLUCOSE 160* 98 156*   Electrolytes  Recent Labs Lab 03/25/14 0616 03/27/14 0500 03/29/14 0650  CALCIUM 9.0 8.5 8.4  MG 2.0  --   --   PHOS 2.7  --   --    Sepsis Markers  Recent Labs Lab 03/25/14 0616  PROCALCITON <0.10   ABG  Recent Labs Lab 03/26/14 1230 03/26/14 1355 03/30/14 0524  PHART 7.536* 7.511* 7.432  PCO2ART 45.0 48.3* 62.4*  PO2ART 236.0* 69.1* 93.0   Liver Enzymes  Recent Labs Lab 03/25/14 0616  AST 17  ALT 25  ALKPHOS 71  BILITOT 0.4  ALBUMIN 2.7*   Cardiac Enzymes No results found for this basename: TROPONINI, PROBNP,  in the last 168 hours Glucose No results found for this basename: GLUCAP,  in the last 168 hours  IMAGING:  Dg Chest Port 1 View  03/30/2014   CLINICAL DATA:   Respiratory failure  EXAM: PORTABLE CHEST - 1 VIEW  COMPARISON:  DG CHEST 1V PORT dated 03/28/2014; DG CHEST 1V PORT dated 03/27/2014; DG CHEST 1V PORT dated 03/26/2014  FINDINGS: Grossly unchanged enlarged cardiac silhouette and mediastinal contours given reduced lung volumes and kyphotic projection. Stable positioning of support apparatus. No pneumothorax, though note, evaluation of the right lung apex is excluded due to overlying chin. Suspected minimal increase in small bilateral effusions with associated worsening bibasilar opacities, right greater than left, possibly accentuated due to kyphotic projection. No definite evidence of edema. Grossly unchanged bones including old/healed left-sided posterior lateral rib fractures.  IMPRESSION: 1.  Stable positioning of support apparatus.  No pneumothorax. 2. Minimal increase in bilateral effusions with associated worsening bibasilar opacities, atelectasis versus infiltrate. 3. Hyperexpanded lungs without definite evidence of edema.   Electronically Signed   By: Simonne Come M.D.   On: 03/30/2014 08:13    ASSESSMENT / PLAN:  Acute on chronic respiratory failure End-stage COPD Severe malnutrition / cachexia    Full mechanical support, may remain ventilator dependent in spite of tracheostomy  Tracheostomy  Bronchodilators  Systemic steroids  Antibiotics per primary team  I have personally obtained history, examined patient, evaluated and interpreted laboratory and imaging results, reviewed medical records, formulated assessment / plan and placed orders.  Adriana Reams  Jessikah Dicker, MD Pulmonary and Critical Care Medicine [facility] Pager: [phone]  03/30/2014, 3:35 PM

## 2014-03-31 LAB — BASIC METABOLIC PANEL
BUN: 36 mg/dL — AB (ref 6–23)
CHLORIDE: 95 meq/L — AB (ref 96–112)
CO2: 43 mEq/L (ref 19–32)
CREATININE: 0.61 mg/dL (ref 0.50–1.10)
Calcium: 9 mg/dL (ref 8.4–10.5)
GFR, EST NON AFRICAN AMERICAN: 89 mL/min — AB (ref >90)
Glucose, Bld: 119 mg/dL — ABNORMAL HIGH (ref 70–99)
POTASSIUM: 4.9 meq/L (ref 3.7–5.3)
Sodium: 142 mEq/L (ref 137–147)

## 2014-03-31 LAB — CBC
HEMATOCRIT: 29 % — AB (ref 36.0–46.0)
Hemoglobin: 9 g/dL — ABNORMAL LOW (ref 12.0–15.0)
MCH: 30.5 pg (ref 26.0–34.0)
MCHC: 31 g/dL (ref 30.0–36.0)
MCV: 98.3 fL (ref 78.0–100.0)
PLATELETS: 189 10*3/uL (ref 150–400)
RBC: 2.95 MIL/uL — ABNORMAL LOW (ref 3.87–5.11)
RDW: 13.8 % (ref 11.5–15.5)
WBC: 12.2 10*3/uL — ABNORMAL HIGH (ref 4.0–10.5)

## 2014-03-31 LAB — APTT: aPTT: 32 seconds (ref 24–37)

## 2014-03-31 LAB — PROTIME-INR
INR: 1 (ref 0.00–1.49)
PROTHROMBIN TIME: 13 s (ref 11.6–15.2)

## 2014-04-01 ENCOUNTER — Inpatient Hospital Stay (HOSPITAL_COMMUNITY)
Admission: AD | Admit: 2014-04-01 | Discharge: 2014-04-01 | Disposition: A | Discharge status: Home / Self Care | Payer: Self-pay | Source: Ambulatory Visit | Attending: Internal Medicine | Admitting: Internal Medicine

## 2014-04-01 ENCOUNTER — Other Ambulatory Visit (HOSPITAL_COMMUNITY): Payer: Self-pay

## 2014-04-01 NOTE — Progress Notes (Signed)
PULMONARY / CRITICAL CARE MEDICINE  Name: Katelyn Rojas MRN: [id number] DOB: 04-26-1941    ADMISSION DATE:  03/24/2014 CONSULTATION DATE:  03/26/2014  REFERRING MD :  Dr. Sharyon Medicus PRIMARY SERVICE:  [facility]  CHIEF COMPLAINT:  Acute on chronic respiratory failure  BRIEF PATIENT DESCRIPTION: 73 year old ES-COPD female with failure to thrive (88 lbs) who was intubated multiple times in an outside facility presenting to [facility] for rehab.  Family wants full code status including trach/peg.  VITAL SIGNS:  Reviewed  PHYSICAL EXAMINATION: General:   Cachectic Neuro:  Sedated  HEENT:  OETT Neck:  No JVD Cardiovascular:  Regular, no murmurs Lungs:  Coarse bilateral air enry Abdomen:  Soft, non tender Musculoskeletal:  No edema Skin:  Sacral decub  LABS: CBC  Recent Labs Lab 03/27/14 0500 03/29/14 0650 03/31/14 0620  WBC 16.0* 14.7* 12.2*  HGB 8.8* 8.4* 9.0*  HCT 28.5* 27.2* 29.0*  PLT 152 138* 189   Coag's  Recent Labs Lab 03/31/14 0620  APTT 32  INR 1.00   BMET  Recent Labs Lab 03/27/14 0500 03/29/14 0650 03/31/14 0620  NA 137 141 142  K 3.8 3.7 4.9  CL 94* 94* 95*  CO2 37* 40* 43*  BUN 18 30* 36*  CREATININE 0.53 0.62 0.61  GLUCOSE 98 156* 119*   Electrolytes  Recent Labs Lab 03/27/14 0500 03/29/14 0650 03/31/14 0620  CALCIUM 8.5 8.4 9.0   Sepsis Markers No results found for this basename: LATICACIDVEN, PROCALCITON, O2SATVEN,  in the last 168 hours ABG  Recent Labs Lab 03/26/14 1230 03/26/14 1355 03/30/14 0524  PHART 7.536* 7.511* 7.432  PCO2ART 45.0 48.3* 62.4*  PO2ART 236.0* 69.1* 93.0   Liver Enzymes No results found for this basename: AST, ALT, ALKPHOS, BILITOT, ALBUMIN,  in the last 168 hours Cardiac Enzymes No results found for this basename: TROPONINI, PROBNP,  in the last 168 hours Glucose No results found for this basename: GLUCAP,  in the last 168 hours  IMAGING:  No results found.  ASSESSMENT / PLAN:  Acute on chronic  respiratory failure End-stage COPD Severe malnutrition / cachexia    Full mechanical support, may remain ventilator dependent in spite of tracheostomy.  Tracheostomy to be performed today.  Bronchodilators.  Systemic steroids.  Antibiotics per primary team.  I anticipate that even after placement of the tracheostomy today patient will remain vent dependent.  Attempted to bring up comfort with family with little response.  They have extremely unrealistic expectations.  I recommend proceeding with placement in vent SNF as I see no reasonable chance at liberation from mechanical ventilation due to severe COPD and severe deconditioning.  CC time 35 min.  I have personally obtained history, examined patient, evaluated and interpreted laboratory and imaging results, reviewed medical records, formulated assessment / plan and placed orders.  [name], M.D. [facility] Pulmonary/Critical Care Medicine. Pager: [phone]. After hours pager: [phone].

## 2014-04-01 NOTE — Procedures (Signed)
Percutaneous Tracheostomy Procedure Note  Consent from family, patient positioned, sedated and paralyzed.  Area cleaned, lidocaine/epi injected.  Skin incision made followed by blunt dissection.  Airway entered and visualized bronchoscopically.  Catheter advanced.  Wire placed and visualized.  Airway crushed and dilated.  Size 6 cuffed shiley trach placed and visualized well above the carina.  CXR ordered.  Patient tolerated the procedure well with minimal blood loss.  Placed back on vent.  [name], M.D. [facility] Pulmonary/Critical Care Medicine. Pager: [phone]. After hours pager: [phone].

## 2014-04-01 NOTE — Procedures (Signed)
Bedside Tracheostomy Insertion Procedure Note   Patient Details:   Name: Findlay Draus DOB: [birth year] MRN: [id number]  Procedure: Tracheostomy  Pre Procedure Assessment: ET Tube Size:7.5  ET Tube secured at lip (cm):23  Bite block in place: Yes Breath Sounds: Clear  Post Procedure Assessment: There were no vitals taken for this visit. O2 sats: stable throughout Complications: No apparent complications Patient did tolerate procedure well Tracheostomy Brand:Shiley Tracheostomy Style:Cuffed Tracheostomy Size: 6.0 Tracheostomy Secured YOM:[id number] Tracheostomy Placement Confirmation:Trach cuff visualized and in place and Chest X ray ordered for placement    Leonard Downing 04/01/2014, 3:30 PM

## 2014-04-01 NOTE — Procedures (Signed)
Flexible bronchoscopy performed to assist percutaneous tracheostomy.  No trauma to posterior tracheal wall noted during procedure.  Proper tracheostomy tube placement confirmed by performing bronchoscopy via newly placed tracheostomy tube.  Good hemostasis.  Lonia Farber, MD Pulmonary and Critical Care Medicine [facility] Pager: [phone]

## 2014-04-03 DIAGNOSIS — J189 Pneumonia, unspecified organism: Secondary | ICD-10-CM | POA: Diagnosis not present

## 2014-04-03 DIAGNOSIS — J9620 Acute and chronic respiratory failure, unspecified whether with hypoxia or hypercapnia: Secondary | ICD-10-CM | POA: Diagnosis not present

## 2014-04-03 DIAGNOSIS — J449 Chronic obstructive pulmonary disease, unspecified: Secondary | ICD-10-CM | POA: Diagnosis not present

## 2014-04-03 NOTE — Progress Notes (Signed)
PULMONARY / CRITICAL CARE MEDICINE  Name: Katelyn Rojas MRN: [id number] DOB: 03-09-1941    ADMISSION DATE:  03/24/2014 CONSULTATION DATE:  03/26/2014  REFERRING MD :  Dr. Sharyon Medicus  CHIEF COMPLAINT:  Acute on chronic respiratory failure  BRIEF PATIENT DESCRIPTION: 73 year old ES-COPD female with failure to thrive (88 lbs) who was intubated multiple times in an outside facility presenting to [facility] for rehab.  Family wants full code status including trach/peg.  INTERVAL HISTORY:  Tracheostomy placed.  Fails weaning trials.  VITAL SIGNS:  Reviewed  PHYSICAL EXAMINATION: General:   Cachectic Neuro:  Awake, alert, attempting to communicate HEENT:  Tracheostomy site intact, no hemorrhage Neck:  No JVD Cardiovascular:  Regular, no murmurs Lungs:  Bilateral diminished air entry Abdomen:  Soft, non tender Musculoskeletal:  No edema Skin:  Sacral decub  LABS: CBC  Recent Labs Lab 03/29/14 0650 03/31/14 0620  WBC 14.7* 12.2*  HGB 8.4* 9.0*  HCT 27.2* 29.0*  PLT 138* 189   Coag's  Recent Labs Lab 03/31/14 0620  APTT 32  INR 1.00   BMET  Recent Labs Lab 03/29/14 0650 03/31/14 0620  NA 141 142  K 3.7 4.9  CL 94* 95*  CO2 40* 43*  BUN 30* 36*  CREATININE 0.62 0.61  GLUCOSE 156* 119*   Electrolytes  Recent Labs Lab 03/29/14 0650 03/31/14 0620  CALCIUM 8.4 9.0   Sepsis Markers No results found for this basename: LATICACIDVEN, PROCALCITON, O2SATVEN,  in the last 168 hours ABG  Recent Labs Lab 03/30/14 0524  PHART 7.432  PCO2ART 62.4*  PO2ART 93.0   Liver Enzymes No results found for this basename: AST, ALT, ALKPHOS, BILITOT, ALBUMIN,  in the last 168 hours Cardiac Enzymes No results found for this basename: TROPONINI, PROBNP,  in the last 168 hours Glucose No results found for this basename: GLUCAP,  in the last 168 hours  IMAGING:  Dg Chest Port 1 View  04/01/2014   CLINICAL DATA:  Tracheostomy tube placement.  EXAM: PORTABLE CHEST - 1 VIEW   COMPARISON:  03/30/2014  FINDINGS: The cardiac silhouette, mediastinal and hilar contours are normal and stable. The tracheostomy tube is in good position. No complicating features. The feeding tube is stable. The right PICC line is stable. The lungs are burnt out and difficult to evaluate. Suspect persistent bibasilar atelectasis.  IMPRESSION: Tracheostomy tube in good position without complicating features.   Electronically Signed   By: Loralie Champagne M.D.   On: 04/01/2014 17:37    ASSESSMENT / PLAN:  Acute on chronic respiratory failure End-stage COPD, no evidence of exacerbation Severe malnutrition / cachexia  Tracheostomy status   Full mechanical support  Wean per protocol  Likely requires placement as risk for remaining ventilator dependent in spite of tracheostomy  Bronchodilators.  Taper systemic steroids to off  Antibiotics per primary team.  I have personally obtained history, examined patient, evaluated and interpreted laboratory and imaging results, reviewed medical records, formulated assessment / plan and placed orders.  Lonia Farber, MD Pulmonary and Critical Care Medicine [facility] Pager: [phone]  04/03/2014, 10:07 AM

## 2014-04-04 ENCOUNTER — Other Ambulatory Visit (HOSPITAL_COMMUNITY): Payer: Self-pay

## 2014-04-04 LAB — BASIC METABOLIC PANEL
BUN: 41 mg/dL — ABNORMAL HIGH (ref 6–23)
CALCIUM: 9.3 mg/dL (ref 8.4–10.5)
CO2: 41 mEq/L (ref 19–32)
Chloride: 90 mEq/L — ABNORMAL LOW (ref 96–112)
Creatinine, Ser: 0.65 mg/dL (ref 0.50–1.10)
GFR calc Af Amer: >90 mL/min (ref >90)
GFR, EST NON AFRICAN AMERICAN: 87 mL/min — AB (ref >90)
Glucose, Bld: 89 mg/dL (ref 70–99)
Potassium: 4.7 mEq/L (ref 3.7–5.3)
Sodium: 136 mEq/L — ABNORMAL LOW (ref 137–147)

## 2014-04-04 LAB — CBC
HCT: 29.2 % — ABNORMAL LOW (ref 36.0–46.0)
Hemoglobin: 9.4 g/dL — ABNORMAL LOW (ref 12.0–15.0)
MCH: 31.4 pg (ref 26.0–34.0)
MCHC: 32.2 g/dL (ref 30.0–36.0)
MCV: 97.7 fL (ref 78.0–100.0)
PLATELETS: 228 10*3/uL (ref 150–400)
RBC: 2.99 MIL/uL — ABNORMAL LOW (ref 3.87–5.11)
RDW: 13.9 % (ref 11.5–15.5)
WBC: 14.3 10*3/uL — ABNORMAL HIGH (ref 4.0–10.5)

## 2014-04-05 ENCOUNTER — Other Ambulatory Visit (HOSPITAL_COMMUNITY): Payer: Self-pay

## 2014-04-05 ENCOUNTER — Other Ambulatory Visit: Payer: Self-pay

## 2014-04-05 LAB — BLOOD GAS, ARTERIAL
ACID-BASE EXCESS: 6.5 mmol/L — AB (ref 0.0–2.0)
Acid-Base Excess: 1 mmol/L (ref 0.0–2.0)
BICARBONATE: 32.1 meq/L — AB (ref 20.0–24.0)
Bicarbonate: 27.4 mEq/L — ABNORMAL HIGH (ref 20.0–24.0)
DRAWN BY: 252031
FIO2: 1 %
FIO2: 1 %
LHR: 18 {breaths}/min
MECHVT: 350 mL
O2 SAT: 99.8 %
O2 Saturation: 99.5 %
PCO2 ART: 64.2 mmHg — AB (ref 35.0–45.0)
PEEP: 5 cmH2O
PEEP: 5 cmH2O
PH ART: 7.254 — AB (ref 7.350–7.450)
PO2 ART: 270 mmHg — AB (ref 80.0–100.0)
Patient temperature: 98.6
Patient temperature: 98.6
RATE: 18 resp/min
TCO2: 29.4 mmol/L (ref 0–100)
TCO2: 33.9 mmol/L (ref 0–100)
VT: 350 mL
pCO2 arterial: 60.8 mmHg (ref 35.0–45.0)
pH, Arterial: 7.342 — ABNORMAL LOW (ref 7.350–7.450)
pO2, Arterial: 317 mmHg — ABNORMAL HIGH (ref 80.0–100.0)

## 2014-04-05 LAB — CALCIUM: CALCIUM: 8.6 mg/dL (ref 8.4–10.5)

## 2014-04-05 LAB — CBC
HCT: 23.3 % — ABNORMAL LOW (ref 36.0–46.0)
HEMOGLOBIN: 7 g/dL — AB (ref 12.0–15.0)
MCH: 30.6 pg (ref 26.0–34.0)
MCHC: 30 g/dL (ref 30.0–36.0)
MCV: 101.7 fL — ABNORMAL HIGH (ref 78.0–100.0)
PLATELETS: 150 10*3/uL (ref 150–400)
RBC: 2.29 MIL/uL — AB (ref 3.87–5.11)
RDW: 14.2 % (ref 11.5–15.5)
WBC: 12.7 10*3/uL — AB (ref 4.0–10.5)

## 2014-04-05 LAB — LACTIC ACID, PLASMA: LACTIC ACID, VENOUS: 5.9 mmol/L — AB (ref 0.5–2.2)

## 2014-04-05 LAB — BASIC METABOLIC PANEL
BUN: 28 mg/dL — ABNORMAL HIGH (ref 6–23)
CALCIUM: 5.6 mg/dL — AB (ref 8.4–10.5)
CO2: 19 mEq/L (ref 19–32)
Chloride: 109 mEq/L (ref 96–112)
Creatinine, Ser: 0.46 mg/dL — ABNORMAL LOW (ref 0.50–1.10)
Glucose, Bld: 160 mg/dL — ABNORMAL HIGH (ref 70–99)
POTASSIUM: 2.6 meq/L — AB (ref 3.7–5.3)
SODIUM: 144 meq/L (ref 137–147)

## 2014-04-05 LAB — TROPONIN I

## 2014-04-05 LAB — HEMOGLOBIN AND HEMATOCRIT, BLOOD
HEMATOCRIT: 25.7 % — AB (ref 36.0–46.0)
Hemoglobin: 8.2 g/dL — ABNORMAL LOW (ref 12.0–15.0)

## 2014-04-05 LAB — MAGNESIUM: MAGNESIUM: 1.7 mg/dL (ref 1.5–2.5)

## 2014-04-05 LAB — POTASSIUM: Potassium: 6.7 mEq/L (ref 3.7–5.3)

## 2014-04-05 MED FILL — Medication: Qty: 1 | Status: AC

## 2014-04-06 LAB — BASIC METABOLIC PANEL
BUN: 33 mg/dL — ABNORMAL HIGH (ref 6–23)
CO2: 33 meq/L — AB (ref 19–32)
CREATININE: 0.77 mg/dL (ref 0.50–1.10)
Calcium: 8.5 mg/dL (ref 8.4–10.5)
Chloride: 94 mEq/L — ABNORMAL LOW (ref 96–112)
GFR calc Af Amer: >90 mL/min (ref >90)
GFR calc non Af Amer: 83 mL/min — ABNORMAL LOW (ref >90)
GLUCOSE: 169 mg/dL — AB (ref 70–99)
Potassium: 5.5 mEq/L — ABNORMAL HIGH (ref 3.7–5.3)
Sodium: 133 mEq/L — ABNORMAL LOW (ref 137–147)

## 2014-04-06 LAB — CALCIUM, IONIZED: CALCIUM ION: 1.22 mmol/L (ref 1.13–1.30)

## 2014-04-06 NOTE — Progress Notes (Addendum)
[facility]                                                                                              Progress note     Patient Demographics  Katelyn Rojas, is a 73 y.o. female  NKN:[id number]  FXT:[id number]  DOB - 1941/01/15  Admit date - 03/24/2014  Admitting Physician Carron Curie, MD  Outpatient Primary MD for the patient is [name],[name], MD  LOS - 13   Chief complaint    Respiratory failure    Protein calorie malnutrition         Subjective:   Katelyn Rojas cannot give any history  Objective:   Vital signs  Temperature 99.7 Heart rate 80 Respiratory rate 20 Blood pressure 94/47 Pulse ox 100%    Exam Obtunded,  [name],  Supple Neck,No JVD, No cervical lymphadenopathy appreciated.  Tracheostomy tube in place Symmetrical Chest wall movement, decreased breath sounds bilaterally basally, left-sided chest tube noted RRR,No Gallops,Rubs or new Murmurs, No Parasternal Heave +ve B.Sounds, Abd Soft, Non tender, No organomegaly appriciated, No rebound - guarding or rigidity. No Cyanosis, Clubbing or edema, No new Rashes or bruise     I&Os-2250/658 Foley - yes ETT -yes  Data Review   CBC  Recent Labs Lab 03/31/14 0620 04/04/14 0600 04/05/14 0545 04/05/14 1800  WBC 12.2* 14.3* 12.7*  --   HGB 9.0* 9.4* 7.0* 8.2*  HCT 29.0* 29.2* 23.3* 25.7*  PLT 189 228 150  --   MCV 98.3 97.7 101.7*  --   MCH 30.5 31.4 30.6  --   MCHC 31.0 32.2 30.0  --   RDW 13.8 13.9 14.2  --     Chemistries   Recent Labs Lab 03/31/14 0620 04/04/14 0600 04/05/14 0545 04/05/14 1800 04/06/14 0940  NA 142 136* 144  --  133*  K 4.9 4.7 2.6* 6.7* 5.5*  CL 95* 90* 109  --  94*  CO2 43* 41* 19  --  33*  GLUCOSE 119* 89 160*  --  169*  BUN 36* 41* 28*  --  33*  CREATININE 0.61 0.65 0.46*  --  0.77  CALCIUM 9.0 9.3 5.6* 8.6 8.5  MG  --   --  1.7  --   --     ------------------------------------------------------------------------------------------------------------------ CrCl is unknown because both a height and weight (above a minimum accepted value) are required for this calculation. ------------------------------------------------------------------------------------------------------------------ No results found for this basename: HGBA1C,  in the last 72 hours ------------------------------------------------------------------------------------------------------------------ No results found for this basename: CHOL, HDL, LDLCALC, TRIG, CHOLHDL, LDLDIRECT,  in the last 72 hours ------------------------------------------------------------------------------------------------------------------ No results found for this basename: TSH, T4TOTAL, FREET3, T3FREE, THYROIDAB,  in the last 72 hours ------------------------------------------------------------------------------------------------------------------ No results found for this basename: VITAMINB12, FOLATE, FERRITIN, TIBC, IRON, RETICCTPCT,  in the last 72 hours  Coagulation profile  Recent Labs Lab 03/31/14 0620  INR 1.00    No results found for this basename: DDIMER,  in the last 72 hours  Cardiac Enzymes  Recent Labs Lab 04/05/14 0545  TROPONINI <0.30   ------------------------------------------------------------------------------------------------------------------ No components found with this basename: POCBNP,   Micro  Results No results found for this or any previous visit (from the past 240 hour(s)).     Assessment & Plan   Respiratory failure status post ET tube placement , ventilator dependence continue with PSV trials PEA/cardiac arrest with left pneumothorax status post chest tube placement on the left Chronic sacral decubitus ulcer with MRSA treated History of protein calorie malnutrition with failure to thrive off to treating at this time Dysphagia NG/OG  feeding Hypertension controlled  dementia , advanced on Aricept Osteoarthritis pain control Multiple and chronic thoracic vertebral compression fractures IV fentanyl PR and Generalized weakness PT/OT on hold due to trach  Plan Continue same treatment  Code Status: Full      DVT Prophylaxis  heparin   Carron CurieAli Amaurie Wandel M.D on 04/06/2014 at 1:53 PM

## 2014-04-07 ENCOUNTER — Other Ambulatory Visit (HOSPITAL_COMMUNITY): Payer: Self-pay

## 2014-04-07 DIAGNOSIS — R933 Abnormal findings on diagnostic imaging of other parts of digestive tract: Secondary | ICD-10-CM

## 2014-04-07 DIAGNOSIS — S22009A Unspecified fracture of unspecified thoracic vertebra, initial encounter for closed fracture: Secondary | ICD-10-CM

## 2014-04-07 LAB — BASIC METABOLIC PANEL
BUN: 18 mg/dL (ref 6–23)
BUN: 15 mg/dL (ref 6–23)
CO2: 32 mEq/L (ref 19–32)
CO2: 32 mEq/L (ref 19–32)
CREATININE: 0.64 mg/dL (ref 0.50–1.10)
CREATININE: 0.6 mg/dL (ref 0.50–1.10)
Calcium: 8.3 mg/dL — ABNORMAL LOW (ref 8.4–10.5)
Calcium: 8.5 mg/dL (ref 8.4–10.5)
Chloride: 87 mEq/L — ABNORMAL LOW (ref 96–112)
Chloride: 89 mEq/L — ABNORMAL LOW (ref 96–112)
GFR, EST NON AFRICAN AMERICAN: 88 mL/min — AB (ref >90)
GFR, EST NON AFRICAN AMERICAN: 90 mL/min — AB (ref >90)
GLUCOSE: 159 mg/dL — AB (ref 70–99)
Glucose, Bld: 528 mg/dL — ABNORMAL HIGH (ref 70–99)
Potassium: 4.2 mEq/L (ref 3.7–5.3)
Potassium: 4.2 mEq/L (ref 3.7–5.3)
SODIUM: 127 meq/L — AB (ref 137–147)
Sodium: 129 mEq/L — ABNORMAL LOW (ref 137–147)

## 2014-04-07 LAB — CBC
HCT: 16.5 % — ABNORMAL LOW (ref 36.0–46.0)
HCT: 16.4 % — ABNORMAL LOW (ref 36.0–46.0)
Hemoglobin: 5.4 g/dL — CL (ref 12.0–15.0)
Hemoglobin: 5.4 g/dL — CL (ref 12.0–15.0)
MCH: 31.2 pg (ref 26.0–34.0)
MCH: 31.4 pg (ref 26.0–34.0)
MCHC: 32.7 g/dL (ref 30.0–36.0)
MCHC: 32.9 g/dL (ref 30.0–36.0)
MCV: 95.4 fL (ref 78.0–100.0)
MCV: 95.3 fL (ref 78.0–100.0)
PLATELETS: 107 10*3/uL — AB (ref 150–400)
Platelets: 112 10*3/uL — ABNORMAL LOW (ref 150–400)
RBC: 1.73 MIL/uL — ABNORMAL LOW (ref 3.87–5.11)
RBC: 1.72 MIL/uL — ABNORMAL LOW (ref 3.87–5.11)
RDW: 13.7 % (ref 11.5–15.5)
RDW: 13.4 % (ref 11.5–15.5)
WBC: 10.2 10*3/uL (ref 4.0–10.5)
WBC: 10.2 10*3/uL (ref 4.0–10.5)

## 2014-04-07 LAB — ABO/RH: ABO/RH(D): A POS

## 2014-04-07 LAB — PREPARE RBC (CROSSMATCH)

## 2014-04-07 NOTE — Progress Notes (Signed)
[facility]                                                                                              Progress note     Patient Demographics  Katelyn Rojas, is a 73 y.o. female  UJW:[id number]  [id number]  DOB - 08/27/1941  Admit date - 03/24/2014  Admitting Physician Carron CurieAli Kollins Fenter, MD  Outpatient Primary MD for the patient is [name],[name], MD  LOS - 14   Chief complaint    Respiratory failure    Protein calorie malnutrition         Subjective:   Inaaya Mccay cannot give any history  Objective:   Vital signs  Temperature 99.7 Heart rate 80 Respiratory rate 18 Blood pressure 87/49 Pulse ox 100%    Exam Obtunded,  [place].AT,  Supple Neck,No JVD, No cervical lymphadenopathy appreciated.  Tracheostomy noted in midline Symmetrical Chest wall movement, decreased breath sounds bilaterally basally, left-sided chest tube noted RRR,No Gallops,Rubs or new Murmurs, No Parasternal Heave +ve B.Sounds, Abd Soft, Non tender, No organomegaly appriciated, No rebound - guarding or rigidity. No Cyanosis, Clubbing or edema, No new Rashes or bruise     I&Os-1900/1170 Foley - yes   Data Review   CBC  Recent Labs Lab 04/04/14 0600 04/05/14 0545 04/05/14 1800 04/07/14 0500 04/07/14 1130  WBC 14.3* 12.7*  --  10.2 10.2  HGB 9.4* 7.0* 8.2* 5.4* 5.4*  HCT 29.2* 23.3* 25.7* 16.5* 16.4*  PLT 228 150  --  112* 107*  MCV 97.7 101.7*  --  95.4 95.3  MCH 31.4 30.6  --  31.2 31.4  MCHC 32.2 30.0  --  32.7 32.9  RDW 13.9 14.2  --  13.7 13.4    Chemistries   Recent Labs Lab 04/04/14 0600 04/05/14 0545 04/05/14 1800 04/06/14 0940 04/07/14 0500 04/07/14 1130  NA 136* 144  --  133* 127* 129*  K 4.7 2.6* 6.7* 5.5* 4.2 4.2  CL 90* 109  --  94* 87* 89*  CO2 41* 19  --  33* 32 32  GLUCOSE 89 160*  --  169* 528* 159*  BUN 41* 28*  --  33* 18 15  CREATININE 0.65 0.46*  --  0.77  0.64 0.60  CALCIUM 9.3 5.6* 8.6 8.5 8.3* 8.5  MG  --  1.7  --   --   --   --    ------------------------------------------------------------------------------------------------------------------ CrCl is unknown because both a height and weight (above a minimum accepted value) are required for this calculation. ------------------------------------------------------------------------------------------------------------------ No results found for this basename: HGBA1C,  in the last 72 hours ------------------------------------------------------------------------------------------------------------------ No results found for this basename: CHOL, HDL, LDLCALC, TRIG, CHOLHDL, LDLDIRECT,  in the last 72 hours ------------------------------------------------------------------------------------------------------------------ No results found for this basename: TSH, T4TOTAL, FREET3, T3FREE, THYROIDAB,  in the last 72 hours ------------------------------------------------------------------------------------------------------------------ No results found for this basename: VITAMINB12, FOLATE, FERRITIN, TIBC, IRON, RETICCTPCT,  in the last 72 hours  Coagulation profile No results found for this basename: INR, PROTIME,  in the last 168 hours  No results found for this basename: DDIMER,  in the last 72 hours  Cardiac Enzymes  Recent Labs Lab 04/05/14 0545  TROPONINI <0.30   ------------------------------------------------------------------------------------------------------------------ No components found with this basename: POCBNP,   Micro Results No results found for this or any previous visit (from the past 240 hour(s)).     Assessment & Plan   Respiratory failure status post ET tube placement , ventilator dependence Anemia, severe-blood transfusion PEA/cardiac arrest with left pneumothorax status post chest tube placement on the left Chronic sacral decubitus ulcer with MRSA  treated History of protein calorie malnutrition with failure to thrive off to treating at this time Dysphagia NG/OG feeding Hypertension controlled  dementia , advanced on Aricept Osteoarthritis pain control Multiple and chronic thoracic vertebral compression fractures IV fentanyl PR and Generalized weakness PT/OT on hold due to trach Hyponatremia monitor  Plan Blood transfusion Check labs in a.m. DC heparin  Code Status: Full      DVT Prophylaxis  heparin   Carron Curie M.D on 04/07/2014 at 2:02 PM

## 2014-04-07 NOTE — Progress Notes (Signed)
PULMONARY / CRITICAL CARE MEDICINE  Name: Katelyn Rojas MRN: [id number] DOB: 06/19/1941    ADMISSION DATE:  03/24/2014 CONSULTATION DATE:  03/26/2014  REFERRING MD :  Dr. Sharyon Medicus  CHIEF COMPLAINT:  Acute on chronic respiratory failure  BRIEF PATIENT DESCRIPTION: 73 year old ES-COPD female with failure to thrive (88 lbs) who was intubated multiple times in an outside facility presenting to [facility] for rehab.  Family wants full code status including trach/peg.  INTERVAL HISTORY:  Tracheostomy placed.  Fails weaning trials.  VITAL SIGNS:  Reviewed  PHYSICAL EXAMINATION: General:   Cachectic Neuro:  Awake, alert, attempting to communicate HEENT:  Tracheostomy site intact, no hemorrhage Neck:  No JVD Cardiovascular:  Regular, no murmurs Lungs:  Bilateral diminished air entry. Left CT w/out air leak  Abdomen:  Soft, non tender Musculoskeletal:  No edema Skin:  Sacral decub  LABS: CBC  Recent Labs Lab 04/05/14 0545 04/05/14 1800 04/07/14 0500 04/07/14 1130  WBC 12.7*  --  10.2 10.2  HGB 7.0* 8.2* 5.4* 5.4*  HCT 23.3* 25.7* 16.5* 16.4*  PLT 150  --  112* 107*   Coag's No results found for this basename: APTT, INR,  in the last 168 hours BMET  Recent Labs Lab 04/06/14 0940 04/07/14 0500 04/07/14 1130  NA 133* 127* 129*  K 5.5* 4.2 4.2  CL 94* 87* 89*  CO2 33* 32 32  BUN 33* 18 15  CREATININE 0.77 0.64 0.60  GLUCOSE 169* 528* 159*   Electrolytes  Recent Labs Lab 04/05/14 0545  04/06/14 0940 04/07/14 0500 04/07/14 1130  CALCIUM 5.6*  < > 8.5 8.3* 8.5  MG 1.7  --   --   --   --   < > = values in this interval not displayed. Sepsis Markers  Recent Labs Lab 04/05/14 0545  LATICACIDVEN 5.9*   ABG  Recent Labs Lab 04/05/14 0533 04/05/14 0920  PHART 7.254* 7.342*  PCO2ART 64.2* 60.8*  PO2ART 317.0* 270.0*   Liver Enzymes No results found for this basename: AST, ALT, ALKPHOS, BILITOT, ALBUMIN,  in the last 168 hours Cardiac Enzymes  Recent  Labs Lab 04/05/14 0545  TROPONINI <0.30   Glucose No results found for this basename: GLUCAP,  in the last 168 hours  IMAGING:  Dg Chest Port 1 View  04/07/2014   CLINICAL DATA:  Respiratory failure  EXAM: PORTABLE CHEST - 1 VIEW  COMPARISON:  Apr 05, 2014  FINDINGS: The tracheostomy tube tip is 9.7 cm above the carina. Central catheter tip is in the superior vena cava. Left chest tube has been pulled back but is still within the position left hemi thorax with the side port overlying the left lower lobe pulmonary artery. There is no pneumothorax. There is, however, widespread subcutaneous emphysema bilaterally.  There is mild bibasilar atelectatic change. Elsewhere lungs are clear. Heart size and pulmonary vascularity are normal. There is thoracic levoscoliosis. Bones are osteoporotic.  IMPRESSION: Extensive subcutaneous emphysema. No the edema or consolidation. Tube and catheter positions as described without demonstrable pneumothorax. Bibasilar atelectatic change.   Electronically Signed   By: Bretta Bang M.D.   On: 04/07/2014 08:13  agree diffuse [place] air.   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 Not sure how much is dilutional   Discussion Coded this weekend.  Now on full support and Not weaning, hgb with sig drift. Getting blood. Primary service evaluating blood loss source. No airleak from Left Chest tube and no PTX on film. Not sure if she will come off vent long term or not. Certainly not a decannulation candidate.   Discussion   Full mechanical support  Will re-eval for weaning if hgb and CXR stable 5/27  Anemia does not prevent SBT overall unless active bleeding affecting hemodyamics and Tx to specific HGB will not improve  weaning or  O2 support (blood is AVG old 44 days and does not carry O2 well)  Likely requires Vent SNF placement as risk for remaining ventilator dependent in spite of tracheostomy  Bronchodilators.  Taper systemic steroids to off  Place CT to water seal and repeat CXR 5/27  Antibiotics per primary team.   04/07/2014, 2:47 PM I have fully examined this patient and agree with above findings.      [name], MD, FACP Pgr: [phone] [facility]'

## 2014-04-08 ENCOUNTER — Other Ambulatory Visit (HOSPITAL_COMMUNITY): Payer: Self-pay

## 2014-04-08 DIAGNOSIS — F0390 Unspecified dementia without behavioral disturbance: Secondary | ICD-10-CM

## 2014-04-08 DIAGNOSIS — J9620 Acute and chronic respiratory failure, unspecified whether with hypoxia or hypercapnia: Secondary | ICD-10-CM

## 2014-04-08 LAB — BASIC METABOLIC PANEL
BUN: 13 mg/dL (ref 6–23)
CHLORIDE: 88 meq/L — AB (ref 96–112)
CO2: 32 meq/L (ref 19–32)
Calcium: 8.9 mg/dL (ref 8.4–10.5)
Creatinine, Ser: 0.55 mg/dL (ref 0.50–1.10)
GFR calc non Af Amer: >90 mL/min (ref >90)
Glucose, Bld: 97 mg/dL (ref 70–99)
POTASSIUM: 4.5 meq/L (ref 3.7–5.3)
SODIUM: 128 meq/L — AB (ref 137–147)

## 2014-04-08 LAB — CBC
HCT: 31.8 % — ABNORMAL LOW (ref 36.0–46.0)
Hemoglobin: 10.4 g/dL — ABNORMAL LOW (ref 12.0–15.0)
MCH: 29.1 pg (ref 26.0–34.0)
MCHC: 32.7 g/dL (ref 30.0–36.0)
MCV: 88.8 fL (ref 78.0–100.0)
Platelets: 106 10*3/uL — ABNORMAL LOW (ref 150–400)
RBC: 3.58 MIL/uL — AB (ref 3.87–5.11)
RDW: 16.3 % — AB (ref 11.5–15.5)
WBC: 8.4 10*3/uL (ref 4.0–10.5)

## 2014-04-08 LAB — TYPE AND SCREEN
ABO/RH(D): A POS
Antibody Screen: NEGATIVE
UNIT DIVISION: 0
Unit division: 0

## 2014-04-08 NOTE — Progress Notes (Signed)
PULMONARY / CRITICAL CARE MEDICINE  Name: Guerry BruinDelsenia Nyman MRN: [id number] DOB: 04/12/1941    ADMISSION DATE:  03/24/2014 CONSULTATION DATE:  03/26/2014  REFERRING MD :  Dr. Sharyon MedicusHijazi  CHIEF COMPLAINT:  Acute on chronic respiratory failure  BRIEF PATIENT DESCRIPTION: 73 year old ES-COPD female with failure to thrive (88 lbs) who was intubated multiple times in an outside facility presenting [facility].  Family wants full code status including trach/peg. cetst tube by [facility] for NGT related ptx complication?  INTERVAL HISTORY: Fails weaning trials.  VITAL SIGNS:  Reviewed  PHYSICAL EXAMINATION: General:   Cachectic, severe kyphoscoliosis.  Neuro:  Awake, alert, follows commands, tracks and follows. HEENT:  Tracheostomy site intact, no hemorrhage Neck:  No JVD Cardiovascular:  Regular, no murmurs Lungs:  Bilateral diminished air entry. Left CT w/out air leak on water seal only, serous drainage in tube , no fluctuations in fluid with respirations.  Abdomen:  Soft, non tender Musculoskeletal:  No edema Skin:  Sacral decub with wound vac  LABS: CBC  Recent Labs Lab 04/07/14 0500 04/07/14 1130 04/08/14 0420  WBC 10.2 10.2 8.4  HGB 5.4* 5.4* 10.4*  HCT 16.5* 16.4* 31.8*  PLT 112* 107* 106*   Coag's No results found for this basename: APTT, INR,  in the last 168 hours BMET  Recent Labs Lab 04/07/14 0500 04/07/14 1130 04/08/14 0420  NA 127* 129* 128*  K 4.2 4.2 4.5  CL 87* 89* 88*  CO2 32 32 32  BUN 18 15 13   CREATININE 0.64 0.60 0.55  GLUCOSE 528* 159* 97   Electrolytes  Recent Labs Lab 04/05/14 0545  04/07/14 0500 04/07/14 1130 04/08/14 0420  CALCIUM 5.6*  < > 8.3* 8.5 8.9  MG 1.7  --   --   --   --   < > = values in this interval not displayed. Sepsis Markers  Recent Labs Lab 04/05/14 0545  LATICACIDVEN 5.9*   ABG  Recent Labs Lab 04/05/14 0533 04/05/14 0920  PHART 7.254* 7.342*  PCO2ART 64.2* 60.8*  PO2ART 317.0* 270.0*   Liver  Enzymes No results found for this basename: AST, ALT, ALKPHOS, BILITOT, ALBUMIN,  in the last 168 hours Cardiac Enzymes  Recent Labs Lab 04/05/14 0545  TROPONINI <0.30   Glucose No results found for this basename: GLUCAP,  in the last 168 hours  IMAGING:  Dg Chest Port 1 View  04/07/2014   CLINICAL DATA:  Respiratory failure  EXAM: PORTABLE CHEST - 1 VIEW  COMPARISON:  Apr 05, 2014  FINDINGS: The tracheostomy tube tip is 9.7 cm above the carina. Central catheter tip is in the superior vena cava. Left chest tube has been pulled back but is still within the position left hemi thorax with the side port overlying the left lower lobe pulmonary artery. There is no pneumothorax. There is, however, widespread subcutaneous emphysema bilaterally.  There is mild bibasilar atelectatic change. Elsewhere lungs are clear. Heart size and pulmonary vascularity are normal. There is thoracic levoscoliosis. Bones are osteoporotic.  IMPRESSION: Extensive subcutaneous emphysema. No the edema or consolidation. Tube and catheter positions as described without demonstrable pneumothorax. Bibasilar atelectatic change.   Electronically Signed   By: Bretta BangWilliam  Woodruff M.D.   On: 04/07/2014 08:13   5/27 Left chest tube and trach well positioned. No pnx noted. 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 Not sure how much is dilutional   Discussion Prognosis not good  Discussion   Full mechanical support  Wean on ps 12-15, after 1 h reduce pS as able No ptx noted on water seal x 2 r ,will consider dc chest tube,no leak, no ptx on pcxxr  Then after CT dc repeat pcxin 4 and in am  Continued attempts PS weaning this may be medically ineffective theapy and cause sufferig,need  rediscu gols  I have fully examined this patient and agree with above findings.    And edited infull  Mcarthur Rossetti. Tyson Alias, MD, FACP Pgr: [phone] [facility]   .

## 2014-04-08 NOTE — Progress Notes (Signed)
[facility]                                                                                              Progress note     Patient Demographics  Katelyn Rojas, is a 73 y.o. female  YNW:[id number]  [id number]:[id number]  DOB - Jul 30, 1941  Admit date - 03/24/2014  Admitting Physician Carron Curie, MD  Outpatient Primary MD for the patient is [name],[name], MD  LOS - 15   Chief complaint    Respiratory failure    Protein calorie malnutrition         Subjective:   Katelyn Rojas cannot give any history  Objective:   Vital signs  Temperature 98.5 Heart rate 80 Respiratory rate 26 Blood pressure 141/73 Pulse ox 99 %    Exam Obtunded,  [place].AT,  Supple Neck,No JVD, No cervical lymphadenopathy appreciated.  Tracheostomy noted in midline Symmetrical Chest wall movement, decreased breath sounds bilaterally basally, left-sided chest tube noted RRR,No Gallops,Rubs or new Murmurs, No Parasternal Heave +ve B.Sounds, Abd Soft, Non tender, No organomegaly appriciated, No rebound - guarding or rigidity. No Cyanosis, Clubbing or edema, No new Rashes or bruise     I&Os-2365/875 Foley - yes   Data Review   CBC  Recent Labs Lab 04/04/14 0600 04/05/14 0545 04/05/14 1800 04/07/14 0500 04/07/14 1130 04/08/14 0420  WBC 14.3* 12.7*  --  10.2 10.2 8.4  HGB 9.4* 7.0* 8.2* 5.4* 5.4* 10.4*  HCT 29.2* 23.3* 25.7* 16.5* 16.4* 31.8*  PLT 228 150  --  112* 107* 106*  MCV 97.7 101.7*  --  95.4 95.3 88.8  MCH 31.4 30.6  --  31.2 31.4 29.1  MCHC 32.2 30.0  --  32.7 32.9 32.7  RDW 13.9 14.2  --  13.7 13.4 16.3*    Chemistries   Recent Labs Lab 04/05/14 0545 04/05/14 1800 04/06/14 0940 04/07/14 0500 04/07/14 1130 04/08/14 0420  NA 144  --  133* 127* 129* 128*  K 2.6* 6.7* 5.5* 4.2 4.2 4.5  CL 109  --  94* 87* 89* 88*  CO2 19  --  33* 32 32 32  GLUCOSE 160*  --  169* 528* 159* 97  BUN  28*  --  33* 18 15 13   CREATININE 0.46*  --  0.77 0.64 0.60 0.55  CALCIUM 5.6* 8.6 8.5 8.3* 8.5 8.9  MG 1.7  --   --   --   --   --    ------------------------------------------------------------------------------------------------------------------ CrCl is unknown because both a height and weight (above a minimum accepted value) are required for this calculation. ------------------------------------------------------------------------------------------------------------------ No results found for this basename: HGBA1C,  in the last 72 hours ------------------------------------------------------------------------------------------------------------------ No results found for this basename: CHOL, HDL, LDLCALC, TRIG, CHOLHDL, LDLDIRECT,  in the last 72 hours ------------------------------------------------------------------------------------------------------------------ No results found for this basename: TSH, T4TOTAL, FREET3, T3FREE, THYROIDAB,  in the last 72 hours ------------------------------------------------------------------------------------------------------------------ No results found for this basename: VITAMINB12, FOLATE, FERRITIN, TIBC, IRON, RETICCTPCT,  in the last 72 hours  Coagulation profile No results found for this basename: INR, PROTIME,  in the last 168 hours  No results  found for this basename: DDIMER,  in the last 72 hours  Cardiac Enzymes  Recent Labs Lab 04/05/14 0545  TROPONINI <0.30   ------------------------------------------------------------------------------------------------------------------ No components found with this basename: POCBNP,   Micro Results No results found for this or any previous visit (from the past 240 hour(s)).     Assessment & Plan   Respiratory failure status post ET tube placement , ventilator dependence Anemia, severe-blood transfusion PEA/cardiac arrest with left pneumothorax status post chest tube placement on the  left Chronic sacral decubitus ulcer with MRSA treated History of protein calorie malnutrition with failure to thrive off to treating at this time Dysphagia NG/OG feeding Hypertension controlled  dementia , advanced on Aricept Osteoarthritis pain control Multiple and chronic thoracic vertebral compression fractures IV fentanyl PR and Generalized weakness PT/OT on hold due to trach Hyponatremia monitor Thrombocytopenia  Plan Change Pepcid back to Protonix Keep holding heparin GT is still pending Check labs portable chest x-ray in a.m. Code Status: Full      DVT Prophylaxis  heparin   Carron CurieAli Shahzain Kiester M.D on 04/08/2014 at 1:24 PM

## 2014-04-09 ENCOUNTER — Other Ambulatory Visit (HOSPITAL_COMMUNITY): Payer: Self-pay

## 2014-04-09 DIAGNOSIS — J9610 Chronic respiratory failure, unspecified whether with hypoxia or hypercapnia: Secondary | ICD-10-CM | POA: Diagnosis not present

## 2014-04-09 DIAGNOSIS — R933 Abnormal findings on diagnostic imaging of other parts of digestive tract: Secondary | ICD-10-CM | POA: Diagnosis not present

## 2014-04-09 DIAGNOSIS — J9600 Acute respiratory failure, unspecified whether with hypoxia or hypercapnia: Secondary | ICD-10-CM | POA: Diagnosis not present

## 2014-04-09 LAB — BASIC METABOLIC PANEL
BUN: 7 mg/dL (ref 6–23)
CHLORIDE: 91 meq/L — AB (ref 96–112)
CO2: 32 mEq/L (ref 19–32)
Calcium: 8.8 mg/dL (ref 8.4–10.5)
Creatinine, Ser: 0.5 mg/dL (ref 0.50–1.10)
GFR calc non Af Amer: >90 mL/min (ref >90)
Glucose, Bld: 119 mg/dL — ABNORMAL HIGH (ref 70–99)
POTASSIUM: 3.2 meq/L — AB (ref 3.7–5.3)
Sodium: 133 mEq/L — ABNORMAL LOW (ref 137–147)

## 2014-04-09 LAB — CBC
HCT: 29.2 % — ABNORMAL LOW (ref 36.0–46.0)
HEMOGLOBIN: 9.7 g/dL — AB (ref 12.0–15.0)
MCH: 29.7 pg (ref 26.0–34.0)
MCHC: 33.2 g/dL (ref 30.0–36.0)
MCV: 89.3 fL (ref 78.0–100.0)
Platelets: 116 10*3/uL — ABNORMAL LOW (ref 150–400)
RBC: 3.27 MIL/uL — ABNORMAL LOW (ref 3.87–5.11)
RDW: 15.4 % (ref 11.5–15.5)
WBC: 5.5 10*3/uL (ref 4.0–10.5)

## 2014-04-09 NOTE — Progress Notes (Signed)
[facility]                                                                                              Progress note     Patient Demographics  Katelyn Rojas, is a 73 y.o. female  ZOX:[id number]  WJX:[id number]  DOB - 09/05/1941  Admit date - 03/24/2014  Admitting Physician Carron CurieAli Teja Costen, MD  Outpatient Primary MD for the patient is [name],[name], MD  LOS - 16   Chief complaint    Respiratory failure    Protein calorie malnutrition         Subjective:   Katelyn Rojas cannot give any history ; chest tube to be removed today  Objective:   Vital signs  Temperature 98.3 Heart rate 65 Respiratory rate 18 Blood pressure 105/58 Pulse ox 99 %    Exam Obtunded,  [place].AT,  Supple Neck,No JVD, No cervical lymphadenopathy appreciated.  Tracheostomy noted in midline Symmetrical Chest wall movement, decreased breath sounds bilaterally basally, left-sided chest tube noted RRR,No Gallops,Rubs or new Murmurs, No Parasternal Heave +ve B.Sounds, Abd Soft, Non tender, No organomegaly appriciated, No rebound - guarding or rigidity. No Cyanosis, Clubbing or edema, No new Rashes or bruise     I&Os-1525/1770 Foley - yes   Data Review   CBC  Recent Labs Lab 04/05/14 0545 04/05/14 1800 04/07/14 0500 04/07/14 1130 04/08/14 0420 04/09/14 0610  WBC 12.7*  --  10.2 10.2 8.4 5.5  HGB 7.0* 8.2* 5.4* 5.4* 10.4* 9.7*  HCT 23.3* 25.7* 16.5* 16.4* 31.8* 29.2*  PLT 150  --  112* 107* 106* 116*  MCV 101.7*  --  95.4 95.3 88.8 89.3  MCH 30.6  --  31.2 31.4 29.1 29.7  MCHC 30.0  --  32.7 32.9 32.7 33.2  RDW 14.2  --  13.7 13.4 16.3* 15.4    Chemistries   Recent Labs Lab 04/05/14 0545  04/06/14 0940 04/07/14 0500 04/07/14 1130 04/08/14 0420 04/09/14 0610  NA 144  --  133* 127* 129* 128* 133*  K 2.6*  < > 5.5* 4.2 4.2 4.5 3.2*  CL 109  --  94* 87* 89* 88* 91*  CO2 19  --  33* 32 32  32 32  GLUCOSE 160*  --  169* 528* 159* 97 119*  BUN 28*  --  33* 18 15 13 7   CREATININE 0.46*  --  0.77 0.64 0.60 0.55 0.50  CALCIUM 5.6*  < > 8.5 8.3* 8.5 8.9 8.8  MG 1.7  --   --   --   --   --   --   < > = values in this interval not displayed. ------------------------------------------------------------------------------------------------------------------ CrCl is unknown because both a height and weight (above a minimum accepted value) are required for this calculation. ------------------------------------------------------------------------------------------------------------------ No results found for this basename: HGBA1C,  in the last 72 hours ------------------------------------------------------------------------------------------------------------------ No results found for this basename: CHOL, HDL, LDLCALC, TRIG, CHOLHDL, LDLDIRECT,  in the last 72 hours ------------------------------------------------------------------------------------------------------------------ No results found for this basename: TSH, T4TOTAL, FREET3, T3FREE, THYROIDAB,  in the last 72 hours ------------------------------------------------------------------------------------------------------------------ No results found for this basename: VITAMINB12,  FOLATE, FERRITIN, TIBC, IRON, RETICCTPCT,  in the last 72 hours  Coagulation profile No results found for this basename: INR, PROTIME,  in the last 168 hours  No results found for this basename: DDIMER,  in the last 72 hours  Cardiac Enzymes  Recent Labs Lab 04/05/14 0545  TROPONINI <0.30   ------------------------------------------------------------------------------------------------------------------ No components found with this basename: POCBNP,   Micro Results No results found for this or any previous visit (from the past 240 hour(s)).     Assessment & Plan   Respiratory failure status post ET tube placement , ventilator  dependence Anemia, severe-blood transfusion PEA/cardiac arrest with left pneumothorax status post chest tube placement on the left Chronic sacral decubitus ulcer with MRSA treated History of protein calorie malnutrition with failure to thrive off to treating at this time Dysphagia NG/OG feeding Hypertension controlled  dementia , advanced on Aricept Osteoarthritis pain control Multiple and chronic thoracic vertebral compression fractures IV fentanyl PR and Generalized weakness PT/OT on hold due to trach Hyponatremia monitor Thrombocytopenia  Plan DC chest tube Follow chest x-ray after removal Change meropenem to cefepime  Code Status: Full  DVT Prophylaxis  heparin   Carron Curie M.D on 04/09/2014 at 11:43 AM

## 2014-04-09 NOTE — Progress Notes (Signed)
PULMONARY / CRITICAL CARE MEDICINE  Name: Guerry BruinDelsenia Noviello MRN: [id number] DOB: 03/27/1941    ADMISSION DATE:  03/24/2014 CONSULTATION DATE:  03/26/2014  REFERRING MD :  Dr. Sharyon MedicusHijazi  CHIEF COMPLAINT:  Acute on chronic respiratory failure  BRIEF PATIENT DESCRIPTION: 73 year old ES-COPD female with failure to thrive (88 lbs) who was intubated multiple times in an outside facility [facility].  Family wants full code status including trach/peg. cetst tube by [facility] for NGT related ptx complication?  INTERVAL HISTORY: Fails weaning trials.  VITAL SIGNS:Vital signs reviewed. Abnormal values will appear under impression plan section.    PHYSICAL EXAMINATION: General:   Cachectic, severe kyphoscoliosis.  Neuro:  Awake, alert, follows commands, tracks and follows at times. HEENT:  Tracheostomy site intact, no hemorrhage Neck:  No JVD Cardiovascular:  Regular, no murmurs Lungs:  Bilateral diminished air entry. Left CT site dressing intact .  Abdomen:  Soft, non tender Musculoskeletal:  No edema Skin:  Sacral decub with wound vac  LABS: CBC  Recent Labs Lab 04/07/14 1130 04/08/14 0420 04/09/14 0610  WBC 10.2 8.4 5.5  HGB 5.4* 10.4* 9.7*  HCT 16.4* 31.8* 29.2*  PLT 107* 106* 116*   Coag's No results found for this basename: APTT, INR,  in the last 168 hours BMET  Recent Labs Lab 04/07/14 1130 04/08/14 0420 04/09/14 0610  NA 129* 128* 133*  K 4.2 4.5 3.2*  CL 89* 88* 91*  CO2 32 32 32  BUN 15 13 7   CREATININE 0.60 0.55 0.50  GLUCOSE 159* 97 119*   Electrolytes  Recent Labs Lab 04/05/14 0545  04/07/14 1130 04/08/14 0420 04/09/14 0610  CALCIUM 5.6*  < > 8.5 8.9 8.8  MG 1.7  --   --   --   --   < > = values in this interval not displayed. Sepsis Markers  Recent Labs Lab 04/05/14 0545  LATICACIDVEN 5.9*   ABG  Recent Labs Lab 04/05/14 0533 04/05/14 0920  PHART 7.254* 7.342*  PCO2ART 64.2* 60.8*  PO2ART 317.0* 270.0*   Liver  Enzymes No results found for this basename: AST, ALT, ALKPHOS, BILITOT, ALBUMIN,  in the last 168 hours Cardiac Enzymes  Recent Labs Lab 04/05/14 0545  TROPONINI <0.30   Glucose No results found for this basename: GLUCAP,  in the last 168 hours  IMAGING:  Dg Chest Port 1 View  04/09/2014   CLINICAL DATA:  Follow-up pulmonary treatment  EXAM: PORTABLE CHEST - 1 VIEW  COMPARISON:  04/08/2014, 04/07/2014  FINDINGS: Tracheostomy tube in satisfactory position. Left lower lung chest tube unchanged in position. No pneumothorax. Right lower lung linear airspace disease likely reflecting discoid atelectasis. Mild chronic bilateral bronchitic changes. There is no focal parenchymal opacity, pleural effusion, or pneumothorax. The heart and mediastinal contours are unremarkable. Extensive chest wall emphysema bilaterally.  The osseous structures are unremarkable.  IMPRESSION: 1. Left-sided chest tube in unchanged position without a pneumothorax. Bilateral subcutaneous emphysema involving the chest wall. 2. Right basilar atelectasis.   Electronically Signed   By: Elige KoHetal  Patel   On: 04/09/2014 08:10   Dg Chest Port 1 View  04/08/2014   CLINICAL DATA:  Respiratory failure  EXAM: PORTABLE CHEST - 1 VIEW  COMPARISON:  Apr 07, 2014  FINDINGS: The chest tube position on the left is unchanged. Central catheter tip is at the cavoatrial junction. There is no pneumothorax. There is, however, extensive subcutaneous emphysema bilaterally.  There is mild bibasilar atelectasis, stable. There is no  new opacity. Heart size and pulmonary vascularity are normal. Tracheostomy remains well seated.  IMPRESSION: Extensive subcutaneous emphysema again noted. No apparent pneumothorax. Lungs are clear except for mild bibasilar atelectasis, stable. No new opacity. No appreciable change in tube and catheter positions.   Electronically Signed   By: Bretta Bang M.D.   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 Not sure how much is dilutional   Discussion Prognosis not good  Discussion   Wean on ps 10-12, after 1 h reduce pS as able             Chest tube removed on 5/28, without ptx still some residual air sub q   Would repeat pcxr in am as well for ptx and rt base infiltrates Continued attempts PS weaning. May be medically ineffective theapy and cause suffering ,need goals of life addressed.  Brett Canales Minor ACNP [facility] Pager [phone] till 3 pm If no answer page [phone] 04/09/2014, 1:37 PM  I have fully examined this patient and agree with above findings.    And edite dinfull  Mcarthur Rossetti. Tyson Alias, MD, FACP Pgr: [phone] [facility]

## 2014-04-10 ENCOUNTER — Other Ambulatory Visit (HOSPITAL_COMMUNITY): Payer: Self-pay

## 2014-04-10 LAB — BASIC METABOLIC PANEL
BUN: 8 mg/dL (ref 6–23)
CHLORIDE: 92 meq/L — AB (ref 96–112)
CO2: 33 mEq/L — ABNORMAL HIGH (ref 19–32)
CREATININE: 0.46 mg/dL — AB (ref 0.50–1.10)
Calcium: 8.8 mg/dL (ref 8.4–10.5)
GFR calc Af Amer: >90 mL/min (ref >90)
GFR calc non Af Amer: >90 mL/min (ref >90)
GLUCOSE: 154 mg/dL — AB (ref 70–99)
POTASSIUM: 3.5 meq/L — AB (ref 3.7–5.3)
Sodium: 134 mEq/L — ABNORMAL LOW (ref 137–147)

## 2014-04-10 LAB — CBC
HCT: 28.5 % — ABNORMAL LOW (ref 36.0–46.0)
HEMOGLOBIN: 9.5 g/dL — AB (ref 12.0–15.0)
MCH: 29.9 pg (ref 26.0–34.0)
MCHC: 33.3 g/dL (ref 30.0–36.0)
MCV: 89.6 fL (ref 78.0–100.0)
Platelets: 121 10*3/uL — ABNORMAL LOW (ref 150–400)
RBC: 3.18 MIL/uL — ABNORMAL LOW (ref 3.87–5.11)
RDW: 14.8 % (ref 11.5–15.5)
WBC: 5.4 10*3/uL (ref 4.0–10.5)

## 2014-04-10 NOTE — Progress Notes (Addendum)
[facility]                                                                                              Progress note     Patient Demographics  Katelyn Rojas, is a 73 y.o. female  GFQ:[id number]  [id number]  DOB - 05/23/1941  Admit date - 03/24/2014  Admitting Physician Katelyn Curie, MD  Outpatient Primary MD for the patient is [name],[name], MD  LOS - 17   Chief complaint    Respiratory failure    Protein calorie malnutrition         Subjective:   [name] cannot give any history ; chest tube to be removed today  Objective:   Vital signs  Temperature 98.3 Heart rate 64 Respiratory rate 22 Blood pressure 106/68 Pulse ox 100%    Exam Obtunded,  [place].AT,  Supple Neck,No JVD, No cervical lymphadenopathy appreciated.  Tracheostomy noted in midline Symmetrical Chest wall movement, decreased breath sounds bilaterally basally,  RRR,No Gallops,Rubs or new Murmurs, No Parasternal Heave +ve B.Sounds, Abd Soft, Non tender, No organomegaly appriciated, No rebound - guarding or rigidity. No Cyanosis, Clubbing or edema, No new Rashes or bruise     I&Os-1980/1900 Foley - yes   Data Review   CBC  Recent Labs Lab 04/07/14 0500 04/07/14 1130 04/08/14 0420 04/09/14 0610 04/10/14 0510  WBC 10.2 10.2 8.4 5.5 5.4  HGB 5.4* 5.4* 10.4* 9.7* 9.5*  HCT 16.5* 16.4* 31.8* 29.2* 28.5*  PLT 112* 107* 106* 116* 121*  MCV 95.4 95.3 88.8 89.3 89.6  MCH 31.2 31.4 29.1 29.7 29.9  MCHC 32.7 32.9 32.7 33.2 33.3  RDW 13.7 13.4 16.3* 15.4 14.8    Chemistries   Recent Labs Lab 04/05/14 0545  04/07/14 0500 04/07/14 1130 04/08/14 0420 04/09/14 0610 04/10/14 0510  NA 144  < > 127* 129* 128* 133* 134*  K 2.6*  < > 4.2 4.2 4.5 3.2* 3.5*  CL 109  < > 87* 89* 88* 91* 92*  CO2 19  < > 32 32 32 32 33*  GLUCOSE 160*  < > 528* 159* 97 119* 154*  BUN 28*  < > 18 15 13 7 8    CREATININE 0.46*  < > 0.64 0.60 0.55 0.50 0.46*  CALCIUM 5.6*  < > 8.3* 8.5 8.9 8.8 8.8  MG 1.7  --   --   --   --   --   --   < > = values in this interval not displayed. ------------------------------------------------------------------------------------------------------------------ CrCl is unknown because both a height and weight (above a minimum accepted value) are required for this calculation. ------------------------------------------------------------------------------------------------------------------ No results found for this basename: HGBA1C,  in the last 72 hours ------------------------------------------------------------------------------------------------------------------ No results found for this basename: CHOL, HDL, LDLCALC, TRIG, CHOLHDL, LDLDIRECT,  in the last 72 hours ------------------------------------------------------------------------------------------------------------------ No results found for this basename: TSH, T4TOTAL, FREET3, T3FREE, THYROIDAB,  in the last 72 hours ------------------------------------------------------------------------------------------------------------------ No results found for this basename: VITAMINB12, FOLATE, FERRITIN, TIBC, IRON, RETICCTPCT,  in the last 72 hours  Coagulation profile No results found for this basename: INR, PROTIME,  in the last  168 hours  No results found for this basename: DDIMER,  in the last 72 hours  Cardiac Enzymes  Recent Labs Lab 04/05/14 0545  TROPONINI <0.30   ------------------------------------------------------------------------------------------------------------------ No components found with this basename: POCBNP,   Micro Results No results found for this or any previous visit (from the past 240 hour(s)).     Assessment & Plan   Respiratory failure status post ET tube placement , ventilator dependence ,tolerating PSV trials Anemia, severe-blood transfusion PEA/cardiac arrest with  left pneumothorax status post chest tube placement on the left Chronic sacral decubitus ulcer with MRSA treated History of protein calorie malnutrition with failure to thrive off to treating at this time Dysphagia NG/OG feeding Hypertension controlled  dementia , advanced on Aricept Osteoarthritis pain control Multiple and chronic thoracic vertebral compression fractures IV fentanyl PR and Generalized weakness PT/OT on hold due to trach Hyponatremia monitor Thrombocytopenia  Plan Continue IV cefepime Continue PSV trials  Code Status: Full  DVT Prophylaxis  heparin   Katelyn CurieAli Embrie Rojas M.D on 04/10/2014 at 3:16 PM

## 2014-04-11 NOTE — Progress Notes (Addendum)
[facility]                                                                                              Progress note     Patient Demographics  Katelyn Rojas, is a 73 y.o. female  [id number]  [id number]  DOB - Jul 25, 1941  Admit date - 03/24/2014  Admitting Physician Carron Curie, MD  Outpatient Primary MD for the patient is [name],[name], MD  LOS - 18   Chief complaint    Respiratory failure    Protein calorie malnutrition         Subjective:   Sherese Castello cannot give any history ; chest tube to be removed today  Objective:   Vital signs  Temperature 98.8 Heart rate 71 Respiratory rate 16 Blood pressure 106/60 Pulse ox 100%    Exam Obtunded,  .AT,  Supple Neck,No JVD, No cervical lymphadenopathy appreciated.  Tracheostomy noted in midline Symmetrical Chest wall movement, decreased breath sounds bilaterally basally, RRR,No Gallops,Rubs or new Murmurs, No Parasternal Heave +ve B.Sounds, Abd Soft, Non tender, No organomegaly appriciated, No rebound - guarding or rigidity. No Cyanosis, Clubbing or edema, No new Rashes or bruise     I&Os-2000/2550 Foley - yes   Data Review   CBC  Recent Labs Lab 04/07/14 0500 04/07/14 1130 04/08/14 0420 04/09/14 0610 04/10/14 0510  WBC 10.2 10.2 8.4 5.5 5.4  HGB 5.4* 5.4* 10.4* 9.7* 9.5*  HCT 16.5* 16.4* 31.8* 29.2* 28.5*  PLT 112* 107* 106* 116* 121*  MCV 95.4 95.3 88.8 89.3 89.6  MCH 31.2 31.4 29.1 29.7 29.9  MCHC 32.7 32.9 32.7 33.2 33.3  RDW 13.7 13.4 16.3* 15.4 14.8    Chemistries   Recent Labs Lab 04/05/14 0545  04/07/14 0500 04/07/14 1130 04/08/14 0420 04/09/14 0610 04/10/14 0510  NA 144  < > 127* 129* 128* 133* 134*  K 2.6*  < > 4.2 4.2 4.5 3.2* 3.5*  CL 109  < > 87* 89* 88* 91* 92*  CO2 19  < > 32 32 32 32 33*  GLUCOSE 160*  < > 528* 159* 97 119* 154*  BUN 28*  < > 18 15 13 7 8    CREATININE 0.46*  < > 0.64 0.60 0.55 0.50 0.46*  CALCIUM 5.6*  < > 8.3* 8.5 8.9 8.8 8.8  MG 1.7  --   --   --   --   --   --   < > = values in this interval not displayed. ------------------------------------------------------------------------------------------------------------------ CrCl is unknown because both a height and weight (above a minimum accepted value) are required for this calculation. ------------------------------------------------------------------------------------------------------------------ No results found for this basename: HGBA1C,  in the last 72 hours ------------------------------------------------------------------------------------------------------------------ No results found for this basename: CHOL, HDL, LDLCALC, TRIG, CHOLHDL, LDLDIRECT,  in the last 72 hours ------------------------------------------------------------------------------------------------------------------ No results found for this basename: TSH, T4TOTAL, FREET3, T3FREE, THYROIDAB,  in the last 72 hours ------------------------------------------------------------------------------------------------------------------ No results found for this basename: VITAMINB12, FOLATE, FERRITIN, TIBC, IRON, RETICCTPCT,  in the last 72 hours  Coagulation profile No results found for this basename: INR, PROTIME,  in the last 168  hours  No results found for this basename: DDIMER,  in the last 72 hours  Cardiac Enzymes  Recent Labs Lab 04/05/14 0545  TROPONINI <0.30   ------------------------------------------------------------------------------------------------------------------ No components found with this basename: POCBNP,   Micro Results No results found for this or any previous visit (from the past 240 hour(s)).     Assessment & Plan   Respiratory failure status post ET tube placement , ventilator dependence ,tolerating PSV trials Anemia, severe-status post blood transfusion PEA/cardiac  arrest with left pneumothorax status post chest tube placement on the left, removed Chronic sacral decubitus ulcer with MRSA treated History of protein calorie malnutrition with failure to thrive off to treating at this time Dysphagia NG/OG feeding Hypertension controlled  dementia , advanced on Aricept Osteoarthritis pain control Multiple and chronic thoracic vertebral compression fractures IV fentanyl PR and Generalized weakness PT/OT on hold due to trach Hyponatremia monitor Thrombocytopenia  Plan Continue IV cefepime Continue PSV trials Check labs in a.m. Critical care time 33 minutes Code Status: Full  DVT Prophylaxis  heparin   Carron CurieAli Cyle Kenyon M.D on 04/11/2014 at 11:56 AM

## 2014-04-12 LAB — CBC
HEMATOCRIT: 29 % — AB (ref 36.0–46.0)
Hemoglobin: 9.6 g/dL — ABNORMAL LOW (ref 12.0–15.0)
MCH: 29.8 pg (ref 26.0–34.0)
MCHC: 33.1 g/dL (ref 30.0–36.0)
MCV: 90.1 fL (ref 78.0–100.0)
Platelets: 141 10*3/uL — ABNORMAL LOW (ref 150–400)
RBC: 3.22 MIL/uL — ABNORMAL LOW (ref 3.87–5.11)
RDW: 14.2 % (ref 11.5–15.5)
WBC: 6.8 10*3/uL (ref 4.0–10.5)

## 2014-04-12 LAB — BASIC METABOLIC PANEL
BUN: 10 mg/dL (ref 6–23)
CO2: 31 mEq/L (ref 19–32)
Calcium: 8.6 mg/dL (ref 8.4–10.5)
Chloride: 93 mEq/L — ABNORMAL LOW (ref 96–112)
Creatinine, Ser: 0.48 mg/dL — ABNORMAL LOW (ref 0.50–1.10)
GFR calc Af Amer: >90 mL/min (ref >90)
GFR calc non Af Amer: >90 mL/min (ref >90)
GLUCOSE: 171 mg/dL — AB (ref 70–99)
Potassium: 2.8 mEq/L — CL (ref 3.7–5.3)
Sodium: 135 mEq/L — ABNORMAL LOW (ref 137–147)

## 2014-04-12 LAB — POTASSIUM: Potassium: 3.8 mEq/L (ref 3.7–5.3)

## 2014-04-12 NOTE — Progress Notes (Signed)
[facility]                                                                                              Progress note     Patient Demographics  Katelyn BruinDelsenia Rojas, is a 73 y.o. female  ZOX:[id number]  [id number]  DOB - 08/11/1941  Admit date - 03/24/2014  Admitting Physician Carron CurieAli Gorden Stthomas, MD  Outpatient Primary MD for the patient is [name],[name], MD  LOS - 19   Chief complaint    Respiratory failure    Protein calorie malnutrition         Subjective:   Katelyn Rojas cannot give any history ; chest tube to be removed today  Objective:   Vital signs  Temperature 98.9 Heart rate 79 Respiratory rate 18 Blood pressure 124/83 Pulse ox 100%    Exam Obtunded,  [place].AT,  Supple Neck,No JVD, No cervical lymphadenopathy appreciated.  Tracheostomy noted in midline Symmetrical Chest wall movement, decreased breath sounds bilaterally basally, RRR,No Gallops,Rubs or new Murmurs, No Parasternal Heave +ve B.Sounds, Abd Soft, Non tender, No organomegaly appriciated, No rebound - guarding or rigidity. No Cyanosis, Clubbing or edema, No new Rashes or bruise     I&Os-2170/1375 Foley - yes   Data Review   CBC  Recent Labs Lab 04/07/14 1130 04/08/14 0420 04/09/14 0610 04/10/14 0510 04/12/14 0500  WBC 10.2 8.4 5.5 5.4 6.8  HGB 5.4* 10.4* 9.7* 9.5* 9.6*  HCT 16.4* 31.8* 29.2* 28.5* 29.0*  PLT 107* 106* 116* 121* 141*  MCV 95.3 88.8 89.3 89.6 90.1  MCH 31.4 29.1 29.7 29.9 29.8  MCHC 32.9 32.7 33.2 33.3 33.1  RDW 13.4 16.3* 15.4 14.8 14.2    Chemistries   Recent Labs Lab 04/07/14 1130 04/08/14 0420 04/09/14 0610 04/10/14 0510 04/12/14 0500  NA 129* 128* 133* 134* 135*  K 4.2 4.5 3.2* 3.5* 2.8*  CL 89* 88* 91* 92* 93*  CO2 32 32 32 33* 31  GLUCOSE 159* 97 119* 154* 171*  BUN 15 13 7 8 10   CREATININE 0.60 0.55 0.50 0.46* 0.48*  CALCIUM 8.5 8.9 8.8 8.8 8.6    ------------------------------------------------------------------------------------------------------------------ CrCl is unknown because both a height and weight (above a minimum accepted value) are required for this calculation. ------------------------------------------------------------------------------------------------------------------ No results found for this basename: HGBA1C,  in the last 72 hours ------------------------------------------------------------------------------------------------------------------ No results found for this basename: CHOL, HDL, LDLCALC, TRIG, CHOLHDL, LDLDIRECT,  in the last 72 hours ------------------------------------------------------------------------------------------------------------------ No results found for this basename: TSH, T4TOTAL, FREET3, T3FREE, THYROIDAB,  in the last 72 hours ------------------------------------------------------------------------------------------------------------------ No results found for this basename: VITAMINB12, FOLATE, FERRITIN, TIBC, IRON, RETICCTPCT,  in the last 72 hours  Coagulation profile No results found for this basename: INR, PROTIME,  in the last 168 hours  No results found for this basename: DDIMER,  in the last 72 hours  Cardiac Enzymes No results found for this basename: CK, CKMB, TROPONINI, MYOGLOBIN,  in the last 168 hours ------------------------------------------------------------------------------------------------------------------ No components found with this basename: POCBNP,   Micro Results No results found for this or any previous visit (from the past 240 hour(s)).  Assessment & Plan   Respiratory failure status post ET tube placement , ventilator dependence ,tolerating PSV trials Anemia, severe-status post blood transfusion PEA/cardiac arrest with left pneumothorax status post chest tube placement on the left, removed Chronic sacral decubitus ulcer with MRSA  treated History of protein calorie malnutrition with failure to thrive off to treating at this time Dysphagia NG/OG feeding Hypertension controlled  dementia , advanced on Aricept Osteoarthritis pain control Multiple and chronic thoracic vertebral compression fractures IV fentanyl PR and Generalized weakness PT/OT on hold due to trach Hyponatremia monitor Thrombocytopenia Hypokalemia  Plan Continue IV cefepime Continue PSV trials stage V today Replete potassium Critical care time 35 minutes Code Status: Full  DVT Prophylaxis  heparin   Carron Curie M.D on 04/12/2014 at 2:58 PM

## 2014-04-13 ENCOUNTER — Other Ambulatory Visit (HOSPITAL_COMMUNITY): Payer: Self-pay

## 2014-04-13 DIAGNOSIS — Z930 Tracheostomy status: Secondary | ICD-10-CM

## 2014-04-13 LAB — POTASSIUM: Potassium: 3.2 mEq/L — ABNORMAL LOW (ref 3.7–5.3)

## 2014-04-13 NOTE — H&P (Signed)
Katelyn Rojas is an 73 y.o. female.   Chief Complaint: Pt with wt loss and severe failure to thrive Protein calorie malnutrition Dementia Dysphagia Previous L PTX after enteric tube malposition Chest tube removed 5/28 PEA cardiac arrest 04/05/14-- Chronic respiratory failure- +trach Sacral decub with wound vac- continued antibiotic Afeb; wbc wnl Request for consult for percutaneous gastric tube placement Pt has been and examined; chart reviewed Scheduled now for perc G tube in IR 6/2  HPI: HTN; COPD; DM; dementia; dysphagia; FTT/protein calorie malnutrition  Past Medical History  Diagnosis Date  . Hypertension   . 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 Arthritis   . Anxiety   . Cachexia 12/2013  . DM type 2 (diabetes mellitus, type 2)   . 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 Anemia 12/2013  . Dementia   . Compression fracture of lumbar vertebra, non-traumatic 12/2013    L3 and L1.     Past Surgical History  Procedure Laterality Date  . Vaginal hysterectomy      ? Abdominal vs. Vaginal    No family history on file. Social History:  reports that she has quit smoking. She does not have any smokeless tobacco history on file. Her alcohol and drug histories are not on file.  Allergies:  Allergies  Allergen Reactions  . Namenda [Memantine Hcl] Other (See Comments)    "hallucinations"   . Penicillins Other (See Comments)    unknown    Medications Prior to Admission  Medication Sig Dispense Refill  . albuterol (PROVENTIL HFA;VENTOLIN HFA) 108 (90 BASE) MCG/ACT inhaler Inhale 1-2 puffs into the lungs every 6 (six) hours as needed for wheezing or shortness of breath.      Marland Kitchen albuterol (PROVENTIL) (2.5 MG/3ML) 0.083% nebulizer solution Take 2.5 mg by nebulization every 4 (four) hours as needed for wheezing or shortness of breath.      . Amino Acids-Protein Hydrolys (FEEDING SUPPLEMENT, PRO-STAT SUGAR FREE 64,) LIQD Take 30 mLs by mouth 2  (two) times daily with a meal.  900 mL  0  . calcitonin, salmon, (MIACALCIN/FORTICAL) 200 UNIT/ACT nasal spray Place 1 spray into alternate nostrils daily.  3.7 mL  0  . citalopram (CELEXA) 10 MG/5ML suspension Take 15 mg by mouth daily.      Marland Kitchen Dextromethorphan-Guaifenesin (GUAIFENESIN DM) 400-20 MG TABS Take 1 tablet by mouth every 4 (four) hours as needed (congestion).      Marland Kitchen donepezil (ARICEPT) 10 MG tablet Take 10 mg by mouth at bedtime.      Marland Kitchen doxycycline (VIBRA-TABS) 100 MG tablet Take 1 tablet (100 mg total) by mouth every 12 (twelve) hours. Hold iron pills for 9 days while taking this med  19 tablet  0  . ENSURE PLUS (ENSURE PLUS) LIQD Take 237 mLs by mouth 2 (two) times daily between meals.      . famotidine (PEPCID) 20 MG tablet Take 40 mg by mouth daily.       . ferrous sulfate 325 (65 FE) MG tablet Take 325 mg by mouth 3 (three) times daily with meals.      . Fluticasone-Salmeterol (ADVAIR) 250-50 MCG/DOSE AEPB Inhale 1 puff into the lungs 2 (two) times daily.      . food thickener (SIMPLYTHICK) POWD Take 1 packet by mouth as needed (nectar).      . furosemide (LASIX) 20 MG tablet Take 20 mg by mouth daily.      Marland Kitchen HYDROcodone-acetaminophen (HYCET) 7.5-325 mg/15  ml solution Take 10 mLs by mouth every 4 (four) hours as needed for moderate pain.  473 mL  0  . Iloperidone (FANAPT) 1 MG TABS Take 3 mg by mouth at bedtime.      . megestrol (MEGACE) 40 MG/ML suspension Take 400 mg by mouth daily.      . mirtazapine (REMERON SOL-TAB) 15 MG disintegrating tablet Take 0.5 tablets (7.5 mg total) by mouth at bedtime.  30 tablet  0  . perphenazine (TRILAFON) 2 MG tablet Take 6 mg by mouth at bedtime.      . predniSONE (DELTASONE) 10 MG tablet Take 10 mg by mouth daily with breakfast.      . predniSONE (DELTASONE) 20 MG tablet Take 2 tablets (40 mg total) by mouth daily with breakfast. For 4 days, then take 1.5 tabs for 4 days, then take 1 tab for 4 days then take half pill plus 10 mg pill for 4 days  then take only 10 mg pill.  20 tablet  0  . QUEtiapine (SEROQUEL) 25 MG tablet Take 50 mg by mouth at bedtime.       . traMADol (ULTRAM) 50 MG tablet Take 50 mg by mouth every 6 (six) hours as needed for moderate pain.      . Vitamin D, Ergocalciferol, (DRISDOL) 50000 UNITS CAPS capsule Take 50,000 Units by mouth every 7 (seven) days. Monday        Results for orders placed during the hospital encounter of 03/24/14 (from the past 48 hour(s))  CBC     Status: Abnormal   Collection Time    04/12/14  5:00 AM      Result Value Ref Range   WBC 6.8  4.0 - 10.5 K/uL   RBC 3.22 (*) 3.87 - 5.11 MIL/uL   Hemoglobin 9.6 (*) 12.0 - 15.0 g/dL   HCT 29.0 (*) 36.0 - 46.0 %   MCV 90.1  78.0 - 100.0 fL   MCH 29.8  26.0 - 34.0 pg   MCHC 33.1  30.0 - 36.0 g/dL   RDW 14.2  11.5 - 15.5 %   Platelets 141 (*) 150 - 400 K/uL  BASIC METABOLIC PANEL     Status: Abnormal   Collection Time    04/12/14  5:00 AM      Result Value Ref Range   Sodium 135 (*) 137 - 147 mEq/L   Potassium 2.8 (*) 3.7 - 5.3 mEq/L   Comment: CRITICAL RESULT CALLED TO, READ BACK BY AND VERIFIED WITH:     TROGDEN M.,RN 04/12/14 0749 BY JONESJ   Chloride 93 (*) 96 - 112 mEq/L   CO2 31  19 - 32 mEq/L   Glucose, Bld 171 (*) 70 - 99 mg/dL   BUN 10  6 - 23 mg/dL   Creatinine, Ser 0.48 (*) 0.50 - 1.10 mg/dL   Calcium 8.6  8.4 - 10.5 mg/dL   GFR calc non Af Amer >90  >90 mL/min   GFR calc Af Amer >90  >90 mL/min   Comment: (NOTE)     The eGFR has been calculated using the CKD EPI equation.     This calculation has not been validated in all clinical situations.     eGFR's persistently <90 mL/min signify possible Chronic Kidney     Disease.  POTASSIUM     Status: None   Collection Time    04/12/14  6:00 PM      Result Value Ref Range   Potassium 3.8  3.7 - 5.3 mEq/L  POTASSIUM     Status: Abnormal   Collection Time    04/13/14  5:45 AM      Result Value Ref Range   Potassium 3.2 (*) 3.7 - 5.3 mEq/L   No results found.  Review  of Systems  Constitutional: Positive for weight loss. Negative for fever.  Respiratory: Positive for shortness of breath.   Neurological: Positive for weakness.    There were no vitals taken for this visit. Physical Exam  Constitutional:  Thin; frail; wt loss; FTT  Cardiovascular: Normal rate.   No murmur heard. Respiratory: Effort normal. She has wheezes.  GI: Soft. She exhibits distension.  Musculoskeletal: Normal range of motion.  Neurological:  Communicates and follows some commands Demented; cannot answer questions appropriately  Skin: Skin is warm.  Psychiatric:  Family to consent     Assessment/Plan Protein calorie malnutrition Severe FTT Dysphagia; dementia--need long term care Scheduled for percutaneous G tube in IR 6/2 Check kub Barium ordered Labs wnl afeb Consent signed and in chart  Lavonia Drafts 04/13/2014, 3:33 PM

## 2014-04-13 NOTE — Progress Notes (Signed)
PULMONARY / CRITICAL CARE MEDICINE  Name: Katelyn Rojas MRN: [id number] DOB: Aug 18, 1941    ADMISSION DATE:  03/24/2014 CONSULTATION DATE:  03/26/2014  REFERRING MD :  Dr. Sharyon Medicus  CHIEF COMPLAINT:  Acute on chronic respiratory failure  BRIEF PATIENT DESCRIPTION: 73 year old ES-COPD female with failure to thrive (88 lbs) who was intubated multiple times in an outside facility presenting to [facility] for rehab.  Family wants full code status including trach/peg. cetst tube by [facility] for NGT related ptx complication?  INTERVAL HISTORY: Fails weaning trials.  VITAL SIGNS:Vital signs reviewed. Abnormal values will appear under impression plan section.    PHYSICAL EXAMINATION: General:   Cachectic, severe kyphoscoliosis.  Neuro:  Awake, alert, follows commands, tracks and follows at times. Contracted  HEENT:  Tracheostomy site intact, no hemorrhage Neck:  No JVD Cardiovascular:  Regular, no murmurs Lungs:  Bilateral diminished air entry. Left CT site dressing intact .  Abdomen:  Soft, non tender Musculoskeletal:  No edema Skin:  Sacral decub with wound vac  LABS: CBC  Recent Labs Lab 04/09/14 0610 04/10/14 0510 04/12/14 0500  WBC 5.5 5.4 6.8  HGB 9.7* 9.5* 9.6*  HCT 29.2* 28.5* 29.0*  PLT 116* 121* 141*   Coag's No results found for this basename: APTT, INR,  in the last 168 hours BMET  Recent Labs Lab 04/09/14 0610 04/10/14 0510 04/12/14 0500 04/12/14 1800 04/13/14 0545  NA 133* 134* 135*  --   --   K 3.2* 3.5* 2.8* 3.8 3.2*  CL 91* 92* 93*  --   --   CO2 32 33* 31  --   --   BUN 7 8 10   --   --   CREATININE 0.50 0.46* 0.48*  --   --   GLUCOSE 119* 154* 171*  --   --    Electrolytes  Recent Labs Lab 04/09/14 0610 04/10/14 0510 04/12/14 0500  CALCIUM 8.8 8.8 8.6   5/31: [place] air, basilar atx R>L  ASSESSMENT / PLAN:  Acute on chronic respiratory failure PEA arrest 5/24 due Left pneumothorax after enteric feeding tube placed in airway (CT removed  5/28) End-stage COPD, no evidence of exacerbation Severe malnutrition / cachexia  Chronic sacral decub Tracheostomy status Dementia  Dysphagia  Acute blood loss anemia. Not sure how much is dilutional   Discussion Prognosis not good  Discussion   Cefepime per med svc, have discussed stopping  Wean as tolerated  Would repeat pcxr in am as well for ptx and rt base infiltrate  May be medically ineffective theapy and cause suffering ,need goals of life addressed.    If family wishes to continue will prob need vent/snf   PCCM ATTENDING: I have interviewed and examined the patient and reviewed the database. I have formulated the assessment and plan as reflected in the note above with amendments made by me.   Seen with ACNP Alonza Smoker, MD;  PCCM service; Mobile [phone]

## 2014-04-14 ENCOUNTER — Other Ambulatory Visit (HOSPITAL_COMMUNITY): Payer: Self-pay

## 2014-04-14 DIAGNOSIS — Z930 Tracheostomy status: Secondary | ICD-10-CM

## 2014-04-14 LAB — CBC
HEMATOCRIT: 29.9 % — AB (ref 36.0–46.0)
Hemoglobin: 9.9 g/dL — ABNORMAL LOW (ref 12.0–15.0)
MCH: 29.9 pg (ref 26.0–34.0)
MCHC: 33.1 g/dL (ref 30.0–36.0)
MCV: 90.3 fL (ref 78.0–100.0)
Platelets: 171 10*3/uL (ref 150–400)
RBC: 3.31 MIL/uL — AB (ref 3.87–5.11)
RDW: 13.9 % (ref 11.5–15.5)
WBC: 8.2 10*3/uL (ref 4.0–10.5)

## 2014-04-14 LAB — PROTIME-INR
INR: 1.16 (ref 0.00–1.49)
Prothrombin Time: 14.6 seconds (ref 11.6–15.2)

## 2014-04-14 LAB — APTT: aPTT: 26 seconds (ref 24–37)

## 2014-04-14 NOTE — Progress Notes (Signed)
PULMONARY / CRITICAL CARE MEDICINE  Name: Katelyn Rojas MRN: [id number] DOB: 07-26-1941    ADMISSION DATE:  03/24/2014 CONSULTATION DATE:  03/26/2014  REFERRING MD :  Dr. Sharyon Medicus  CHIEF COMPLAINT:  Acute on chronic respiratory failure  BRIEF PATIENT DESCRIPTION: 73 year old ES-COPD female with failure to thrive (88 lbs) who was intubated multiple times in an outside facility presenting to [facility] for rehab.  Family wants full code status including trach/peg. cetst tube by [facility] for NGT related ptx complication?  INTERVAL HISTORY: Fails weaning trials.  VITAL SIGNS:Vital signs reviewed. Abnormal values will appear under impression plan section.    PHYSICAL EXAMINATION: General:   Cachectic, severe kyphoscoliosis.  Neuro:  Awake, alert, follows commands, tracks and follows at times. Contracted  HEENT:  Tracheostomy site intact, no hemorrhage Neck:  No JVD Cardiovascular:  Regular, no murmurs Lungs:  Bilateral diminished air entry. Left CT site dressing intact .  Abdomen:  Soft, non tender Musculoskeletal:  No edema Skin:  Sacral decub with wound vac  LABS: CBC  Recent Labs Lab 04/10/14 0510 04/12/14 0500 04/14/14 0700  WBC 5.4 6.8 8.2  HGB 9.5* 9.6* 9.9*  HCT 28.5* 29.0* 29.9*  PLT 121* 141* 171   Coag's  Recent Labs Lab 04/14/14 0700  APTT 26  INR 1.16   BMET  Recent Labs Lab 04/09/14 0610 04/10/14 0510 04/12/14 0500 04/12/14 1800 04/13/14 0545  NA 133* 134* 135*  --   --   K 3.2* 3.5* 2.8* 3.8 3.2*  CL 91* 92* 93*  --   --   CO2 32 33* 31  --   --   BUN 7 8 10   --   --   CREATININE 0.50 0.46* 0.48*  --   --   GLUCOSE 119* 154* 171*  --   --    Electrolytes  Recent Labs Lab 04/09/14 0610 04/10/14 0510 04/12/14 0500  CALCIUM 8.8 8.8 8.6   5/31: [place] air, basilar atx R>L  ASSESSMENT / PLAN:  Acute on chronic respiratory failure PEA arrest 5/24 due Left pneumothorax after enteric feeding tube placed in airway (CT removed 5/28) End-stage  COPD, no evidence of exacerbation Severe malnutrition / cachexia  Chronic sacral decub Tracheostomy status Dementia  Dysphagia  Acute blood loss anemia. Not sure how much is dilutional   Discussion Prognosis not good  Discussion   Cefepime per med svc, have discussed stopping  Wean as tolerated - anticipate 8 hrs of PS today  IR to place PEG  Repeat pcxr noted.  May be medically ineffective theapy and cause suffering ,need goals of life addressed.    If family wishes to continue will prob need vent/snf   [name], M.D. [facility] Pulmonary/Critical Care Medicine. Pager: [phone]. After hours pager: [phone].

## 2014-04-15 ENCOUNTER — Other Ambulatory Visit (HOSPITAL_COMMUNITY): Payer: Self-pay

## 2014-04-15 LAB — BASIC METABOLIC PANEL
BUN: 10 mg/dL (ref 6–23)
CHLORIDE: 90 meq/L — AB (ref 96–112)
CO2: 35 mEq/L — ABNORMAL HIGH (ref 19–32)
Calcium: 8.8 mg/dL (ref 8.4–10.5)
Creatinine, Ser: 0.5 mg/dL (ref 0.50–1.10)
GFR calc Af Amer: >90 mL/min (ref >90)
GFR calc non Af Amer: >90 mL/min (ref >90)
GLUCOSE: 157 mg/dL — AB (ref 70–99)
POTASSIUM: 2.6 meq/L — AB (ref 3.7–5.3)
SODIUM: 134 meq/L — AB (ref 137–147)

## 2014-04-15 LAB — POTASSIUM: POTASSIUM: 5.4 meq/L — AB (ref 3.7–5.3)

## 2014-04-16 ENCOUNTER — Other Ambulatory Visit (HOSPITAL_COMMUNITY): Payer: Self-pay

## 2014-04-16 LAB — BASIC METABOLIC PANEL
BUN: 12 mg/dL (ref 6–23)
CHLORIDE: 90 meq/L — AB (ref 96–112)
CO2: 32 mEq/L (ref 19–32)
Calcium: 9 mg/dL (ref 8.4–10.5)
Creatinine, Ser: 0.48 mg/dL — ABNORMAL LOW (ref 0.50–1.10)
GFR calc Af Amer: >90 mL/min (ref >90)
GFR calc non Af Amer: >90 mL/min (ref >90)
Glucose, Bld: 142 mg/dL — ABNORMAL HIGH (ref 70–99)
POTASSIUM: 4.5 meq/L (ref 3.7–5.3)
SODIUM: 133 meq/L — AB (ref 137–147)

## 2014-04-16 LAB — MAGNESIUM: Magnesium: 1.5 mg/dL (ref 1.5–2.5)

## 2014-04-16 MED ORDER — MIDAZOLAM HCL 2 MG/2ML IJ SOLN
INTRAMUSCULAR | Status: AC
  Filled 2014-04-16: qty 4

## 2014-04-16 MED ORDER — MIDAZOLAM HCL 2 MG/2ML IJ SOLN
INTRAMUSCULAR | Status: AC | PRN
  Administered 2014-04-16 (×2): 1 mg via INTRAVENOUS

## 2014-04-16 MED ORDER — FENTANYL CITRATE 0.05 MG/ML IJ SOLN
INTRAMUSCULAR | Status: AC | PRN
  Administered 2014-04-16 (×2): 50 ug via INTRAVENOUS

## 2014-04-16 MED ORDER — GLUCAGON HCL RDNA (DIAGNOSTIC) 1 MG IJ SOLR
INTRAMUSCULAR | Status: AC
  Administered 2014-04-16: 1 mg
  Filled 2014-04-16: qty 1

## 2014-04-16 MED ORDER — FENTANYL CITRATE 0.05 MG/ML IJ SOLN
INTRAMUSCULAR | Status: AC
  Filled 2014-04-16: qty 2

## 2014-04-16 NOTE — Procedures (Signed)
Interventional Radiology Procedure Note  Procedure: Placement of percutaneous 66F pull-through gastrostomy tube. Complications: None Recommendations: - NPO except for sips and chips remainder of today and overnight - Maintain G-tube to LWS until tomorrow morning  - May advance diet as tolerated and begin using tube tomorrow at 10:00  Signed,  Sterling Big, MD [facility] Radiology

## 2014-04-17 NOTE — Progress Notes (Signed)
PULMONARY / CRITICAL CARE MEDICINE  Name: Katelyn Rojas MRN: [id number] DOB: Jul 27, 1941    ADMISSION DATE:  03/24/2014 CONSULTATION DATE:  03/26/2014  REFERRING MD :  Dr. Sharyon Medicus  CHIEF COMPLAINT:  Acute on chronic respiratory failure  BRIEF PATIENT DESCRIPTION: 73 year old ES-COPD female with failure to thrive (88 lbs) who was intubated multiple times in an outside facility presenting to [facility] for rehab.  Family wants full code status including trach/peg. cetst tube by [facility] for NGT related ptx complication?  INTERVAL HISTORY: Fails weaning trials.  VITAL SIGNS:Vital signs reviewed. Abnormal values will appear under impression plan section.    PHYSICAL EXAMINATION: General:   Cachectic, severe kyphoscoliosis.  Neuro:  Awake, alert, follows commands, tracks and follows at times. Contracted  HEENT:  Tracheostomy site intact, no hemorrhage Neck:  No JVD Cardiovascular:  Regular, no murmurs Lungs:  Bilateral diminished air entry. Left CT site dressing intact .  Abdomen:  Soft, non tender Musculoskeletal:  No edema Skin:  Sacral decub with wound vac  LABS: CBC  Recent Labs Lab 04/12/14 0500 04/14/14 0700  WBC 6.8 8.2  HGB 9.6* 9.9*  HCT 29.0* 29.9*  PLT 141* 171   Coag's  Recent Labs Lab 04/14/14 0700  APTT 26  INR 1.16   BMET  Recent Labs Lab 04/12/14 0500  04/15/14 0515 04/15/14 1956 04/16/14 0545  NA 135*  --  134*  --  133*  K 2.8*  < > 2.6* 5.4* 4.5  CL 93*  --  90*  --  90*  CO2 31  --  35*  --  32  BUN 10  --  10  --  12  CREATININE 0.48*  --  0.50  --  0.48*  GLUCOSE 171*  --  157*  --  142*  < > = values in this interval not displayed. Electrolytes  Recent Labs Lab 04/12/14 0500 04/15/14 0515 04/16/14 0500 04/16/14 0545  CALCIUM 8.6 8.8  --  9.0  MG  --   --  1.5  --    5/31: [place] air, basilar atx R>L  ASSESSMENT / PLAN:  Acute on chronic respiratory failure PEA arrest 5/24 due Left pneumothorax after enteric feeding tube placed  in airway (CT removed 5/28) End-stage COPD, no evidence of exacerbation Severe malnutrition / cachexia  Chronic sacral decub Tracheostomy status Dementia  Dysphagia  Acute blood loss anemia. Not sure how much is dilutional   Discussion Prognosis not good  Discussion   Cefepime per med svc, have discussed stopping date  Tolerating some PS weaning but will need home vent, will begin training next week.  IR to place PEG  Repeat pcxr noted.  Family will accept no alternative to taking the patient home, will take her home with home vent, begin training next week.  [name], M.D. [facility] Pulmonary/Critical Care Medicine. Pager: [phone]. After hours pager: [phone].

## 2014-04-18 LAB — MAGNESIUM: MAGNESIUM: 1.7 mg/dL (ref 1.5–2.5)

## 2014-04-18 LAB — POTASSIUM: Potassium: 3.5 mEq/L — ABNORMAL LOW (ref 3.7–5.3)

## 2014-04-20 LAB — BASIC METABOLIC PANEL
BUN: 38 mg/dL — AB (ref 6–23)
CO2: 40 mEq/L (ref 19–32)
CREATININE: 0.45 mg/dL — AB (ref 0.50–1.10)
Calcium: 10 mg/dL (ref 8.4–10.5)
Chloride: 95 mEq/L — ABNORMAL LOW (ref 96–112)
GFR calc non Af Amer: >90 mL/min (ref >90)
GLUCOSE: 147 mg/dL — AB (ref 70–99)
POTASSIUM: 4.9 meq/L (ref 3.7–5.3)
Sodium: 143 mEq/L (ref 137–147)

## 2014-04-20 NOTE — Progress Notes (Signed)
[facility]                                                                                              Progress note     Patient Demographics  Katelyn Rojas, is a 73 y.o. female  TAV:[id number]  [id number]  DOB - [birth year]  Admit date - 03/24/2014  Admitting Physician Carron Curie, MD  Outpatient Primary MD for the patient is [name],[name], MD  LOS - 27   Chief complaint    Respiratory failure    Protein calorie malnutrition         Subjective:   Katelyn Rojas cannot give any history ;   Objective:   Vital signs  Temperature 98.1 Heart rate 106 Respiratory rate 18 Blood pressure 127/67 Pulse ox 94%    Exam Obtunded,  [place].AT,  Supple Neck,No JVD, No cervical lymphadenopathy appreciated.  Tracheostomy noted in midline Symmetrical Chest wall movement, decreased breath sounds bilaterally basally, RRR,No Gallops,Rubs or new Murmurs, No Parasternal Heave +ve B.Sounds, Abd Soft, Non tender, No organomegaly appriciated, No rebound - guarding or rigidity. No Cyanosis, Clubbing or edema, No new Rashes or bruise     I&Os-1996/1525 Foley - yes   Data Review   CBC  Recent Labs Lab 04/14/14 0700  WBC 8.2  HGB 9.9*  HCT 29.9*  PLT 171  MCV 90.3  MCH 29.9  MCHC 33.1  RDW 13.9    Chemistries   Recent Labs Lab 04/15/14 0515 04/15/14 1956 04/16/14 0500 04/16/14 0545 04/18/14 0500 04/20/14 0527  NA 134*  --   --  133*  --  143  K 2.6* 5.4*  --  4.5 3.5* 4.9  CL 90*  --   --  90*  --  95*  CO2 35*  --   --  32  --  40*  GLUCOSE 157*  --   --  142*  --  147*  BUN 10  --   --  12  --  38*  CREATININE 0.50  --   --  0.48*  --  0.45*  CALCIUM 8.8  --   --  9.0  --  10.0  MG  --   --  1.5  --  1.7  --    ------------------------------------------------------------------------------------------------------------------ CrCl is unknown because both a height  and weight (above a minimum accepted value) are required for this calculation. ------------------------------------------------------------------------------------------------------------------ No results found for this basename: HGBA1C,  in the last 72 hours ------------------------------------------------------------------------------------------------------------------ No results found for this basename: CHOL, HDL, LDLCALC, TRIG, CHOLHDL, LDLDIRECT,  in the last 72 hours ------------------------------------------------------------------------------------------------------------------ No results found for this basename: TSH, T4TOTAL, FREET3, T3FREE, THYROIDAB,  in the last 72 hours ------------------------------------------------------------------------------------------------------------------ No results found for this basename: VITAMINB12, FOLATE, FERRITIN, TIBC, IRON, RETICCTPCT,  in the last 72 hours  Coagulation profile  Recent Labs Lab 04/14/14 0700  INR 1.16    No results found for this basename: DDIMER,  in the last 72 hours  Cardiac Enzymes No results found for this basename: CK, CKMB, TROPONINI, MYOGLOBIN,  in the last 168 hours ------------------------------------------------------------------------------------------------------------------ No components found with this basename: POCBNP,  Micro Results No results found for this or any previous visit (from the past 240 hour(s)).     Assessment & Plan   Respiratory failure status post tracheostomy , ventilator dependence , not tolerating PSV trials seems that she is going to be vent dependent  Anemia, severe-status post blood transfusion PEA/cardiac arrest with left pneumothorax status post chest tube placement on the left, removed Chronic sacral decubitus ulcer with MRSA treated History of protein calorie malnutrition/dysphagia continue with tube feeding Hypertension controlled  dementia , advanced on  Aricept Osteoarthritis pain control Multiple and chronic thoracic vertebral compression fractures IV fentanyl PRN Generalized weakness continue with PT/OT  Hyponatremia resolved Thrombocytopenia resolved Hypokalemia resolved  Plan Continue same medications Discussed with pulmonary critical care today, her prognosis is poor. Very difficult to wean off the vent Code Status: Full  DVT Prophylaxis  heparin   Carron CurieAli Puanani Gene M.D on 04/20/2014 at 4:50 PM

## 2014-04-20 NOTE — Progress Notes (Signed)
PULMONARY / CRITICAL CARE MEDICINE  Name: Katelyn Rojas MRN: [id number] DOB: 08/25/1941    ADMISSION DATE:  03/24/2014 CONSULTATION DATE:  03/26/2014  REFERRING MD :  Dr. Sharyon Medicus  CHIEF COMPLAINT:  Acute on chronic respiratory failure  BRIEF PATIENT DESCRIPTION: 73 year old ES-COPD female with failure to thrive (88 lbs) who was intubated multiple times in an outside facility presenting to [facility] for rehab.  Family wants full code status including trach/peg. cetst tube by [facility] for NGT related ptx complication?  INTERVAL HISTORY: Fails weaning trials.  VITAL SIGNS:Vital signs reviewed. Abnormal values will appear under impression plan section.    PHYSICAL EXAMINATION: General:   Cachectic, severe kyphoscoliosis.  Neuro:  Awake, alert, follows commands, tracks and follows at times. Contracted  HEENT:  Tracheostomy site intact, no hemorrhage Neck:  No JVD Cardiovascular:  Regular, no murmurs Lungs:  Bilateral diminished air entry. Left CT site dressing intact .  Abdomen:  Soft, non tender Musculoskeletal:  No edema Skin:  Sacral decub with wound vac  LABS: CBC  Recent Labs Lab 04/14/14 0700  WBC 8.2  HGB 9.9*  HCT 29.9*  PLT 171   Coag's  Recent Labs Lab 04/14/14 0700  APTT 26  INR 1.16   BMET  Recent Labs Lab 04/15/14 0515  04/16/14 0545 04/18/14 0500 04/20/14 0527  NA 134*  --  133*  --  143  K 2.6*  < > 4.5 3.5* 4.9  CL 90*  --  90*  --  95*  CO2 35*  --  32  --  40*  BUN 10  --  12  --  38*  CREATININE 0.50  --  0.48*  --  0.45*  GLUCOSE 157*  --  142*  --  147*  < > = values in this interval not displayed. Electrolytes  Recent Labs Lab 04/15/14 0515 04/16/14 0500 04/16/14 0545 04/18/14 0500 04/20/14 0527  CALCIUM 8.8  --  9.0  --  10.0  MG  --  1.5  --  1.7  --    5/31: [place] air, basilar atx R>L  ASSESSMENT / PLAN: Resolved problem list PEA arrest 5/24 due Left pneumothorax after enteric feeding tube placed in airway (CT removed  5/28) Acute blood loss anemia Acute on chronic respiratory failure AECOPD   Active problem list (updated 6/8) Chronic respiratory failure/ failure to wean  Tracheostomy status End-stage COPD Severe malnutrition / cachexia  Chronic sacral decub Dementia  Dysphagia  Anemia of critical illness   Discussion Prognosis not good Family will accept no alternative to taking the patient home, will take her home with home vent, begin training next week.  Pulmonary Recommendations   Tolerating some PS weaning but not likely to be liberated from vent.   Home vent training to begin at some point although I feel that this is not a safe discharge & this pt will likely end up back in the hospital  Will need to establish pulmonary f/u to assist w/ home vent requirements.              Consider palliative care consult  Cyril Mourning MD. FCCP. [place] Pulmonary & Critical care Pager [phone] If no response call [phone]

## 2014-04-21 ENCOUNTER — Other Ambulatory Visit (HOSPITAL_COMMUNITY): Payer: Self-pay

## 2014-04-21 LAB — CBC
HCT: 35.6 % — ABNORMAL LOW (ref 36.0–46.0)
HEMOGLOBIN: 10.8 g/dL — AB (ref 12.0–15.0)
MCH: 28.9 pg (ref 26.0–34.0)
MCHC: 30.3 g/dL (ref 30.0–36.0)
MCV: 95.2 fL (ref 78.0–100.0)
Platelets: 298 10*3/uL (ref 150–400)
RBC: 3.74 MIL/uL — ABNORMAL LOW (ref 3.87–5.11)
RDW: 14.9 % (ref 11.5–15.5)
WBC: 10.5 10*3/uL (ref 4.0–10.5)

## 2014-04-21 NOTE — Progress Notes (Signed)
[facility]                                                                                              Progress note     Patient Demographics  Katelyn Rojas, is a 73 y.o. female  SUP:[id number]  [id number]  DOB - [birth year]  Admit date - 03/24/2014  Admitting Physician Carron Curie, MD  Outpatient Primary MD for the patient is [name],[name], MD  LOS - 28   Chief complaint    Respiratory failure    Protein calorie malnutrition         Subjective:   Katelyn Rojas cannot give any history ;   Objective:   Vital signs  Temperature 98.1 Heart rate 106 Respiratory rate 18 Blood pressure 127/67 Pulse ox 94%    Exam Obtunded,  [place].AT,  Supple Neck,No JVD, No cervical lymphadenopathy appreciated.  Tracheostomy noted in midline Symmetrical Chest wall movement, decreased breath sounds bilaterally basally, RRR,No Gallops,Rubs or new Murmurs, No Parasternal Heave +ve B.Sounds, Abd Soft, Non tender, No organomegaly appriciated, No rebound - guarding or rigidity. No Cyanosis, Clubbing or edema, No new Rashes or bruise     I&Os-2327/1408 Foley - yes   Data Review   CBC  Recent Labs Lab 04/21/14 0734  WBC 10.5  HGB 10.8*  HCT 35.6*  PLT 298  MCV 95.2  MCH 28.9  MCHC 30.3  RDW 14.9    Chemistries   Recent Labs Lab 04/15/14 0515 04/15/14 1956 04/16/14 0500 04/16/14 0545 04/18/14 0500 04/20/14 0527  NA 134*  --   --  133*  --  143  K 2.6* 5.4*  --  4.5 3.5* 4.9  CL 90*  --   --  90*  --  95*  CO2 35*  --   --  32  --  40*  GLUCOSE 157*  --   --  142*  --  147*  BUN 10  --   --  12  --  38*  CREATININE 0.50  --   --  0.48*  --  0.45*  CALCIUM 8.8  --   --  9.0  --  10.0  MG  --   --  1.5  --  1.7  --    ------------------------------------------------------------------------------------------------------------------ CrCl is unknown because both a height  and weight (above a minimum accepted value) are required for this calculation. ------------------------------------------------------------------------------------------------------------------ No results found for this basename: HGBA1C,  in the last 72 hours ------------------------------------------------------------------------------------------------------------------ No results found for this basename: CHOL, HDL, LDLCALC, TRIG, CHOLHDL, LDLDIRECT,  in the last 72 hours ------------------------------------------------------------------------------------------------------------------ No results found for this basename: TSH, T4TOTAL, FREET3, T3FREE, THYROIDAB,  in the last 72 hours ------------------------------------------------------------------------------------------------------------------ No results found for this basename: VITAMINB12, FOLATE, FERRITIN, TIBC, IRON, RETICCTPCT,  in the last 72 hours  Coagulation profile No results found for this basename: INR, PROTIME,  in the last 168 hours  No results found for this basename: DDIMER,  in the last 72 hours  Cardiac Enzymes No results found for this basename: CK, CKMB, TROPONINI, MYOGLOBIN,  in the last 168 hours ------------------------------------------------------------------------------------------------------------------ No components found with  this basename: POCBNP,   Micro Results No results found for this or any previous visit (from the past 240 hour(s)).     Assessment & Plan   Respiratory failure status post tracheostomy , ventilator dependence , not tolerating PSV trials seems that she is going to be vent dependent  Anemia, severe-status post blood transfusion PEA/cardiac arrest with left pneumothorax status post chest tube placement on the left, removed Chronic sacral decubitus ulcer with MRSA treated History of protein calorie malnutrition/dysphagia continue with tube feeding Hypertension controlled  dementia ,  advanced on Aricept Osteoarthritis pain control Multiple and chronic thoracic vertebral compression fractures IV fentanyl PRN Generalized weakness continue with PT/OT  Hyponatremia resolved Thrombocytopenia resolved Hypokalemia resolved  Plan DC scopolamine patch DC aricept Code Status: Full  DVT Prophylaxis  heparin   Carron CurieAli Shravan Salahuddin M.D on 04/21/2014 at 6:31 PM

## 2014-04-22 LAB — URINALYSIS, ROUTINE W REFLEX MICROSCOPIC
BILIRUBIN URINE: NEGATIVE
Glucose, UA: NEGATIVE mg/dL
KETONES UR: NEGATIVE mg/dL
LEUKOCYTES UA: NEGATIVE
Nitrite: NEGATIVE
PROTEIN: 100 mg/dL — AB
Specific Gravity, Urine: 1.022 (ref 1.005–1.030)
Urobilinogen, UA: 1 mg/dL (ref 0.0–1.0)
pH: 8 (ref 5.0–8.0)

## 2014-04-22 LAB — URINE MICROSCOPIC-ADD ON

## 2014-04-22 NOTE — Progress Notes (Signed)
[facility]                                                                                              Progress note     Patient Demographics  Katelyn Rojas, is a 73 y.o. female  ZOX:[id number]  [id number]  DOB - 09/15/1941  Admit date - 03/24/2014  Admitting Physician Carron CurieAli Faiga Stones, MD  Outpatient Primary MD for the patient is [name],[name], MD  LOS - 29   Chief complaint    Respiratory failure    Protein calorie malnutrition         Subjective:   Katelyn Rojas cannot give any history ;   Objective:   Vital signs  Temperature 98.4 Heart rate 124 Respiratory rate 22 Blood pressure 121/71 Pulse ox 94%    Exam Obtunded,  [place].AT,  Supple Neck,No JVD, No cervical lymphadenopathy appreciated.  Tracheostomy noted in midline Symmetrical Chest wall movement, decreased breath sounds bilaterally basally, RRR,No Gallops,Rubs or new Murmurs, No Parasternal Heave +ve B.Sounds, Abd Soft, Non tender, No organomegaly appriciated, No rebound - guarding or rigidity. No Cyanosis, Clubbing or edema, No new Rashes or bruise     I&Os-1907/1122 Foley - yes   Data Review   CBC  Recent Labs Lab 04/21/14 0734  WBC 10.5  HGB 10.8*  HCT 35.6*  PLT 298  MCV 95.2  MCH 28.9  MCHC 30.3  RDW 14.9    Chemistries   Recent Labs Lab 04/15/14 1956 04/16/14 0500 04/16/14 0545 04/18/14 0500 04/20/14 0527  NA  --   --  133*  --  143  K 5.4*  --  4.5 3.5* 4.9  CL  --   --  90*  --  95*  CO2  --   --  32  --  40*  GLUCOSE  --   --  142*  --  147*  BUN  --   --  12  --  38*  CREATININE  --   --  0.48*  --  0.45*  CALCIUM  --   --  9.0  --  10.0  MG  --  1.5  --  1.7  --    ------------------------------------------------------------------------------------------------------------------ CrCl is unknown because both a height and weight (above a minimum accepted value) are  required for this calculation. ------------------------------------------------------------------------------------------------------------------ No results found for this basename: HGBA1C,  in the last 72 hours ------------------------------------------------------------------------------------------------------------------ No results found for this basename: CHOL, HDL, LDLCALC, TRIG, CHOLHDL, LDLDIRECT,  in the last 72 hours ------------------------------------------------------------------------------------------------------------------ No results found for this basename: TSH, T4TOTAL, FREET3, T3FREE, THYROIDAB,  in the last 72 hours ------------------------------------------------------------------------------------------------------------------ No results found for this basename: VITAMINB12, FOLATE, FERRITIN, TIBC, IRON, RETICCTPCT,  in the last 72 hours  Coagulation profile No results found for this basename: INR, PROTIME,  in the last 168 hours  No results found for this basename: DDIMER,  in the last 72 hours  Cardiac Enzymes No results found for this basename: CK, CKMB, TROPONINI, MYOGLOBIN,  in the last 168 hours ------------------------------------------------------------------------------------------------------------------ No components found with this basename: POCBNP,   Micro Results Recent Results (from the past 240  hour(s))  CULTURE, RESPIRATORY (NON-EXPECTORATED)     Status: None   Collection Time    04/22/14  1:03 AM      Result Value Ref Range Status   Specimen Description TRACHEAL ASPIRATE   Final   Special Requests NONE   Final   Gram Stain     Final   Value: ABUNDANT WBC PRESENT,BOTH PMN AND MONONUCLEAR     NO SQUAMOUS EPITHELIAL CELLS SEEN     MODERATE GRAM POSITIVE COCCI     IN PAIRS IN CLUSTERS     Performed at [facility]   Culture PENDING   Incomplete   Report Status PENDING   Incomplete       Assessment & Plan  SIRS; started on IV  vancomycin and meropenem yesterday. Events of yesterday reviewed Respiratory failure status post tracheostomy , ventilator dependence , not tolerating PSV trials seems that she is going to be vent dependent  Anemia, severe-status post blood transfusion PEA/cardiac arrest with left pneumothorax status post chest tube placement on the left, removed Chronic sacral decubitus ulcer with MRSA treated History of protein calorie malnutrition/dysphagia continue with tube feeding Hypertension controlled  dementia , advanced on Aricept Osteoarthritis pain control Multiple and chronic thoracic vertebral compression fractures IV fentanyl PRN Generalized weakness continue with PT/OT  Hyponatremia resolved Thrombocytopenia resolved Hypokalemia resolved Ileus  Plan  Placed G-tube to suction  Rectal tube IV fluids Follow cultures Check CBC BMP in a.m. Critical care time 41 minutes  Code Status: Full  DVT Prophylaxis  heparin   Carron Curie M.D on 04/22/2014 at 2:47 PM

## 2014-04-22 NOTE — Progress Notes (Signed)
PULMONARY / CRITICAL CARE MEDICINE  Name: Katelyn Rojas MRN: [id number] DOB: Aug 02, 1941    ADMISSION DATE:  03/24/2014 CONSULTATION DATE:  03/26/2014  REFERRING MD :  Dr. 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  Family wants full code status including trach/peg. Course complicated by Left Chest tube by [facility] for NGT related left ptx   INTERVAL HISTORY: Failing weaning trials.  VITAL SIGNS:Vital signs reviewed.   PHYSICAL EXAMINATION: General:   Cachectic, severe kyphoscoliosis.  Neuro:  Awake, alert, follows commands, tracks and follows at times. Contracted  HEENT:  Tracheostomy site intact, no hemorrhage Neck:  No JVD Cardiovascular:  Regular, no murmurs Lungs:  Bilateral diminished air entry. Left CT site dressing intact .  Abdomen:  Soft, non tender Musculoskeletal:  No edema Skin:  Sacral decub with wound vac  LABS: CBC  Recent Labs Lab 04/21/14 0734  WBC 10.5  HGB 10.8*  HCT 35.6*  PLT 298   Coag's No results found for this basename: APTT, INR,  in the last 168 hours BMET  Recent Labs Lab 04/16/14 0545 04/18/14 0500 04/20/14 0527  NA 133*  --  143  K 4.5 3.5* 4.9  CL 90*  --  95*  CO2 32  --  40*  BUN 12  --  38*  CREATININE 0.48*  --  0.45*  GLUCOSE 142*  --  147*   Electrolytes  Recent Labs Lab 04/16/14 0500 04/16/14 0545 04/18/14 0500 04/20/14 0527  CALCIUM  --  9.0  --  10.0  MG 1.5  --  1.7  --    6/9 - no pnthx, no new infx  ASSESSMENT / PLAN: Resolved problem list PEA arrest 5/24 due Left pneumothorax after enteric feeding tube placed in airway (CT removed 5/28) Acute blood loss anemia Acute on chronic respiratory failure AECOPD   Active problem list (updated 6/8) Chronic respiratory failure/ failure to wean  Tracheostomy  status End-stage COPD Severe malnutrition / cachexia  Chronic sacral decub Dementia  Dysphagia  Anemia of critical illness  Ileus 6/10  Discussion Prognosis not good Apparently,Family will accept no alternative to taking the patient home, want to take her home with home vent  Pulmonary Recommendations   Tolerated some PS weaning but not likely to be liberated from vent.   Home vent training to begin at some point although this may not be a safe discharge & this pt will likely end up back in the hospital  Will need to establish pulmonary f/u to assist w/ home vent requirements.              Consider palliative care consult  Cyril Mourning MD. FCCP. [place] [facility] Pager [phone] If no response call [phone]

## 2014-04-23 ENCOUNTER — Other Ambulatory Visit (HOSPITAL_COMMUNITY): Payer: Self-pay

## 2014-04-23 LAB — BASIC METABOLIC PANEL
BUN: 33 mg/dL — ABNORMAL HIGH (ref 6–23)
CHLORIDE: 112 meq/L (ref 96–112)
CO2: 37 meq/L — AB (ref 19–32)
CREATININE: 0.71 mg/dL (ref 0.50–1.10)
Calcium: 9.9 mg/dL (ref 8.4–10.5)
GFR calc non Af Amer: 85 mL/min — ABNORMAL LOW (ref >90)
Glucose, Bld: 143 mg/dL — ABNORMAL HIGH (ref 70–99)
POTASSIUM: 3.7 meq/L (ref 3.7–5.3)
SODIUM: 159 meq/L — AB (ref 137–147)

## 2014-04-23 LAB — URINE CULTURE
Colony Count: NO GROWTH
Culture: NO GROWTH

## 2014-04-23 LAB — CBC
HEMATOCRIT: 30.4 % — AB (ref 36.0–46.0)
Hemoglobin: 8.9 g/dL — ABNORMAL LOW (ref 12.0–15.0)
MCH: 28.4 pg (ref 26.0–34.0)
MCHC: 29.3 g/dL — ABNORMAL LOW (ref 30.0–36.0)
MCV: 97.1 fL (ref 78.0–100.0)
Platelets: 263 10*3/uL (ref 150–400)
RBC: 3.13 MIL/uL — AB (ref 3.87–5.11)
RDW: 16.4 % — AB (ref 11.5–15.5)
WBC: 16.3 10*3/uL — AB (ref 4.0–10.5)

## 2014-04-23 NOTE — Progress Notes (Signed)
[facility]                                                                                              Progress note     Patient Demographics  Katelyn BruinDelsenia Rojas, is a 73 y.o. female  ZOX:[id number]  WJX:[id number]  DOB - 12/26/1940  Admit date - 03/24/2014  Admitting Physician Carron CurieAli Nemesis Rainwater, MD  Outpatient Primary MD for the patient is [name],[name], MD  LOS - 30   Chief complaint    Respiratory failure    Protein calorie malnutrition         Subjective:   Katelyn Rojas cannot give any history ;   Objective:   Vital signs  Temperature 99.5 Heart rate 82 Respiratory rate 18 Blood pressure 128/67 Pulse ox 94%    Exam Obtunded,  [place].AT,  Supple Neck,No JVD, No cervical lymphadenopathy appreciated.  Tracheostomy noted in midline Symmetrical Chest wall movement, decreased breath sounds bilaterally basally, RRR,No Gallops,Rubs or new Murmurs, No Parasternal Heave +ve B.Sounds, Abd Soft, Non tender, No organomegaly appriciated, No rebound - guarding or rigidity. No Cyanosis, Clubbing or edema, No new Rashes or bruise     I&Os-1765/600 Foley - yes   Data Review   CBC  Recent Labs Lab 04/21/14 0734 04/23/14 0750  WBC 10.5 16.3*  HGB 10.8* 8.9*  HCT 35.6* 30.4*  PLT 298 263  MCV 95.2 97.1  MCH 28.9 28.4  MCHC 30.3 29.3*  RDW 14.9 16.4*    Chemistries   Recent Labs Lab 04/18/14 0500 04/20/14 0527 04/23/14 0750  NA  --  143 159*  K 3.5* 4.9 3.7  CL  --  95* 112  CO2  --  40* 37*  GLUCOSE  --  147* 143*  BUN  --  38* 33*  CREATININE  --  0.45* 0.71  CALCIUM  --  10.0 9.9  MG 1.7  --   --    ------------------------------------------------------------------------------------------------------------------ CrCl is unknown because both a height and weight (above a minimum accepted value) are required for this  calculation. ------------------------------------------------------------------------------------------------------------------ No results found for this basename: HGBA1C,  in the last 72 hours ------------------------------------------------------------------------------------------------------------------ No results found for this basename: CHOL, HDL, LDLCALC, TRIG, CHOLHDL, LDLDIRECT,  in the last 72 hours ------------------------------------------------------------------------------------------------------------------ No results found for this basename: TSH, T4TOTAL, FREET3, T3FREE, THYROIDAB,  in the last 72 hours ------------------------------------------------------------------------------------------------------------------ No results found for this basename: VITAMINB12, FOLATE, FERRITIN, TIBC, IRON, RETICCTPCT,  in the last 72 hours  Coagulation profile No results found for this basename: INR, PROTIME,  in the last 168 hours  No results found for this basename: DDIMER,  in the last 72 hours  Cardiac Enzymes No results found for this basename: CK, CKMB, TROPONINI, MYOGLOBIN,  in the last 168 hours ------------------------------------------------------------------------------------------------------------------ No components found with this basename: POCBNP,   Micro Results Recent Results (from the past 240 hour(s))  CULTURE, BLOOD (ROUTINE X 2)     Status: None   Collection Time    04/21/14 10:10 PM      Result Value Ref Range Status   Specimen Description BLOOD RIGHT PICC LINE   Final  Special Requests BOTTLES DRAWN AEROBIC AND ANAEROBIC 8CC   Final   Culture  Setup Time     Final   Value: 04/22/2014 04:24     Performed at [facility]   Culture     Final   Value:        BLOOD CULTURE RECEIVED NO GROWTH TO DATE CULTURE WILL BE HELD FOR 5 DAYS BEFORE ISSUING A FINAL NEGATIVE REPORT     Performed at [facility]   Report Status PENDING   Incomplete  CULTURE,  BLOOD (ROUTINE X 2)     Status: None   Collection Time    04/21/14 10:38 PM      Result Value Ref Range Status   Specimen Description BLOOD LEFT HAND   Final   Special Requests BOTTLES DRAWN AEROBIC ONLY 5CC   Final   Culture  Setup Time     Final   Value: 04/22/2014 04:24     Performed at [facility]   Culture     Final   Value: GRAM POSITIVE COCCI IN CLUSTERS     Note: Gram Stain Report Called to,Read Back By and Verified With: [facility] BADGETT 04/23/14 AT 0235 RIDK     Performed at [facility]   Report Status PENDING   Incomplete  URINE CULTURE     Status: None   Collection Time    04/22/14  1:00 AM      Result Value Ref Range Status   Specimen Description URINE, RANDOM   Final   Special Requests NONE   Final   Culture  Setup Time     Final   Value: 04/22/2014 02:53     Performed at [facility] Count     Final   Value: NO GROWTH     Performed at [facility]   Culture     Final   Value: NO GROWTH     Performed at [facility]   Report Status 04/23/2014 FINAL   Final  CULTURE, RESPIRATORY (NON-EXPECTORATED)     Status: None   Collection Time    04/22/14  1:03 AM      Result Value Ref Range Status   Specimen Description TRACHEAL ASPIRATE   Final   Special Requests NONE   Final   Gram Stain     Final   Value: ABUNDANT WBC PRESENT,BOTH PMN AND MONONUCLEAR     NO SQUAMOUS EPITHELIAL CELLS SEEN     MODERATE GRAM POSITIVE COCCI     IN PAIRS IN CLUSTERS     Performed at [facility]   Culture     Final   Value: ABUNDANT STAPHYLOCOCCUS AUREUS     Note: RIFAMPIN AND GENTAMICIN SHOULD NOT BE USED AS SINGLE DRUGS FOR TREATMENT OF STAPH INFECTIONS.     Performed at [facility]   Report Status PENDING   Incomplete       Assessment & Plan  SIRS;  on IV vancomycin and meropenem   Respiratory failure status post tracheostomy , ventilator dependence , not tolerating PSV trials seems that she is going to be vent  dependent  Anemia, severe-status post blood transfusion PEA/cardiac arrest with left pneumothorax status post chest tube placement on the left, removed Chronic sacral decubitus ulcer with MRSA treated History of protein calorie malnutrition/dysphagia continue with tube feeding Hypertension controlled  dementia , advanced on Aricept Osteoarthritis pain control Multiple and chronic thoracic vertebral compression fractures IV fentanyl  PRN Generalized weakness continue with PT/OT  Hypernatremia Thrombocytopenia resolved Hypokalemia resolved Ileus  Plan  D5 water  Continue IV antibiotics Check abdominal x-ray in a.m. Check BMP in a.m.  Code Status: Full  DVT Prophylaxis  heparin   Carron Curie M.D on 04/23/2014 at 2:39 PM

## 2014-04-24 ENCOUNTER — Other Ambulatory Visit (HOSPITAL_COMMUNITY): Payer: Self-pay

## 2014-04-24 LAB — BASIC METABOLIC PANEL
BUN: 33 mg/dL — ABNORMAL HIGH (ref 6–23)
CO2: 34 meq/L — AB (ref 19–32)
Calcium: 9.2 mg/dL (ref 8.4–10.5)
Chloride: 110 mEq/L (ref 96–112)
Creatinine, Ser: 0.65 mg/dL (ref 0.50–1.10)
GFR calc non Af Amer: 87 mL/min — ABNORMAL LOW (ref >90)
Glucose, Bld: 113 mg/dL — ABNORMAL HIGH (ref 70–99)
POTASSIUM: 3.2 meq/L — AB (ref 3.7–5.3)
Sodium: 154 mEq/L — ABNORMAL HIGH (ref 137–147)

## 2014-04-24 LAB — CULTURE, RESPIRATORY

## 2014-04-24 LAB — CULTURE, RESPIRATORY W GRAM STAIN

## 2014-04-24 NOTE — Progress Notes (Signed)
[facility]                                                                                              Progress note     Patient Demographics  Katelyn BruinDelsenia Rojas, is a 73 y.o. female  MVH:[id number]  [id number]  DOB - 05/28/1941  Admit date - 03/24/2014  Admitting Physician Carron CurieAli Chaniqua Brisby, MD  Outpatient Primary MD for the patient is [name],[name], MD  LOS - 31   Chief complaint    Respiratory failure    Protein calorie malnutrition         Subjective:   [name] cannot give any history ;   Objective:   Vital signs  Temperature 99 9 Heart rate 94 Respiratory rate 18 Blood pressure 92/60 Pulse ox 96%    Exam Obtunded,  [place].AT,  Supple Neck,No JVD, No cervical lymphadenopathy appreciated.  Tracheostomy noted in midline Symmetrical Chest wall movement, decreased breath sounds bilaterally basally, RRR,No Gallops,Rubs or new Murmurs, No Parasternal Heave +ve B.Sounds, Abd protuberant, distended Non tender, No organomegaly appriciated, No rebound - guarding or rigidity. No Cyanosis, Clubbing or edema, No new Rashes or bruise     I&Os-3100/1050 Foley - yes   Data Review   CBC  Recent Labs Lab 04/21/14 0734 04/23/14 0750  WBC 10.5 16.3*  HGB 10.8* 8.9*  HCT 35.6* 30.4*  PLT 298 263  MCV 95.2 97.1  MCH 28.9 28.4  MCHC 30.3 29.3*  RDW 14.9 16.4*    Chemistries   Recent Labs Lab 04/18/14 0500 04/20/14 0527 04/23/14 0750 04/24/14 0728  NA  --  143 159* 154*  K 3.5* 4.9 3.7 3.2*  CL  --  95* 112 110  CO2  --  40* 37* 34*  GLUCOSE  --  147* 143* 113*  BUN  --  38* 33* 33*  CREATININE  --  0.45* 0.71 0.65  CALCIUM  --  10.0 9.9 9.2  MG 1.7  --   --   --    ------------------------------------------------------------------------------------------------------------------ CrCl is unknown because both a height and weight (above a minimum accepted value)  are required for this calculation. ------------------------------------------------------------------------------------------------------------------ No results found for this basename: HGBA1C,  in the last 72 hours ------------------------------------------------------------------------------------------------------------------ No results found for this basename: CHOL, HDL, LDLCALC, TRIG, CHOLHDL, LDLDIRECT,  in the last 72 hours ------------------------------------------------------------------------------------------------------------------ No results found for this basename: TSH, T4TOTAL, FREET3, T3FREE, THYROIDAB,  in the last 72 hours ------------------------------------------------------------------------------------------------------------------ No results found for this basename: VITAMINB12, FOLATE, FERRITIN, TIBC, IRON, RETICCTPCT,  in the last 72 hours  Coagulation profile No results found for this basename: INR, PROTIME,  in the last 168 hours  No results found for this basename: DDIMER,  in the last 72 hours  Cardiac Enzymes No results found for this basename: CK, CKMB, TROPONINI, MYOGLOBIN,  in the last 168 hours ------------------------------------------------------------------------------------------------------------------ No components found with this basename: POCBNP,   Micro Results Recent Results (from the past 240 hour(s))  CULTURE, BLOOD (ROUTINE X 2)     Status: None   Collection Time    04/21/14 10:10 PM      Result  Value Ref Range Status   Specimen Description BLOOD RIGHT PICC LINE   Final   Special Requests BOTTLES DRAWN AEROBIC AND ANAEROBIC 8CC   Final   Culture  Setup Time     Final   Value: 04/22/2014 04:24     Performed at [facility]   Culture     Final   Value:        BLOOD CULTURE RECEIVED NO GROWTH TO DATE CULTURE WILL BE HELD FOR 5 DAYS BEFORE ISSUING A FINAL NEGATIVE REPORT     Performed at [facility]   Report Status PENDING    Incomplete  CULTURE, BLOOD (ROUTINE X 2)     Status: None   Collection Time    04/21/14 10:38 PM      Result Value Ref Range Status   Specimen Description BLOOD LEFT HAND   Final   Special Requests BOTTLES DRAWN AEROBIC ONLY 5CC   Final   Culture  Setup Time     Final   Value: 04/22/2014 04:24     Performed at [facility]   Culture     Final   Value: STAPHYLOCOCCUS AUREUS     Note: RIFAMPIN AND GENTAMICIN SHOULD NOT BE USED AS SINGLE DRUGS FOR TREATMENT OF STAPH INFECTIONS.     Note: Gram Stain Report Called to,Read Back By and Verified With: [facility] BADGETT 04/23/14 AT 0235 RIDK     Performed at [facility]   Report Status PENDING   Incomplete  URINE CULTURE     Status: None   Collection Time    04/22/14  1:00 AM      Result Value Ref Range Status   Specimen Description URINE, RANDOM   Final   Special Requests NONE   Final   Culture  Setup Time     Final   Value: 04/22/2014 02:53     Performed at [facility]   Colony Count     Final   Value: NO GROWTH     Performed at [facility]   Culture     Final   Value: NO GROWTH     Performed at [facility]   Report Status 04/23/2014 FINAL   Final  CULTURE, RESPIRATORY (NON-EXPECTORATED)     Status: None   Collection Time    04/22/14  1:03 AM      Result Value Ref Range Status   Specimen Description TRACHEAL ASPIRATE   Final   Special Requests NONE   Final   Gram Stain     Final   Value: ABUNDANT WBC PRESENT,BOTH PMN AND MONONUCLEAR     NO SQUAMOUS EPITHELIAL CELLS SEEN     MODERATE GRAM POSITIVE COCCI     IN PAIRS IN CLUSTERS     Performed at [facility]   Culture     Final   Value: ABUNDANT METHICILLIN RESISTANT STAPHYLOCOCCUS AUREUS     Note: RIFAMPIN AND GENTAMICIN SHOULD NOT BE USED AS SINGLE DRUGS FOR TREATMENT OF STAPH INFECTIONS. CRITICAL RESULT CALLED TO, READ BACK BY AND VERIFIED WITH:  CLARINDA @910AM  6.12.15 BY OANA     Performed at [facility]   Report  Status 04/24/2014 FINAL   Final   Organism ID, Bacteria METHICILLIN RESISTANT STAPHYLOCOCCUS AUREUS   Final       Assessment & Plan  SIRS;  on IV vancomycin /  Respiratory failure status post tracheostomy , ventilator dependence , not tolerating PSV trials seems that  she is going to be vent dependent  Anemia, severe-status post blood transfusion PEA/cardiac arrest with left pneumothorax status post chest tube placement on the left, removed Chronic sacral decubitus ulcer with MRSA treated History of protein calorie malnutrition/dysphagia continue with tube feeding Hypertension controlled  dementia , advanced on Aricept Osteoarthritis pain control Multiple and chronic thoracic vertebral compression fractures IV fentanyl PRN Generalized weakness continue with PT/OT  Hypernatremia on D5 water Thrombocytopenia resolved Hypokalemia resolved Ileus followup KUB Bacteremia with staph aureus Sputum culture positive for MRSA  Plan  DC meropenem Continue with D5 water KUB in a.m. Check labs in a.m. Critical care time 36 minutes  Code Status: Full  DVT Prophylaxis  heparin   Carron Curie M.D on 04/24/2014 at 1:50 PM

## 2014-04-25 ENCOUNTER — Other Ambulatory Visit (HOSPITAL_COMMUNITY): Payer: Self-pay

## 2014-04-25 LAB — CBC
HEMATOCRIT: 26.6 % — AB (ref 36.0–46.0)
Hemoglobin: 7.8 g/dL — ABNORMAL LOW (ref 12.0–15.0)
MCH: 28.8 pg (ref 26.0–34.0)
MCHC: 29.3 g/dL — AB (ref 30.0–36.0)
MCV: 98.2 fL (ref 78.0–100.0)
PLATELETS: 220 10*3/uL (ref 150–400)
RBC: 2.71 MIL/uL — ABNORMAL LOW (ref 3.87–5.11)
RDW: 16.3 % — AB (ref 11.5–15.5)
WBC: 16.9 10*3/uL — AB (ref 4.0–10.5)

## 2014-04-25 LAB — BASIC METABOLIC PANEL
BUN: 28 mg/dL — ABNORMAL HIGH (ref 6–23)
CHLORIDE: 105 meq/L (ref 96–112)
CO2: 33 mEq/L — ABNORMAL HIGH (ref 19–32)
CREATININE: 0.62 mg/dL (ref 0.50–1.10)
Calcium: 9.1 mg/dL (ref 8.4–10.5)
GFR calc non Af Amer: 89 mL/min — ABNORMAL LOW (ref >90)
Glucose, Bld: 151 mg/dL — ABNORMAL HIGH (ref 70–99)
Potassium: 3.9 mEq/L (ref 3.7–5.3)
Sodium: 147 mEq/L (ref 137–147)

## 2014-04-25 LAB — CULTURE, BLOOD (ROUTINE X 2)

## 2014-04-25 LAB — VANCOMYCIN, TROUGH: Vancomycin Tr: 22.2 ug/mL — ABNORMAL HIGH (ref 10.0–20.0)

## 2014-04-25 NOTE — Progress Notes (Signed)
[facility]                                                                                              Progress note     Patient Demographics  Katelyn Rojas, is a 73 y.o. female  ZOX:[id number]  WJX:[id number]  DOB - [birth year]  Admit date - 03/24/2014  Admitting Physician Carron Curie, MD  Outpatient Primary MD for the patient is [name],[name], MD  LOS - 32   Chief complaint    Respiratory failure    Protein calorie malnutrition         Subjective:   Katelyn Rojas cannot give any history ;   Objective:   Vital signs  Temperature 99 Heart rate 80 Respiratory rate 18 Blood pressure 103/56 Pulse ox 96%    Exam Obtunded,  [place].AT,  Supple Neck,No JVD, No cervical lymphadenopathy appreciated.  Tracheostomy noted in midline Symmetrical Chest wall movement, decreased breath sounds bilaterally basally, RRR,No Gallops,Rubs or new Murmurs, No Parasternal Heave +ve B.Sounds, Abd protuberant, distended Non tender, No organomegaly appriciated, No rebound - guarding or rigidity. No Cyanosis, Clubbing or edema, No new Rashes or bruise     I&Os-3100/1050 Foley - yes   Data Review   CBC  Recent Labs Lab 04/21/14 0734 04/23/14 0750 04/25/14 0616  WBC 10.5 16.3* 16.9*  HGB 10.8* 8.9* 7.8*  HCT 35.6* 30.4* 26.6*  PLT 298 263 220  MCV 95.2 97.1 98.2  MCH 28.9 28.4 28.8  MCHC 30.3 29.3* 29.3*  RDW 14.9 16.4* 16.3*    Chemistries   Recent Labs Lab 04/20/14 0527 04/23/14 0750 04/24/14 0728 04/25/14 0616  NA 143 159* 154* 147  K 4.9 3.7 3.2* 3.9  CL 95* 112 110 105  CO2 40* 37* 34* 33*  GLUCOSE 147* 143* 113* 151*  BUN 38* 33* 33* 28*  CREATININE 0.45* 0.71 0.65 0.62  CALCIUM 10.0 9.9 9.2 9.1   ------------------------------------------------------------------------------------------------------------------ CrCl is unknown because both a height and weight  (above a minimum accepted value) are required for this calculation. ------------------------------------------------------------------------------------------------------------------ No results found for this basename: HGBA1C,  in the last 72 hours ------------------------------------------------------------------------------------------------------------------ No results found for this basename: CHOL, HDL, LDLCALC, TRIG, CHOLHDL, LDLDIRECT,  in the last 72 hours ------------------------------------------------------------------------------------------------------------------ No results found for this basename: TSH, T4TOTAL, FREET3, T3FREE, THYROIDAB,  in the last 72 hours ------------------------------------------------------------------------------------------------------------------ No results found for this basename: VITAMINB12, FOLATE, FERRITIN, TIBC, IRON, RETICCTPCT,  in the last 72 hours  Coagulation profile No results found for this basename: INR, PROTIME,  in the last 168 hours  No results found for this basename: DDIMER,  in the last 72 hours  Cardiac Enzymes No results found for this basename: CK, CKMB, TROPONINI, MYOGLOBIN,  in the last 168 hours ------------------------------------------------------------------------------------------------------------------ No components found with this basename: POCBNP,   Micro Results Recent Results (from the past 240 hour(s))  CULTURE, BLOOD (ROUTINE X 2)     Status: None   Collection Time    04/21/14 10:10 PM      Result Value Ref Range Status   Specimen Description BLOOD RIGHT PICC LINE   Final  Special Requests BOTTLES DRAWN AEROBIC AND ANAEROBIC 8CC   Final   Culture  Setup Time     Final   Value: 04/22/2014 04:24     [facility]   Culture     Final   Value:        BLOOD CULTURE RECEIVED NO GROWTH TO DATE CULTURE WILL BE HELD FOR 5 DAYS BEFORE ISSUING A FINAL NEGATIVE REPORT     [facility]formed [facility]   Report Status PENDING   Incomplete  CULTURE, BLOOD (ROUTINE X 2)     Status: None   Collection Time    04/21/14 10:38 PM      Result Value Ref Range Status   Specimen Description BLOOD LEFT HAND   Final   Special Requests BOTTLES DRAWN AEROBIC ONLY 5CC   Final   Culture  Setup Time     Final   Value: 04/22/2014 04:24     [facility]formed [facility]   Culture     Final   Value: METHICILLIN RESISTANT STAPHYLOCOCCUS AUREUS     Note: RIFAMPIN AND GENTAMICIN SHOULD NOT BE USED AS SINGLE DRUGS FOR TREATMENT OF STAPH INFECTIONS. CRITICAL RESULT CALLED TO, READ BACK BY AND VERIFIED WITH: CLARINDA W BY INGRAM A 04/24/14     Note: Gram Stain Report Called to,Read Back By and Verified With: [facility] BADGETT 04/23/14 AT 0235 RIDK     Performed at [facility]   Report Status 04/25/2014 FINAL   Final   Organism ID, Bacteria METHICILLIN RESISTANT STAPHYLOCOCCUS AUREUS   Final  URINE CULTURE     Status: None   Collection Time    04/22/14  1:00 AM      Result Value Ref Range Status   Specimen Description URINE, RANDOM   Final   Special Requests NONE   Final   Culture  Setup Time     Final   Value: 04/22/2014 02:53     [facility]formed [facility]   Colony Count     Final   Value: NO GROWTH     [facility]formed [facility]   Culture     Final   Value: NO GROWTH     [facility]formed at [facility]   Report Status 04/23/2014 FINAL   Final  CULTURE, RESPIRATORY (NON-EXPECTORATED)     Status: None   Collection Time    04/22/14  1:03 AM      Result Value Ref Range Status   Specimen Description TRACHEAL ASPIRATE   Final   Special Requests NONE   Final   Gram Stain     Final   Value: ABUNDANT WBC PRESENT,BOTH PMN AND MONONUCLEAR     NO SQUAMOUS EPITHELIAL CELLS SEEN     MODERATE GRAM POSITIVE COCCI     IN PAIRS IN CLUSTERS     [facility]formed at [facility]   Culture     Final   Value: ABUNDANT METHICILLIN RESISTANT STAPHYLOCOCCUS AUREUS     Note: RIFAMPIN  AND GENTAMICIN SHOULD NOT BE USED AS SINGLE DRUGS FOR TREATMENT OF STAPH INFECTIONS. CRITICAL RESULT CALLED TO, READ BACK BY AND VERIFIED WITH:  CLARINDA @910AM  6.12.15 BY OANA     [facility]formed at [facility]   Report Status 04/24/2014 FINAL   Final   Organism ID, Bacteria METHICILLIN RESISTANT STAPHYLOCOCCUS AUREUS   Final       Assessment & Plan  SIRS;  on IV vancomycin /  Respiratory  failure status post tracheostomy , ventilator dependence , not tolerating PSV trials seems that she is going to be vent dependent  Anemia, severe-status post blood transfusion PEA/cardiac arrest with left pneumothorax status post chest tube placement on the left, removed Chronic sacral decubitus ulcer with MRSA treated History of protein calorie malnutrition/dysphagia continue with tube feeding Hypertension controlled  dementia , advanced on Aricept Osteoarthritis pain control Multiple and chronic thoracic vertebral compression fractures IV fentanyl PRN Generalized weakness continue with PT/OT  Hypernatremia on D5 water Thrombocytopenia resolved Hypokalemia resolved Ileus followup KUB Bacteremia with staph aureus Sputum culture positive for MRSA  Plan  Continue vancomycin  Decreased D5 water to 50 mL per hour Abdominal x-ray in a.m. Critical care time 32 minutes Code Status: Full  DVT Prophylaxis  heparin   Carron Curie M.D on 04/25/2014 at 12:52 PM

## 2014-04-26 ENCOUNTER — Other Ambulatory Visit (HOSPITAL_COMMUNITY): Payer: Self-pay

## 2014-04-26 LAB — BASIC METABOLIC PANEL WITH GFR
BUN: 24 mg/dL — ABNORMAL HIGH (ref 6–23)
CO2: 33 meq/L — ABNORMAL HIGH (ref 19–32)
Calcium: 8.7 mg/dL (ref 8.4–10.5)
Chloride: 104 meq/L (ref 96–112)
Creatinine, Ser: 0.52 mg/dL (ref 0.50–1.10)
GFR calc Af Amer: >90 mL/min
GFR calc non Af Amer: >90 mL/min
Glucose, Bld: 110 mg/dL — ABNORMAL HIGH (ref 70–99)
Potassium: 3.4 meq/L — ABNORMAL LOW (ref 3.7–5.3)
Sodium: 146 meq/L (ref 137–147)

## 2014-04-26 NOTE — Progress Notes (Signed)
[facility]                                                                                              Progress note     Patient Demographics  Katelyn Rojas, is a 73 y.o. female  ZOX:[id number]  [id number]  DOB - [birth year]  Admit date - 03/24/2014  Admitting Physician Carron Curie, MD  Outpatient Primary MD for the patient is [name],[name], MD  LOS - 33   Chief complaint    Respiratory failure    Protein calorie malnutrition         Subjective:   Katelyn Rojas cannot give any history ;   Objective:   Vital signs  Temperature 98.3 Heart rate 99 Respiratory rate 26 Blood pressure 116/45 Pulse ox 94%    Exam Obtunded,  Katelyn Rojas,  Supple Neck,No JVD, No cervical lymphadenopathy appreciated.  Tracheostomy noted in midline Symmetrical Chest wall movement, decreased breath sounds bilaterally basally, RRR,No Gallops,Rubs or new Murmurs, No Parasternal Heave +ve B.Sounds, Abd protuberant, distended Non tender, No organomegaly appriciated, No rebound - guarding or rigidity. No Cyanosis, Clubbing or edema, No new Rashes or bruise     I&Os-2350/450 Foley - yes   Data Review   CBC  Recent Labs Lab 04/21/14 0734 04/23/14 0750 04/25/14 0616  WBC 10.5 16.3* 16.9*  HGB 10.8* 8.9* 7.8*  HCT 35.6* 30.4* 26.6*  PLT 298 263 220  MCV 95.2 97.1 98.2  MCH 28.9 28.4 28.8  MCHC 30.3 29.3* 29.3*  RDW 14.9 16.4* 16.3*    Chemistries   Recent Labs Lab 04/20/14 0527 04/23/14 0750 04/24/14 0728 04/25/14 0616 04/26/14 0830  NA 143 159* 154* 147 146  K 4.9 3.7 3.2* 3.9 3.4*  CL 95* 112 110 105 104  CO2 40* 37* 34* 33* 33*  GLUCOSE 147* 143* 113* 151* 110*  BUN 38* 33* 33* 28* 24*  CREATININE 0.45* 0.71 0.65 0.62 0.52  CALCIUM 10.0 9.9 9.2 9.1 8.7    ------------------------------------------------------------------------------------------------------------------ CrCl is unknown because both a height and weight (above a minimum accepted value) are required for this calculation. ------------------------------------------------------------------------------------------------------------------ No results found for this basename: HGBA1C,  in the last 72 hours ------------------------------------------------------------------------------------------------------------------ No results found for this basename: CHOL, HDL, LDLCALC, TRIG, CHOLHDL, LDLDIRECT,  in the last 72 hours ------------------------------------------------------------------------------------------------------------------ No results found for this basename: TSH, T4TOTAL, FREET3, T3FREE, THYROIDAB,  in the last 72 hours ------------------------------------------------------------------------------------------------------------------ No results found for this basename: VITAMINB12, FOLATE, FERRITIN, TIBC, IRON, RETICCTPCT,  in the last 72 hours  Coagulation profile No results found for this basename: INR, PROTIME,  in the last 168 hours  No results found for this basename: DDIMER,  in the last 72 hours  Cardiac Enzymes No results found for this basename: CK, CKMB, TROPONINI, MYOGLOBIN,  in the last 168 hours ------------------------------------------------------------------------------------------------------------------ No components found with this basename: POCBNP,   Micro Results Recent Results (from the past 240 hour(s))  CULTURE, BLOOD (ROUTINE X 2)     Status: None   Collection Time    04/21/14 10:10 PM      Result Value Ref Range Status   Specimen  Description BLOOD RIGHT PICC LINE   Final   Special Requests BOTTLES DRAWN AEROBIC AND ANAEROBIC 8CC   Final   Culture  Setup Time     Final   Value: 04/22/2014 04:24     Performed at [facility]   Culture      Final   Value:        BLOOD CULTURE RECEIVED NO GROWTH TO DATE CULTURE WILL BE HELD FOR 5 DAYS BEFORE ISSUING A FINAL NEGATIVE REPORT     Performed at [facility]   Report Status PENDING   Incomplete  CULTURE, BLOOD (ROUTINE X 2)     Status: None   Collection Time    04/21/14 10:38 PM      Result Value Ref Range Status   Specimen Description BLOOD LEFT HAND   Final   Special Requests BOTTLES DRAWN AEROBIC ONLY 5CC   Final   Culture  Setup Time     Final   Value: 04/22/2014 04:24     Performed at [facility]   Culture     Final   Value: METHICILLIN RESISTANT STAPHYLOCOCCUS AUREUS     Note: RIFAMPIN AND GENTAMICIN SHOULD NOT BE USED AS SINGLE DRUGS FOR TREATMENT OF STAPH INFECTIONS. CRITICAL RESULT CALLED TO, READ BACK BY AND VERIFIED WITH: CLARINDA W BY INGRAM A 04/24/14     Note: Gram Stain Report Called to,Read Back By and Verified With: [facility] BADGETT 04/23/14 AT 0235 RIDK     Performed at [facility]   Report Status 04/25/2014 FINAL   Final   Organism ID, Bacteria METHICILLIN RESISTANT STAPHYLOCOCCUS AUREUS   Final  URINE CULTURE     Status: None   Collection Time    04/22/14  1:00 AM      Result Value Ref Range Status   Specimen Description URINE, RANDOM   Final   Special Requests NONE   Final   Culture  Setup Time     Final   Value: 04/22/2014 02:53     Performed at [facility] Count     Final   Value: NO GROWTH     Performed at [facility]   Culture     Final   Value: NO GROWTH     Performed at [facility]   Report Status 04/23/2014 FINAL   Final  CULTURE, RESPIRATORY (NON-EXPECTORATED)     Status: None   Collection Time    04/22/14  1:03 AM      Result Value Ref Range Status   Specimen Description TRACHEAL ASPIRATE   Final   Special Requests NONE   Final   Gram Stain     Final   Value: ABUNDANT WBC PRESENT,BOTH PMN AND MONONUCLEAR     NO SQUAMOUS EPITHELIAL CELLS SEEN     MODERATE GRAM POSITIVE COCCI      IN PAIRS IN CLUSTERS     Performed at [facility]   Culture     Final   Value: ABUNDANT METHICILLIN RESISTANT STAPHYLOCOCCUS AUREUS     Note: RIFAMPIN AND GENTAMICIN SHOULD NOT BE USED AS SINGLE DRUGS FOR TREATMENT OF STAPH INFECTIONS. CRITICAL RESULT CALLED TO, READ BACK BY AND VERIFIED WITH:  CLARINDA @910AM  6.12.15 BY OANA     Performed at [facility]   Report Status 04/24/2014 FINAL   Final   Organism ID, Bacteria METHICILLIN RESISTANT STAPHYLOCOCCUS AUREUS   Final       Assessment &  Plan  SIRS;  on IV vancomycin  Respiratory failure status post tracheostomy , ventilator dependence , not tolerating PSV trials seems that she is going to be vent dependent  Anemia, severe-status post blood transfusion PEA/cardiac arrest with left pneumothorax status post chest tube placement on the left, removed Chronic sacral decubitus ulcer with MRSA treated History of protein calorie malnutrition/dysphagia continue with tube feeding Hypertension controlled  dementia , advanced on Aricept Osteoarthritis pain control Multiple and chronic thoracic vertebral compression fractures IV fentanyl PRN Generalized weakness continue with PT/OT  Hypernatremia on D5 water Thrombocytopenia resolved Hypokalemia resolved Ileus followup KUB Bacteremia with staph aureus Sputum culture positive for MRSA  Plan  Check KUB in a.m.  check labs in a.m.  Critical care time 31 minutes Code Status: Full  DVT Prophylaxis  heparin   Carron CurieHijazi, Maurita Havener M.D on 04/26/2014 at 2:23 PM

## 2014-04-27 ENCOUNTER — Other Ambulatory Visit (HOSPITAL_COMMUNITY): Payer: Self-pay

## 2014-04-27 LAB — CBC
HEMATOCRIT: 21.1 % — AB (ref 36.0–46.0)
Hemoglobin: 6.7 g/dL — CL (ref 12.0–15.0)
MCH: 30.9 pg (ref 26.0–34.0)
MCHC: 31.8 g/dL (ref 30.0–36.0)
MCV: 97.2 fL (ref 78.0–100.0)
PLATELETS: 208 10*3/uL (ref 150–400)
RBC: 2.17 MIL/uL — ABNORMAL LOW (ref 3.87–5.11)
RDW: 15.7 % — ABNORMAL HIGH (ref 11.5–15.5)
WBC: 12.4 10*3/uL — ABNORMAL HIGH (ref 4.0–10.5)

## 2014-04-27 LAB — BASIC METABOLIC PANEL
BUN: 22 mg/dL (ref 6–23)
CO2: 30 mEq/L (ref 19–32)
Calcium: 7.7 mg/dL — ABNORMAL LOW (ref 8.4–10.5)
Chloride: 95 mEq/L — ABNORMAL LOW (ref 96–112)
Creatinine, Ser: 0.49 mg/dL — ABNORMAL LOW (ref 0.50–1.10)
GFR calc Af Amer: >90 mL/min (ref >90)
Glucose, Bld: 452 mg/dL — ABNORMAL HIGH (ref 70–99)
POTASSIUM: 3.4 meq/L — AB (ref 3.7–5.3)
Sodium: 133 mEq/L — ABNORMAL LOW (ref 137–147)

## 2014-04-27 LAB — VANCOMYCIN, TROUGH: Vancomycin Tr: 14.3 ug/mL (ref 10.0–20.0)

## 2014-04-27 LAB — PREPARE RBC (CROSSMATCH)

## 2014-04-27 LAB — OCCULT BLOOD X 1 CARD TO LAB, STOOL: FECAL OCCULT BLD: NEGATIVE

## 2014-04-27 NOTE — Progress Notes (Signed)
PULMONARY / CRITICAL CARE MEDICINE  Name: Katelyn Rojas MRN: [id number] DOB: 02/14/1941    ADMISSION DATE:  03/24/2014 CONSULTATION DATE:  03/26/2014  REFERRING MD :  Dr. 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  Family wants full code status including trach/peg. Course complicated by Left Chest tube by [facility] for NGT related left ptx   INTERVAL HISTORY: Failing weaning trials.  VITAL SIGNS:Vital signs reviewed.   PHYSICAL EXAMINATION: General:   Cachectic, severe kyphoscoliosis.  Neuro:  Awake, alert, follows commands, tracks and follows at times. Contracted  HEENT:  Tracheostomy site intact, no hemorrhage Neck:  No JVD Cardiovascular:  Regular, no murmurs Lungs:  Bilateral diminished air entry. Left CT site dressing intact .  Abdomen:  Soft, non tender Musculoskeletal:  No edema Skin:  Sacral decub with wound vac  LABS: CBC  Recent Labs Lab 04/23/14 0750 04/25/14 0616 04/27/14 0530  WBC 16.3* 16.9* 12.4*  HGB 8.9* 7.8* 6.7*  HCT 30.4* 26.6* 21.1*  PLT 263 220 208   Coag's No results found for this basename: APTT, INR,  in the last 168 hours BMET  Recent Labs Lab 04/25/14 0616 04/26/14 0830 04/27/14 0530  NA 147 146 133*  K 3.9 3.4* 3.4*  CL 105 104 95*  CO2 33* 33* 30  BUN 28* 24* 22  CREATININE 0.62 0.52 0.49*  GLUCOSE 151* 110* 452*   Electrolytes  Recent Labs Lab 04/25/14 0616 04/26/14 0830 04/27/14 0530  CALCIUM 9.1 8.7 7.7*   CXR - 6/15: mild improvement in aeration of R base, emphysema  ASSESSMENT / PLAN:  Resolved problem list PEA arrest 5/24 due Left pneumothorax after enteric feeding tube placed in airway (CT removed 5/28) Acute blood loss anemia Acute on chronic respiratory failure AECOPD   Active problem list (updated  6/15) Chronic respiratory failure/ failure to wean  Tracheostomy status End-stage COPD Severe malnutrition / cachexia  Chronic sacral decub Dementia  Dysphagia  Hypokalemia Anemia of critical illness  Ileus 6/10 Failure to Thrive  Discussion Prognosis not good Apparently,Family will accept no alternative to taking the patient home, want to take her home with home vent  Pulmonary Recommendations  Tolerated some PS weaning but not likely to be liberated from vent.  Reduce PSV as tolerated to 12 from 16 today Home vent training to begin at some point although this IS not safe discharge & this pt will likely end up back in the hospital or expire at home Will need to establish pulmonary f/u to assist w/ home vent requirements.  Consider palliative care consult: continued support is medically ineffective No clear source of hgb drop, monitor for bleeding, repeat cbc K replacement per primary MD  I have fully examined this patient and agree with above findings.    Mcarthur Rossetti. Tyson Alias, MD, FACP Pgr: [phone] [facility]

## 2014-04-28 LAB — TYPE AND SCREEN
ABO/RH(D): A POS
Antibody Screen: NEGATIVE
UNIT DIVISION: 0
Unit division: 0

## 2014-04-28 LAB — CULTURE, BLOOD (ROUTINE X 2): CULTURE: NO GROWTH

## 2014-04-28 LAB — VANCOMYCIN, TROUGH: Vancomycin Tr: 9.1 ug/mL — ABNORMAL LOW (ref 10.0–20.0)

## 2014-04-28 LAB — CLOSTRIDIUM DIFFICILE BY PCR: CDIFFPCR: NEGATIVE

## 2014-04-29 ENCOUNTER — Other Ambulatory Visit (HOSPITAL_COMMUNITY): Payer: Self-pay

## 2014-04-29 MED ORDER — IOHEXOL 300 MG/ML  SOLN
50.0000 mL | Freq: Once | INTRAMUSCULAR | Status: AC | PRN
  Administered 2014-04-29: 35 mL via ORAL

## 2014-04-30 LAB — POTASSIUM: POTASSIUM: 3.7 meq/L (ref 3.7–5.3)

## 2014-04-30 LAB — CBC
HCT: 34.2 % — ABNORMAL LOW (ref 36.0–46.0)
Hemoglobin: 10.9 g/dL — ABNORMAL LOW (ref 12.0–15.0)
MCH: 30 pg (ref 26.0–34.0)
MCHC: 31.9 g/dL (ref 30.0–36.0)
MCV: 94.2 fL (ref 78.0–100.0)
PLATELETS: 214 10*3/uL (ref 150–400)
RBC: 3.63 MIL/uL — AB (ref 3.87–5.11)
RDW: 16.4 % — AB (ref 11.5–15.5)
WBC: 15.2 10*3/uL — AB (ref 4.0–10.5)

## 2014-05-01 LAB — BASIC METABOLIC PANEL
BUN: 23 mg/dL (ref 6–23)
CHLORIDE: 100 meq/L (ref 96–112)
CO2: 37 mEq/L — ABNORMAL HIGH (ref 19–32)
CREATININE: 0.48 mg/dL — AB (ref 0.50–1.10)
Calcium: 8.9 mg/dL (ref 8.4–10.5)
GFR calc Af Amer: >90 mL/min (ref >90)
GFR calc non Af Amer: >90 mL/min (ref >90)
Glucose, Bld: 164 mg/dL — ABNORMAL HIGH (ref 70–99)
POTASSIUM: 3.9 meq/L (ref 3.7–5.3)
Sodium: 142 mEq/L (ref 137–147)

## 2014-05-01 LAB — VANCOMYCIN, TROUGH: VANCOMYCIN TR: 17 ug/mL (ref 10.0–20.0)

## 2014-05-03 LAB — CBC WITH DIFFERENTIAL/PLATELET
BASOS ABS: 0.1 10*3/uL (ref 0.0–0.1)
Basophils Relative: 1 % (ref 0–1)
EOS ABS: 0.2 10*3/uL (ref 0.0–0.7)
Eosinophils Relative: 2 % (ref 0–5)
HCT: 32.4 % — ABNORMAL LOW (ref 36.0–46.0)
Hemoglobin: 10 g/dL — ABNORMAL LOW (ref 12.0–15.0)
LYMPHS ABS: 0.5 10*3/uL — AB (ref 0.7–4.0)
LYMPHS PCT: 6 % — AB (ref 12–46)
MCH: 30.8 pg (ref 26.0–34.0)
MCHC: 30.9 g/dL (ref 30.0–36.0)
MCV: 99.7 fL (ref 78.0–100.0)
Monocytes Absolute: 1.2 10*3/uL — ABNORMAL HIGH (ref 0.1–1.0)
Monocytes Relative: 14 % — ABNORMAL HIGH (ref 3–12)
NEUTROS ABS: 6.4 10*3/uL (ref 1.7–7.7)
Neutrophils Relative %: 77 % (ref 43–77)
Platelets: 302 10*3/uL (ref 150–400)
RBC: 3.25 MIL/uL — ABNORMAL LOW (ref 3.87–5.11)
RDW: 17.2 % — AB (ref 11.5–15.5)
WBC Morphology: INCREASED
WBC: 8.4 10*3/uL (ref 4.0–10.5)

## 2014-05-03 LAB — CK TOTAL AND CKMB (NOT AT ARMC)
CK, MB: 2.1 ng/mL (ref 0.3–4.0)
CK, MB: 2.8 ng/mL (ref 0.3–4.0)
Relative Index: INVALID (ref 0.0–2.5)
Relative Index: INVALID (ref 0.0–2.5)
Total CK: 36 U/L (ref 7–177)
Total CK: 42 U/L (ref 7–177)

## 2014-05-03 LAB — BASIC METABOLIC PANEL
BUN: 30 mg/dL — AB (ref 6–23)
CO2: 41 mEq/L (ref 19–32)
CREATININE: 0.41 mg/dL — AB (ref 0.50–1.10)
Calcium: 9.4 mg/dL (ref 8.4–10.5)
Chloride: 100 mEq/L (ref 96–112)
GFR calc Af Amer: >90 mL/min (ref >90)
GFR calc non Af Amer: >90 mL/min (ref >90)
Glucose, Bld: 126 mg/dL — ABNORMAL HIGH (ref 70–99)
POTASSIUM: 4 meq/L (ref 3.7–5.3)
Sodium: 142 mEq/L (ref 137–147)

## 2014-05-03 LAB — CULTURE, BLOOD (ROUTINE X 2)

## 2014-05-03 LAB — MAGNESIUM: Magnesium: 1.9 mg/dL (ref 1.5–2.5)

## 2014-05-04 ENCOUNTER — Other Ambulatory Visit (HOSPITAL_COMMUNITY): Payer: Self-pay

## 2014-05-04 NOTE — Progress Notes (Signed)
[facility]                                                                                              Progress note     Patient Demographics  Katelyn Rojas, is a 73 y.o. female  VHQ:[id number]  [id number]  DOB - 1941/06/12  Admit date - 03/24/2014  Admitting Physician Carron Curie, MD  Outpatient Primary MD for the patient is [name],[name], MD  LOS - 41   Chief complaint    Respiratory failure    Protein calorie malnutrition         Subjective:   [name] denies chest pains, shortness of breath abdominal pain or diarrhea  Objective:   Vital signs  Temperature 98.8 Heart rate 86 Respiratory rate 18 Blood pressure 128/54 Pulse ox 94%    Exam Alert awake, confused, laying down in a fetal position  [place].AT,  Supple Neck,No JVD, No cervical lymphadenopathy appreciated.  Tracheostomy noted in midline Symmetrical Chest wall movement, decreased breath sounds bilaterally basally, RRR,No Gallops,Rubs or new Murmurs, No Parasternal Heave +ve B.Sounds, Abd protuberant, distended Non tender, No organomegaly appriciated, No rebound - guarding or rigidity. No Cyanosis, Clubbing or edema, No new Rashes or bruise     I&Os-unknown Foley - yes   Data Review   CBC  Recent Labs Lab 04/30/14 0500 05/03/14 0245  WBC 15.2* 8.4  HGB 10.9* 10.0*  HCT 34.2* 32.4*  PLT 214 302  MCV 94.2 99.7  MCH 30.0 30.8  MCHC 31.9 30.9  RDW 16.4* 17.2*  LYMPHSABS  --  0.5*  MONOABS  --  1.2*  EOSABS  --  0.2  BASOSABS  --  0.1    Chemistries   Recent Labs Lab 04/30/14 0500 05/01/14 0659 05/03/14 0245  NA  --  142 142  K 3.7 3.9 4.0  CL  --  100 100  CO2  --  37* 41*  GLUCOSE  --  164* 126*  BUN  --  23 30*  CREATININE  --  0.48* 0.41*  CALCIUM  --  8.9 9.4  MG  --   --  1.9    ------------------------------------------------------------------------------------------------------------------ CrCl is unknown because both a height and weight (above a minimum accepted value) are required for this calculation. ------------------------------------------------------------------------------------------------------------------ No results found for this basename: HGBA1C,  in the last 72 hours ------------------------------------------------------------------------------------------------------------------ No results found for this basename: CHOL, HDL, LDLCALC, TRIG, CHOLHDL, LDLDIRECT,  in the last 72 hours ------------------------------------------------------------------------------------------------------------------ No results found for this basename: TSH, T4TOTAL, FREET3, T3FREE, THYROIDAB,  in the last 72 hours ------------------------------------------------------------------------------------------------------------------ No results found for this basename: VITAMINB12, FOLATE, FERRITIN, TIBC, IRON, RETICCTPCT,  in the last 72 hours  Coagulation profile No results found for this basename: INR, PROTIME,  in the last 168 hours  No results found for this basename: DDIMER,  in the last 72 hours  Cardiac Enzymes  Recent Labs Lab 05/03/14 0245 05/03/14 0900  CKMB 2.1 2.8   ------------------------------------------------------------------------------------------------------------------ No components found with this basename: POCBNP,   Micro Results Recent Results (from the past 240 hour(s))  CLOSTRIDIUM DIFFICILE BY PCR  Status: None   Collection Time    04/27/14  4:18 PM      Result Value Ref Range Status   C difficile by pcr NEGATIVE  NEGATIVE Final  CULTURE, BLOOD (ROUTINE X 2)     Status: None   Collection Time    04/30/14  2:20 PM      Result Value Ref Range Status   Specimen Description BLOOD LEFT HAND   Final   Special Requests BOTTLES DRAWN  AEROBIC ONLY 4CC   Final   Culture  Setup Time     Final   Value: 04/30/2014 19:09     [facility]formed [facility]   Culture     Final   Value: ENTEROCOCCUS SPECIES     Note: COMBINATION THERAPY OF HIGH DOSE AMPICILLIN OR VANCOMYCIN, PLUS AN AMINOGLYCOSIDE, IS USUALLY INDICATED FOR SERIOUS ENTEROCOCCAL INFECTIONS.     Note: Gram Stain Report Called to,Read Back By and Verified With: NANCY S@12 :43PM ON 05/01/14 BY DANTS     Performed at [facility]   Report Status 05/03/2014 FINAL   Final   Organism ID, Bacteria ENTEROCOCCUS SPECIES   Final  CULTURE, BLOOD (ROUTINE X 2)     Status: None   Collection Time    04/30/14  2:25 PM      Result Value Ref Range Status   Specimen Description BLOOD RIGHT HAND   Final   Special Requests BOTTLES DRAWN AEROBIC ONLY 3CC   Final   Culture  Setup Time     Final   Value: 04/30/2014 19:10     Performed at [facility]   Culture     Final   Value:        BLOOD CULTURE RECEIVED NO GROWTH TO DATE CULTURE WILL BE HELD FOR 5 DAYS BEFORE ISSUING A FINAL NEGATIVE REPORT     Performed at [facility]   Report Status PENDING   Incomplete  CULTURE, BLOOD (ROUTINE X 2)     Status: None   Collection Time    05/01/14  2:35 PM      Result Value Ref Range Status   Specimen Description BLOOD RIGHT HAND   Final   Special Requests     Final   Value: BOTTLES DRAWN AEROBIC AND ANAEROBIC 10CC AER,5CC ANA   Culture  Setup Time     Final   Value: 05/01/2014 22:00     Performed at [facility]   Culture     Final   Value:        BLOOD CULTURE RECEIVED NO GROWTH TO DATE CULTURE WILL BE HELD FOR 5 DAYS BEFORE ISSUING A FINAL NEGATIVE REPORT     Performed at [facility]   Report Status PENDING   Incomplete  CULTURE, BLOOD (ROUTINE X 2)     Status: None   Collection Time    05/01/14  2:55 PM      Result Value Ref Range Status   Specimen Description BLOOD LEFT HAND   Final   Special Requests BOTTLES DRAWN AEROBIC AND  ANAEROBIC 10CC   Final   Culture  Setup Time     Final   Value: 05/01/2014 22:00     Performed at [facility]   Culture     Final   Value:        BLOOD CULTURE RECEIVED NO GROWTH TO DATE CULTURE WILL BE HELD FOR 5 DAYS BEFORE ISSUING A FINAL NEGATIVE REPORT  Performed at [facility]   Report Status PENDING   Incomplete       Assessment & Plan  SIRS;  on IV vancomycin  Respiratory failure status post tracheostomy , ventilator dependence , continue with PSV trials last chest x-ray shows bilateral basal atelectasis versus infiltrates Anemia, severe-status post blood transfusion PEA/cardiac arrest with left pneumothorax status post chest tube placement on the left, removed Chronic sacral decubitus ulcer with MRSA treated History of protein calorie malnutrition/dysphagia continue with tube feeding Hypertension controlled Dementia , advanced on Aricept Osteoarthritis pain control Multiple and chronic thoracic vertebral compression fractures IV fentanyl PRN Generalized weakness continue with PT/OT  Hypernatremia on D5 water Thrombocytopenia resolved Hypokalemia resolved Ileus followup KUB Bacteremia with staph aureus Sputum culture positive for MRSA  Plan  Nebulizer treatment 3 times a day for atelectasis  Code Status: Full  DVT Prophylaxis  heparin   Carron Curie M.D on 05/04/2014 at 2:42 PM

## 2014-05-04 NOTE — Progress Notes (Signed)
PULMONARY / CRITICAL CARE MEDICINE  Name: Katelyn Rojas MRN: [id number] DOB: 30-Apr-1941    ADMISSION DATE:  03/24/2014 CONSULTATION DATE:  03/26/2014  REFERRING MD :  Dr. Sharyon Medicus  CHIEF COMPLAINT:  Acute on chronic respiratory failure  BRIEF PATIENT DESCRIPTION: 73 y/o ES-COPD female with failure to thrive (88 lbs) who was intubated multiple times in an outside facility presenting to [facility] for rehab.  Family wants full code status including trach/peg.  Course complicated by Left Chest tube by [facility] for NGT related left ptx   INTERVAL HISTORY: Failing weaning trials.  VITAL SIGNS:  Vital signs reviewed.   PHYSICAL EXAMINATION: General:   Cachectic, severe kyphoscoliosis.  Neuro:  Awake, alert, follows commands, tracks and follows at times. Contracted  HEENT:  Tracheostomy site intact, no hemorrhage Neck:  No JVD Cardiovascular:  Regular, no murmurs Lungs:  Bilateral diminished air entry.   Abdomen:  Soft, non tender Musculoskeletal:  No edema Skin:  Sacral decub with wound vac  LABS: CBC  Recent Labs Lab 04/30/14 0500 05/03/14 0245  WBC 15.2* 8.4  HGB 10.9* 10.0*  HCT 34.2* 32.4*  PLT 214 302   BMET  Recent Labs Lab 04/30/14 0500 05/01/14 0659 05/03/14 0245  NA  --  142 142  K 3.7 3.9 4.0  CL  --  100 100  CO2  --  37* 41*  BUN  --  23 30*  CREATININE  --  0.48* 0.41*  GLUCOSE  --  164* 126*   Electrolytes  Recent Labs Lab 05/01/14 0659 05/03/14 0245  CALCIUM 8.9 9.4  MG  --  1.9   CXR - 6/22: trach in good position, no PTX, bibasilar atx / infiltrate  ASSESSMENT / PLAN:  Resolved problem list PEA arrest 5/24 due Left pneumothorax after enteric feeding tube placed in airway (CT removed 5/28) Acute blood loss anemia Acute on chronic respiratory failure AECOPD   Active problem list  Chronic respiratory failure/ failure to wean  Tracheostomy status End-stage COPD Severe malnutrition / cachexia  Chronic sacral decub Dementia  Dysphagia   Hypokalemia Anemia of critical illness  Ileus 6/10 Failure to Thrive  Discussion Prognosis not good Apparently, Family will accept no alternative to taking the patient home, want to take her home with home vent  Pulmonary Recommendations  -Has tolerated some PS weaning but not likely to be liberated from vent.   -PSV as tolerated  -Home vent training to begin at some point although this is not a safe discharge & this pt will likely end up back in the hospital or expire at home -Consider palliative care consult: continued support is medically ineffective -No clear source of hgb drop, monitor for bleeding, trend cbc    Canary Brim, NP-C [facility] Pgr: [phone] or [phone]   PCCM ATTENDING: I have interviewed and examined the patient and reviewed the database. I have formulated the assessment and plan as reflected in the note above with amendments made by me.   Prognosis poor. Pt's with ES COPD do not typically thrive on long term ventilation and often suffer significant discomfort. Add to this her co-morbidities and it seems clear that her chance for an outcome that anyone would celebrate as a success is nil. Comfort care would be a reasonable alternative. Encourage Palliative Care involvement if they have not already been consulted  Billy Fischer, MD;  PCCM service; Mobile [phone]

## 2014-05-05 LAB — VANCOMYCIN, TROUGH: Vancomycin Tr: 15 ug/mL (ref 10.0–20.0)

## 2014-05-05 NOTE — Progress Notes (Signed)
[facility]                                                                                              Progress note     Patient Demographics  Katelyn BruinDelsenia Rojas, is a 73 y.o. female  ZOX:[id number]  [id number]  DOB - 12/11/1940  Admit date - 03/24/2014  Admitting Physician Carron CurieAli Hijazi, MD  Outpatient Primary MD for the patient is [name],[name], MD  LOS - 42   Chief complaint    Respiratory failure    Protein calorie malnutrition         Subjective:   Katelyn Bruinelsenia Rojas denies chest pains, shortness of breath   Objective:   Vital signs  Temperature 98. Heart rate is Respiratory rate 16 Blood pressure 108/54 Pulse ox 97%    Exam Alert awake, confused, laying down in a fetal position  [place].AT,  Supple Neck,No JVD, No cervical lymphadenopathy appreciated.  Tracheostomy noted in midline Symmetrical Chest wall movement, decreased breath sounds bilaterally basally, RRR,No Gallops,Rubs or new Murmurs, No Parasternal Heave +ve B.Sounds, Abd protuberant, distended Non tender, No organomegaly appriciated, No rebound - guarding or rigidity. No Cyanosis, Clubbing or edema, No new Rashes or bruise     I&Os-827 Foley - yes   Data Review   CBC  Recent Labs Lab 04/30/14 0500 05/03/14 0245  WBC 15.2* 8.4  HGB 10.9* 10.0*  HCT 34.2* 32.4*  PLT 214 302  MCV 94.2 99.7  MCH 30.0 30.8  MCHC 31.9 30.9  RDW 16.4* 17.2*  LYMPHSABS  --  0.5*  MONOABS  --  1.2*  EOSABS  --  0.2  BASOSABS  --  0.1    Chemistries   Recent Labs Lab 04/30/14 0500 05/01/14 0659 05/03/14 0245  NA  --  142 142  K 3.7 3.9 4.0  CL  --  100 100  CO2  --  37* 41*  GLUCOSE  --  164* 126*  BUN  --  23 30*  CREATININE  --  0.48* 0.41*  CALCIUM  --  8.9 9.4  MG  --   --  1.9   ------------------------------------------------------------------------------------------------------------------ CrCl is  unknown because both a height and weight (above a minimum accepted value) are required for this calculation. ------------------------------------------------------------------------------------------------------------------ No results found for this basename: HGBA1C,  in the last 72 hours ------------------------------------------------------------------------------------------------------------------ No results found for this basename: CHOL, HDL, LDLCALC, TRIG, CHOLHDL, LDLDIRECT,  in the last 72 hours ------------------------------------------------------------------------------------------------------------------ No results found for this basename: TSH, T4TOTAL, FREET3, T3FREE, THYROIDAB,  in the last 72 hours ------------------------------------------------------------------------------------------------------------------ No results found for this basename: VITAMINB12, FOLATE, FERRITIN, TIBC, IRON, RETICCTPCT,  in the last 72 hours  Coagulation profile No results found for this basename: INR, PROTIME,  in the last 168 hours  No results found for this basename: DDIMER,  in the last 72 hours  Cardiac Enzymes  Recent Labs Lab 05/03/14 0245 05/03/14 0900  CKMB 2.1 2.8   ------------------------------------------------------------------------------------------------------------------ No components found with this basename: POCBNP,   Micro Results Recent Results (from the past 240 hour(s))  CLOSTRIDIUM DIFFICILE BY PCR     Status: None  Collection Time    04/27/14  4:18 PM      Result Value Ref Range Status   C difficile by pcr NEGATIVE  NEGATIVE Final  CULTURE, BLOOD (ROUTINE X 2)     Status: None   Collection Time    04/30/14  2:20 PM      Result Value Ref Range Status   Specimen Description BLOOD LEFT HAND   Final   Special Requests BOTTLES DRAWN AEROBIC ONLY 4CC   Final   Culture  Setup Time     Final   Value: 04/30/2014 19:09     Performed at [facility]    Culture     Final   Value: ENTEROCOCCUS SPECIES     Note: COMBINATION THERAPY OF HIGH DOSE AMPICILLIN OR VANCOMYCIN, PLUS AN AMINOGLYCOSIDE, IS USUALLY INDICATED FOR SERIOUS ENTEROCOCCAL INFECTIONS.     Note: Gram Stain Report Called to,Read Back By and Verified With: NANCY S@12 :43PM ON 05/01/14 BY DANTS     Performed at [facility]   Report Status 05/03/2014 FINAL   Final   Organism ID, Bacteria ENTEROCOCCUS SPECIES   Final  CULTURE, BLOOD (ROUTINE X 2)     Status: None   Collection Time    04/30/14  2:25 PM      Result Value Ref Range Status   Specimen Description BLOOD RIGHT HAND   Final   Special Requests BOTTLES DRAWN AEROBIC ONLY 3CC   Final   Culture  Setup Time     Final   Value: 04/30/2014 19:10     Performed at [facility]   Culture     Final   Value:        BLOOD CULTURE RECEIVED NO GROWTH TO DATE CULTURE WILL BE HELD FOR 5 DAYS BEFORE ISSUING A FINAL NEGATIVE REPORT     Performed at [facility]   Report Status PENDING   Incomplete  CULTURE, BLOOD (ROUTINE X 2)     Status: None   Collection Time    05/01/14  2:35 PM      Result Value Ref Range Status   Specimen Description BLOOD RIGHT HAND   Final   Special Requests     Final   Value: BOTTLES DRAWN AEROBIC AND ANAEROBIC 10CC AER,5CC ANA   Culture  Setup Time     Final   Value: 05/01/2014 22:00     Performed at [facility]   Culture     Final   Value:        BLOOD CULTURE RECEIVED NO GROWTH TO DATE CULTURE WILL BE HELD FOR 5 DAYS BEFORE ISSUING A FINAL NEGATIVE REPORT     Performed at [facility]   Report Status PENDING   Incomplete  CULTURE, BLOOD (ROUTINE X 2)     Status: None   Collection Time    05/01/14  2:55 PM      Result Value Ref Range Status   Specimen Description BLOOD LEFT HAND   Final   Special Requests BOTTLES DRAWN AEROBIC AND ANAEROBIC 10CC   Final   Culture  Setup Time     Final   Value: 05/01/2014 22:00     Performed at [facility]   Culture      Final   Value:        BLOOD CULTURE RECEIVED NO GROWTH TO DATE CULTURE WILL BE HELD FOR 5 DAYS BEFORE ISSUING A FINAL NEGATIVE REPORT     Performed at [facility]   Report Status PENDING   Incomplete       Assessment & Plan  SIRS;  on IV vancomycin  Respiratory failure status post tracheostomy , ventilator dependence , continue with PSV trials last chest x-ray shows bilateral basal atelectasis versus infiltrates Anemia, severe-status post blood transfusion PEA/cardiac arrest with left pneumothorax status post chest tube placement on the left, removed Chronic sacral decubitus ulcer with MRSA treated History of protein calorie malnutrition/dysphagia continue with tube feeding Hypertension controlled Dementia , advanced on Aricept Osteoarthritis pain control Multiple and chronic thoracic vertebral compression fractures IV fentanyl PRN Generalized weakness continue with PT/OT  Hypernatremia on D5 water Thrombocytopenia resolved Hypokalemia resolved Ileus followup KUB Bacteremia with staph aureus Sputum culture positive for MRSA  Plan  Continue same treatment Family has unrealistic expectations Check labs chest x-ray in a.m. Code Status: Full  DVT Prophylaxis  heparin   Carron Curie M.D on 05/05/2014 at 1:21 PM

## 2014-05-06 ENCOUNTER — Other Ambulatory Visit (HOSPITAL_COMMUNITY): Payer: Self-pay

## 2014-05-06 LAB — BASIC METABOLIC PANEL
BUN: 39 mg/dL — AB (ref 6–23)
CHLORIDE: 101 meq/L (ref 96–112)
CO2: 44 meq/L — AB (ref 19–32)
Calcium: 9.7 mg/dL (ref 8.4–10.5)
Creatinine, Ser: 0.45 mg/dL — ABNORMAL LOW (ref 0.50–1.10)
GFR calc Af Amer: >90 mL/min (ref >90)
GFR calc non Af Amer: >90 mL/min (ref >90)
Glucose, Bld: 185 mg/dL — ABNORMAL HIGH (ref 70–99)
POTASSIUM: 5.4 meq/L — AB (ref 3.7–5.3)
Sodium: 149 mEq/L — ABNORMAL HIGH (ref 137–147)

## 2014-05-06 LAB — CBC
HCT: 35.1 % — ABNORMAL LOW (ref 36.0–46.0)
HEMOGLOBIN: 10.2 g/dL — AB (ref 12.0–15.0)
MCH: 30.3 pg (ref 26.0–34.0)
MCHC: 29.1 g/dL — AB (ref 30.0–36.0)
MCV: 104.2 fL — ABNORMAL HIGH (ref 78.0–100.0)
Platelets: 367 10*3/uL (ref 150–400)
RBC: 3.37 MIL/uL — ABNORMAL LOW (ref 3.87–5.11)
RDW: 17.4 % — ABNORMAL HIGH (ref 11.5–15.5)
WBC: 9.4 10*3/uL (ref 4.0–10.5)

## 2014-05-06 LAB — CULTURE, BLOOD (ROUTINE X 2): Culture: NO GROWTH

## 2014-05-06 NOTE — Progress Notes (Signed)
[facility]                                                                                              Progress note     Patient Demographics  Katelyn Rojas, is a 73 y.o. female  PYP:[id number]  [id number]  DOB - February 08, 1941  Admit date - 03/24/2014  Admitting Physician Carron Curie, MD  Outpatient Primary MD for the patient is [name],[name], MD  LOS - 43   Chief complaint    Respiratory failure    Protein calorie malnutrition         Subjective:   Katelyn Rojas denies chest pains, shortness of breath   Objective:   Vital signs  Temperature 98. Heart rate is Respiratory rate 16 Blood pressure 108/54 Pulse ox 97%    Exam Alert awake, confused, laying down in a fetal position  [place].AT,  Supple Neck,No JVD, No cervical lymphadenopathy appreciated.  Tracheostomy noted in midline Symmetrical Chest wall movement, decreased breath sounds bilaterally basally, RRR,No Gallops,Rubs or new Murmurs, No Parasternal Heave +ve B.Sounds, Abd protuberant, distended Non tender, No organomegaly appriciated, No rebound - guarding or rigidity. No Cyanosis, Clubbing or edema, No new Rashes or bruise     I&Os-827 Foley - yes   Data Review   CBC  Recent Labs Lab 04/30/14 0500 05/03/14 0245 05/06/14 0545  WBC 15.2* 8.4 9.4  HGB 10.9* 10.0* 10.2*  HCT 34.2* 32.4* 35.1*  PLT 214 302 367  MCV 94.2 99.7 104.2*  MCH 30.0 30.8 30.3  MCHC 31.9 30.9 29.1*  RDW 16.4* 17.2* 17.4*  LYMPHSABS  --  0.5*  --   MONOABS  --  1.2*  --   EOSABS  --  0.2  --   BASOSABS  --  0.1  --     Chemistries   Recent Labs Lab 04/30/14 0500 05/01/14 0659 05/03/14 0245 05/06/14 0545  NA  --  142 142 149*  K 3.7 3.9 4.0 5.4*  CL  --  100 100 101  CO2  --  37* 41* 44*  GLUCOSE  --  164* 126* 185*  BUN  --  23 30* 39*  CREATININE  --  0.48* 0.41* 0.45*  CALCIUM  --  8.9 9.4 9.7  MG  --   --   1.9  --    ------------------------------------------------------------------------------------------------------------------ CrCl is unknown because both a height and weight (above a minimum accepted value) are required for this calculation. ------------------------------------------------------------------------------------------------------------------ No results found for this basename: HGBA1C,  in the last 72 hours ------------------------------------------------------------------------------------------------------------------ No results found for this basename: CHOL, HDL, LDLCALC, TRIG, CHOLHDL, LDLDIRECT,  in the last 72 hours ------------------------------------------------------------------------------------------------------------------ No results found for this basename: TSH, T4TOTAL, FREET3, T3FREE, THYROIDAB,  in the last 72 hours ------------------------------------------------------------------------------------------------------------------ No results found for this basename: VITAMINB12, FOLATE, FERRITIN, TIBC, IRON, RETICCTPCT,  in the last 72 hours  Coagulation profile No results found for this basename: INR, PROTIME,  in the last 168 hours  No results found for this basename: DDIMER,  in the last 72 hours  Cardiac Enzymes  Recent Labs Lab 05/03/14 0245 05/03/14 0900  CKMB 2.1 2.8   ------------------------------------------------------------------------------------------------------------------ No components found with this basename: POCBNP,   Micro Results Recent Results (from the past 240 hour(s))  CLOSTRIDIUM DIFFICILE BY PCR     Status: None   Collection Time    04/27/14  4:18 PM      Result Value Ref Range Status   C difficile by pcr NEGATIVE  NEGATIVE Final  CULTURE, BLOOD (ROUTINE X 2)     Status: None   Collection Time    04/30/14  2:20 PM      Result Value Ref Range Status   Specimen Description BLOOD LEFT HAND   Final   Special Requests BOTTLES  DRAWN AEROBIC ONLY 4CC   Final   Culture  Setup Time     Final   Value: 04/30/2014 19:09     Performed at [facility]   Culture     Final   Value: ENTEROCOCCUS SPECIES     Note: COMBINATION THERAPY OF HIGH DOSE AMPICILLIN OR VANCOMYCIN, PLUS AN AMINOGLYCOSIDE, IS USUALLY INDICATED FOR SERIOUS ENTEROCOCCAL INFECTIONS.     Note: Gram Stain Report Called to,Read Back By and Verified With: NANCY S@12 :43PM ON 05/01/14 BY DANTS     Performed at [facility]   Report Status 05/03/2014 FINAL   Final   Organism ID, Bacteria ENTEROCOCCUS SPECIES   Final  CULTURE, BLOOD (ROUTINE X 2)     Status: None   Collection Time    04/30/14  2:25 PM      Result Value Ref Range Status   Specimen Description BLOOD RIGHT HAND   Final   Special Requests BOTTLES DRAWN AEROBIC ONLY 3CC   Final   Culture  Setup Time     Final   Value: 04/30/2014 19:10     Performed at [facility]   Culture     Final   Value: NO GROWTH 5 DAYS     Performed at [facility]   Report Status 05/06/2014 FINAL   Final  CULTURE, BLOOD (ROUTINE X 2)     Status: None   Collection Time    05/01/14  2:35 PM      Result Value Ref Range Status   Specimen Description BLOOD RIGHT HAND   Final   Special Requests     Final   Value: BOTTLES DRAWN AEROBIC AND ANAEROBIC 10CC AER,5CC ANA   Culture  Setup Time     Final   Value: 05/01/2014 22:00     Performed at [facility]   Culture     Final   Value:        BLOOD CULTURE RECEIVED NO GROWTH TO DATE CULTURE WILL BE HELD FOR 5 DAYS BEFORE ISSUING A FINAL NEGATIVE REPORT     Performed at [facility]   Report Status PENDING   Incomplete  CULTURE, BLOOD (ROUTINE X 2)     Status: None   Collection Time    05/01/14  2:55 PM      Result Value Ref Range Status   Specimen Description BLOOD LEFT HAND   Final   Special Requests BOTTLES DRAWN AEROBIC AND ANAEROBIC 10CC   Final   Culture  Setup Time     Final   Value: 05/01/2014 22:00     Performed at  [facility]   Culture     Final   Value:        BLOOD CULTURE RECEIVED NO GROWTH TO DATE CULTURE WILL BE HELD FOR  5 DAYS BEFORE ISSUING A FINAL NEGATIVE REPORT     [facility]   Report Status PENDING   Incomplete       Assessment & Plan  SIRS;  on IV vancomycin  Respiratory failure status post tracheostomy , ventilator dependence , continue with PSV trials last chest x-ray shows bilateral basal atelectasis versus infiltrates Anemia, severe-status post blood transfusion PEA/cardiac arrest with left pneumothorax status post chest tube placement on the left, removed Chronic sacral decubitus ulcer with MRSA treated History of protein calorie malnutrition/dysphagia continue with tube feeding Hypertension controlled Dementia , advanced on Aricept Osteoarthritis pain control Multiple and chronic thoracic vertebral compression fractures IV fentanyl PRN Generalized weakness continue with PT/OT  Hypernatremia on D5 water Thrombocytopenia resolved Hypokalemia resolved Ileus followup KUB Bacteremia with staph aureus Sputum culture positive for MRSA  Plan  D5 water at 75 mL per hour Check BMP in AM Family has unrealistic expectations  Code Status: Full  DVT Prophylaxis  heparin   Carron CurieHijazi, Ali M.D on 05/06/2014 at 1:16 PM

## 2014-05-07 ENCOUNTER — Other Ambulatory Visit (HOSPITAL_COMMUNITY): Payer: Self-pay

## 2014-05-07 LAB — CULTURE, BLOOD (ROUTINE X 2)
CULTURE: NO GROWTH
CULTURE: NO GROWTH

## 2014-05-07 LAB — BASIC METABOLIC PANEL
BUN: 45 mg/dL — AB (ref 6–23)
CHLORIDE: 96 meq/L (ref 96–112)
CO2: 43 meq/L — AB (ref 19–32)
Calcium: 9.4 mg/dL (ref 8.4–10.5)
Creatinine, Ser: 0.48 mg/dL — ABNORMAL LOW (ref 0.50–1.10)
GFR calc Af Amer: >90 mL/min (ref >90)
GFR calc non Af Amer: >90 mL/min (ref >90)
GLUCOSE: 226 mg/dL — AB (ref 70–99)
Potassium: 4.9 mEq/L (ref 3.7–5.3)
Sodium: 144 mEq/L (ref 137–147)

## 2014-05-07 NOTE — Progress Notes (Signed)
[facility]                                                                                              Progress note     Patient Demographics  Katelyn Rojas, is a 73 y.o. female  YBR:[id number]  [id number]  DOB - 07-Apr-1941  Admit date - 03/24/2014  Admitting Physician Carron Curie, MD  Outpatient Primary MD for the patient is [name],[name], MD  LOS - 44   Chief complaint    Respiratory failure    Protein calorie malnutrition         Subjective:   Katelyn Rojas confused cannot give any history/G-tube draining brownish material  Objective:   Vital signs  Temperature 98. Heart rate 82 Respiratory rate 18 Blood pressure 92/50 Pulse ox 95 %    Exam Alert awake, confused, laying down in a fetal position  [place].AT,  Supple Neck,No JVD, No cervical lymphadenopathy appreciated.  Tracheostomy noted in midline Symmetrical Chest wall movement, decreased breath sounds bilaterally basally, RRR,No Gallops,Rubs or new Murmurs, No Parasternal Heave +ve B.Sounds, Abd protuberant, distended Non tender, No organomegaly appriciated, No rebound - guarding or rigidity. No Cyanosis, Clubbing or edema, No new Rashes or bruise     I&Os +1670 Foley - yes   Data Review   CBC  Recent Labs Lab 05/03/14 0245 05/06/14 0545  WBC 8.4 9.4  HGB 10.0* 10.2*  HCT 32.4* 35.1*  PLT 302 367  MCV 99.7 104.2*  MCH 30.8 30.3  MCHC 30.9 29.1*  RDW 17.2* 17.4*  LYMPHSABS 0.5*  --   MONOABS 1.2*  --   EOSABS 0.2  --   BASOSABS 0.1  --     Chemistries   Recent Labs Lab 05/01/14 0659 05/03/14 0245 05/06/14 0545 05/07/14 0600  NA 142 142 149* 144  K 3.9 4.0 5.4* 4.9  CL 100 100 101 96  CO2 37* 41* 44* 43*  GLUCOSE 164* 126* 185* 226*  BUN 23 30* 39* 45*  CREATININE 0.48* 0.41* 0.45* 0.48*  CALCIUM 8.9 9.4 9.7 9.4  MG  --  1.9  --   --     ------------------------------------------------------------------------------------------------------------------ CrCl is unknown because both a height and weight (above a minimum accepted value) are required for this calculation. ------------------------------------------------------------------------------------------------------------------ No results found for this basename: HGBA1C,  in the last 72 hours ------------------------------------------------------------------------------------------------------------------ No results found for this basename: CHOL, HDL, LDLCALC, TRIG, CHOLHDL, LDLDIRECT,  in the last 72 hours ------------------------------------------------------------------------------------------------------------------ No results found for this basename: TSH, T4TOTAL, FREET3, T3FREE, THYROIDAB,  in the last 72 hours ------------------------------------------------------------------------------------------------------------------ No results found for this basename: VITAMINB12, FOLATE, FERRITIN, TIBC, IRON, RETICCTPCT,  in the last 72 hours  Coagulation profile No results found for this basename: INR, PROTIME,  in the last 168 hours  No results found for this basename: DDIMER,  in the last 72 hours  Cardiac Enzymes  Recent Labs Lab 05/03/14 0245 05/03/14 0900  CKMB 2.1 2.8   ------------------------------------------------------------------------------------------------------------------ No components found with this basename: POCBNP,   Micro Results Recent Results (from the past 240 hour(s))  CLOSTRIDIUM DIFFICILE BY PCR  Status: None   Collection Time    04/27/14  4:18 PM      Result Value Ref Range Status   C difficile by pcr NEGATIVE  NEGATIVE Final  CULTURE, BLOOD (ROUTINE X 2)     Status: None   Collection Time    04/30/14  2:20 PM      Result Value Ref Range Status   Specimen Description BLOOD LEFT HAND   Final   Special Requests BOTTLES DRAWN  AEROBIC ONLY 4CC   Final   Culture  Setup Time     Final   Value: 04/30/2014 19:09     Performed at [facility]   Culture     Final   Value: ENTEROCOCCUS SPECIES     Note: COMBINATION THERAPY OF HIGH DOSE AMPICILLIN OR VANCOMYCIN, PLUS AN AMINOGLYCOSIDE, IS USUALLY INDICATED FOR SERIOUS ENTEROCOCCAL INFECTIONS.     Note: Gram Stain Report Called to,Read Back By and Verified With: NANCY S@12 :43PM ON 05/01/14 BY DANTS     Performed at [facility]   Report Status 05/03/2014 FINAL   Final   Organism ID, Bacteria ENTEROCOCCUS SPECIES   Final  CULTURE, BLOOD (ROUTINE X 2)     Status: None   Collection Time    04/30/14  2:25 PM      Result Value Ref Range Status   Specimen Description BLOOD RIGHT HAND   Final   Special Requests BOTTLES DRAWN AEROBIC ONLY 3CC   Final   Culture  Setup Time     Final   Value: 04/30/2014 19:10     Performed at [facility]   Culture     Final   Value: NO GROWTH 5 DAYS     Performed at [facility]   Report Status 05/06/2014 FINAL   Final  CULTURE, BLOOD (ROUTINE X 2)     Status: None   Collection Time    05/01/14  2:35 PM      Result Value Ref Range Status   Specimen Description BLOOD RIGHT HAND   Final   Special Requests     Final   Value: BOTTLES DRAWN AEROBIC AND ANAEROBIC 10CC AER,5CC ANA   Culture  Setup Time     Final   Value: 05/01/2014 22:00     Performed at [facility]   Culture     Final   Value: NO GROWTH 5 DAYS     Performed at [facility]   Report Status 05/07/2014 FINAL   Final  CULTURE, BLOOD (ROUTINE X 2)     Status: None   Collection Time    05/01/14  2:55 PM      Result Value Ref Range Status   Specimen Description BLOOD LEFT HAND   Final   Special Requests BOTTLES DRAWN AEROBIC AND ANAEROBIC 10CC   Final   Culture  Setup Time     Final   Value: 05/01/2014 22:00     Performed at [facility]   Culture     Final   Value: NO GROWTH 5 DAYS     Performed at [facility]   Report Status 05/07/2014 FINAL   Final       Assessment & Plan  SIRS;  on IV vancomycin  Respiratory failure status post tracheostomy , ventilator dependence , continue with PSV trials  Anemia, severe-status post blood transfusion PEA/cardiac arrest with left pneumothorax status post chest tube placement on the left, removed 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 Family has unrealistic expectations Critical care time 36 minutes Code Status: Full  DVT Prophylaxis  heparin   Carron CurieHijazi, Ali M.D on 05/07/2014 at 11:49 AM

## 2014-05-08 ENCOUNTER — Other Ambulatory Visit (HOSPITAL_COMMUNITY): Payer: Self-pay

## 2014-05-08 LAB — BASIC METABOLIC PANEL
BUN: 30 mg/dL — ABNORMAL HIGH (ref 6–23)
CO2: 32 meq/L (ref 19–32)
CREATININE: 0.43 mg/dL — AB (ref 0.50–1.10)
Calcium: 9.4 mg/dL (ref 8.4–10.5)
Chloride: 90 mEq/L — ABNORMAL LOW (ref 96–112)
GFR calc Af Amer: >90 mL/min (ref >90)
GFR calc non Af Amer: >90 mL/min (ref >90)
GLUCOSE: 128 mg/dL — AB (ref 70–99)
Potassium: 5.1 mEq/L (ref 3.7–5.3)
Sodium: 134 mEq/L — ABNORMAL LOW (ref 137–147)

## 2014-05-08 LAB — MAGNESIUM: Magnesium: 2.1 mg/dL (ref 1.5–2.5)

## 2014-05-08 MED ORDER — IOHEXOL 300 MG/ML  SOLN
70.0000 mL | Freq: Once | INTRAMUSCULAR | Status: AC | PRN
  Administered 2014-05-08: 70 mL via INTRAVENOUS

## 2014-05-08 NOTE — Progress Notes (Signed)
[facility]                                                                                              Progress note     Patient Demographics  Katelyn Rojas, is a 73 y.o. female  ZOX:[id number]  [id number]:[id number]  DOB - May 02, 1941  Admit date - 03/24/2014  Admitting Physician Carron Curie, MD  Outpatient Primary MD for the patient is [name],[name], MD  LOS - 45   Chief complaint    Respiratory failure    Protein calorie malnutrition         Subjective:   [name] confused cannot give any history/fecal material noted around the PEG tube insertion  Objective:   Vital signs  Temperature 98. Heart rate 90 Respiratory rate 17 Blood pressure 113/37 Pulse ox 93 %    Exam Alert awake, confused, laying down in a fetal position  [place].AT,  Supple Neck,No JVD, No cervical lymphadenopathy appreciated.  Tracheostomy noted in midline Symmetrical Chest wall movement, decreased breath sounds bilaterally basally, RRR,No Gallops,Rubs or new Murmurs, No Parasternal Heave +ve B.Sounds, Abd protuberant, distended Non tender, No organomegaly appriciated, No rebound - guarding or rigidity. No Cyanosis, Clubbing or edema, No new Rashes or bruise     I&Os +270 Foley - yes   Data Review   CBC  Recent Labs Lab 05/03/14 0245 05/06/14 0545  WBC 8.4 9.4  HGB 10.0* 10.2*  HCT 32.4* 35.1*  PLT 302 367  MCV 99.7 104.2*  MCH 30.8 30.3  MCHC 30.9 29.1*  RDW 17.2* 17.4*  LYMPHSABS 0.5*  --   MONOABS 1.2*  --   EOSABS 0.2  --   BASOSABS 0.1  --     Chemistries   Recent Labs Lab 05/03/14 0245 05/06/14 0545 05/07/14 0600 05/08/14 0548  NA 142 149* 144 134*  K 4.0 5.4* 4.9 5.1  CL 100 101 96 90*  CO2 41* 44* 43* 32  GLUCOSE 126* 185* 226* 128*  BUN 30* 39* 45* 30*  CREATININE 0.41* 0.45* 0.48* 0.43*  CALCIUM 9.4 9.7 9.4 9.4  MG 1.9  --   --  2.1    ------------------------------------------------------------------------------------------------------------------ CrCl is unknown because both a height and weight (above a minimum accepted value) are required for this calculation. ------------------------------------------------------------------------------------------------------------------ No results found for this basename: HGBA1C,  in the last 72 hours ------------------------------------------------------------------------------------------------------------------ No results found for this basename: CHOL, HDL, LDLCALC, TRIG, CHOLHDL, LDLDIRECT,  in the last 72 hours ------------------------------------------------------------------------------------------------------------------ No results found for this basename: TSH, T4TOTAL, FREET3, T3FREE, THYROIDAB,  in the last 72 hours ------------------------------------------------------------------------------------------------------------------ No results found for this basename: VITAMINB12, FOLATE, FERRITIN, TIBC, IRON, RETICCTPCT,  in the last 72 hours  Coagulation profile No results found for this basename: INR, PROTIME,  in the last 168 hours  No results found for this basename: DDIMER,  in the last 72 hours  Cardiac Enzymes  Recent Labs Lab 05/03/14 0245 05/03/14 0900  CKMB 2.1 2.8   ------------------------------------------------------------------------------------------------------------------ No components found with this basename: POCBNP,   Micro Results Recent Results (from the past 240 hour(s))  CULTURE, BLOOD (ROUTINE X 2)  Status: None   Collection Time    04/30/14  2:20 PM      Result Value Ref Range Status   Specimen Description BLOOD LEFT HAND   Final   Special Requests BOTTLES DRAWN AEROBIC ONLY 4CC   Final   Culture  Setup Time     Final   Value: 04/30/2014 19:09     Performed at [facility]   Culture     Final   Value: ENTEROCOCCUS SPECIES      Note: COMBINATION THERAPY OF HIGH DOSE AMPICILLIN OR VANCOMYCIN, PLUS AN AMINOGLYCOSIDE, IS USUALLY INDICATED FOR SERIOUS ENTEROCOCCAL INFECTIONS.     Note: Gram Stain Report Called to,Read Back By and Verified With: NANCY S@12 :43PM ON 05/01/14 BY DANTS     Performed at [facility]   Report Status 05/03/2014 FINAL   Final   Organism ID, Bacteria ENTEROCOCCUS SPECIES   Final  CULTURE, BLOOD (ROUTINE X 2)     Status: None   Collection Time    04/30/14  2:25 PM      Result Value Ref Range Status   Specimen Description BLOOD RIGHT HAND   Final   Special Requests BOTTLES DRAWN AEROBIC ONLY 3CC   Final   Culture  Setup Time     Final   Value: 04/30/2014 19:10     Performed at [facility]   Culture     Final   Value: NO GROWTH 5 DAYS     Performed at [facility]   Report Status 05/06/2014 FINAL   Final  CULTURE, BLOOD (ROUTINE X 2)     Status: None   Collection Time    05/01/14  2:35 PM      Result Value Ref Range Status   Specimen Description BLOOD RIGHT HAND   Final   Special Requests     Final   Value: BOTTLES DRAWN AEROBIC AND ANAEROBIC 10CC AER,5CC ANA   Culture  Setup Time     Final   Value: 05/01/2014 22:00     Performed at [facility]   Culture     Final   Value: NO GROWTH 5 DAYS     Performed at [facility]   Report Status 05/07/2014 FINAL   Final  CULTURE, BLOOD (ROUTINE X 2)     Status: None   Collection Time    05/01/14  2:55 PM      Result Value Ref Range Status   Specimen Description BLOOD LEFT HAND   Final   Special Requests BOTTLES DRAWN AEROBIC AND ANAEROBIC 10CC   Final   Culture  Setup Time     Final   Value: 05/01/2014 22:00     Performed at [facility]   Culture     Final   Value: NO GROWTH 5 DAYS     Performed at [facility]   Report Status 05/07/2014 FINAL   Final       Assessment & Plan  SIRS;  on IV vancomycin  Respiratory failure status post tracheostomy , ventilator dependence ,  continue with PSV trials  Anemia, severe-status post blood transfusion PEA/cardiac arrest with left pneumothorax status post chest tube placement on the left, removed Chronic sacral decubitus ulcer with MRSA being treated  History of protein calorie malnutrition/dysphagia on tube feeding per PEG Hypertension controlled Dementia , advanced on Aricept Osteoarthritis pain control Multiple and chronic thoracic vertebral compression fractures IV fentanyl PRN Generalized weakness continue with PT/OT  Hypernatremia  on D5 water Thrombocytopenia resolved Hypokalemia resolved Ileus  Bacteremia with staph aureus Sputum culture positive for MRSA  Plan   CT of abdomen and pelvis with contrast to find the cause of the feculent material around the PEG insertion area Check KUB and labs in a.m.  Critical care time 34 minutes  Code Status: Full  DVT Prophylaxis  heparin   Carron Curie M.D on 05/08/2014 at 11:37 AM

## 2014-05-09 LAB — CBC
HEMATOCRIT: 31.1 % — AB (ref 36.0–46.0)
HEMOGLOBIN: 9.7 g/dL — AB (ref 12.0–15.0)
MCH: 30.2 pg (ref 26.0–34.0)
MCHC: 31.2 g/dL (ref 30.0–36.0)
MCV: 96.9 fL (ref 78.0–100.0)
Platelets: 270 10*3/uL (ref 150–400)
RBC: 3.21 MIL/uL — ABNORMAL LOW (ref 3.87–5.11)
RDW: 16 % — AB (ref 11.5–15.5)
WBC: 10.6 10*3/uL — ABNORMAL HIGH (ref 4.0–10.5)

## 2014-05-09 LAB — BASIC METABOLIC PANEL
BUN: 22 mg/dL (ref 6–23)
CO2: 28 meq/L (ref 19–32)
Calcium: 9.1 mg/dL (ref 8.4–10.5)
Chloride: 89 mEq/L — ABNORMAL LOW (ref 96–112)
Creatinine, Ser: 0.44 mg/dL — ABNORMAL LOW (ref 0.50–1.10)
GFR calc non Af Amer: >90 mL/min (ref >90)
Glucose, Bld: 79 mg/dL (ref 70–99)
POTASSIUM: 4.6 meq/L (ref 3.7–5.3)
Sodium: 131 mEq/L — ABNORMAL LOW (ref 137–147)

## 2014-05-09 LAB — PROCALCITONIN: PROCALCITONIN: 0.12 ng/mL

## 2014-05-09 NOTE — Progress Notes (Addendum)
[facility]                                                                                              Progress note     Patient Demographics  Katelyn Rojas, is a 73 y.o. female  UOH:[id number]  ZMC:[id number]  DOB - 10-07-1941  Admit date - 03/24/2014  Admitting Physician Carron Curie, MD  Outpatient Primary MD for the patient is [name],[name], MD  LOS - 46   Chief complaint    Respiratory failure    Protein calorie malnutrition         Subjective:   Nihira Larcom confused cannot give any history/fecal material noted around the PEG tube insertion  Objective:   Vital signs  Temperature 97.5 Heart rate 80 Respiratory rate 18 Blood pressure 108/72 Pulse ox 96 %    Exam Alert awake, confused, laying down in a fetal position  [place].AT,  Supple Neck,No JVD, No cervical lymphadenopathy appreciated.  Tracheostomy noted in midline Symmetrical Chest wall movement, decreased breath sounds bilaterally basally, RRR,No Gallops,Rubs or new Murmurs, No Parasternal Heave +ve B.Sounds, Abd protuberant, distended Non tender, No organomegaly appriciated, No rebound - guarding or rigidity. No Cyanosis, Clubbing or edema, No new Rashes or bruise     I&Os +510 Foley - yes   Data Review   CBC  Recent Labs Lab 05/03/14 0245 05/06/14 0545 05/09/14 0531  WBC 8.4 9.4 10.6*  HGB 10.0* 10.2* 9.7*  HCT 32.4* 35.1* 31.1*  PLT 302 367 270  MCV 99.7 104.2* 96.9  MCH 30.8 30.3 30.2  MCHC 30.9 29.1* 31.2  RDW 17.2* 17.4* 16.0*  LYMPHSABS 0.5*  --   --   MONOABS 1.2*  --   --   EOSABS 0.2  --   --   BASOSABS 0.1  --   --     Chemistries   Recent Labs Lab 05/03/14 0245 05/06/14 0545 05/07/14 0600 05/08/14 0548 05/09/14 0531  NA 142 149* 144 134* 131*  K 4.0 5.4* 4.9 5.1 4.6  CL 100 101 96 90* 89*  CO2 41* 44* 43* 32 28  GLUCOSE 126* 185* 226* 128* 79  BUN 30* 39* 45* 30* 22   CREATININE 0.41* 0.45* 0.48* 0.43* 0.44*  CALCIUM 9.4 9.7 9.4 9.4 9.1  MG 1.9  --   --  2.1  --    ------------------------------------------------------------------------------------------------------------------ CrCl is unknown because both a height and weight (above a minimum accepted value) are required for this calculation. ------------------------------------------------------------------------------------------------------------------ No results found for this basename: HGBA1C,  in the last 72 hours ------------------------------------------------------------------------------------------------------------------ No results found for this basename: CHOL, HDL, LDLCALC, TRIG, CHOLHDL, LDLDIRECT,  in the last 72 hours ------------------------------------------------------------------------------------------------------------------ No results found for this basename: TSH, T4TOTAL, FREET3, T3FREE, THYROIDAB,  in the last 72 hours ------------------------------------------------------------------------------------------------------------------ No results found for this basename: VITAMINB12, FOLATE, FERRITIN, TIBC, IRON, RETICCTPCT,  in the last 72 hours  Coagulation profile No results found for this basename: INR, PROTIME,  in the last 168 hours  No results found for this basename: DDIMER,  in the last 72 hours  Cardiac Enzymes  Recent Labs Lab 05/03/14  0245 05/03/14 0900  CKMB 2.1 2.8   ------------------------------------------------------------------------------------------------------------------ No components found with this basename: POCBNP,   Micro Results Recent Results (from the past 240 hour(s))  CULTURE, BLOOD (ROUTINE X 2)     Status: None   Collection Time    04/30/14  2:20 PM      Result Value Ref Range Status   Specimen Description BLOOD LEFT HAND   Final   Special Requests BOTTLES DRAWN AEROBIC ONLY 4CC   Final   Culture  Setup Time     Final   Value:  04/30/2014 19:09     Performed at [facility]   Culture     Final   Value: ENTEROCOCCUS SPECIES     Note: COMBINATION THERAPY OF HIGH DOSE AMPICILLIN OR VANCOMYCIN, PLUS AN AMINOGLYCOSIDE, IS USUALLY INDICATED FOR SERIOUS ENTEROCOCCAL INFECTIONS.     Note: Gram Stain Report Called to,Read Back By and Verified With: NANCY S@12 :43PM ON 05/01/14 BY DANTS     Performed at [facility]   Report Status 05/03/2014 FINAL   Final   Organism ID, Bacteria ENTEROCOCCUS SPECIES   Final  CULTURE, BLOOD (ROUTINE X 2)     Status: None   Collection Time    04/30/14  2:25 PM      Result Value Ref Range Status   Specimen Description BLOOD RIGHT HAND   Final   Special Requests BOTTLES DRAWN AEROBIC ONLY 3CC   Final   Culture  Setup Time     Final   Value: 04/30/2014 19:10     Performed at [facility]   Culture     Final   Value: NO GROWTH 5 DAYS     Performed at [facility]   Report Status 05/06/2014 FINAL   Final  CULTURE, BLOOD (ROUTINE X 2)     Status: None   Collection Time    05/01/14  2:35 PM      Result Value Ref Range Status   Specimen Description BLOOD RIGHT HAND   Final   Special Requests     Final   Value: BOTTLES DRAWN AEROBIC AND ANAEROBIC 10CC AER,5CC ANA   Culture  Setup Time     Final   Value: 05/01/2014 22:00     Performed at [facility]   Culture     Final   Value: NO GROWTH 5 DAYS     Performed at [facility]   Report Status 05/07/2014 FINAL   Final  CULTURE, BLOOD (ROUTINE X 2)     Status: None   Collection Time    05/01/14  2:55 PM      Result Value Ref Range Status   Specimen Description BLOOD LEFT HAND   Final   Special Requests BOTTLES DRAWN AEROBIC AND ANAEROBIC 10CC   Final   Culture  Setup Time     Final   Value: 05/01/2014 22:00     Performed at [facility]   Culture     Final   Value: NO GROWTH 5 DAYS     Performed at [facility]   Report Status 05/07/2014 FINAL   Final        Assessment & Plan  SIRS;  on IV vancomycin  Respiratory failure status post tracheostomy , ventilator dependence , continue with PSV trials  Anemia, severe-status post blood transfusion PEA/cardiac arrest with left pneumothorax status post chest tube placement on the left, removed Chronic sacral decubitus ulcer with MRSA being treated  History of protein calorie malnutrition/dysphagia she feeding held due to to leakage  Hypertension controlled Dementia , advanced on Aricept Osteoarthritis pain control Multiple and chronic thoracic vertebral compression fractures IV fentanyl PRN Generalized weakness continue with PT/OT  Hypernatremia on D5 water Thrombocytopenia resolved Hypokalemia resolved Ileus resolved Bacteremia with staph aureus Sputum culture positive for MRSA  Plan   Start D. 10 water Hold Tube feeding again Consult IR for evaluation of PEG tube   Critical care time 36 minutes  Code Status: Full  DVT Prophylaxis  heparin   Carron Curie M.D on 05/09/2014 at 1:11 PM

## 2014-05-10 NOTE — Progress Notes (Addendum)
[facility]                                                                                              Progress note     Patient Demographics  Katelyn Rojas, is a 73 y.o. female  ZOX:[id number]  WJX:[id number]  DOB - 1941-04-16  Admit date - 03/24/2014  Admitting Physician Carron Curie, MD  Outpatient Primary MD for the patient is [name],[name], MD  LOS - 47   Chief complaint    Respiratory failure    Protein calorie malnutrition         Subjective:   Katelyn Rojas confused cannot give any history/fecal material noted around the PEG tube insertion  Objective:   Vital signs  Temperature 97.6 Heart rate 89 Respiratory rate 20 Blood pressure and 10/68 Pulse ox 99 %    Exam Alert awake, confused, laying down in a fetal position  [place].AT,  Supple Neck,No JVD, No cervical lymphadenopathy appreciated.  Tracheostomy noted in midline Symmetrical Chest wall movement, decreased breath sounds bilaterally basally, RRR,No Gallops,Rubs or new Murmurs, No Parasternal Heave +ve B.Sounds, Abd protuberant, distended Non tender, No organomegaly appriciated, No rebound - guarding or rigidity. No Cyanosis, Clubbing or edema, No new Rashes or bruise     I&Os -920 Foley - yes   Data Review   CBC  Recent Labs Lab 05/06/14 0545 05/09/14 0531  WBC 9.4 10.6*  HGB 10.2* 9.7*  HCT 35.1* 31.1*  PLT 367 270  MCV 104.2* 96.9  MCH 30.3 30.2  MCHC 29.1* 31.2  RDW 17.4* 16.0*    Chemistries   Recent Labs Lab 05/06/14 0545 05/07/14 0600 05/08/14 0548 05/09/14 0531  NA 149* 144 134* 131*  K 5.4* 4.9 5.1 4.6  CL 101 96 90* 89*  CO2 44* 43* 32 28  GLUCOSE 185* 226* 128* 79  BUN 39* 45* 30* 22  CREATININE 0.45* 0.48* 0.43* 0.44*  CALCIUM 9.7 9.4 9.4 9.1  MG  --   --  2.1  --     ------------------------------------------------------------------------------------------------------------------ CrCl is unknown because both a height and weight (above a minimum accepted value) are required for this calculation. ------------------------------------------------------------------------------------------------------------------ No results found for this basename: HGBA1C,  in the last 72 hours ------------------------------------------------------------------------------------------------------------------ No results found for this basename: CHOL, HDL, LDLCALC, TRIG, CHOLHDL, LDLDIRECT,  in the last 72 hours ------------------------------------------------------------------------------------------------------------------ No results found for this basename: TSH, T4TOTAL, FREET3, T3FREE, THYROIDAB,  in the last 72 hours ------------------------------------------------------------------------------------------------------------------ No results found for this basename: VITAMINB12, FOLATE, FERRITIN, TIBC, IRON, RETICCTPCT,  in the last 72 hours  Coagulation profile No results found for this basename: INR, PROTIME,  in the last 168 hours  No results found for this basename: DDIMER,  in the last 72 hours  Cardiac Enzymes No results found for this basename: CK, CKMB, TROPONINI, MYOGLOBIN,  in the last 168 hours ------------------------------------------------------------------------------------------------------------------ No components found with this basename: POCBNP,   Micro Results Recent Results (from the past 240 hour(s))  CULTURE, BLOOD (ROUTINE X 2)     Status: None   Collection Time    04/30/14  2:20 PM  Result Value Ref Range Status   Specimen Description BLOOD LEFT HAND   Final   Special Requests BOTTLES DRAWN AEROBIC ONLY 4CC   Final   Culture  Setup Time     Final   Value: 04/30/2014 19:09     Performed at [facility]   Culture     Final    Value: ENTEROCOCCUS SPECIES     Note: COMBINATION THERAPY OF HIGH DOSE AMPICILLIN OR VANCOMYCIN, PLUS AN AMINOGLYCOSIDE, IS USUALLY INDICATED FOR SERIOUS ENTEROCOCCAL INFECTIONS.     Note: Gram Stain Report Called to,Read Back By and Verified With: NANCY S@12 :43PM ON 05/01/14 BY DANTS     Performed at [facility]   Report Status 05/03/2014 FINAL   Final   Organism ID, Bacteria ENTEROCOCCUS SPECIES   Final  CULTURE, BLOOD (ROUTINE X 2)     Status: None   Collection Time    04/30/14  2:25 PM      Result Value Ref Range Status   Specimen Description BLOOD RIGHT HAND   Final   Special Requests BOTTLES DRAWN AEROBIC ONLY 3CC   Final   Culture  Setup Time     Final   Value: 04/30/2014 19:10     Performed at [facility]   Culture     Final   Value: NO GROWTH 5 DAYS     Performed at [facility]   Report Status 05/06/2014 FINAL   Final  CULTURE, BLOOD (ROUTINE X 2)     Status: None   Collection Time    05/01/14  2:35 PM      Result Value Ref Range Status   Specimen Description BLOOD RIGHT HAND   Final   Special Requests     Final   Value: BOTTLES DRAWN AEROBIC AND ANAEROBIC 10CC AER,5CC ANA   Culture  Setup Time     Final   Value: 05/01/2014 22:00     Performed at [facility]   Culture     Final   Value: NO GROWTH 5 DAYS     Performed at [facility]   Report Status 05/07/2014 FINAL   Final  CULTURE, BLOOD (ROUTINE X 2)     Status: None   Collection Time    05/01/14  2:55 PM      Result Value Ref Range Status   Specimen Description BLOOD LEFT HAND   Final   Special Requests BOTTLES DRAWN AEROBIC AND ANAEROBIC 10CC   Final   Culture  Setup Time     Final   Value: 05/01/2014 22:00     Performed at [facility]   Culture     Final   Value: NO GROWTH 5 DAYS     Performed at [facility]   Report Status 05/07/2014 FINAL   Final       Assessment & Plan  SIRS;  on IV vancomycin  Respiratory failure status post  tracheostomy , ventilator dependence , continue with PSV trials  Anemia, severe-status post blood transfusion PEA/cardiac arrest with left pneumothorax status post chest tube placement on the left, removed Chronic sacral decubitus ulcer with MRSA being treated  History of protein calorie malnutrition/dysphagia she feeding held due to to leakage  Hypertension controlled Dementia , advanced on Aricept Osteoarthritis pain control Multiple and chronic thoracic vertebral compression fractures IV fentanyl PRN Generalized weakness continue with PT/OT  Hypernatremia resolved to a hyponatremia; IV fluids changed today Thrombocytopenia resolved Hypokalemia resolved Ileus resolved Bacteremia  with staph aureus on IV vancomycin Sputum culture positive for MRSA  Plan   Change IV fluid to D5 normal saline at 75 mL per hour Check BMP CBC portable chest x-ray in am Awaiting IR evaluation of PEG tube  Critical care time 34 minutes  Code Status: Full  DVT Prophylaxis  heparin   Carron CurieHijazi, Ali M.D on 05/10/2014 at 12:32 PM

## 2014-05-11 ENCOUNTER — Other Ambulatory Visit (HOSPITAL_COMMUNITY): Payer: Self-pay

## 2014-05-11 LAB — BASIC METABOLIC PANEL
BUN: 20 mg/dL (ref 6–23)
CO2: 32 meq/L (ref 19–32)
Calcium: 8.9 mg/dL (ref 8.4–10.5)
Chloride: 98 mEq/L (ref 96–112)
Creatinine, Ser: 0.48 mg/dL — ABNORMAL LOW (ref 0.50–1.10)
GFR calc non Af Amer: >90 mL/min (ref >90)
Glucose, Bld: 117 mg/dL — ABNORMAL HIGH (ref 70–99)
POTASSIUM: 4.2 meq/L (ref 3.7–5.3)
SODIUM: 139 meq/L (ref 137–147)

## 2014-05-11 LAB — CBC
HEMATOCRIT: 31.9 % — AB (ref 36.0–46.0)
Hemoglobin: 10 g/dL — ABNORMAL LOW (ref 12.0–15.0)
MCH: 31.2 pg (ref 26.0–34.0)
MCHC: 31.3 g/dL (ref 30.0–36.0)
MCV: 99.4 fL (ref 78.0–100.0)
PLATELETS: 230 10*3/uL (ref 150–400)
RBC: 3.21 MIL/uL — AB (ref 3.87–5.11)
RDW: 15.9 % — ABNORMAL HIGH (ref 11.5–15.5)
WBC: 11.9 10*3/uL — ABNORMAL HIGH (ref 4.0–10.5)

## 2014-05-11 NOTE — Progress Notes (Signed)
PULMONARY / CRITICAL CARE MEDICINE  Name: Katelyn Rojas MRN: [id number] DOB: 1941-11-07    ADMISSION DATE:  03/24/2014 CONSULTATION DATE:  03/26/2014  REFERRING MD :  Dr. Sharyon Medicus  CHIEF COMPLAINT:  Acute on chronic respiratory failure  BRIEF PATIENT DESCRIPTION: 73 yo with end stage COPD, respiratory cachexia and history of multiple intubations.  INTERVAL HISTORY: Consistently failing weaning trials  VITAL SIGNS:  Reviewed  PHYSICAL EXAMINATION: General:   Cachectic, severe kyphoscoliosis, no distress Neuro:  Awake, alert, follows some commands HEENT:  Tracheostomy site intact, no hemorrhage Neck:  No JVD Cardiovascular: Very diminished air entry, no added sounds Abdomen:  Soft, bowel sounds diminished Musculoskeletal:  No edema Skin:  Sacral decub  LABS: CBC  Recent Labs Lab 05/06/14 0545 05/09/14 0531 05/11/14 0545  WBC 9.4 10.6* 11.9*  HGB 10.2* 9.7* 10.0*  HCT 35.1* 31.1* 31.9*  PLT 367 270 230   BMET  Recent Labs Lab 05/08/14 0548 05/09/14 0531 05/11/14 0545  NA 134* 131* 139  K 5.1 4.6 4.2  CL 90* 89* 98  CO2 32 28 32  BUN 30* 22 20  CREATININE 0.43* 0.44* 0.48*  GLUCOSE 128* 79 117*   Electrolytes  Recent Labs Lab 05/08/14 0548 05/09/14 0531 05/11/14 0545  CALCIUM 9.4 9.1 8.9  MG 2.1  --   --    IMAGING:  Dg Chest Port 1 View  05/11/2014   CLINICAL DATA:  Respiratory failure.  EXAM: PORTABLE CHEST - 1 VIEW  COMPARISON:  05/06/2014 and CT chest 11/04/2013.  FINDINGS: Tracheostomy is midline. Right PICC tip projects over the right subclavian vein, at the level of the right clavicular head. Heart size normal. Multiple scattered convex pleural opacities are again seen. Mild bibasilar airspace disease. No definite pleural fluid.  IMPRESSION: 1. Mild left basilar airspace disease. 2. Several convex pleural opacities, upper and mid lung zone predominant, may relate to rib fractures. Difficult to definitively exclude apical masses. If further  evaluation is desired, chest CT without contrast could be performed.   Electronically Signed   By: Leanna Battles M.D.   On: 05/11/2014 08:18    ASSESSMENT / PLAN:  End-stage COPD Restrictive lung disease secondary to kyphoscoliosis  Respiratory cachexia Acute on chronic respiratory failure Failure to wean Tracheostomy status Pneumothorax, resolved   Would NOT recommend further weaning attempts as clearly has uncurable progressive end stage pulmonary condition  Needs placement in chronic vent facility  Continuing goals of care discussions / palliative approach appear to be very appropriate in this case  PCCM has nothing further to offer, will sign off  I have personally obtained history, examined patient, evaluated and interpreted laboratory and imaging results, reviewed medical records, formulated assessment / plan and placed orders.  Lonia Farber, MD Pulmonary and Critical Care Medicine [facility] Pager: [phone]  05/11/2014, 12:39 PM

## 2014-05-13 LAB — BASIC METABOLIC PANEL
BUN: 28 mg/dL — AB (ref 6–23)
CO2: 30 mEq/L (ref 19–32)
Calcium: 8.7 mg/dL (ref 8.4–10.5)
Chloride: 99 mEq/L (ref 96–112)
Creatinine, Ser: 0.45 mg/dL — ABNORMAL LOW (ref 0.50–1.10)
GFR calc Af Amer: >90 mL/min (ref >90)
GLUCOSE: 139 mg/dL — AB (ref 70–99)
POTASSIUM: 4.3 meq/L (ref 3.7–5.3)
SODIUM: 139 meq/L (ref 137–147)

## 2014-05-13 LAB — CBC
HEMATOCRIT: 31.2 % — AB (ref 36.0–46.0)
Hemoglobin: 9.5 g/dL — ABNORMAL LOW (ref 12.0–15.0)
MCH: 30.9 pg (ref 26.0–34.0)
MCHC: 30.4 g/dL (ref 30.0–36.0)
MCV: 101.6 fL — ABNORMAL HIGH (ref 78.0–100.0)
Platelets: 194 10*3/uL (ref 150–400)
RBC: 3.07 MIL/uL — ABNORMAL LOW (ref 3.87–5.11)
RDW: 16.3 % — ABNORMAL HIGH (ref 11.5–15.5)
WBC: 17.7 10*3/uL — ABNORMAL HIGH (ref 4.0–10.5)

## 2014-05-14 ENCOUNTER — Other Ambulatory Visit (HOSPITAL_COMMUNITY): Payer: Self-pay

## 2014-05-14 LAB — CBC
HCT: 31 % — ABNORMAL LOW (ref 36.0–46.0)
HEMOGLOBIN: 9.3 g/dL — AB (ref 12.0–15.0)
MCH: 30.4 pg (ref 26.0–34.0)
MCHC: 30 g/dL (ref 30.0–36.0)
MCV: 101.3 fL — AB (ref 78.0–100.0)
Platelets: 198 10*3/uL (ref 150–400)
RBC: 3.06 MIL/uL — AB (ref 3.87–5.11)
RDW: 17 % — ABNORMAL HIGH (ref 11.5–15.5)
WBC: 16 10*3/uL — ABNORMAL HIGH (ref 4.0–10.5)

## 2014-05-14 LAB — COMPREHENSIVE METABOLIC PANEL
ALT: 23 U/L (ref 0–35)
ANION GAP: 7 (ref 5–15)
AST: 20 U/L (ref 0–37)
Albumin: 2.5 g/dL — ABNORMAL LOW (ref 3.5–5.2)
Alkaline Phosphatase: 156 U/L — ABNORMAL HIGH (ref 39–117)
BUN: 31 mg/dL — AB (ref 6–23)
CALCIUM: 9.1 mg/dL (ref 8.4–10.5)
CO2: 35 meq/L — AB (ref 19–32)
CREATININE: 0.43 mg/dL — AB (ref 0.50–1.10)
Chloride: 100 mEq/L (ref 96–112)
GFR calc Af Amer: >90 mL/min (ref >90)
GLUCOSE: 150 mg/dL — AB (ref 70–99)
Potassium: 4.2 mEq/L (ref 3.7–5.3)
Sodium: 142 mEq/L (ref 137–147)
TOTAL PROTEIN: 6.4 g/dL (ref 6.0–8.3)
Total Bilirubin: 0.2 mg/dL — ABNORMAL LOW (ref 0.3–1.2)

## 2014-05-14 LAB — C-REACTIVE PROTEIN: CRP: 2.6 mg/dL — ABNORMAL HIGH (ref <0.60)

## 2014-05-14 LAB — SEDIMENTATION RATE: SED RATE: 62 mm/h — AB (ref 0–22)

## 2014-05-17 ENCOUNTER — Other Ambulatory Visit (HOSPITAL_COMMUNITY): Payer: Self-pay

## 2014-05-18 NOTE — Progress Notes (Addendum)
[facility]                                                                                              Progress note     Patient Demographics  Katelyn Rojas, is a 73 y.o. female  IHK:[id number]  [id number]:[id number]  DOB - 1941/11/04  Admit date - 03/24/2014  Admitting Physician Carron Curie, MD  Outpatient Primary MD for the patient is [name],[name], MD  LOS - 55   Chief complaint    Respiratory failure    Protein calorie malnutrition         Subjective:   Katelyn Rojas confused cannot give any history  Objective:   Vital signs  Temperature 97.3 Heart rate 91 Respiratory rate 18 Blood pressure and 110/68 Pulse ox 94 %    Exam Alert awake, confused, laying down in a fetal position  [place].AT,  Supple Neck,No JVD, No cervical lymphadenopathy appreciated.  Tracheostomy noted in midline, increased secretions around tracheostomy site Symmetrical Chest wall movement, decreased breath sounds bilaterally basally, RRR,No Gallops,Rubs or new Murmurs, No Parasternal Heave +ve B.Sounds, Abd protuberant, distended Non tender, No organomegaly appriciated, No rebound - guarding or rigidity. No Cyanosis, Clubbing or edema, No new Rashes or bruise     I&Os-1355 Foley - yes   Data Review   CBC  Recent Labs Lab 05/13/14 0655 05/14/14 0520  WBC 17.7* 16.0*  HGB 9.5* 9.3*  HCT 31.2* 31.0*  PLT 194 198  MCV 101.6* 101.3*  MCH 30.9 30.4  MCHC 30.4 30.0  RDW 16.3* 17.0*    Chemistries   Recent Labs Lab 05/13/14 0655 05/14/14 0520  NA 139 142  K 4.3 4.2  CL 99 100  CO2 30 35*  GLUCOSE 139* 150*  BUN 28* 31*  CREATININE 0.45* 0.43*  CALCIUM 8.7 9.1  AST  --  20  ALT  --  23  ALKPHOS  --  156*  BILITOT  --  0.2*   ------------------------------------------------------------------------------------------------------------------ CrCl is unknown because both a height and  weight (above a minimum accepted value) are required for this calculation. ------------------------------------------------------------------------------------------------------------------ No results found for this basename: HGBA1C,  in the last 72 hours ------------------------------------------------------------------------------------------------------------------ No results found for this basename: CHOL, HDL, LDLCALC, TRIG, CHOLHDL, LDLDIRECT,  in the last 72 hours ------------------------------------------------------------------------------------------------------------------ No results found for this basename: TSH, T4TOTAL, FREET3, T3FREE, THYROIDAB,  in the last 72 hours ------------------------------------------------------------------------------------------------------------------ No results found for this basename: VITAMINB12, FOLATE, FERRITIN, TIBC, IRON, RETICCTPCT,  in the last 72 hours  Coagulation profile No results found for this basename: INR, PROTIME,  in the last 168 hours  No results found for this basename: DDIMER,  in the last 72 hours  Cardiac Enzymes No results found for this basename: CK, CKMB, TROPONINI, MYOGLOBIN,  in the last 168 hours ------------------------------------------------------------------------------------------------------------------ No components found with this basename: POCBNP,   Micro Results No results found for this or any previous visit (from the past 240 hour(s)).     Assessment & Plan  SIRS;  resolved Respiratory failure status post tracheostomy , ventilator dependence , weaning unsuccessful, increased secretions Anemia, severe-status post blood transfusion PEA/cardiac  arrest with left pneumothorax status post chest tube placement on the left, removed Chronic sacral decubitus ulcer with MRSA  treated  History of protein calorie malnutrition/dysphagia on tube feeding  Hypertension controlled Dementia , advanced on  Aricept Osteoarthritis pain control Multiple and chronic thoracic vertebral compression fractures IV fentanyl PRN Generalized weakness continue with PT/OT  Hypernatremia resolved  Thrombocytopenia resolved Hypokalemia resolved Ileus resolved Bacteremia with staph aureus treated Sputum culture positive for MRSA treated Bradycardia; resolved G -tube Infection on Bacitracin  Plan Culture tracheostomy secretions Bacitrcin for G-tube infection   Check labs in a.m. Ventilator management Critical care time 33 minutes  Code Status: Full  DVT Prophylaxis  heparin   Carron CurieHijazi, Katelyn Rojas M.D on 05/18/2014 at 1:34 PM

## 2014-05-19 LAB — BASIC METABOLIC PANEL
ANION GAP: 7 (ref 5–15)
BUN: 58 mg/dL — AB (ref 6–23)
CHLORIDE: 96 meq/L (ref 96–112)
CO2: 39 mEq/L — ABNORMAL HIGH (ref 19–32)
Calcium: 9.3 mg/dL (ref 8.4–10.5)
Creatinine, Ser: 0.47 mg/dL — ABNORMAL LOW (ref 0.50–1.10)
Glucose, Bld: 130 mg/dL — ABNORMAL HIGH (ref 70–99)
POTASSIUM: 5.4 meq/L — AB (ref 3.7–5.3)
SODIUM: 142 meq/L (ref 137–147)

## 2014-05-19 LAB — CBC
HEMATOCRIT: 29.3 % — AB (ref 36.0–46.0)
Hemoglobin: 8.7 g/dL — ABNORMAL LOW (ref 12.0–15.0)
MCH: 30.6 pg (ref 26.0–34.0)
MCHC: 29.7 g/dL — AB (ref 30.0–36.0)
MCV: 103.2 fL — ABNORMAL HIGH (ref 78.0–100.0)
PLATELETS: 231 10*3/uL (ref 150–400)
RBC: 2.84 MIL/uL — ABNORMAL LOW (ref 3.87–5.11)
RDW: 18.5 % — AB (ref 11.5–15.5)
WBC: 13 10*3/uL — AB (ref 4.0–10.5)

## 2014-05-19 NOTE — Progress Notes (Addendum)
[facility]                                                                                              Progress note     Patient Demographics  Guerry BruinDelsenia Hayse, is a 73 y.o. female  ZOX:[id number]  WJX:[id number]  DOB - 05/14/1941  Admit date - 03/24/2014  Admitting Physician Carron CurieAli Quashaun Lazalde, MD  Outpatient Primary MD for the patient is [name],[name], MD  LOS - 56   Chief complaint    Respiratory failure    Protein calorie malnutrition         Subjective:   Temperance Keough confused cannot give any history  Objective:   Vital signs  Temperature 98.0 Heart rate 79 Respiratory rate 18 Blood pressure and 123/58 Pulse ox 98 %    Exam Alert awake, confused, laying down in a fetal position  [place].AT,  Supple Neck,No JVD, No cervical lymphadenopathy appreciated.  Tracheostomy noted in midline, increased secretions around tracheostomy site Symmetrical Chest wall movement, decreased breath sounds bilaterally basally, RRR,No Gallops,Rubs or new Murmurs, No Parasternal Heave +ve B.Sounds, Abd protuberant, distended Non tender, No organomegaly appriciated, No rebound - guarding or rigidity. No Cyanosis, Clubbing or edema, No new Rashes or bruise     I&Os--163 Foley - yes   Data Review   CBC  Recent Labs Lab 05/13/14 0655 05/14/14 0520  WBC 17.7* 16.0*  HGB 9.5* 9.3*  HCT 31.2* 31.0*  PLT 194 198  MCV 101.6* 101.3*  MCH 30.9 30.4  MCHC 30.4 30.0  RDW 16.3* 17.0*    Chemistries   Recent Labs Lab 05/13/14 0655 05/14/14 0520  NA 139 142  K 4.3 4.2  CL 99 100  CO2 30 35*  GLUCOSE 139* 150*  BUN 28* 31*  CREATININE 0.45* 0.43*  CALCIUM 8.7 9.1  AST  --  20  ALT  --  23  ALKPHOS  --  156*  BILITOT  --  0.2*   ------------------------------------------------------------------------------------------------------------------ CrCl is unknown because both a height and  weight (above a minimum accepted value) are required for this calculation. ------------------------------------------------------------------------------------------------------------------ No results found for this basename: HGBA1C,  in the last 72 hours ------------------------------------------------------------------------------------------------------------------ No results found for this basename: CHOL, HDL, LDLCALC, TRIG, CHOLHDL, LDLDIRECT,  in the last 72 hours ------------------------------------------------------------------------------------------------------------------ No results found for this basename: TSH, T4TOTAL, FREET3, T3FREE, THYROIDAB,  in the last 72 hours ------------------------------------------------------------------------------------------------------------------ No results found for this basename: VITAMINB12, FOLATE, FERRITIN, TIBC, IRON, RETICCTPCT,  in the last 72 hours  Coagulation profile No results found for this basename: INR, PROTIME,  in the last 168 hours  No results found for this basename: DDIMER,  in the last 72 hours  Cardiac Enzymes No results found for this basename: CK, CKMB, TROPONINI, MYOGLOBIN,  in the last 168 hours ------------------------------------------------------------------------------------------------------------------ No components found with this basename: POCBNP,   Micro Results Recent Results (from the past 240 hour(s))  CULTURE, RESPIRATORY (NON-EXPECTORATED)     Status: None   Collection Time    05/18/14  8:03 PM      Result Value Ref Range Status   Specimen Description TRACHEAL ASPIRATE  Final   Special Requests NONE   Final   Gram Stain     Final   Value: MODERATE WBC PRESENT,BOTH PMN AND MONONUCLEAR     NO SQUAMOUS EPITHELIAL CELLS SEEN     ABUNDANT GRAM POSITIVE COCCI     IN PAIRS IN CLUSTERS     Performed at [facility]   Culture PENDING   Incomplete   Report Status PENDING   Incomplete        Assessment & Plan  SIRS;  resolved Respiratory failure status post tracheostomy , ventilator dependence , weaning unsuccessful, increased secretions growing G+ cocci Anemia, severe-status post blood transfusion PEA/cardiac arrest with left pneumothorax status post chest tube placement on the left, removed Chronic sacral decubitus ulcer with MRSA  treated  History of protein calorie malnutrition/dysphagia on tube feeding  Hypertension controlled Dementia , advanced on Aricept Osteoarthritis pain control Multiple and chronic thoracic vertebral compression fractures IV fentanyl PRN Generalized weakness continue with PT/OT  Hypernatremia resolved  Thrombocytopenia resolved Hypokalemia resolved Ileus resolved Bacteremia with staph aureus treated Sputum culture positive for MRSA treated Bradycardia; resolved G -tube Infection on Bacitracin   Plan   start doxycycline for G-tube infection and gram-positive cocci in tracheal site   Code Status: Full  DVT Prophylaxis  heparin   Carron Curie M.D on 05/19/2014 at 12:31 PM

## 2014-05-20 ENCOUNTER — Other Ambulatory Visit (HOSPITAL_COMMUNITY): Payer: Self-pay

## 2014-05-20 MED ORDER — IOHEXOL 300 MG/ML  SOLN
50.0000 mL | Freq: Once | INTRAMUSCULAR | Status: AC | PRN
  Administered 2014-05-20: 50 mL via ORAL

## 2014-05-20 NOTE — Progress Notes (Addendum)
[facility]                                                                                              Progress note     Patient Demographics  Katelyn BruinDelsenia Rojas, is a 73 y.o. female  WUJ:[id number]  [id number]  DOB - 06/13/1941  Admit date - 03/24/2014  Admitting Physician Carron CurieAli Wladyslaw Henrichs, MD  Outpatient Primary MD for the patient is [name],[name], MD  LOS - 57   Chief complaint    Respiratory failure    Protein calorie malnutrition         Subjective:   Katelyn Rojas confused cannot give any history  Objective:   Vital signs  Temperature 98.4 Heart rate 76 Respiratory rate 18 Blood pressure and 102/54 Pulse ox 94 %    Exam Alert awake, confused, laying down in a fetal position  [place].AT,  Supple Neck,No JVD, No cervical lymphadenopathy appreciated.  Tracheostomy noted in midline, increased secretions around tracheostomy site Symmetrical Chest wall movement, decreased breath sounds bilaterally basally, RRR,No Gallops,Rubs or new Murmurs, No Parasternal Heave +ve B.Sounds, Abd protuberant, distended Non tender, No organomegaly appriciated, No rebound - guarding or rigidity. No Cyanosis, Clubbing or edema, No new Rashes or bruise     I&Os  +848 Foley - yes   Data Review   CBC  Recent Labs Lab 05/14/14 0520 05/19/14 1330  WBC 16.0* 13.0*  HGB 9.3* 8.7*  HCT 31.0* 29.3*  PLT 198 231  MCV 101.3* 103.2*  MCH 30.4 30.6  MCHC 30.0 29.7*  RDW 17.0* 18.5*    Chemistries   Recent Labs Lab 05/14/14 0520 05/19/14 1330  NA 142 142  K 4.2 5.4*  CL 100 96  CO2 35* 39*  GLUCOSE 150* 130*  BUN 31* 58*  CREATININE 0.43* 0.47*  CALCIUM 9.1 9.3  AST 20  --   ALT 23  --   ALKPHOS 156*  --   BILITOT 0.2*  --    ------------------------------------------------------------------------------------------------------------------ CrCl is unknown because both a height  and weight (above a minimum accepted value) are required for this calculation. ------------------------------------------------------------------------------------------------------------------ No results found for this basename: HGBA1C,  in the last 72 hours ------------------------------------------------------------------------------------------------------------------ No results found for this basename: CHOL, HDL, LDLCALC, TRIG, CHOLHDL, LDLDIRECT,  in the last 72 hours ------------------------------------------------------------------------------------------------------------------ No results found for this basename: TSH, T4TOTAL, FREET3, T3FREE, THYROIDAB,  in the last 72 hours ------------------------------------------------------------------------------------------------------------------ No results found for this basename: VITAMINB12, FOLATE, FERRITIN, TIBC, IRON, RETICCTPCT,  in the last 72 hours  Coagulation profile No results found for this basename: INR, PROTIME,  in the last 168 hours  No results found for this basename: DDIMER,  in the last 72 hours  Cardiac Enzymes No results found for this basename: CK, CKMB, TROPONINI, MYOGLOBIN,  in the last 168 hours ------------------------------------------------------------------------------------------------------------------ No components found with this basename: POCBNP,   Micro Results Recent Results (from the past 240 hour(s))  CULTURE, RESPIRATORY (NON-EXPECTORATED)     Status: None   Collection Time    05/18/14  8:03 PM      Result Value Ref Range Status   Specimen Description  TRACHEAL ASPIRATE   Final   Special Requests NONE   Final   Gram Stain     Final   Value: MODERATE WBC PRESENT,BOTH PMN AND MONONUCLEAR     NO SQUAMOUS EPITHELIAL CELLS SEEN     ABUNDANT GRAM POSITIVE COCCI     IN PAIRS IN CLUSTERS     Performed at [facility]   Culture     Final   Value: ABUNDANT STAPHYLOCOCCUS AUREUS     Note:  RIFAMPIN AND GENTAMICIN SHOULD NOT BE USED AS SINGLE DRUGS FOR TREATMENT OF STAPH INFECTIONS.     Performed at [facility]   Report Status PENDING   Incomplete       Assessment & Plan  SIRS;  resolved Respiratory failure status post tracheostomy , ventilator dependence , weaning unsuccessful, increased secretions growing G+ cocci Anemia, severe-status post blood transfusion PEA/cardiac arrest with left pneumothorax status post chest tube placement on the left, removed Chronic sacral decubitus ulcer with MRSA  treated  History of protein calorie malnutrition/dysphagia on tube feeding  Hypertension controlled Dementia , advanced on Aricept Osteoarthritis pain control Multiple and chronic thoracic vertebral compression fractures IV fentanyl PRN Generalized weakness continue with PT/OT  Hypernatremia resolved  Thrombocytopenia resolved Hypokalemia resolved Ileus resolved Bacteremia with staph aureus treated Sputum culture positive for MRSA treated Bradycardia; resolved G -tube Infection on Bacitracin foul-smelling discharge/? Leakage   Plan  Continue with doxycycline  culture G-tube secretions  Check abdominal x-ray with Gastrografin  Flagyl per tube   Code Status: Full  DVT Prophylaxis  heparin   Carron Curie M.D on 05/20/2014 at 12:44 PM

## 2014-05-21 ENCOUNTER — Other Ambulatory Visit (HOSPITAL_COMMUNITY): Payer: Self-pay

## 2014-05-21 NOTE — Progress Notes (Addendum)
[facility]                                                                                              Progress note     Patient Demographics  Katelyn Rojas, is a 73 y.o. female  ZOX:[id number]  WJX:[id number]  DOB - 01/28/1941  Admit date - 03/24/2014  Admitting Physician Carron CurieAli Liley Rake, MD  Outpatient Primary MD for the patient is Katelyn,[name], MD  LOS - 58   Chief complaint    Respiratory failure    Protein calorie malnutrition         Subjective:   Katelyn Rojas confused cannot give any history  Objective:   Vital signs  Temperature 99.5 Heart rate 11 Respiratory rate 18 Blood pressure and was removed tub9 Pulse ox 94 %    Exam Alert awake, confused, laying down in a fetal position  [place].AT,  Supple Neck,No JVD, No cervical lymphadenopathy appreciated.  Tracheostomy noted in midline, increased secretions around tracheostomy site Symmetrical Chest wall movement, decreased breath sounds bilaterally basally, RRR,No Gallops,Rubs or new Murmurs, No Parasternal Heave +ve B.Sounds, Abd protuberant, distended Non tender, No organomegaly appriciated, No rebound - guarding or rigidity. No Cyanosis, Clubbing or edema, No new Rashes or bruise     I&Os  +848 Foley - yes   Data Review   CBC  Recent Labs Lab 05/19/14 1330  WBC 13.0*  HGB 8.7*  HCT 29.3*  PLT 231  MCV 103.2*  MCH 30.6  MCHC 29.7*  RDW 18.5*    Chemistries   Recent Labs Lab 05/19/14 1330  NA 142  K 5.4*  CL 96  CO2 39*  GLUCOSE 130*  BUN 58*  CREATININE 0.47*  CALCIUM 9.3   ------------------------------------------------------------------------------------------------------------------ CrCl is unknown because both a height and weight (above a minimum accepted value) are required for this  calculation. ------------------------------------------------------------------------------------------------------------------ No results found for this basename: HGBA1C,  in the last 72 hours ------------------------------------------------------------------------------------------------------------------ No results found for this basename: CHOL, HDL, LDLCALC, TRIG, CHOLHDL, LDLDIRECT,  in the last 72 hours ------------------------------------------------------------------------------------------------------------------ No results found for this basename: TSH, T4TOTAL, FREET3, T3FREE, THYROIDAB,  in the last 72 hours ------------------------------------------------------------------------------------------------------------------ No results found for this basename: VITAMINB12, FOLATE, FERRITIN, TIBC, IRON, RETICCTPCT,  in the last 72 hours  Coagulation profile No results found for this basename: INR, PROTIME,  in the last 168 hours  No results found for this basename: DDIMER,  in the last 72 hours  Cardiac Enzymes No results found for this basename: CK, CKMB, TROPONINI, MYOGLOBIN,  in the last 168 hours ------------------------------------------------------------------------------------------------------------------ No components found with this basename: POCBNP,   Micro Results Recent Results (from the past 240 hour(s))  CULTURE, RESPIRATORY (NON-EXPECTORATED)     Status: None   Collection Time    05/18/14  8:03 PM      Result Value Ref Range Status   Specimen Description TRACHEAL ASPIRATE   Final   Special Requests NONE   Final   Gram Stain     Final   Value: MODERATE WBC PRESENT,BOTH PMN AND MONONUCLEAR     NO SQUAMOUS EPITHELIAL CELLS SEEN  ABUNDANT GRAM POSITIVE COCCI     IN PAIRS IN CLUSTERS     Performed at [facility]   Culture     Final   Value: ABUNDANT METHICILLIN RESISTANT STAPHYLOCOCCUS AUREUS     Note: RIFAMPIN AND GENTAMICIN SHOULD NOT BE USED AS  SINGLE DRUGS FOR TREATMENT OF STAPH INFECTIONS. CRITICAL RESULT CALLED TO, READ BACK BY AND VERIFIED WITH: BOBBI@7 :18AM ON 05/21/14 BY DANTS     ABUNDANT GRAM NEGATIVE RODS     Performed at [facility]   Report Status PENDING   Incomplete   Organism ID, Bacteria METHICILLIN RESISTANT STAPHYLOCOCCUS AUREUS   Final       Assessment & Plan  SIRS;  resolved Respiratory failure status post tracheostomy , ventilator dependence , weaning unsuccessful, increased secretions growing [place]  Anemia, severe-status post blood transfusion PEA/cardiac arrest with left pneumothorax status post chest tube placement on the left, removed Chronic sacral decubitus ulcer with MRSA  treated  History of protein calorie malnutrition/dysphagia on tube feeding  Hypertension controlled Dementia , advanced on Aricept Osteoarthritis pain control Multiple and chronic thoracic vertebral compression fractures IV fentanyl PRN Generalized weakness continue with PT/OT  Hypernatremia resolved  Thrombocytopenia resolved Hypokalemia resolved Ileus resolved Bacteremia with staph aureus treated Sputum culture positive for MRSA treated Bradycardia; resolved G -tube Infection on Bacitracin foul-smelling discharge/? Leakage awaiting culture  Plan  DC doxycycline   culture G-tube secretions   Vancomycin IV x8 days    Code Status: Full  DVT Prophylaxis  heparin   Carron Curie M.D on 05/21/2014 at 12:05 PM

## 2014-05-22 ENCOUNTER — Other Ambulatory Visit (HOSPITAL_COMMUNITY): Payer: Medicare Other

## 2014-05-22 LAB — CULTURE, RESPIRATORY

## 2014-05-22 LAB — CULTURE, RESPIRATORY W GRAM STAIN

## 2014-05-22 MED ORDER — IOHEXOL 300 MG/ML  SOLN
50.0000 mL | Freq: Once | INTRAMUSCULAR | Status: AC | PRN
  Administered 2014-05-22: 10 mL

## 2014-05-22 NOTE — Procedures (Signed)
Appropriately positioned and functioning pull through G-tube.  No exchange performed.

## 2014-05-22 NOTE — Progress Notes (Addendum)
[facility]                                                                                              Progress note     Patient Demographics  Katelyn Rojas, is a 73 y.o. female  [id number]  [id number]  DOB - 1941/03/03  Admit date - 03/24/2014  Admitting Physician Carron Curie, MD  Outpatient Primary MD for the patient is [name],[name], MD  LOS - 59   Chief complaint    Respiratory failure    Protein calorie malnutrition         Subjective:   Katelyn Rojas confused cannot give any history  Objective:   Vital signs  Temperature 99.7 Heart rate 91 Respiratory rate 18 Blood pressure 105/64 Pulse ox 9 6 %    Exam Alert awake, confused, laying down in a fetal position  [place].AT,  Supple Neck,No JVD, No cervical lymphadenopathy appreciated.  Tracheostomy noted in midline, increased secretions around tracheostomy site Symmetrical Chest wall movement, decreased breath sounds bilaterally basally, RRR,No Gallops,Rubs or new Murmurs, No Parasternal Heave +ve B.Sounds, Abd protuberant, distended Non tender, No organomegaly appriciated, No rebound - guarding or rigidity. No Cyanosis, Clubbing or edema, No new Rashes or bruise     I&Os  unknown Foley - yes   Data Review   CBC  Recent Labs Lab 05/19/14 1330  WBC 13.0*  HGB 8.7*  HCT 29.3*  PLT 231  MCV 103.2*  MCH 30.6  MCHC 29.7*  RDW 18.5*    Chemistries   Recent Labs Lab 05/19/14 1330  NA 142  K 5.4*  CL 96  CO2 39*  GLUCOSE 130*  BUN 58*  CREATININE 0.47*  CALCIUM 9.3   ------------------------------------------------------------------------------------------------------------------ CrCl is unknown because both a height and weight (above a minimum accepted value) are required for this  calculation. ------------------------------------------------------------------------------------------------------------------ No results found for this basename: HGBA1C,  in the last 72 hours ------------------------------------------------------------------------------------------------------------------ No results found for this basename: CHOL, HDL, LDLCALC, TRIG, CHOLHDL, LDLDIRECT,  in the last 72 hours ------------------------------------------------------------------------------------------------------------------ No results found for this basename: TSH, T4TOTAL, FREET3, T3FREE, THYROIDAB,  in the last 72 hours ------------------------------------------------------------------------------------------------------------------ No results found for this basename: VITAMINB12, FOLATE, FERRITIN, TIBC, IRON, RETICCTPCT,  in the last 72 hours  Coagulation profile No results found for this basename: INR, PROTIME,  in the last 168 hours  No results found for this basename: DDIMER,  in the last 72 hours  Cardiac Enzymes No results found for this basename: CK, CKMB, TROPONINI, MYOGLOBIN,  in the last 168 hours ------------------------------------------------------------------------------------------------------------------ No components found with this basename: POCBNP,   Micro Results Recent Results (from the past 240 hour(s))  CULTURE, RESPIRATORY (NON-EXPECTORATED)     Status: None   Collection Time    05/18/14  8:03 PM      Result Value Ref Range Status   Specimen Description TRACHEAL ASPIRATE   Final   Special Requests NONE   Final   Gram Stain     Final   Value: MODERATE WBC PRESENT,BOTH PMN AND MONONUCLEAR     NO SQUAMOUS EPITHELIAL CELLS SEEN  ABUNDANT GRAM POSITIVE COCCI     IN PAIRS IN CLUSTERS     Performed at [facility]   Culture     Final   Value: ABUNDANT METHICILLIN RESISTANT STAPHYLOCOCCUS AUREUS     Note: RIFAMPIN AND GENTAMICIN SHOULD NOT BE USED AS  SINGLE DRUGS FOR TREATMENT OF STAPH INFECTIONS. CRITICAL RESULT CALLED TO, READ BACK BY AND VERIFIED WITH: BOBBI@7 :18AM ON 05/21/14 BY DANTS     ABUNDANT ALCALIGENES XYLOSOXIDANS     Performed at [facility]   Report Status 05/22/2014 FINAL   Final   Organism ID, Bacteria METHICILLIN RESISTANT STAPHYLOCOCCUS AUREUS   Final   Organism ID, Bacteria ALCALIGENES XYLOSOXIDANS   Final       Assessment & Plan  SIRS;  resolved Respiratory failure status post tracheostomy , ventilator dependence , weaning unsuccessful, increased secretions growing Mersa and Alcaligenes sensitive to Cipro Anemia, severe-status post blood transfusion PEA/cardiac arrest with left pneumothorax status post chest tube placement on the left, removed Chronic sacral decubitus ulcer with MRSA  treated  History of protein calorie malnutrition/dysphagia on tube feeding  Hypertension controlled Dementia , advanced on Aricept Osteoarthritis pain control Multiple and chronic thoracic vertebral compression fractures IV fentanyl PRN Generalized weakness continue with PT/OT  Hypernatremia resolved  Thrombocytopenia resolved Hypokalemia resolved Ileus resolved Bacteremia with staph aureus treated Sputum culture positive for MRSA treated Bradycardia; resolved G -tube Infection on Bacitracin foul-smelling discharge/? Leakage awaiting culture  Plan  Start Cipro IV twice a day  for G-tube exchange Check labs, chest x-ray in a.m.  critical care time 37 minutes Code Status: Full  DVT Prophylaxis  heparin   Carron CurieHijazi, Storm Sovine M.D on 05/22/2014 at 11:13 AM

## 2014-05-23 NOTE — Progress Notes (Signed)
[facility]                                                                                              Progress note     Patient Demographics  Katelyn Rojas, is a 73 y.o. female  [id number]:[id number]  [id number]  DOB - 06-09-1941  Admit date - 03/24/2014  Admitting Physician Carron Curie, MD  Outpatient Primary MD for the patient is [name],[name], MD  LOS - 60   Chief complaint    Respiratory failure    Protein calorie malnutrition         Subjective:   Katelyn Rojas confused cannot give any history  Objective:   Vital signs  Temperature 99.5 Heart rate 89 Respiratory rate 25 Blood pressure 157/59 Pulse ox 95%    Exam Alert awake, confused, laying down in a fetal position  [place].AT,  Supple Neck,No JVD, No cervical lymphadenopathy appreciated.  Tracheostomy noted in midline, increased secretions around tracheostomy site Symmetrical Chest wall movement, decreased breath sounds bilaterally basally, RRR,No Gallops,Rubs or new Murmurs, No Parasternal Heave +ve B.Sounds, Abd protuberant, distended Non tender, No organomegaly appriciated, No rebound - guarding or rigidity. No Cyanosis, Clubbing or edema, No new Rashes or bruise     I&Os  unknown Foley - yes   Data Review   CBC  Recent Labs Lab 05/19/14 1330  WBC 13.0*  HGB 8.7*  HCT 29.3*  PLT 231  MCV 103.2*  MCH 30.6  MCHC 29.7*  RDW 18.5*    Chemistries   Recent Labs Lab 05/19/14 1330  NA 142  K 5.4*  CL 96  CO2 39*  GLUCOSE 130*  BUN 58*  CREATININE 0.47*  CALCIUM 9.3   ------------------------------------------------------------------------------------------------------------------ CrCl is unknown because both a height and weight (above a minimum accepted value) are required for this  calculation. ------------------------------------------------------------------------------------------------------------------ No results found for this basename: HGBA1C,  in the last 72 hours ------------------------------------------------------------------------------------------------------------------ No results found for this basename: CHOL, HDL, LDLCALC, TRIG, CHOLHDL, LDLDIRECT,  in the last 72 hours ------------------------------------------------------------------------------------------------------------------ No results found for this basename: TSH, T4TOTAL, FREET3, T3FREE, THYROIDAB,  in the last 72 hours ------------------------------------------------------------------------------------------------------------------ No results found for this basename: VITAMINB12, FOLATE, FERRITIN, TIBC, IRON, RETICCTPCT,  in the last 72 hours  Coagulation profile No results found for this basename: INR, PROTIME,  in the last 168 hours  No results found for this basename: DDIMER,  in the last 72 hours  Cardiac Enzymes No results found for this basename: CK, CKMB, TROPONINI, MYOGLOBIN,  in the last 168 hours ------------------------------------------------------------------------------------------------------------------ No components found with this basename: POCBNP,   Micro Results Recent Results (from the past 240 hour(s))  CULTURE, RESPIRATORY (NON-EXPECTORATED)     Status: None   Collection Time    05/18/14  8:03 PM      Result Value Ref Range Status   Specimen Description TRACHEAL ASPIRATE   Final   Special Requests NONE   Final   Gram Stain     Final   Value: MODERATE WBC PRESENT,BOTH PMN AND MONONUCLEAR     NO SQUAMOUS EPITHELIAL CELLS SEEN     ABUNDANT GRAM  POSITIVE COCCI     IN PAIRS IN CLUSTERS     Performed at [facility]   Culture     Final   Value: ABUNDANT METHICILLIN RESISTANT STAPHYLOCOCCUS AUREUS     Note: RIFAMPIN AND GENTAMICIN SHOULD NOT BE USED AS  SINGLE DRUGS FOR TREATMENT OF STAPH INFECTIONS. CRITICAL RESULT CALLED TO, READ BACK BY AND VERIFIED WITH: BOBBI@7 :18AM ON 05/21/14 BY DANTS     ABUNDANT ALCALIGENES XYLOSOXIDANS     Performed at [facility]   Report Status 05/22/2014 FINAL   Final   Organism ID, Bacteria METHICILLIN RESISTANT STAPHYLOCOCCUS AUREUS   Final   Organism ID, Bacteria ALCALIGENES XYLOSOXIDANS   Final       Assessment & Plan  SIRS;  resolved Respiratory failure status post tracheostomy , ventilator dependence , weaning unsuccessful, increased secretions growing Mersa and Alcaligenes sensitive to Cipro Anemia, severe-status post blood transfusion PEA/cardiac arrest with left pneumothorax status post chest tube placement on the left, removed Chronic sacral decubitus ulcer with MRSA  treated  History of protein calorie malnutrition/dysphagia on tube feeding  Hypertension controlled Dementia , advanced on Aricept Osteoarthritis pain control Multiple and chronic thoracic vertebral compression fractures IV fentanyl PRN Generalized weakness continue with PT/OT  Hypernatremia resolved  Thrombocytopenia resolved Hypokalemia resolved Ileus resolved Bacteremia with staph aureus treated Sputum culture positive for MRSA treated Bradycardia; resolved G -tube Infection on Bacitracin foul-smelling discharge/? Leakage awaiting culture status post G-tube exchange  Plan  S/P tube exchange Continue with IV antibiotics  Code Status: Full  DVT Prophylaxis  heparin   Carron CurieHijazi, Jahmier Willadsen M.D on 05/23/2014 at 11:41 AM

## 2014-05-24 NOTE — Progress Notes (Signed)
[facility]                                                                                              Progress note     Patient Demographics  Katelyn Rojas, is a 73 y.o. female  RXY:[id number]  [id number]:[id number]  DOB - [birth year]  Admit date - 03/24/2014  Admitting Physician Carron Curie, MD  Outpatient Primary MD for the patient is Katelyn,[name], MD  LOS - 61   Chief complaint    Respiratory failure    Protein calorie malnutrition         Subjective:   Katelyn Rojas confused cannot give any history  Objective:   Vital signs  Temperature 99.9 Heart rate 93 Respiratory rate 18 Blood pressure 103/51 Pulse ox 97%    Exam Alert awake, confused, laying down in a fetal position  [place].AT,  Supple Neck,No JVD, No cervical lymphadenopathy appreciated.  Tracheostomy noted in midline, increased secretions around tracheostomy site Symmetrical Chest wall movement, decreased breath sounds bilaterally basally, RRR,No Gallops,Rubs or new Murmurs, No Parasternal Heave +ve B.Sounds, Abd protuberant, distended Non tender, No organomegaly appriciated, No rebound - guarding or rigidity. No Cyanosis, Clubbing or edema, No new Rashes or bruise     I&Os  unknown Foley - yes   Data Review   CBC  Recent Labs Lab 05/19/14 1330  WBC 13.0*  HGB 8.7*  HCT 29.3*  PLT 231  MCV 103.2*  MCH 30.6  MCHC 29.7*  RDW 18.5*    Chemistries   Recent Labs Lab 05/19/14 1330  NA 142  K 5.4*  CL 96  CO2 39*  GLUCOSE 130*  BUN 58*  CREATININE 0.47*  CALCIUM 9.3   ------------------------------------------------------------------------------------------------------------------ CrCl is unknown because both a height and weight (above a minimum accepted value) are required for this  calculation. ------------------------------------------------------------------------------------------------------------------ No results found for this basename: HGBA1C,  in the last 72 hours ------------------------------------------------------------------------------------------------------------------ No results found for this basename: CHOL, HDL, LDLCALC, TRIG, CHOLHDL, LDLDIRECT,  in the last 72 hours ------------------------------------------------------------------------------------------------------------------ No results found for this basename: TSH, T4TOTAL, FREET3, T3FREE, THYROIDAB,  in the last 72 hours ------------------------------------------------------------------------------------------------------------------ No results found for this basename: VITAMINB12, FOLATE, FERRITIN, TIBC, IRON, RETICCTPCT,  in the last 72 hours  Coagulation profile No results found for this basename: INR, PROTIME,  in the last 168 hours  No results found for this basename: DDIMER,  in the last 72 hours  Cardiac Enzymes No results found for this basename: CK, CKMB, TROPONINI, MYOGLOBIN,  in the last 168 hours ------------------------------------------------------------------------------------------------------------------ No components found with this basename: POCBNP,   Micro Results Recent Results (from the past 240 hour(s))  CULTURE, RESPIRATORY (NON-EXPECTORATED)     Status: None   Collection Time    05/18/14  8:03 PM      Result Value Ref Range Status   Specimen Description TRACHEAL ASPIRATE   Final   Special Requests NONE   Final   Gram Stain     Final   Value: MODERATE WBC PRESENT,BOTH PMN AND MONONUCLEAR     NO SQUAMOUS EPITHELIAL CELLS SEEN     ABUNDANT GRAM  POSITIVE COCCI     IN PAIRS IN CLUSTERS     Performed at [facility]   Culture     Final   Value: ABUNDANT METHICILLIN RESISTANT STAPHYLOCOCCUS AUREUS     Note: RIFAMPIN AND GENTAMICIN SHOULD NOT BE USED AS  SINGLE DRUGS FOR TREATMENT OF STAPH INFECTIONS. CRITICAL RESULT CALLED TO, READ BACK BY AND VERIFIED WITH: BOBBI@7 :18AM ON 05/21/14 BY DANTS     ABUNDANT ALCALIGENES XYLOSOXIDANS     Performed at [facility]   Report Status 05/22/2014 FINAL   Final   Organism ID, Bacteria METHICILLIN RESISTANT STAPHYLOCOCCUS AUREUS   Final   Organism ID, Bacteria ALCALIGENES XYLOSOXIDANS   Final       Assessment & Plan  SIRS;  resolved Respiratory failure status post tracheostomy , ventilator dependence , weaning unsuccessful, increased secretions growing Mersa and Alcaligenes sensitive to Cipro Anemia, severe-status post blood transfusion PEA/cardiac arrest with left pneumothorax status post chest tube placement on the left, removed Chronic sacral decubitus ulcer with MRSA  treated  History of protein calorie malnutrition/dysphagia on tube feeding  Hypertension controlled Dementia , advanced on Aricept Osteoarthritis pain control Multiple and chronic thoracic vertebral compression fractures IV fentanyl PRN Generalized weakness continue with PT/OT  Hypernatremia resolved  Thrombocytopenia resolved Hypokalemia resolved Ileus resolved Bacteremia with staph aureus treated Sputum culture positive for MRSA treated Bradycardia; resolved G -tube Infection on Bacitracin foul-smelling discharge/? Leakage awaiting culture status post G-tube exchange on 7/10  Plan   Continue with IV antibiotics  Code Status: Full  DVT Prophylaxis  heparin   Carron CurieHijazi, Nataliah Hatlestad M.D on 05/24/2014 at 2:37 PM

## 2014-05-25 ENCOUNTER — Ambulatory Visit (HOSPITAL_COMMUNITY)
Admission: AD | Admit: 2014-05-25 | Discharge: 2014-05-25 | Disposition: A | Discharge status: Home / Self Care | Payer: Medicare Other | Source: Other Acute Inpatient Hospital | Attending: Internal Medicine | Admitting: Internal Medicine

## 2014-05-25 DIAGNOSIS — Z9911 Dependence on respirator [ventilator] status: Secondary | ICD-10-CM | POA: Insufficient documentation

## 2014-05-25 LAB — CBC
HCT: 28.5 % — ABNORMAL LOW (ref 36.0–46.0)
Hemoglobin: 8.3 g/dL — ABNORMAL LOW (ref 12.0–15.0)
MCH: 31.4 pg (ref 26.0–34.0)
MCHC: 29.1 g/dL — AB (ref 30.0–36.0)
MCV: 108 fL — AB (ref 78.0–100.0)
PLATELETS: 231 10*3/uL (ref 150–400)
RBC: 2.64 MIL/uL — AB (ref 3.87–5.11)
RDW: 18.1 % — ABNORMAL HIGH (ref 11.5–15.5)
WBC: 10.4 10*3/uL (ref 4.0–10.5)

## 2014-05-25 LAB — COMPREHENSIVE METABOLIC PANEL WITH GFR
ALT: 18 U/L (ref 0–35)
AST: 20 U/L (ref 0–37)
Albumin: 2.9 g/dL — ABNORMAL LOW (ref 3.5–5.2)
Alkaline Phosphatase: 125 U/L — ABNORMAL HIGH (ref 39–117)
Anion gap: 7 (ref 5–15)
BUN: 53 mg/dL — ABNORMAL HIGH (ref 6–23)
CO2: 36 meq/L — ABNORMAL HIGH (ref 19–32)
Calcium: 9.2 mg/dL (ref 8.4–10.5)
Chloride: 107 meq/L (ref 96–112)
Creatinine, Ser: 0.59 mg/dL (ref 0.50–1.10)
GFR calc Af Amer: >90 mL/min
GFR calc non Af Amer: >90 mL/min
Glucose, Bld: 129 mg/dL — ABNORMAL HIGH (ref 70–99)
Potassium: 4.8 meq/L (ref 3.7–5.3)
Sodium: 150 meq/L — ABNORMAL HIGH (ref 137–147)
Total Bilirubin: 0.2 mg/dL — ABNORMAL LOW (ref 0.3–1.2)
Total Protein: 6.7 g/dL (ref 6.0–8.3)

## 2014-06-17 ENCOUNTER — Emergency Department (HOSPITAL_COMMUNITY): Payer: Medicare Other

## 2014-06-17 ENCOUNTER — Encounter (HOSPITAL_COMMUNITY): Payer: Self-pay | Admitting: Emergency Medicine

## 2014-06-17 ENCOUNTER — Inpatient Hospital Stay (HOSPITAL_COMMUNITY)
Admission: EM | Admit: 2014-06-17 | Discharge: 2014-06-30 | DRG: 326 | Disposition: A | Discharge status: Other Acute Inpatient Hospital | Payer: Medicare Other | Attending: Internal Medicine | Admitting: Internal Medicine

## 2014-06-17 DIAGNOSIS — R69 Illness, unspecified: Secondary | ICD-10-CM | POA: Diagnosis not present

## 2014-06-17 DIAGNOSIS — F0390 Unspecified dementia without behavioral disturbance: Secondary | ICD-10-CM

## 2014-06-17 DIAGNOSIS — E43 Unspecified severe protein-calorie malnutrition: Secondary | ICD-10-CM

## 2014-06-17 DIAGNOSIS — I10 Essential (primary) hypertension: Secondary | ICD-10-CM | POA: Diagnosis present

## 2014-06-17 DIAGNOSIS — Z79899 Other long term (current) drug therapy: Secondary | ICD-10-CM | POA: Diagnosis not present

## 2014-06-17 DIAGNOSIS — F03918 Unspecified dementia, unspecified severity, with other behavioral disturbance: Secondary | ICD-10-CM

## 2014-06-17 DIAGNOSIS — R0902 Hypoxemia: Secondary | ICD-10-CM

## 2014-06-17 DIAGNOSIS — I498 Other specified cardiac arrhythmias: Secondary | ICD-10-CM | POA: Diagnosis not present

## 2014-06-17 DIAGNOSIS — Z7982 Long term (current) use of aspirin: Secondary | ICD-10-CM | POA: Diagnosis not present

## 2014-06-17 DIAGNOSIS — Z87891 Personal history of nicotine dependence: Secondary | ICD-10-CM

## 2014-06-17 DIAGNOSIS — D72829 Elevated white blood cell count, unspecified: Secondary | ICD-10-CM | POA: Diagnosis not present

## 2014-06-17 DIAGNOSIS — Z9911 Dependence on respirator [ventilator] status: Secondary | ICD-10-CM | POA: Diagnosis not present

## 2014-06-17 DIAGNOSIS — F0391 Unspecified dementia with behavioral disturbance: Secondary | ICD-10-CM

## 2014-06-17 DIAGNOSIS — I428 Other cardiomyopathies: Secondary | ICD-10-CM | POA: Diagnosis present

## 2014-06-17 DIAGNOSIS — J9611 Chronic respiratory failure with hypoxia: Secondary | ICD-10-CM

## 2014-06-17 DIAGNOSIS — L8993 Pressure ulcer of unspecified site, stage 3: Secondary | ICD-10-CM

## 2014-06-17 DIAGNOSIS — IMO0002 Reserved for concepts with insufficient information to code with codable children: Secondary | ICD-10-CM

## 2014-06-17 DIAGNOSIS — Z681 Body mass index (BMI) 19 or less, adult: Secondary | ICD-10-CM

## 2014-06-17 DIAGNOSIS — R1310 Dysphagia, unspecified: Secondary | ICD-10-CM

## 2014-06-17 DIAGNOSIS — K9423 Gastrostomy malfunction: Principal | ICD-10-CM | POA: Diagnosis present

## 2014-06-17 DIAGNOSIS — I9589 Other hypotension: Secondary | ICD-10-CM | POA: Diagnosis present

## 2014-06-17 DIAGNOSIS — R933 Abnormal findings on diagnostic imaging of other parts of digestive tract: Secondary | ICD-10-CM

## 2014-06-17 DIAGNOSIS — E876 Hypokalemia: Secondary | ICD-10-CM | POA: Diagnosis not present

## 2014-06-17 DIAGNOSIS — R5381 Other malaise: Secondary | ICD-10-CM | POA: Diagnosis present

## 2014-06-17 DIAGNOSIS — F411 Generalized anxiety disorder: Secondary | ICD-10-CM | POA: Diagnosis present

## 2014-06-17 DIAGNOSIS — Z8674 Personal history of sudden cardiac arrest: Secondary | ICD-10-CM

## 2014-06-17 DIAGNOSIS — E119 Type 2 diabetes mellitus without complications: Secondary | ICD-10-CM | POA: Diagnosis present

## 2014-06-17 DIAGNOSIS — L89109 Pressure ulcer of unspecified part of back, unspecified stage: Secondary | ICD-10-CM | POA: Diagnosis present

## 2014-06-17 DIAGNOSIS — L89153 Pressure ulcer of sacral region, stage 3: Secondary | ICD-10-CM

## 2014-06-17 DIAGNOSIS — J4489 Other specified chronic obstructive pulmonary disease: Secondary | ICD-10-CM | POA: Diagnosis present

## 2014-06-17 DIAGNOSIS — R64 Cachexia: Secondary | ICD-10-CM | POA: Diagnosis present

## 2014-06-17 DIAGNOSIS — Z8614 Personal history of Methicillin resistant Staphylococcus aureus infection: Secondary | ICD-10-CM

## 2014-06-17 DIAGNOSIS — L8994 Pressure ulcer of unspecified site, stage 4: Secondary | ICD-10-CM | POA: Diagnosis present

## 2014-06-17 DIAGNOSIS — J9610 Chronic respiratory failure, unspecified whether with hypoxia or hypercapnia: Secondary | ICD-10-CM

## 2014-06-17 DIAGNOSIS — J9601 Acute respiratory failure with hypoxia: Secondary | ICD-10-CM

## 2014-06-17 DIAGNOSIS — R1013 Epigastric pain: Secondary | ICD-10-CM

## 2014-06-17 DIAGNOSIS — R109 Unspecified abdominal pain: Secondary | ICD-10-CM | POA: Diagnosis present

## 2014-06-17 DIAGNOSIS — Z930 Tracheostomy status: Secondary | ICD-10-CM | POA: Diagnosis not present

## 2014-06-17 DIAGNOSIS — J439 Emphysema, unspecified: Secondary | ICD-10-CM

## 2014-06-17 DIAGNOSIS — R531 Weakness: Secondary | ICD-10-CM

## 2014-06-17 DIAGNOSIS — Y833 Surgical operation with formation of external stoma as the cause of abnormal reaction of the patient, or of later complication, without mention of misadventure at the time of the procedure: Secondary | ICD-10-CM | POA: Diagnosis present

## 2014-06-17 DIAGNOSIS — J438 Other emphysema: Secondary | ICD-10-CM

## 2014-06-17 DIAGNOSIS — D649 Anemia, unspecified: Secondary | ICD-10-CM | POA: Diagnosis present

## 2014-06-17 DIAGNOSIS — Z515 Encounter for palliative care: Secondary | ICD-10-CM

## 2014-06-17 DIAGNOSIS — J449 Chronic obstructive pulmonary disease, unspecified: Secondary | ICD-10-CM | POA: Diagnosis present

## 2014-06-17 DIAGNOSIS — K9429 Other complications of gastrostomy: Secondary | ICD-10-CM

## 2014-06-17 DIAGNOSIS — R636 Underweight: Secondary | ICD-10-CM

## 2014-06-17 DIAGNOSIS — K9420 Gastrostomy complication, unspecified: Secondary | ICD-10-CM

## 2014-06-17 DIAGNOSIS — J411 Mucopurulent chronic bronchitis: Secondary | ICD-10-CM

## 2014-06-17 DIAGNOSIS — K631 Perforation of intestine (nontraumatic): Secondary | ICD-10-CM

## 2014-06-17 DIAGNOSIS — J189 Pneumonia, unspecified organism: Secondary | ICD-10-CM

## 2014-06-17 DIAGNOSIS — I420 Dilated cardiomyopathy: Secondary | ICD-10-CM

## 2014-06-17 LAB — CBC
HCT: 23.8 % — ABNORMAL LOW (ref 36.0–46.0)
HCT: 27.9 % — ABNORMAL LOW (ref 36.0–46.0)
HEMOGLOBIN: 7.3 g/dL — AB (ref 12.0–15.0)
HEMOGLOBIN: 8.4 g/dL — AB (ref 12.0–15.0)
MCH: 31.1 pg (ref 26.0–34.0)
MCH: 31.6 pg (ref 26.0–34.0)
MCHC: 30.1 g/dL (ref 30.0–36.0)
MCHC: 30.7 g/dL (ref 30.0–36.0)
MCV: 103 fL — ABNORMAL HIGH (ref 78.0–100.0)
MCV: 103.3 fL — AB (ref 78.0–100.0)
Platelets: 247 10*3/uL (ref 150–400)
Platelets: 290 10*3/uL (ref 150–400)
RBC: 2.31 MIL/uL — AB (ref 3.87–5.11)
RBC: 2.7 MIL/uL — ABNORMAL LOW (ref 3.87–5.11)
RDW: 15.3 % (ref 11.5–15.5)
RDW: 15.4 % (ref 11.5–15.5)
WBC: 7.5 10*3/uL (ref 4.0–10.5)
WBC: 9.9 10*3/uL (ref 4.0–10.5)

## 2014-06-17 LAB — COMPREHENSIVE METABOLIC PANEL
ALK PHOS: 146 U/L — AB (ref 39–117)
ALK PHOS: 172 U/L — AB (ref 39–117)
ALT: 27 U/L (ref 0–35)
ALT: 30 U/L (ref 0–35)
AST: 23 U/L (ref 0–37)
AST: 25 U/L (ref 0–37)
Albumin: 2.5 g/dL — ABNORMAL LOW (ref 3.5–5.2)
Albumin: 3.1 g/dL — ABNORMAL LOW (ref 3.5–5.2)
Anion gap: 13 (ref 5–15)
Anion gap: 11 (ref 5–15)
BILIRUBIN TOTAL: 0.3 mg/dL (ref 0.3–1.2)
BUN: 22 mg/dL (ref 6–23)
BUN: 23 mg/dL (ref 6–23)
CALCIUM: 9.2 mg/dL (ref 8.4–10.5)
CHLORIDE: 96 meq/L (ref 96–112)
CO2: 30 meq/L (ref 19–32)
CO2: 34 mEq/L — ABNORMAL HIGH (ref 19–32)
Calcium: 8.9 mg/dL (ref 8.4–10.5)
Chloride: 97 mEq/L (ref 96–112)
Creatinine, Ser: 0.5 mg/dL (ref 0.50–1.10)
Creatinine, Ser: 0.5 mg/dL (ref 0.50–1.10)
GFR calc Af Amer: >90 mL/min (ref >90)
GFR calc Af Amer: >90 mL/min (ref >90)
GFR calc non Af Amer: >90 mL/min (ref >90)
Glucose, Bld: 71 mg/dL (ref 70–99)
Glucose, Bld: 98 mg/dL (ref 70–99)
POTASSIUM: 4 meq/L (ref 3.7–5.3)
Potassium: 4.2 mEq/L (ref 3.7–5.3)
SODIUM: 139 meq/L (ref 137–147)
Sodium: 142 mEq/L (ref 137–147)
TOTAL PROTEIN: 6.9 g/dL (ref 6.0–8.3)
Total Bilirubin: 0.4 mg/dL (ref 0.3–1.2)
Total Protein: 5.8 g/dL — ABNORMAL LOW (ref 6.0–8.3)

## 2014-06-17 LAB — URINE MICROSCOPIC-ADD ON

## 2014-06-17 LAB — URINALYSIS, ROUTINE W REFLEX MICROSCOPIC
Bilirubin Urine: NEGATIVE
Glucose, UA: NEGATIVE mg/dL
KETONES UR: NEGATIVE mg/dL
Nitrite: POSITIVE — AB
PROTEIN: NEGATIVE mg/dL
Specific Gravity, Urine: 1.012 (ref 1.005–1.030)
UROBILINOGEN UA: 1 mg/dL (ref 0.0–1.0)
pH: 7.5 (ref 5.0–8.0)

## 2014-06-17 LAB — MRSA PCR SCREENING: MRSA by PCR: POSITIVE — AB

## 2014-06-17 LAB — VANCOMYCIN, TROUGH: Vancomycin Tr: 19.3 ug/mL (ref 10.0–20.0)

## 2014-06-17 LAB — MAGNESIUM: MAGNESIUM: 1.6 mg/dL (ref 1.5–2.5)

## 2014-06-17 LAB — GLUCOSE, CAPILLARY: GLUCOSE-CAPILLARY: 90 mg/dL (ref 70–99)

## 2014-06-17 LAB — PROTIME-INR
INR: 1.25 (ref 0.00–1.49)
INR: 1.16 (ref 0.00–1.49)
Prothrombin Time: 15.7 seconds — ABNORMAL HIGH (ref 11.6–15.2)
Prothrombin Time: 14.8 seconds (ref 11.6–15.2)

## 2014-06-17 LAB — APTT: APTT: 28 s (ref 24–37)

## 2014-06-17 LAB — PHOSPHORUS: Phosphorus: 4 mg/dL (ref 2.3–4.6)

## 2014-06-17 MED ORDER — PANTOPRAZOLE SODIUM 40 MG IV SOLR
40.0000 mg | INTRAVENOUS | Status: DC
  Administered 2014-06-17 – 2014-06-29 (×13): 40 mg via INTRAVENOUS
  Filled 2014-06-17 (×15): qty 40

## 2014-06-17 MED ORDER — FENTANYL CITRATE 0.05 MG/ML IJ SOLN
50.0000 ug | INTRAMUSCULAR | Status: DC | PRN
  Administered 2014-06-17 – 2014-06-30 (×19): 50 ug via INTRAVENOUS
  Filled 2014-06-17 (×20): qty 2

## 2014-06-17 MED ORDER — MORPHINE SULFATE 2 MG/ML IJ SOLN
1.0000 mg | INTRAMUSCULAR | Status: DC | PRN
  Administered 2014-06-17: 1 mg via INTRAVENOUS
  Filled 2014-06-17: qty 1

## 2014-06-17 MED ORDER — FENTANYL CITRATE 0.05 MG/ML IJ SOLN
50.0000 ug | INTRAMUSCULAR | Status: DC | PRN
  Administered 2014-06-17 – 2014-06-18 (×2): 50 ug via INTRAVENOUS
  Filled 2014-06-17 (×3): qty 2

## 2014-06-17 MED ORDER — CETYLPYRIDINIUM CHLORIDE 0.05 % MT LIQD
7.0000 mL | Freq: Four times a day (QID) | OROMUCOSAL | Status: DC
  Administered 2014-06-17 – 2014-06-30 (×47): 7 mL via OROMUCOSAL

## 2014-06-17 MED ORDER — KCL IN DEXTROSE-NACL 20-5-0.45 MEQ/L-%-% IV SOLN
INTRAVENOUS | Status: AC
Start: 2014-06-17 — End: 2014-06-22
  Administered 2014-06-17: 1000 mL via INTRAVENOUS
  Administered 2014-06-18 – 2014-06-20 (×3): via INTRAVENOUS
  Administered 2014-06-20: 1000 mL via INTRAVENOUS
  Administered 2014-06-21: 11:00:00 via INTRAVENOUS
  Filled 2014-06-17 (×12): qty 1000

## 2014-06-17 MED ORDER — METRONIDAZOLE IN NACL 5-0.79 MG/ML-% IV SOLN
500.0000 mg | Freq: Three times a day (TID) | INTRAVENOUS | Status: DC
  Administered 2014-06-17 – 2014-06-26 (×26): 500 mg via INTRAVENOUS
  Administered 2014-06-26: 500 g via INTRAVENOUS
  Administered 2014-06-26 – 2014-06-30 (×13): 500 mg via INTRAVENOUS
  Filled 2014-06-17 (×43): qty 100

## 2014-06-17 MED ORDER — HEPARIN SODIUM (PORCINE) 5000 UNIT/ML IJ SOLN
5000.0000 [IU] | Freq: Three times a day (TID) | INTRAMUSCULAR | Status: DC
  Administered 2014-06-17 – 2014-06-21 (×11): 5000 [IU] via SUBCUTANEOUS
  Filled 2014-06-17 (×14): qty 1

## 2014-06-17 MED ORDER — CIPROFLOXACIN IN D5W 400 MG/200ML IV SOLN
400.0000 mg | Freq: Two times a day (BID) | INTRAVENOUS | Status: DC
  Administered 2014-06-17 – 2014-06-30 (×26): 400 mg via INTRAVENOUS
  Filled 2014-06-17 (×29): qty 200

## 2014-06-17 MED ORDER — LORAZEPAM 2 MG/ML IJ SOLN
1.0000 mg | INTRAMUSCULAR | Status: DC | PRN
  Administered 2014-06-19 – 2014-06-21 (×3): 2 mg via INTRAVENOUS
  Administered 2014-06-25 – 2014-06-29 (×4): 1 mg via INTRAVENOUS
  Filled 2014-06-17 (×7): qty 1

## 2014-06-17 MED ORDER — VANCOMYCIN HCL IN DEXTROSE 1-5 GM/200ML-% IV SOLN
1000.0000 mg | INTRAVENOUS | Status: DC
  Administered 2014-06-18 – 2014-06-30 (×13): 1000 mg via INTRAVENOUS
  Filled 2014-06-17 (×15): qty 200

## 2014-06-17 MED ORDER — ALBUTEROL SULFATE (2.5 MG/3ML) 0.083% IN NEBU: 2.5000 mg | INHALATION_SOLUTION | RESPIRATORY_TRACT | Status: DC | PRN

## 2014-06-17 MED ORDER — HYDROCORTISONE NA SUCCINATE PF 100 MG IJ SOLR
100.0000 mg | Freq: Three times a day (TID) | INTRAMUSCULAR | Status: DC
  Administered 2014-06-17 – 2014-06-18 (×3): 100 mg via INTRAVENOUS
  Filled 2014-06-17 (×6): qty 2

## 2014-06-17 MED ORDER — CHLORHEXIDINE GLUCONATE 0.12 % MT SOLN
15.0000 mL | Freq: Two times a day (BID) | OROMUCOSAL | Status: DC
  Administered 2014-06-17 – 2014-06-29 (×23): 15 mL via OROMUCOSAL
  Filled 2014-06-17 (×28): qty 15

## 2014-06-17 NOTE — ED Notes (Signed)
Per Pharmacy- this writer wasted 1 mg morphine with Press photographer, Herbert Deaner, RN. Pyxis still showing "undocumented waste". Pharmacy made aware.

## 2014-06-17 NOTE — ED Notes (Signed)
Per [facility], pt from [facility] d/t perforated bowel. Pt admitted there 2 wks ago from [facility]. Staff noticed oozing from around her Peg tube, CT completed. Pt has a trach, PICC in upper right arm, and foley PTA.

## 2014-06-17 NOTE — ED Notes (Signed)
Dr. Zackowski at the bedside.  

## 2014-06-17 NOTE — ED Notes (Signed)
Surgical PA at the bedside. Dr. Andrey Campanile entering room

## 2014-06-17 NOTE — Consult Note (Signed)
Katelyn Rojas 03/08/1941  [id number].   Requesting MD: Dr. Vanetta MuldersScott Zackowski Chief Complaint/Reason for Consult: Perforated viscus HPI: This is a very complex 73 year old black female who is a chronic vent dependent patient who has advanced dementia, dysphagia, failure to thrive, chronic stage IV sacral decubitus wound who had a PEG tube placed by interventional radiology about 3 months ago while [facility]. The patient was then transferred to [facility] about 3-4 weeks ago. Apparently the patient has had chronic drainage from around her PEG tube. At [facility], this was felt to potentially be stool. The surgeon may or evaluated the patient. He was concerned that her PEG tube may traverse her colon into her stomach. Because she was in the nursing facility part of [facility], he did not have access to further workup. [name] with a concern for perforated viscus; however, upon arrival this was determined to not be the case. [facility].  ROS please see history of present illness otherwise unable to obtain review of systems due to ventilator. She does nod her head yes to abdominal pain  No family history on file.  Past Medical History  Diagnosis Date  . Hypertension   . 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 Arthritis   . Anxiety   . Cachexia 12/2013  . DM type 2 (diabetes mellitus, type 2)   . 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 Anemia 12/2013  . Dementia   . Compression fracture of lumbar vertebra, non-traumatic 12/2013    L3 and L1.     Past Surgical History  Procedure Laterality Date  . Vaginal hysterectomy      ? Abdominal vs. Vaginal    Social History:  reports that she has quit smoking. She does not have any smokeless tobacco history on file. Her alcohol and drug histories are not on file.  Allergies:  Allergies   Allergen Reactions  . Namenda [Memantine Hcl] Other (See Comments)    "hallucinations"   . Codeine Other (See Comments)    Unknown. [facility]  . Penicillins Other (See Comments)    Unknown; [facility]     (Not in a hospital admission)  Blood pressure 101/52, pulse 80, temperature 99.6 F (37.6 C), temperature source Rectal, resp. rate 24, SpO2 97.00%. Physical Exam: General: Frail, ill-appearing black female who is laying in bed in NAD HEENT: head is normocephalic, atraumatic.  Sclera are noninjected.  PERRL.  Ears and nose without any masses or lesions.  Tracheostomy is in place and attached to the ventilator Heart: regular, rate, and rhythm.  Normal s1,s2. No obvious murmurs, gallops, or rubs noted.  Palpable radial and pedal pulses bilaterally Lungs: Coarse breath sounds bilaterally, no wheezes, rhonchi, or rales noted.  Respiratory effort nonlabored on ventilator Abd: soft, some distention, tender to palpation., +BS, no masses or organomegaly.  PEG tube is in place in the left upper quadrant. There is a minimal amount of feculent-appearing drainage around her PEG tube. MS: all 4 extremities are symmetrical with no cyanosis. She appears somewhat contractured in her lower extremities. Skin: warm and dry with no masses, lesions, or rashes. She has a 5 x 4 cm stage IV sacral decubitus ulcer. This is relatively clean. Psych: Alert.  She follows commands    No results found for this or any previous  visit (from the past 48 hour(s)). Dg Chest Portable 1 View  06/17/2014   CLINICAL DATA:  Sepsis.  Abdominal pain.  EXAM: PORTABLE CHEST - 1 VIEW  COMPARISON:  Chest x-ray 05/21/2014.  FINDINGS: Tracheostomy tube in position with tip terminating approximately 8.5 cm above the carina. Right upper extremity PICC with tip terminating at the superior cavoatrial junction. Improved aeration throughout the lung bases bilaterally, suggesting resolution of areas of  atelectasis and/or consolidation on the prior study. Residual bibasilar opacities may reflect residual areas of atelectasis or scarring. No definite acute consolidative airspace disease. Trace bilateral pleural effusions versus chronic pleuroparenchymal scarring. No evidence of pulmonary edema. Heart size is normal. Atherosclerosis in the thoracic aorta. Multiple old healed bilateral rib fractures are again noted.  IMPRESSION: 1. Support apparatus, as above. 2. Overall, there has significantly improved aeration throughout the lung bases bilaterally, although the continue be areas of mild scarring and/or subsegmental atelectasis. 3. Possible trace bilateral pleural effusions. 4. Atherosclerosis.   Electronically Signed   By: Trudie Reed M.D.   On: 06/17/2014 14:03   Dg Abd Portable 1v  06/17/2014   CLINICAL DATA:  Sepsis and abdominal pain.  EXAM: PORTABLE ABDOMEN - 1 VIEW  COMPARISON:  05/20/2014  FINDINGS: Stable gastrostomy tube. Generalized bowel distention is present. No obvious free intraperitoneal gas. Osteopenia.  IMPRESSION: Generalized bowel distention.  Stable gastrostomy.   Electronically Signed   By: Maryclare Bean M.D.   On: 06/17/2014 14:01       Assessment/Plan 1. Feculent drainage from around PEG tube 2. Advanced dementia 3. Chronic ventilator dependent respiratory failure 4. Protein calorie malnutrition  5. Debilitated 6 history of PEA arrest  Plan: 1. After thorough review of her medical records, diagnostic studies, discussion with [facility] surgeon at [facility], and discussion with the family, it has been determined that the patient does not have perforated viscus. There is a concern that when her PEG tube was placed that this may have gone through and through her colon into her stomach. She does have what appears to be some feculent drainage around her PEG tube. The patient will be admitted under observation status and I have contacted gastroenterology to see the patient for a  colonoscopy. Hopefully, this colonoscopy will be able to determine if the patient does have a PEG tube going through her colon. If she does not, a no further intervention necessarily needs to be done. If she does, we will have to make further plans at that time. Lengthy discussion has been had with the niece and her son as well as the patient about her CODE STATUS. The patient nods her head yes to wanting CPR if her heart were to stop. She also continues to want ventilator dependence even though her quality of life is incredibly poor. The patient's family adamantly refuses a palliative care discussion. 2. We'll begin normal saline wet to dry dressing changes twice a day to her sacral wound. 3. We will follow along for further recommendations. Dr. Loreta Ave and Dr. Elnoria Howard evaluate the patient. I have spoken to Dr. Elnoria Howard myself.  Luvada Salamone E 06/17/2014, 4:21 PM Pager: [phone]

## 2014-06-17 NOTE — ED Notes (Signed)
Notes from [facility]- BP has been fluctuating. BP lower here than at [name].

## 2014-06-17 NOTE — H&P (Signed)
PULMONARY / CRITICAL CARE MEDICINE   Name: Katelyn Rojas MRN: [id number] DOB: [birth year]    ADMISSION DATE:  06/17/2014 CONSULTATION DATE:  06/17/2014  REFERRING MD :  EDP  CHIEF COMPLAINT:  ? Perforated viscus  INITIAL PRESENTATION: 73 y.o. F, [name] resident on chronic trach, brought to ED for concerns that PEG tube had perforated her colon.  Per reports, PEG has been draining chronically, some of drainage thought to be stool.  Surgeon at [name] was concerned that PEG may be transversing colon into stomach.  Pt was brought to ED for evaluation of "perforated viscus"; however, upon arrival to [facility] ED, this was determined to not be the case.  PCCM was called for admission.  She will be admit under observation status as she is pending GI consult for colonoscopy.  STUDIES:  8/5 KUB >>> no free air, generalized bowel distention, stable gastrostomy.  SIGNIFICANT EVENTS: 8/5 admit >>> transferred from [name], concerns of perforated viscus, imaging at [facility] ED suggested this to not be the case.   HISTORY OF PRESENT ILLNESS:  Katelyn Rojas is a 73 y.o. F with multiple co-morbidities as outlined below and who resides at [name] nursing facility.  She was brought to the [facility] ED on 8/5 for concerns that her PEG tube may have perforated her viscus.  She was at [facility] 3 - 4 months ago and had PEG placed by IR while she was there.  Pt was then transferred to [name] 3 - 4 weeks ago.  Since PEG was placed, it has been draining.  Some of material draining thought to be stool.  Surgeon at [facility] concerned for bowel perf, hence, transfer to [facility]. In ED, KUB demonstrated no free air therefore low suspicion for bowel perf.  Multiple conversations between PCCM and CCS regarding plan of care.  Decision made to admit pt under observation status, GI will see pt tonight and decide if they will be able to perform colonoscopy to further assess whether PEG has in fact perforated bowel.  If so, pt would require surgery.   PAST  MEDICAL HISTORY :  Past Medical History  Diagnosis Date  . Hypertension   . 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 Arthritis   . Anxiety   . Cachexia 12/2013  . DM type 2 (diabetes mellitus, type 2)   . 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 Anemia 12/2013  . Dementia   . Compression fracture of lumbar vertebra, non-traumatic 12/2013    L3 and L1.    Past Surgical History  Procedure Laterality Date  . Vaginal hysterectomy      ? Abdominal vs. Vaginal   Prior to Admission medications   Medication Sig Start Date End Date Taking? Authorizing Provider  acetaminophen (TYLENOL) 325 MG tablet 650 mg by PEG Tube route every 6 (six) hours as needed (pain).   Yes Historical Provider, MD  albuterol (PROVENTIL HFA;VENTOLIN HFA) 108 (90 BASE) MCG/ACT inhaler Inhale 1-2 puffs into the lungs every 6 (six) hours as needed for wheezing or shortness of breath.   Yes Historical Provider, MD  aspirin 81 MG tablet 81 mg by PEG Tube route daily.   Yes Historical Provider, MD  bacitracin ointment Apply 1 application topically See admin instructions. Apply to G-tube every shift for redness   Yes Historical Provider, MD  chlorhexidine (PERIDEX) 0.12 % solution Use as directed 15 mLs in the mouth or throat 2 (two) times daily.   Yes Historical Provider, MD  ciprofloxacin (CIPRO) 400 MG/40ML SOLN injection Inject 400 mg into the vein 2 (two) times daily. For infection 06/16/14  Yes Historical Provider, MD  citalopram (CELEXA) 20 MG tablet 20 mg by PEG Tube route daily.   Yes Historical Provider, MD  clonazePAM (KLONOPIN) 0.25 MG disintegrating tablet 0.25 mg by PEG Tube route 3 (three) times daily.   Yes Historical Provider, MD  docusate (COLACE) 50 MG/5ML liquid 100 mg by PEG Tube route 2 (two) times daily as needed (constipation).   Yes Historical Provider, MD  donepezil (ARICEPT) 10 MG tablet 10 mg by PEG Tube route at bedtime. For dementia   Yes Historical Provider, MD  heparin  5000 UNIT/ML injection Inject 5,000 Units into the skin every 8 (eight) hours. For prophylaxis. Held for surgery.   Yes Historical Provider, MD  metoprolol tartrate (LOPRESSOR) 25 MG tablet 12.5 mg by PEG Tube route every 8 (eight) hours.   Yes Historical Provider, MD  metroNIDAZOLE (FLAGYL) 50 mg/ml oral suspension Inject 500 mg into the vein every 8 (eight) hours. For infection 06/17/14  Yes Historical Provider, MD  Nutritional Supplements (ISOSOURCE 1.5 CAL) LIQD Give 50 mL/hr by tube continuous.    Yes Historical Provider, MD  oxycodone (OXY-IR) 5 MG capsule 2.5 mg by PEG Tube route every 8 (eight) hours as needed for pain.   Yes Historical Provider, MD  pantoprazole (PROTONIX) 40 MG tablet 40 mg by PEG Tube route daily. For GERD   Yes Historical Provider, MD  predniSONE (DELTASONE) 10 MG tablet 10 mg by PEG Tube route daily. For respiratory failure   Yes Historical Provider, MD  vancomycin (VANCOCIN) 250 MG capsule Inject 750 mg into the vein every 12 (twelve) hours. For infection. Draw trough 30 mins prior to 4th dose 06/16/14  Yes Historical Provider, MD  zinc sulfate 220 MG capsule Give 220 mg by tube daily.    Yes Historical Provider, MD   Allergies  Allergen Reactions  . Namenda [Memantine Hcl] Other (See Comments)    "hallucinations"   . Codeine Other (See Comments)    Unknown. Listed on [facility]  . Penicillins Other (See Comments)    Unknown; listed on [facility]    FAMILY HISTORY:  No family history on file. SOCIAL HISTORY:  reports that she has quit smoking. She does not have any smokeless tobacco history on file. Her alcohol and drug histories are not on file.  REVIEW OF SYSTEMS:  Unable to obtain as pt is on chronic trach.  SUBJECTIVE:   VITAL SIGNS: Temp:  [99.6 F (37.6 C)] 99.6 F (37.6 C) (08/05 1300) Pulse Rate:  [70-94] 81 (08/05 1700) Resp:  [15-24] 16 (08/05 1700) BP: (79-134)/(43-65) 100/45 mmHg (08/05 1700) SpO2:  [96 %-100 %] 97 %  (08/05 1700) FiO2 (%):  [35 %] 35 % (08/05 1308) HEMODYNAMICS:   VENTILATOR SETTINGS: Vent Mode:  [-] PRVC FiO2 (%):  [35 %] 35 % Set Rate:  [24 bmp] 24 bmp Vt Set:  [400 mL] 400 mL PEEP:  [5 cmH20] 5 cmH20 Plateau Pressure:  [30 cmH20] 30 cmH20 INTAKE / OUTPUT:  Intake/Output Summary (Last 24 hours) at 06/17/14 1801 Last data filed at 06/17/14 1747  Gross per 24 hour  Intake      0 ml  Output   1000 ml  Net  -1000 ml    PHYSICAL EXAMINATION: General: Chronically ill appearing female, cachectic, resting in bed, in NAD. Neuro: A&O x 3, non-focal.  Attempts to converse. HEENT: Temporal  wasting, PERRL. MM dry. Cardiovascular: RRR, no M/R/G.  Lungs: Respirations even and unlabored on vent.  Scattered wheezes bilaterally. Abdomen: BS x 4, soft, NT/ND.  PEG in place with dried drainage noted around button. Musculoskeletal: Muscle wasting, no edema.  Skin: Warm, no rashes, sacral decub ulcer.   LABS:  CBC  Recent Labs Lab 06/17/14 1632  WBC 7.5  HGB 7.3*  HCT 23.8*  PLT 247   Coag's  Recent Labs Lab 06/17/14 1632  INR 1.25   BMET  Recent Labs Lab 06/17/14 1632  NA 139  K 4.2  CL 96  CO2 30  BUN 23  CREATININE 0.50  GLUCOSE 71   Electrolytes  Recent Labs Lab 06/17/14 1632  CALCIUM 8.9   Sepsis Markers No results found for this basename: LATICACIDVEN, PROCALCITON, O2SATVEN,  in the last 168 hours ABG No results found for this basename: PHART, PCO2ART, PO2ART,  in the last 168 hours Liver Enzymes  Recent Labs Lab 06/17/14 1632  AST 23  ALT 27  ALKPHOS 146*  BILITOT 0.3  ALBUMIN 2.5*   Cardiac Enzymes No results found for this basename: TROPONINI, PROBNP,  in the last 168 hours Glucose No results found for this basename: GLUCAP,  in the last 168 hours  Imaging No results found.   ASSESSMENT / PLAN:  PULMONARY Trach 04/01/14 A: Chronic respiratory failure COPD Tracheostomy Status P:   Full vent support. Wean as  able. Albuterol PRN.  CARDIOVASCULAR A:  Chronic Hypotension - per son, pt's SBP usually in 80's. P:  Stress steroids. Hold outpatient lopressor.  RENAL A:   No acute issues. P:   CMP now. BMP in AM.  GASTROINTESTINAL A:  PEG malfunctioning ? Bowel perforation Severe protein calorie malnutrition P:   NPO. Pantoprazole. GI consulted, possible colonoscopy. If no or negative colonoscopy, then can transfer back to [name].  HEMATOLOGIC A:  Chronic Anemia P:  VTE prophylaxis:  Heparin. Transfuse for Hgb < 7. Check coags. CBC in AM.  INFECTIOUS A:   Hx MRSA and enterococcus bacteremia Hx Tracheitis (cultures + for Alcaligenes bacteria) Sacra decubitus ulcer P:   Abx: Vanc, Cipro, Flagyl (from [facility]). Monitor fever curve / WBC's.  ENDOCRINE A:  DM At risk AI - on prednisone chronically. P:   SSI. Stress steroids.  NEUROLOGIC A:  Dementia Anxiety P:   RASS goal: 0. Fentanyl, Ativan PRN. Hold outpatient Celexa, Clonopin, Aricept. Monitor.   TODAY'S SUMMARY: 73 y.o. F from [name] on chronic trach, brought to [facility] for concerns that her PEG tube had perforated her bowel and was transversing into stomach.  Imaging in ED suggested otherwise.  Admitting under observation status, GI has been called and they will assess whether to pursue colonoscopy to further investigate/visualize whether PEG has in fact perforated bowel.  If true perforation, will require surgery.  If no colonoscopy or no bowel perf, will send back to [name].   Rutherford Guys, PA - [facility] [place] [facility] Pgr: [phone]  or [phone]  06/17/2014,4:52 PM  Reviewed above, examined.  73 yo female from [name] with concern for malpositioning of PEG tube >> reported to have feculent material from PEG and PEG site.  She has severe COPD, severe protein calorie malnutrition, muscular deconditioning, MRSA decubitus wounds, chronic hypoxic respiratory failure s/p  tracheostomy and vent dependent.  She has been seen by surgery, and GI is to evaluate for colonoscopy to determine if PEG is in colon.  Will admit for observation status for now >> if no immediate plan for colonoscopy, then likely can transfer back to [name] and arrange for procedure from there.  In meantime will continue Abx from [name].  She was on prednisone >> will order stress dose steroids.  Continue full vent support with prn albuterol.  Updated pt's family in detail about plan.  They are very optimistic about her recover since she has transferred to [name].  That are in favor of continuing all aggressive measures, and hopeful that the pt will eventually be able to go home with home vent.  Coralyn HellingVineet Ryver Zadrozny, MD [facility] 06/17/2014, 6:05 PM Pager:  [phone] After 3pm call: [phone]

## 2014-06-17 NOTE — ED Notes (Signed)
LR restarted back at 75 ml/hr. Bolus completed at this time.

## 2014-06-17 NOTE — ED Notes (Signed)
Family at bedside. 

## 2014-06-17 NOTE — Consult Note (Signed)
I saw the patient, participated in the history, exam and medical decision making, and concur with the physician assistant's note above.  [name]; indwelling trach Soft, grimaces some; perc g tube in LUQ, some drainage around site -?feculent. No cellulitis Large stage iv sacral decub (exposed bone) no significant necrotic tissue, dark eschar over bone.   Imaging reviewed. No evidence of free air or perforated viscous History of feculent drainage around G tube is suspicious for transcolonic G tube insertion. On prior CTs, transverse colon is very close to stomach. However g tube is in stomach but may have possibly gone thru colon.  However, no active sign of infection. Explained to family this isn't a surgical emergency and doesn't require immediate surgery.   After discussion with IR, felt best diagnostic approach would be colonoscopic. Dr Nicholes Mango to evaluate.   IF g tube is transcolonic, not sure if pt is operative candidate. Certainly would not be an emergency situation - g tube has been in that position since original insertion several months ago. [name] has a Careers adviser and pt can be transferred back to [facility] to discuss options with their Software engineer.   Katelyn Rojas. Andrey Campanile, MD, FACS General, Bariatric, & Minimally Invasive Surgery [facility], [place]

## 2014-06-17 NOTE — ED Notes (Signed)
Tresa Endo, PA with surgery at the bedside.

## 2014-06-17 NOTE — ED Notes (Signed)
Attempted report 

## 2014-06-17 NOTE — ED Provider Notes (Signed)
CSN: [id number]     Arrival date & time 06/17/14  1254 History   First MD Initiated Contact with Patient 06/17/14 1307     Chief Complaint  Patient presents with  . perforated bowel      (Consider location/radiation/quality/duration/timing/severity/associated sxs/prior Treatment) The history is provided by the patient and a relative.   patient from [facility]. Patient with G-tube believed to be a perforating large intestine. CT scan done several days ago raised concerns for perforated bowel and free air. General surgery aware the patient was coming in. They will be down to see the patient. Patient has a long-standing trach and PICC line in the right arm. And Foley catheter placement. Patient blood pressure according to son is normally in the 80s. That is baseline for her. Patient with complaint of abdominal pain and headache.  Past Medical History  Diagnosis Date  . Hypertension   . 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 Arthritis   . Anxiety   . Cachexia 12/2013  . DM type 2 (diabetes mellitus, type 2)   . 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 Anemia 12/2013  . Dementia   . Compression fracture of lumbar vertebra, non-traumatic 12/2013    L3 and L1.    Past Surgical History  Procedure Laterality Date  . Vaginal hysterectomy      ? Abdominal vs. Vaginal   No family history on file. History  Substance Use Topics  . Smoking status: Former Games developer  . Smokeless tobacco: Not on file  . Alcohol Use: Not on file   OB History   Grav Para Term Preterm Abortions TAB SAB Ect Mult Living                 Review of Systems  Constitutional: Negative for fever.  HENT: Positive for congestion.   Eyes: Negative for redness.  Respiratory: Negative for shortness of breath.   Cardiovascular: Negative for chest pain.  Gastrointestinal: Positive for abdominal pain and abdominal distention. Negative for nausea and vomiting.  Genitourinary: Negative for  hematuria.  Musculoskeletal: Negative for back pain.  Skin: Negative for rash.  Neurological: Positive for headaches.  Hematological: Does not bruise/bleed easily.  Psychiatric/Behavioral: Negative for confusion.      Allergies  Codeine; Namenda; and Penicillins  Home Medications   Prior to Admission medications   Medication Sig Start Date End Date Taking? Authorizing Provider  acetaminophen (TYLENOL) 325 MG tablet 650 mg by PEG Tube route every 6 (six) hours as needed (pain).   Yes Historical Provider, MD  aspirin 81 MG tablet 81 mg by PEG Tube route daily.   Yes Historical Provider, MD  bacitracin ointment Apply 1 application topically See admin instructions. Apply to G-tube every shift for redness   Yes Historical Provider, MD  ciprofloxacin (CIPRO) 400 MG/40ML SOLN injection Inject 400 mg into the vein 2 (two) times daily. For infection 06/16/14  Yes Historical Provider, MD  citalopram (CELEXA) 20 MG tablet 20 mg by PEG Tube route daily.   Yes Historical Provider, MD  clonazePAM (KLONOPIN) 0.25 MG disintegrating tablet 0.25 mg by PEG Tube route 3 (three) times daily.   Yes Historical Provider, MD  docusate (COLACE) 50 MG/5ML liquid 100 mg by PEG Tube route 2 (two) times daily as needed (constipation).   Yes Historical Provider, MD  donepezil (ARICEPT) 10 MG tablet 10 mg by PEG Tube route at bedtime. For dementia   Yes Historical Provider, MD  heparin  5000 UNIT/ML injection Inject 5,000 Units into the skin every 8 (eight) hours. For prophylaxis. Held for surgery.   Yes Historical Provider, MD  metoprolol tartrate (LOPRESSOR) 25 MG tablet 12.5 mg by PEG Tube route every 8 (eight) hours.   Yes Historical Provider, MD  metroNIDAZOLE (FLAGYL) 50 mg/ml oral suspension Inject 500 mg into the vein every 8 (eight) hours. For infection 06/17/14  Yes Historical Provider, MD  oxycodone (OXY-IR) 5 MG capsule 2.5 mg by PEG Tube route every 8 (eight) hours as needed for pain.   Yes Historical Provider,  MD  pantoprazole (PROTONIX) 40 MG tablet 40 mg by PEG Tube route daily. For GERD   Yes Historical Provider, MD  predniSONE (DELTASONE) 10 MG tablet 10 mg by PEG Tube route daily. For respiratory failure   Yes Historical Provider, MD  vancomycin (VANCOCIN) 250 MG capsule Inject 750 mg into the vein every 12 (twelve) hours. For infection. Draw trough 30 mins prior to 4th dose 06/16/14  Yes Historical Provider, MD  albuterol (PROVENTIL HFA;VENTOLIN HFA) 108 (90 BASE) MCG/ACT inhaler Inhale 1-2 puffs into the lungs every 6 (six) hours as needed for wheezing or shortness of breath.    Historical Provider, MD   BP 95/49  Pulse 88  Temp(Src) 99.6 F (37.6 C) (Rectal)  Resp 24  SpO2 98% Physical Exam  Nursing note and vitals reviewed. Constitutional: She appears well-developed and well-nourished. No distress.  HENT:  Head: Normocephalic and atraumatic.  Mouth/Throat: Oropharynx is clear and moist.  Eyes: Conjunctivae and EOM are normal. Pupils are equal, round, and reactive to light.  Neck: Normal range of motion.  Pulmonary/Chest: Effort normal and breath sounds normal. No respiratory distress.  Bilateral rhonchi  Abdominal: Soft. Bowel sounds are normal. There is no tenderness.    ED Course  Procedures (including critical care time) Labs Review Labs Reviewed - No data to display  Imaging Review Dg Chest Portable 1 View  06/17/2014   CLINICAL DATA:  Sepsis.  Abdominal pain.  EXAM: PORTABLE CHEST - 1 VIEW  COMPARISON:  Chest x-ray 05/21/2014.  FINDINGS: Tracheostomy tube in position with tip terminating approximately 8.5 cm above the carina. Right upper extremity PICC with tip terminating at the superior cavoatrial junction. Improved aeration throughout the lung bases bilaterally, suggesting resolution of areas of atelectasis and/or consolidation on the prior study. Residual bibasilar opacities may reflect residual areas of atelectasis or scarring. No definite acute consolidative airspace  disease. Trace bilateral pleural effusions versus chronic pleuroparenchymal scarring. No evidence of pulmonary edema. Heart size is normal. Atherosclerosis in the thoracic aorta. Multiple old healed bilateral rib fractures are again noted.  IMPRESSION: 1. Support apparatus, as above. 2. Overall, there has significantly improved aeration throughout the lung bases bilaterally, although the continue be areas of mild scarring and/or subsegmental atelectasis. 3. Possible trace bilateral pleural effusions. 4. Atherosclerosis.   Electronically Signed   By: Trudie Reed M.D.   On: 06/17/2014 14:03   Dg Abd Portable 1v  06/17/2014   CLINICAL DATA:  Sepsis and abdominal pain.  EXAM: PORTABLE ABDOMEN - 1 VIEW  COMPARISON:  05/20/2014  FINDINGS: Stable gastrostomy tube. Generalized bowel distention is present. No obvious free intraperitoneal gas. Osteopenia.  IMPRESSION: Generalized bowel distention.  Stable gastrostomy.   Electronically Signed   By: Maryclare Bean M.D.   On: 06/17/2014 14:01     EKG Interpretation None      MDM   Final diagnoses:  Gastrostomy complication   The patient Roger [facility]ter by [employer]  from [facility]. The concern was for perforated bowel. Plain films here showed no significant amount of intra-abdominal air. Patient being seen by his surgery. Asian does have a bruising around the G-tube site. Patient has a long-standing trach. General surgery deciding whether to admit now or plan outpatient repair of the G-tube. Is concerned that the G-tube somehow went through: And that her stool leakage. [name] blood pressure in the 80 systolic that is not far from her baseline. She is not significantly tachycardic. Do not believe that patient is clinically hypotensive. Disposition will be based on general surgery.  General surgery is planning admitting the patient. The plan is to prep for colonoscopy to see if the tube is in the colon.  Vanetta MuldersScott Cristy Colmenares, MD 06/17/14 [phone]

## 2014-06-17 NOTE — Consult Note (Addendum)
Unassigned patient  Reason for Consult: Feculent discharge around the PEG site. Referring Physician: Roderic Scarce  Ertha Cristea is an 73 y.o. female.  HPI: 73 year old female, with multiple medical issues listed below, brought to the hospital for ?perforated viscus but after she was evaluated by the surgery service a colonoscopy was requested. She is chronically ill, vent dependent patient. [name] family is adamant about not making her DNR. A colonoscopy was requested to see where the PEG tube is located.    Past Medical History  Diagnosis Date  . Hypertension   . 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 Arthritis   . Anxiety   . Cachexia 12/2013  . DM type 2 (diabetes mellitus, type 2)   . 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 Anemia 12/2013  . Dementia   . Compression fracture of lumbar vertebra, non-traumatic 12/2013    L3 and L1.    Past Surgical History  Procedure Laterality Date  . Vaginal hysterectomy      ? Abdominal vs. Vaginal   No family history on file.  Social History:  reports that she has quit smoking. She does not have any smokeless tobacco history on file. Her alcohol and drug histories are not on file.  Allergies:  Allergies  Allergen Reactions  . Namenda [Memantine Hcl] Other (See Comments)    "hallucinations"   . Codeine Other (See Comments)    Unknown. Listed on [facility]  . Penicillins Other (See Comments)    Unknown; listed on [facility]   Medications: I have reviewed the patient's current medications.  Results for orders placed during the hospital encounter of 06/17/14 (from the past 48 hour(s))  CBC     Status: Abnormal   Collection Time    06/17/14  4:32 PM      Result Value Ref Range   WBC 7.5  4.0 - 10.5 K/uL   RBC 2.31 (*) 3.87 - 5.11 MIL/uL   Hemoglobin 7.3 (*) 12.0 - 15.0 g/dL   HCT 56.3 (*) 89.3 - 73.4 %   MCV 103.0 (*) 78.0 - 100.0 fL   MCH 31.6  26.0 - 34.0 pg    MCHC 30.7  30.0 - 36.0 g/dL   RDW 28.7  68.1 - 15.7 %   Platelets 247  150 - 400 K/uL   Dg Chest Portable 1 View  06/17/2014   CLINICAL DATA:  Sepsis.  Abdominal pain.  EXAM: PORTABLE CHEST - 1 VIEW  COMPARISON:  Chest x-ray 05/21/2014.  FINDINGS: Tracheostomy tube in position with tip terminating approximately 8.5 cm above the carina. Right upper extremity PICC with tip terminating at the superior cavoatrial junction. Improved aeration throughout the lung bases bilaterally, suggesting resolution of areas of atelectasis and/or consolidation on the prior study. Residual bibasilar opacities may reflect residual areas of atelectasis or scarring. No definite acute consolidative airspace disease. Trace bilateral pleural effusions versus chronic pleuroparenchymal scarring. No evidence of pulmonary edema. Heart size is normal. Atherosclerosis in the thoracic aorta. Multiple old healed bilateral rib fractures are again noted.  IMPRESSION: 1. Support apparatus, as above. 2. Overall, there has significantly improved aeration throughout the lung bases bilaterally, although the continue be areas of mild scarring and/or subsegmental atelectasis. 3. Possible trace bilateral pleural effusions. 4. Atherosclerosis.   Electronically Signed   By: Trudie Reed M.D.   On: 06/17/2014 14:03   Dg Abd Portable 1v  06/17/2014   CLINICAL DATA:  Sepsis and abdominal pain.  EXAM: PORTABLE ABDOMEN - 1 VIEW  COMPARISON:  05/20/2014  FINDINGS: Stable gastrostomy tube. Generalized bowel distention is present. No obvious free intraperitoneal gas. Osteopenia.  IMPRESSION: Generalized bowel distention.  Stable gastrostomy.   Electronically Signed   By: Maryclare BeanArt  Hoss M.D.   On: 06/17/2014 14:01   Review of Systems  Unable to perform ROS  Blood pressure 107/45, pulse 80, temperature 99.6 F (37.6 C), temperature source Rectal, resp. rate 15, SpO2 97.00%. Physical Exam  Constitutional: She appears lethargic. She appears cachectic. She has a  sickly appearance.  HENT:  Head: Normocephalic and atraumatic.  Neck:  tracheostomy in place  Cardiovascular: Normal rate, regular rhythm and normal heart sounds.   Respiratory: Breath sounds normal.  GI: Soft. She exhibits distension. Bowel sounds are increased. There is no hepatosplenomegaly. There is tenderness in the epigastric area.  Rectal tube and has tube feeds like material in it   Neurological: She appears lethargic.  Skin: Skin is warm and dry.    Assessment/Plan: 1) Feeding difficulties; Feculent drainage around the PEG site: as per my discussion with Dr. Malachy MoanHeath McCullough, in IR, this patient is NOT a candidate for a colonoscopy. I tried to reach Washington MutualKelly Osborne PA-C with the surgical service but could not reach her as it was after 5 pm. Dr. Ruthe MannanMcCollough will discuss this case with his colleague tomorrow and make further recommendations. I DO NOT feel doing a colonoscopy is going to solve her problem. I have personally discussed this with Dr. Andrey CampanileWilson as well.    2) Vent dependent-COPD-tracheostomy. 3) Severe anemia.  4) Dementia.  5) Compression fractures L1 and L3. 6) Sacral decubitus Stage IV ulcer.  Marland Kitchen.Jassmine Vandruff 06/17/2014, 5:08 PM

## 2014-06-17 NOTE — ED Notes (Signed)
MD aware of BP. VO for 250 ml bolus of LR from bag hanging. Bolus started at this time.

## 2014-06-17 NOTE — Progress Notes (Addendum)
ANTIBIOTIC CONSULT NOTE - INITIAL  Pharmacy Consult for Ciprofloxacin and Vancomycin Indication: cellulitis (sacral wound)  Allergies  Allergen Reactions  . Namenda [Memantine Hcl] Other (See Comments)    "hallucinations"   . Codeine Other (See Comments)    Unknown. [facility] on [facility]  . Penicillins Other (See Comments)    Unknown; [facility]    Patient Measurements:   Ht:  61 kg    Wt: 39.5kg (from 02/05/14) - no current wt  Vital Signs: Temp: 99.6 F (37.6 C) (08/05 1300) Temp src: Rectal (08/05 1300) BP: 107/45 mmHg (08/05 1645) Pulse Rate: 80 (08/05 1645) Intake/Output from previous day:   Intake/Output from this shift:    Labs: No results found for this basename: WBC, HGB, PLT, LABCREA, CREATININE,  in the last 72 hours The CrCl is unknown because both a height and weight (above a minimum accepted value) are required for this calculation. No results found for this basename: VANCOTROUGH, VANCOPEAK, VANCORANDOM, GENTTROUGH, GENTPEAK, GENTRANDOM, TOBRATROUGH, TOBRAPEAK, TOBRARND, AMIKACINPEAK, AMIKACINTROU, AMIKACIN,  in the last 72 hours   Microbiology: Recent Results (from the past 720 hour(s))  CULTURE, RESPIRATORY (NON-EXPECTORATED)     Status: None   Collection Time    05/18/14  8:03 PM      Result Value Ref Range Status   Specimen Description TRACHEAL ASPIRATE   Final   Special Requests NONE   Final   Gram Stain     Final   Value: MODERATE WBC PRESENT,BOTH PMN AND MONONUCLEAR     NO SQUAMOUS EPITHELIAL CELLS SEEN     ABUNDANT GRAM POSITIVE COCCI     IN PAIRS IN CLUSTERS     Performed at [facility]   Culture     Final   Value: ABUNDANT METHICILLIN RESISTANT STAPHYLOCOCCUS AUREUS     Note: RIFAMPIN AND GENTAMICIN SHOULD NOT BE USED AS SINGLE DRUGS FOR TREATMENT OF STAPH INFECTIONS. CRITICAL RESULT CALLED TO, READ BACK BY AND VERIFIED WITH: BOBBI@7 :18AM ON 05/21/14 BY DANTS     ABUNDANT ALCALIGENES XYLOSOXIDANS   Performed at [facility]   Report Status 05/22/2014 FINAL   Final   Organism ID, Bacteria METHICILLIN RESISTANT STAPHYLOCOCCUS AUREUS   Final   Organism ID, Bacteria ALCALIGENES XYLOSOXIDANS   Final    Medical History: Past Medical History  Diagnosis Date  . Hypertension   . 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 Arthritis   . Anxiety   . Cachexia 12/2013  . DM type 2 (diabetes mellitus, type 2)   . 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 Anemia 12/2013  . Dementia   . Compression fracture of lumbar vertebra, non-traumatic 12/2013    L3 and L1.     Medications:  See electronic med rec  Assessment: 73 y.o. female [facility] consult regarding PEG tube placement. Pt is a chronic vent dependent pt with advanced dementia, dysphagia, FTT, chronic stage IV sacral decubitus wound. PEG tube placed by IR [facility] mos ago [facility]. To continue broad spectrum antibiotics (Cipro, Vancomycin, and Flagyl) for ?intra-abd process and sacral decubitus. Pt started this antibiotics yesterday at [name] (so this is Day #2). Last doses received this a.m. (Flagyl 500mg  ~0800, Cipro 400mg  ~0900, Vanc 750mg  ~1000). Vancomycin dosing was 750mg  IV q12h. Tonight would be third dose of Vancomycin, as pt with low wt, decreased age, decrease muscle mass - doubt she will need q12h  dosing but will check level tonight before 3rd dose (not quite steady state). SCr 0.5, est CrCl ~40 ml/min  Goal of Therapy:  Vancomycin trough level 10-15 mcg/ml  Plan:  1. Cipro 400mg  IV q12h. Next dose tonight at 2100. 2. Will f/u vancomycin trough tonight at 2130 to assess with Vancomycin dosing. 3. F/u new wt  Christoper Fabian, PharmD, BCPS Clinical pharmacist, pager [phone] 06/17/2014,5:01 PM   Addendum:  Vancomycin level 19.3 mcg/ml - not true trough because pt not quite at steady state. Likely will accumulate further and be supratherapeutic  if left on 750mg  IV q12h.  Plan: 1) Vancomycin 1000mg  IV q24h.  Next dose tomorrow a.m. 2) Will f/u renal function, pt weight, and trough at new Css  Christoper Fabian, PharmD, BCPS Clinical pharmacist, pager [phone] 06/17/2014   10:33 PM

## 2014-06-18 ENCOUNTER — Encounter (HOSPITAL_COMMUNITY): Payer: Self-pay | Admitting: Physician Assistant

## 2014-06-18 ENCOUNTER — Observation Stay (HOSPITAL_COMMUNITY): Payer: Medicare Other

## 2014-06-18 DIAGNOSIS — Z01810 Encounter for preprocedural cardiovascular examination: Secondary | ICD-10-CM

## 2014-06-18 DIAGNOSIS — I420 Dilated cardiomyopathy: Secondary | ICD-10-CM

## 2014-06-18 DIAGNOSIS — R1310 Dysphagia, unspecified: Secondary | ICD-10-CM

## 2014-06-18 LAB — GLUCOSE, CAPILLARY
GLUCOSE-CAPILLARY: 112 mg/dL — AB (ref 70–99)
GLUCOSE-CAPILLARY: 177 mg/dL — AB (ref 70–99)
Glucose-Capillary: 117 mg/dL — ABNORMAL HIGH (ref 70–99)
Glucose-Capillary: 168 mg/dL — ABNORMAL HIGH (ref 70–99)
Glucose-Capillary: 130 mg/dL — ABNORMAL HIGH (ref 70–99)
Glucose-Capillary: 138 mg/dL — ABNORMAL HIGH (ref 70–99)

## 2014-06-18 LAB — BASIC METABOLIC PANEL
Anion gap: 8 (ref 5–15)
BUN: 18 mg/dL (ref 6–23)
CALCIUM: 8.8 mg/dL (ref 8.4–10.5)
CO2: 35 mEq/L — ABNORMAL HIGH (ref 19–32)
Chloride: 97 mEq/L (ref 96–112)
Creatinine, Ser: 0.49 mg/dL — ABNORMAL LOW (ref 0.50–1.10)
GFR calc Af Amer: >90 mL/min (ref >90)
GLUCOSE: 149 mg/dL — AB (ref 70–99)
Potassium: 3.9 mEq/L (ref 3.7–5.3)
Sodium: 140 mEq/L (ref 137–147)

## 2014-06-18 LAB — CBC
HEMATOCRIT: 26.6 % — AB (ref 36.0–46.0)
Hemoglobin: 8.1 g/dL — ABNORMAL LOW (ref 12.0–15.0)
MCH: 30.6 pg (ref 26.0–34.0)
MCHC: 30.5 g/dL (ref 30.0–36.0)
MCV: 100.4 fL — ABNORMAL HIGH (ref 78.0–100.0)
PLATELETS: 306 10*3/uL (ref 150–400)
RBC: 2.65 MIL/uL — AB (ref 3.87–5.11)
RDW: 15.3 % (ref 11.5–15.5)
WBC: 7.6 10*3/uL (ref 4.0–10.5)

## 2014-06-18 LAB — PREALBUMIN: PREALBUMIN: 19.2 mg/dL (ref 17.0–34.0)

## 2014-06-18 MED ORDER — HYDROCORTISONE NA SUCCINATE PF 100 MG IJ SOLR
50.0000 mg | Freq: Four times a day (QID) | INTRAMUSCULAR | Status: DC
  Administered 2014-06-18 – 2014-06-23 (×19): 50 mg via INTRAVENOUS
  Filled 2014-06-18 (×24): qty 1

## 2014-06-18 MED ORDER — LIDOCAINE HCL (PF) 1 % IJ SOLN
INTRAMUSCULAR | Status: AC
  Filled 2014-06-18: qty 5

## 2014-06-18 MED ORDER — OSMOLITE 1.2 CAL PO LIQD
1000.0000 mL | ORAL | Status: DC
  Administered 2014-06-18: 1000 mL
  Filled 2014-06-18 (×3): qty 1000

## 2014-06-18 NOTE — Progress Notes (Signed)
eLink Physician-Brief Progress Note Patient Name: Katelyn Rojas DOB: [birth year] MRN: [id number]  Date of Service  06/18/2014   HPI/Events of Note   RN notes distended abdomen; G tube clamped  eICU Interventions  G tube to gravity drain, if no improvement XRay and G-tube to suction    Intervention Category Minor Interventions: Routine modifications to care plan (e.g. PRN medications for pain, fever)  MCQUAID, DOUGLAS 06/18/2014, 12:14 AM

## 2014-06-18 NOTE — Progress Notes (Signed)
Pt's abdomen tender and distended with brown looking and smelling fecal matter noted around PEG tube site. Dr. Kendrick Fries made aware. No residual from PEG but put to gravity drainage per MD request. Will continue to monitor.

## 2014-06-18 NOTE — Progress Notes (Signed)
INITIAL NUTRITION ASSESSMENT  DOCUMENTATION CODES Per approved criteria  -Severe malnutrition in the context of chronic illness -Underweight   INTERVENTION:  When able to resume TF, recommend Osmolite 1.2 with goal rate of 55 ml/h to provide 1584 kcals, 73 gm protein, 1082 ml free water daily.  Consider SLP evaluation to determine if patient able to take PO's at this time, patient was cleared for pureed foods and nectar thick liquids per FEES at [name] in July.  If unable to utilize GI tract for nutrition, may need to consider TPN, depending on overall goals of care.  NUTRITION DIAGNOSIS: Inadequate oral intake related to inability to eat as evidenced by NPO status with PEG for TF.   Goal: Intake to meet >90% of estimated nutrition needs.  Monitor:  TF initiation/tolerance/adequacy, weight trend, labs, vent status.  Reason for Assessment: VDRF; hx PEG  73 y.o. female  Admitting Dx: Suspected PEG malfunction  ASSESSMENT: 73 y.o. F, [name] resident on chronic trach/vent, brought to ED for concerns that PEG tube had perforated her colon. Per reports, PEG has been draining chronically, some of drainage thought to be stool. Surgeon at [name] was concerned that PEG may be transversing colon into stomach. Pt was brought to ED for evaluation of "perforated viscus"; however, upon [facility] to [facility], this was determined to not be the case.   8/5 KUB showed no free air, generalized bowel distention, stable gastrostomy.   Per review of PTA, patient received bolus TF via PEG with Isosource 1.5 300 ml every 6 hours with free water flushes 30 ml/h to provide 1800 kcals, 82 gm protein, 1637 ml free water daily. Also took some PO's, pureed foods and nectar thick liquids.  Patient may be transferred back to [name] soon.  Nutrition Focused Physical Exam:  Subcutaneous Fat:  Orbital Region: severe depletion Upper Arm Region: severe depletion Thoracic and Lumbar Region: NA  Muscle:   Temple Region: severe depletion Clavicle Bone Region: severe depletion Clavicle and Acromion Bone Region: severe depletion Scapular Bone Region: NA Dorsal Hand: severe depletion Patellar Region: severe depletion Anterior Thigh Region: severe depletion Posterior Calf Region: severe depletion  Edema: none  Pt meets criteria for severe MALNUTRITION in the context of chronic illness as evidenced by severe depletion of muscle and subcutaneous fat mass.  Patient is currently intubated on ventilator support MV: 7.2 L/min Temp (24hrs), Avg:98.6 F (37 C), Min:97.8 F (36.6 C), Max:99.6 F (37.6 C)   Height: Ht Readings from Last 1 Encounters:  01/31/14 5' 1.02" (1.55 m)    Weight: Wt Readings from Last 1 Encounters:  06/18/14 87 lb 15.4 oz (39.9 kg)    Ideal Body Weight: 47.7 kg  % Ideal Body Weight: 84%  Wt Readings from Last 10 Encounters:  06/18/14 87 lb 15.4 oz (39.9 kg)  02/05/14 87 lb (39.463 kg)  01/13/14 86 lb 14.4 oz (39.418 kg)  12/27/13 86 lb 13.8 oz (39.4 kg)    Usual Body Weight: 87 lb  % Usual Body Weight: 100%  BMI:  Body mass index is 16.61 kg/(m^2). Underweight.  Estimated Nutritional Needs: Kcal: 1400-1600 Protein: 70-80 gm Fluid: 1.4-1.6 L  Skin: wound VAC to sacral wound  Diet Order: NPO  EDUCATION NEEDS: -No education needs identified at this time   Intake/Output Summary (Last 24 hours) at 06/18/14 1034 Last data filed at 06/18/14 1000  Gross per 24 hour  Intake 1658.33 ml  Output   1800 ml  Net -141.67 ml    Last BM: PTA  Labs:   Recent Labs Lab 06/17/14 1632 06/17/14 1655 06/18/14 0630  NA 139 142 140  K 4.2 4.0 3.9  CL 96 97 97  CO2 30 34* 35*  BUN 23 22 18   CREATININE 0.50 0.50 0.49*  CALCIUM 8.9 9.2 8.8  MG 1.6  --   --   PHOS 4.0  --   --   GLUCOSE 71 98 149*    CBG (last 3)   Recent Labs  06/18/14 0008 06/18/14 0343 06/18/14 0811  GLUCAP 112* 117* 168*    Scheduled Meds: . antiseptic oral  rinse  7 mL Mouth Rinse QID  . chlorhexidine  15 mL Mouth Rinse BID  . ciprofloxacin  400 mg Intravenous Q12H  . heparin subcutaneous  5,000 Units Subcutaneous 3 times per day  . hydrocortisone sod succinate (SOLU-CORTEF) inj  100 mg Intravenous Q8H  . metronidazole  500 mg Intravenous Q8H  . pantoprazole (PROTONIX) IV  40 mg Intravenous Q24H  . vancomycin  1,000 mg Intravenous Q24H    Continuous Infusions: . dextrose 5 % and 0.45 % NaCl with KCl 20 mEq/L 50 mL/hr at 06/17/14 2000    Past Medical History  Diagnosis Date  . Hypertension   . 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 Arthritis   . Anxiety   . Cachexia 12/2013  . DM type 2 (diabetes mellitus, type 2)   . 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 Anemia 12/2013  . Dementia   . Compression fracture of lumbar vertebra, non-traumatic 12/2013    L3 and L1.     Past Surgical History  Procedure Laterality Date  . Vaginal hysterectomy      ? Abdominal vs. Vaginal    Joaquin CourtsKimberly Harris, RD, LDN, CNSC Pager [phone] After Hours Pager [phone]

## 2014-06-18 NOTE — Consult Note (Signed)
CARDIOLOGY CONSULT NOTE   Patient ID: Katelyn Rojas MRN: [id number], DOB/AGE: Oct 08, 1941   Admit date: 06/17/2014 Date of Consult: 06/18/2014   Primary Physician: Kirt Boys, MD Primary Cardiologist: New   Pt. Profile  73 year old [place] American female with past medical history of end-stage COPD, hypertension, cachexia, type 2 diabetes, chronic anemia, dementia and hypertension who was dependent on ventilator via trach tube had a PEG tube placed in 3 month ago presented with bowel perforation due to mal-positioned PEG tube  Problem List  Past Medical History  Diagnosis Date  . Hypertension   . 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 Arthritis   . Anxiety   . Cachexia 12/2013  . DM type 2 (diabetes mellitus, type 2)   . 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 Anemia 12/2013  . Dementia   . Compression fracture of lumbar vertebra, non-traumatic 12/2013    L3 and L1.     Past Surgical History  Procedure Laterality Date  . Vaginal hysterectomy      ? Abdominal vs. Vaginal     Allergies  Allergies  Allergen Reactions  . Namenda [Memantine Hcl] Other (See Comments)    "hallucinations"   . Codeine Other (See Comments)    Unknown. Listed on [facility]  . Penicillins Other (See Comments)    Unknown; listed on [facility]    HPI   The patient is an unfortunate 73 year old African American female with past medical history of end-stage COPD, hypertension, cachexia, type 2 diabetes, chronic anemia, dementia and hypertension. She has no known past cardiac history. According to the patient's niece, patient was largely independent and functional prior to her May admission. According to her, she does her own cooking. Her last echocardiogram on 02/04/2014 showed EF 60-65% no regional wall motion abnormality, grade 1 diastolic dysfunction. She has multiple history of previous intubation secondary to acute on chronic respiratory  failure. She was admitted in May for acute on chronic respiratory failure and was treated at [facility]. She had a PEA arrest on 04/05/2014 after left pneumothorax as a result of an enteric feeding tube was placed in the airway. During that admission, patient had PEG tube placed by interventional radiology. She has also been placed on trach tube and has largely been dependent on ventilator since that time. She was transferred to [facility] about 3-4 weeks ago.   Since her transfer, patient has been noted to have feculent material sipping through her PEG tube sites. She has largely been bedbound since May and has developed a grade 4 sacral decubitus ulcer. Per family member, they did not note any recent fever or chill, however patient has been diaphoretic a lot recently. Since her stay at [facility], patient has been getting better. Family states, when she was first transferred to [name], she was on 55% oxygen, eventually was weaned down to 35% oxygen. Her weight increased since her stay at [facility]. She used to weigh less than 60 pounds, however her nutrition status has improved somewhat in the past several month. Staff surgeon at [facility] was concerned that patient has feculent material seeping through the PEG tube, and was worried that her PEG tube may have perforated the colon. She was transferred to [facility] ED on 06/17/2014 for further evaluation. X-ray of the abdomen on arrival showed generalized bowel distention with stable gastrotomy. Chest x-ray was also obtained which showed significantly improved aeration although she does have  some area of some mild scarring versus subsegmental atelectasis and trace bilateral pleural effusion. Surgery was consulted who does not believe patient needs emergent surgery at this time as there is no definitive sign of colon perforation with the PEG tube. GI was also consulted for potential colonoscopy however deemed the patient not  a candidate for colonoscopy at this time. Interventional radiology was consulted for further woke up. A barium enema was obtained on 06/18/2014 which showed gastrotomy catheter transverses the distal transverse colon. Surgery was notified. Cardiology was consulted for preoperative clearance for this patient.   Inpatient Medications  . antiseptic oral rinse  7 mL Mouth Rinse QID  . chlorhexidine  15 mL Mouth Rinse BID  . ciprofloxacin  400 mg Intravenous Q12H  . heparin subcutaneous  5,000 Units Subcutaneous 3 times per day  . hydrocortisone sod succinate (SOLU-CORTEF) inj  50 mg Intravenous 4 times per day  . lidocaine (PF)      . metronidazole  500 mg Intravenous Q8H  . pantoprazole (PROTONIX) IV  40 mg Intravenous Q24H  . vancomycin  1,000 mg Intravenous Q24H    Family History Family History  Problem Relation Age of Onset  . Bleeding Disorder Mother     had cardiomegaly, and died to cerebral hemorrhage around age 59  . Heart attack Neg Hx     No family history of heart problem     Social History History   Social History  . Marital Status: Legally Separated    Spouse Name: N/A    Number of Children: N/A  . Years of Education: N/A   Occupational History  . Not on file.   Social History Main Topics  . Smoking status: Former Games developer  . Smokeless tobacco: Not on file  . Alcohol Use: Not on file  . Drug Use: Not on file  . Sexual Activity: Not on file   Other Topics Concern  . Not on file   Social History Narrative   Patient lives with her niece, [name].   The patient will report that she lives with her older sister, but this is a sign of her      Review of Systems  Full ROS unable to obtain due to clinical condition (trached on ventilator) [name] niece present, no prior cardiac problem, h/o dyspnea due to end-stage COPD, no recent fever or chill, lots of diaphoresis. No frank bleeding problem, h/o anemia though All other systems reviewed and are otherwise  negative except as noted above.  Physical Exam  Blood pressure 108/52, pulse 88, temperature 98.6 F (37 C), temperature source Oral, resp. rate 17, weight 87 lb 15.4 oz (39.9 kg), SpO2 96.00%.  General: cachextic, frail, on ventilator via trach Neuro: Alert and follow command. Move upper extremity with command.  HEENT: Normal  Neck: collar in place Lungs:  Resp regular and unlabored, bilateral inspiratory rhonchi, on vent via trach Heart: RRR no s3, s4, or murmurs. Abdomen: Soft,  non-distended, BS + x 4. Mildly distended, PEG tube noted, no flow or drainage Extremities: No clubbing, cyanosis or edema.   Labs  No results found for this basename: CKTOTAL, CKMB, TROPONINI,  in the last 72 hours Lab Results  Component Value Date   WBC 7.6 06/18/2014   HGB 8.1* 06/18/2014   HCT 26.6* 06/18/2014   MCV 100.4* 06/18/2014   PLT 306 06/18/2014     Recent Labs Lab 06/17/14 1655 06/18/14 0630  NA 142 140  K 4.0 3.9  CL 97 97  CO2 34* 35*  BUN 22 18  CREATININE 0.50 0.49*  CALCIUM 9.2 8.8  PROT 6.9  --   BILITOT 0.4  --   ALKPHOS 172*  --   ALT 30  --   AST 25  --   GLUCOSE 98 149*   No results found for this basename: CHOL,  HDL,  LDLCALC,  TRIG   No results found for this basename: DDIMER    Radiology/Studies  Ir Fluoro Guide Cv Line Right  06/18/2014   CLINICAL DATA:  Coloenteric fistula. Inadvertent removal of previously placed PICC catheter during patient transfer.  EXAM: PICC PLACEMENT WITH ULTRASOUND AND FLUOROSCOPY  FLUOROSCOPY TIME:  6 seconds  TECHNIQUE: After written informed consent was obtained, patient was placed in the supine position on angiographic table. Patency of the right basilic vein was confirmed with ultrasound with image documentation. An appropriate skin site was determined. Skin site was marked. Region was prepped using maximum barrier technique including cap and mask, sterile gown, sterile gloves, large sterile sheet, and Chlorhexidine as cutaneous  antisepsis. The region was infiltrated locally with 1% lidocaine. Under real-time ultrasound guidance, the right basilic vein was accessed with a 21 gauge micropuncture needle; the needle tip within the vein was confirmed with ultrasound image documentation. Needle exchanged over a 018 guidewire for a peel-away sheath, through which a 5-French double-lumen power injectable PICC trimmed to 35cm was advanced, positioned with its tip near the cavoatrial junction. Spot chest radiograph confirms appropriate catheter position. Catheter was flushed per protocol and secured externally with 0-Prolene sutures. The patient tolerated procedure well, with no immediate complication.  IMPRESSION: Technically successful five [place] double lumen power injectable PICC placement   Electronically Signed   By: Oley Balm M.D.   On: 06/18/2014 15:48   Ir Cm Inj Any Colonic Tube W/fluoro  05/22/2014   INDICATION: Leaking gastrostomy tube  EXAM: FLUOROSCOPIC GUIDED GASTROSTOMY TUBE CHECK  COMPARISON:  Fluoroscopic guided gastrostomy tube placement - 04/16/2014; CT abdomen pelvis - 05/08/2014  MEDICATIONS: None.  CONTRAST:  10mL OMNIPAQUE IOHEXOL 300 MG/ML SOLN administered into the gastric lumen  FLUOROSCOPY TIME:  12 seconds.  COMPLICATIONS: None immediate  PROCEDURE: Existing pull-through gastrostomy tube was injected with a small amount of contrast as multiple spot fluoroscopic images were obtained in various obliquities. The external bumper of the gastrostomy tube was cinched slightly tighter and a dressing was placed. The procedure was terminated. The patient tolerated the procedure well without immediate postprocedural complication.  FINDINGS: The existing pull-through gastrostomy tube is appropriately positioned with disc within the gastric lumen. No evidence of leak.  Note was made that the retention bumper was slightly retracted from the exit site of the gastrostomy tube and as such the disc was further cinched about the  anterior aspect of the abdominal wall.  IMPRESSION: 1. Appropriately positioned and functioning pull-through gastrostomy tube. No exchange performed. 2. The external retention bumper was cinched slightly tighter to the anterior aspect of the abdominal wall. Additionally, the patient's providing nurse was educated not to place a dressing under the bumper, but rather over top of the gastrostomy tube exit site.   Electronically Signed   By: Simonne Come M.D.   On: 05/22/2014 16:26   Ir Fluoro Procedure Unlisted  06/18/2014   CLINICAL DATA:  Feculent drainage around previously placed gastrostomy catheter. Previous CT suggested possible trans colonic placement.  EXAM: SINGLE COLUMN BARIUM ENEMA  TECHNIQUE: Initial scout AP supine abdominal image obtained to insure adequate colon cleansing. Barium was  introduced into the colon in a retrograde fashion . Spot images of the colon followed by overhead radiographs were obtained.  FLUOROSCOPY TIME:  6 seconds  COMPARISON:  CT 05/08/2014 and earlier studies  FINDINGS: The gastrostomy catheter traverses the distal transverse segment of colon. During the procedure, a small amount of barium was noted to exit adjacent to the gastrostomy tube at its skin entry site. No evidence of obstruction. Descending and sigmoid segments are unremarkable. Rectum distends normally. Ascending colon incompletely evaluated.  IMPRESSION: 1. Gastrostomy catheter traverses the distal transverse colon. I telephoned the critical test results to OSBORNE,KELLY E PA at the time of interpretation.   Electronically Signed   By: Oley Balm M.D.   On: 06/18/2014 15:47   Ir US Guide Vasc Access Right  06/18/2014   CLINICAL DATA:  Coloenteric fistula. Inadvertent removal of previously placed PICC catheter during patient transfer.  EXAM: PICC PLACEMENT WITH ULTRASOUND AND FLUOROSCOPY  FLUOROSCOPY TIME:  6 seconds  TECHNIQUE: After written informed consent was obtained, patient was placed in the supine  position on angiographic table. Patency of the right basilic vein was confirmed with ultrasound with image documentation. An appropriate skin site was determined. Skin site was marked. Region was prepped using maximum barrier technique including cap and mask, sterile gown, sterile gloves, large sterile sheet, and Chlorhexidine as cutaneous antisepsis. The region was infiltrated locally with 1% lidocaine. Under real-time ultrasound guidance, the right basilic vein was accessed with a 21 gauge micropuncture needle; the needle tip within the vein was confirmed with ultrasound image documentation. Needle exchanged over a 018 guidewire for a peel-away sheath, through which a 5-French double-lumen power injectable PICC trimmed to 35cm was advanced, positioned with its tip near the cavoatrial junction. Spot chest radiograph confirms appropriate catheter position. Catheter was flushed per protocol and secured externally with 0-Prolene sutures. The patient tolerated procedure well, with no immediate complication.  IMPRESSION: Technically successful five [place] double lumen power injectable PICC placement   Electronically Signed   By: Oley Balm M.D.   On: 06/18/2014 15:48   Dg Chest Portable 1 View  06/17/2014   CLINICAL DATA:  Sepsis.  Abdominal pain.  EXAM: PORTABLE CHEST - 1 VIEW  COMPARISON:  Chest x-ray 05/21/2014.  FINDINGS: Tracheostomy tube in position with tip terminating approximately 8.5 cm above the carina. Right upper extremity PICC with tip terminating at the superior cavoatrial junction. Improved aeration throughout the lung bases bilaterally, suggesting resolution of areas of atelectasis and/or consolidation on the prior study. Residual bibasilar opacities may reflect residual areas of atelectasis or scarring. No definite acute consolidative airspace disease. Trace bilateral pleural effusions versus chronic pleuroparenchymal scarring. No evidence of pulmonary edema. Heart size is normal.  Atherosclerosis in the thoracic aorta. Multiple old healed bilateral rib fractures are again noted.  IMPRESSION: 1. Support apparatus, as above. 2. Overall, there has significantly improved aeration throughout the lung bases bilaterally, although the continue be areas of mild scarring and/or subsegmental atelectasis. 3. Possible trace bilateral pleural effusions. 4. Atherosclerosis.   Electronically Signed   By: Trudie Reed M.D.   On: 06/17/2014 14:03   Dg Chest Port 1 View  05/21/2014   CLINICAL DATA:  PICC placement  EXAM: PORTABLE CHEST - 1 VIEW  COMPARISON:  05/17/2014  FINDINGS: Right PICC has its tip projecting just above the caval atrial junction, well positioned.  Areas of lung scarring are stable. No acute findings in the lungs. Cardiac silhouette is normal in size. Tracheostomy tube is stable  in well positioned.  IMPRESSION: Right PICC tip projects at the caval atrial junction, well positioned. No other change from the prior study.   Electronically Signed   By: Amie Portland M.D.   On: 05/21/2014 20:22   Dg Chest Port 1 View  05/21/2014   CLINICAL DATA:  Respiratory failure  EXAM: PORTABLE CHEST - 1 VIEW  COMPARISON:  May 17, 2014  FINDINGS: Patchy atelectatic change in the lung bases is stable. Pleural plaques are again noted. There is no new opacity. There is no frank edema or consolidation. There is underlying emphysema. The heart size and pulmonary vascularity within normal limits. Tracheostomy tip is 5.5 cm above the carina. Bones are osteoporotic.  IMPRESSION: Patchy bibasilar atelectatic change. Underlying emphysematous change. Pleural plaques are again noted. No new opacity is appreciable.   Electronically Signed   By: Bretta Bang M.D.   On: 05/21/2014 08:13   Dg Abd Portable 1v  06/17/2014   CLINICAL DATA:  Sepsis and abdominal pain.  EXAM: PORTABLE ABDOMEN - 1 VIEW  COMPARISON:  05/20/2014  FINDINGS: Stable gastrostomy tube. Generalized bowel distention is present. No obvious  free intraperitoneal gas. Osteopenia.  IMPRESSION: Generalized bowel distention.  Stable gastrostomy.   Electronically Signed   By: Maryclare Bean M.D.   On: 06/17/2014 14:01   Dg Abd Portable 1v  05/20/2014   CLINICAL DATA:  Evaluate percutaneous enter gastric tube placement.  EXAM: PORTABLE ABDOMEN - 1 VIEW  COMPARISON:  05/17/2014.  FINDINGS: Retention balloon from percutaneous gastrostomy tube projects over the left upper quadrant of the abdomen. The injected contrast material is seen within the stomach in the proximal small bowel. Gas and stool are noted throughout the colon extending to the level of the distal rectum. No pathologic dilatation of small bowel is noted.  IMPRESSION: 1. Tip of percutaneous gastrostomy tube is in the stomach.   Electronically Signed   By: Trudie Reed M.D.   On: 05/20/2014 19:01    ECG  Obtain new EKG  ASSESSMENT AND PLAN  1. preop clearance  - will obtain EKG and echo prior to surgery  - Echo 02/04/2014 EF 60-65% no regional wall motion abnormality, grade 1 diastolic dysfunction.  - high risk patient due to severe COPD, anemia and malnutrition, no significant cardiac history, EF normal on echo in Mar 2015 prior to cardiac arrest  2. Perforated distal transverse colon due to malpositioned PEG tube since 3 month ago  - suprisingly pt has no peritonitis symptom  - further surgical workup per surgery  3. end-stage COPD, vent dependent 4. Hypertension 5. Cachexia 6. type 2 diabetes 7. chronic anemia 8. hypertension   Signed, Azalee Course, PA-C 06/18/2014, 5:34 PM   History and all data above reviewed.  Patient examined.  I agree with the findings as above. The patient reports pain in her chest and elsewhere but this is nonspecific and it is difficult to further define this.  I do not get a sense of any acute chest symptoms.  Her case is complicated as above.  She has never had a dx of CAD and, in fact, during her complicated hospital course earlier this year,  she had negative troponins.  However, she did have a reduced EF.  She was not seen by cardiology at that time.  She is now being considered for surgical treatment of a perforated colon.   The patient exam reveals COR:RRR  ,  Lungs: Decreased breath sounds  ,  Abd: Decreased bowel sounds, Ext  Cachectic and no edema (Very difficult exam with her contractions)  .  All available labs, radiology testing, previous records reviewed. Agree with documented assessment and plan. We are asked to comment on her surgical risk.  She has no indication of CAD although she has never been evaluated for this.  This is nothing that suggests ongoing ischemia.  We will reassess her EF.  At this point I would not see any absolute cardiac contraindications to surgery.  However, her surgical risk is high related to her overall status including her malnutrition and advanced lung disease.  I discussed this with the patient and her niece.    Rollene RotundaJames Tana Trefry  6:26 PM  06/18/2014

## 2014-06-18 NOTE — Progress Notes (Signed)
Patient ID: Katelyn Rojas, female   DOB: 15-Jul-1941, 73 y.o.   MRN: [id number]     [facility]      [address] [place], [place] [address]    Phone: [phone] FAX: [phone]     Subjective: Somewhat difficult to communicate, but no specific complaints.    Objective:  Vital signs:  Filed Vitals:   06/18/14 0500 06/18/14 0600 06/18/14 0700 06/18/14 0811  BP: 112/62 121/61 103/59   Pulse: 81 84 75   Temp:    97.8 F (36.6 C)  TempSrc:    Oral  Resp: _0 SpO2: 95% 97% 98%     Last BM Date:  (pta)  Intake/Output   Yesterday:  08/05 0701 - 08/06 0700 In: 1138.3 [I.V.:548.3; IV Piggyback:500] Out: 1800 [Urine:1800] This shift:    I/O last 3 completed shifts: In: 1138.3 [I.V.:548.3; Other:90; IV Piggyback:500] Out: 1800 [Urine:1800]    Physical Exam: General: Pt awake and in no acute distress. ity Abdomen: Soft.  Nondistended. Mild tenderness to suprapubic region with a palpable mass to LLQ.  PEG tube with fecal matter around it, I cleansed this, there is mild irritation, but no cellulitis, a dressing was placed. .  No evidence of peritonitis.  No incarcerated hernias.   Problem List:   Active Problems:   Abdominal pain    Results:   Labs: Results for orders placed during the hospital encounter of 06/17/14 (from the past 48 hour(s))  PREALBUMIN     Status: None   Collection Time    06/17/14  4:32 PM      Result Value Ref Range   Prealbumin 19.2  17.0 - 34.0 mg/dL   Comment: Performed at [facility]  CBC     Status: Abnormal   Collection Time    06/17/14  4:32 PM      Result Value Ref Range   WBC 7.5  4.0 - 10.5 K/uL   RBC 2.31 (*) 3.87 - 5.11 MIL/uL   Hemoglobin 7.3 (*) 12.0 - 15.0 g/dL   HCT 23.8 (*) 36.0 - 46.0 %   MCV 103.0 (*) 78.0 - 100.0 fL   MCH 31.6  26.0 - 34.0 pg   MCHC 30.7  30.0 - 36.0 g/dL   RDW 15.3  11.5 - 15.5 %   Platelets 247  150 - 400 K/uL  COMPREHENSIVE  METABOLIC PANEL     Status: Abnormal   Collection Time    06/17/14  4:32 PM      Result Value Ref Range   Sodium 139  137 - 147 mEq/L   Potassium 4.2  3.7 - 5.3 mEq/L   Chloride 96  96 - 112 mEq/L   CO2 30  19 - 32 mEq/L   Glucose, Bld 71  70 - 99 mg/dL   BUN 23  6 - 23 mg/dL   Creatinine, Ser 0.50  0.50 - 1.10 mg/dL   Calcium 8.9  8.4 - 10.5 mg/dL   Total Protein 5.8 (*) 6.0 - 8.3 g/dL   Albumin 2.5 (*) 3.5 - 5.2 g/dL   AST 23  0 - 37 U/L   ALT 27  0 - 35 U/L   Alkaline Phosphatase 146 (*) 39 - 117 U/L   Total Bilirubin 0.3  0.3 - 1.2 mg/dL   GFR calc non Af Amer >90  >90 mL/min   GFR calc Af Amer >90  >90 mL/min   Comment: (NOTE)  The eGFR has been calculated using the CKD EPI equation.     This calculation has not been validated in all clinical situations.     eGFR's persistently <90 mL/min signify possible Chronic Kidney     Disease.   Anion gap 13  5 - 15  PROTIME-INR     Status: Abnormal   Collection Time    06/17/14  4:32 PM      Result Value Ref Range   Prothrombin Time 15.7 (*) 11.6 - 15.2 seconds   INR 1.25  0.00 - 1.49  MAGNESIUM     Status: None   Collection Time    06/17/14  4:32 PM      Result Value Ref Range   Magnesium 1.6  1.5 - 2.5 mg/dL  PHOSPHORUS     Status: None   Collection Time    06/17/14  4:32 PM      Result Value Ref Range   Phosphorus 4.0  2.3 - 4.6 mg/dL  CBC     Status: Abnormal   Collection Time    06/17/14  4:55 PM      Result Value Ref Range   WBC 9.9  4.0 - 10.5 K/uL   RBC 2.70 (*) 3.87 - 5.11 MIL/uL   Hemoglobin 8.4 (*) 12.0 - 15.0 g/dL   HCT 27.9 (*) 36.0 - 46.0 %   MCV 103.3 (*) 78.0 - 100.0 fL   MCH 31.1  26.0 - 34.0 pg   MCHC 30.1  30.0 - 36.0 g/dL   RDW 15.4  11.5 - 15.5 %   Platelets 290  150 - 400 K/uL  COMPREHENSIVE METABOLIC PANEL     Status: Abnormal   Collection Time    06/17/14  4:55 PM      Result Value Ref Range   Sodium 142  137 - 147 mEq/L   Potassium 4.0  3.7 - 5.3 mEq/L   Chloride 97  96 - 112 mEq/L    CO2 34 (*) 19 - 32 mEq/L   Glucose, Bld 98  70 - 99 mg/dL   BUN 22  6 - 23 mg/dL   Creatinine, Ser 0.50  0.50 - 1.10 mg/dL   Calcium 9.2  8.4 - 10.5 mg/dL   Total Protein 6.9  6.0 - 8.3 g/dL   Albumin 3.1 (*) 3.5 - 5.2 g/dL   AST 25  0 - 37 U/L   ALT 30  0 - 35 U/L   Alkaline Phosphatase 172 (*) 39 - 117 U/L   Total Bilirubin 0.4  0.3 - 1.2 mg/dL   GFR calc non Af Amer >90  >90 mL/min   GFR calc Af Amer >90  >90 mL/min   Comment: (NOTE)     The eGFR has been calculated using the CKD EPI equation.     This calculation has not been validated in all clinical situations.     eGFR's persistently <90 mL/min signify possible Chronic Kidney     Disease.   Anion gap 11  5 - 15  PROTIME-INR     Status: None   Collection Time    06/17/14  4:55 PM      Result Value Ref Range   Prothrombin Time 14.8  11.6 - 15.2 seconds   INR 1.16  0.00 - 1.49  APTT     Status: None   Collection Time    06/17/14  4:55 PM      Result Value Ref Range   aPTT 28  24 -  37 seconds  GLUCOSE, CAPILLARY     Status: None   Collection Time    06/17/14  6:20 PM      Result Value Ref Range   Glucose-Capillary 90  70 - 99 mg/dL  MRSA PCR SCREENING     Status: Abnormal   Collection Time    06/17/14  6:28 PM      Result Value Ref Range   MRSA by PCR POSITIVE (*) NEGATIVE   Comment:            The GeneXpert MRSA Assay (FDA     approved for NASAL specimens     only), is one component of a     comprehensive MRSA colonization     surveillance program. It is not     intended to diagnose MRSA     infection nor to guide or     monitor treatment for     MRSA infections.     RESULT CALLED TO, READ BACK BY AND VERIFIED WITH:     A BECK RN 2031 06/17/14 A BROWNING  URINALYSIS, ROUTINE W REFLEX MICROSCOPIC     Status: Abnormal   Collection Time    06/17/14  6:28 PM      Result Value Ref Range   Color, Urine YELLOW  YELLOW   APPearance TURBID (*) CLEAR   Specific Gravity, Urine 1.012  1.005 - 1.030   pH 7.5  5.0 -  8.0   Glucose, UA NEGATIVE  NEGATIVE mg/dL   Hgb urine dipstick MODERATE (*) NEGATIVE   Bilirubin Urine NEGATIVE  NEGATIVE   Ketones, ur NEGATIVE  NEGATIVE mg/dL   Protein, ur NEGATIVE  NEGATIVE mg/dL   Urobilinogen, UA 1.0  0.0 - 1.0 mg/dL   Nitrite POSITIVE (*) NEGATIVE   Leukocytes, UA LARGE (*) NEGATIVE  URINE MICROSCOPIC-ADD ON     Status: Abnormal   Collection Time    06/17/14  6:28 PM      Result Value Ref Range   Squamous Epithelial / LPF FEW (*) RARE   WBC, UA 7-10  <3 WBC/hpf   RBC / HPF 0-2  <3 RBC/hpf   Bacteria, UA MANY (*) RARE   Crystals CA OXALATE CRYSTALS (*) NEGATIVE   Urine-Other MANY YEAST    VANCOMYCIN, TROUGH     Status: None   Collection Time    06/17/14  9:25 PM      Result Value Ref Range   Vancomycin Tr 19.3  10.0 - 20.0 ug/mL  GLUCOSE, CAPILLARY     Status: Abnormal   Collection Time    06/18/14 12:08 AM      Result Value Ref Range   Glucose-Capillary 112 (*) 70 - 99 mg/dL   Comment 1 Documented in Chart     Comment 2 Notify RN    GLUCOSE, CAPILLARY     Status: Abnormal   Collection Time    06/18/14  3:43 AM      Result Value Ref Range   Glucose-Capillary 117 (*) 70 - 99 mg/dL   Comment 1 Documented in Chart     Comment 2 Notify RN    CBC     Status: Abnormal   Collection Time    06/18/14  6:18 AM      Result Value Ref Range   WBC 7.6  4.0 - 10.5 K/uL   RBC 2.65 (*) 3.87 - 5.11 MIL/uL   Hemoglobin 8.1 (*) 12.0 - 15.0 g/dL   HCT 26.6 (*) 36.0 - 46.0 %  MCV 100.4 (*) 78.0 - 100.0 fL   MCH 30.6  26.0 - 34.0 pg   MCHC 30.5  30.0 - 36.0 g/dL   RDW 15.3  11.5 - 15.5 %   Platelets 306  150 - 400 K/uL  GLUCOSE, CAPILLARY     Status: Abnormal   Collection Time    06/18/14  8:11 AM      Result Value Ref Range   Glucose-Capillary 168 (*) 70 - 99 mg/dL    Imaging / Studies: Dg Chest Portable 1 View  06/17/2014   CLINICAL DATA:  Sepsis.  Abdominal pain.  EXAM: PORTABLE CHEST - 1 VIEW  COMPARISON:  Chest x-ray 05/21/2014.  FINDINGS:  Tracheostomy tube in position with tip terminating approximately 8.5 cm above the carina. Right upper extremity PICC with tip terminating at the superior cavoatrial junction. Improved aeration throughout the lung bases bilaterally, suggesting resolution of areas of atelectasis and/or consolidation on the prior study. Residual bibasilar opacities may reflect residual areas of atelectasis or scarring. No definite acute consolidative airspace disease. Trace bilateral pleural effusions versus chronic pleuroparenchymal scarring. No evidence of pulmonary edema. Heart size is normal. Atherosclerosis in the thoracic aorta. Multiple old healed bilateral rib fractures are again noted.  IMPRESSION: 1. Support apparatus, as above. 2. Overall, there has significantly improved aeration throughout the lung bases bilaterally, although the continue be areas of mild scarring and/or subsegmental atelectasis. 3. Possible trace bilateral pleural effusions. 4. Atherosclerosis.   Electronically Signed   By: Vinnie Langton M.D.   On: 06/17/2014 14:03   Dg Abd Portable 1v  06/17/2014   CLINICAL DATA:  Sepsis and abdominal pain.  EXAM: PORTABLE ABDOMEN - 1 VIEW  COMPARISON:  05/20/2014  FINDINGS: Stable gastrostomy tube. Generalized bowel distention is present. No obvious free intraperitoneal gas. Osteopenia.  IMPRESSION: Generalized bowel distention.  Stable gastrostomy.   Electronically Signed   By: Maryclare Bean M.D.   On: 06/17/2014 14:01    Medications / Allergies:  Scheduled Meds: . antiseptic oral rinse  7 mL Mouth Rinse QID  . chlorhexidine  15 mL Mouth Rinse BID  . ciprofloxacin  400 mg Intravenous Q12H  . heparin subcutaneous  5,000 Units Subcutaneous 3 times per day  . hydrocortisone sod succinate (SOLU-CORTEF) inj  100 mg Intravenous Q8H  . metronidazole  500 mg Intravenous Q8H  . pantoprazole (PROTONIX) IV  40 mg Intravenous Q24H  . vancomycin  1,000 mg Intravenous Q24H   Continuous Infusions: . dextrose 5 % and  0.45 % NaCl with KCl 20 mEq/L 50 mL/hr at 06/17/14 2000   PRN Meds:.albuterol, fentaNYL, fentaNYL, LORazepam  Antibiotics: Anti-infectives   Start     Dose/Rate Route Frequency Ordered Stop   06/18/14 0600  vancomycin (VANCOCIN) IVPB 1000 mg/200 mL premix     1,000 mg 200 mL/hr over 60 Minutes Intravenous Every 24 hours 06/17/14 2234     06/17/14 2100  ciprofloxacin (CIPRO) IVPB 400 mg     400 mg 200 mL/hr over 60 Minutes Intravenous Every 12 hours 06/17/14 1816     06/17/14 1700  metroNIDAZOLE (FLAGYL) IVPB 500 mg     500 mg 100 mL/hr over 60 Minutes Intravenous Every 8 hours 06/17/14 1658          Assessment 1. Feculent drainage from around PEG tube  2. Advanced dementia  3. Chronic ventilator dependent respiratory failure  4. Protein calorie malnutrition  5. Debilitated  6 history of PEA arrest  Plan GI did not feel like she  was a candidate for a colonoscopy.  We will discuss with IR for further plans.  She does not exhibit peritoneal signs, no surrounding cellulitis around the PEG tube, no leukocytosis and therefore no indications for urgent surgical intervention.  If the G tube is determined to be transcolonic, given that this has been ongoing since the feeding tube was placed.  The patient may be sent back to [name] to follow up with surgeon on staff or perhaps a tertiary care facility.  Further recommendations to follow after IR review.  Erby Pian, [facility] Pager [phone] Office [phone]  06/18/2014 8:54 AM

## 2014-06-18 NOTE — Procedures (Signed)
R arm PowerPICC placed under US and fluoroscopy No ptx on spot chest radiograph. No complication No blood loss. See complete dictation in [facility] PACS.  

## 2014-06-18 NOTE — Procedures (Signed)
BE shows Gastrostomy  tube traverses distal transverse segment. Some contrast noted to exit at skin entry site along G tube. No obstruction.

## 2014-06-18 NOTE — Progress Notes (Signed)
Central Line accidentally removed while transferring patient from bed to operating table in IR. MD made aware and will be replaced in IR

## 2014-06-18 NOTE — Progress Notes (Signed)
Had prolonged discussion with niece about mgmt options regarding transcolonic G tube.  Gave niece printed out NSQIP risk calculator for colectomy in pt with her comorbidities - risk of death >30%; any complication >60%; risk of major complication 45%  Discussed  -->no intervention - pt has had this issue for several months and no evidence of sepsis to my knowledge, downside would be potential skin wound issues -->Surgical repair - would require colon resection, end colostomy and potential gastric wedge resection or primary repair with relocation of g tube. Explained I would NOT perform anastomosis - she would get an ostomy if I was doing surgery - bedridden, stage iv sacral decub, chronic prednisone, deconditioned - feel risk of leak would be to high. Discussed risks of colectomy/ostomy extensively. Explained that before I would consider operating i would rec cardiology evaluation. To my knowledge, has had no cardiac evaluation since PEA arrest in 5/15. Explained that if her that if  Function too low /cardiac risk too high - i would not offer surgery - -> transfer back to [facility] with repair there - niece refuses for surgery to be done at [facility] --> niece asks what about surgery delay - tx back to [facility], let pt get stronger, and surgery on down the road - explained that I can't guarantee [name] would accept pt with a transcolonic g tube.   We agreed to restart TF this evening so [facility] MDs can get a sense of amount of drainage from around g tube & cards consult to comment on cardiac clearance.   Re-addressed code status - as long as she is able to indicate what she wants done we are going to honor her wishes - niece. Full code.   Katelyn Rojas. Andrey Campanile, MD, FACS General, Bariatric, & Minimally Invasive Surgery [facility], [place]

## 2014-06-18 NOTE — Clinical Social Work Psychosocial (Signed)
Clinical Social Work Department BRIEF PSYCHOSOCIAL ASSESSMENT 06/18/2014  Patient:  Katelyn Rojas, Katelyn Rojas     Account Number:  [id number]     Admit date:  06/17/2014  Clinical Social Worker:  Mosie Epstein  Date/Time:  06/18/2014 01:29 PM  Referred by:  Physician  Date Referred:  06/18/2014 Referred for  SNF Placement   Other Referral:   none.   Interview type:  Family Other interview type:   CSW spoke with pt's son, Paulla Dolly, regarding discharge disposition.    PSYCHOSOCIAL DATA Living Status:  FACILITY Admitted from facility:  Other Level of care:  Skilled Nursing Facility Primary support name:  Paulla Dolly Primary support relationship to patient:  CHILD, ADULT Degree of support available:   Strong support system. Pt's son reports he is POA and pt's guardian.    CURRENT CONCERNS Current Concerns  Post-Acute Placement   Other Concerns:   none.    SOCIAL WORK ASSESSMENT / PLAN CSW received consult regarding pt admitted from [name] skilled nursing facility. CSW contacted pt's son, Paulla Dolly, regarding discharge disposition. Pt's son confirmed pt is from [facility] and will be returning once medically stable for discharge.    CSW to continue to follow and assist with discharge planning needs.   Assessment/plan status:  Psychosocial Support/Ongoing Assessment of Needs Other assessment/ plan:   none.   Information/referral to community resources:   Pt to return to [facility].    [name]/FAMILY'S RESPONSE TO PLAN OF CARE: Pt's son understanding and agreeable to CSW plan of care. Pt's son expressed no further questions or concerns at this time.       Marcelline Deist, MSW, [facility] Licensed Clinical Social Worker [phone] and [phone]

## 2014-06-18 NOTE — Progress Notes (Signed)
UR completed 

## 2014-06-18 NOTE — Progress Notes (Signed)
PULMONARY / CRITICAL CARE MEDICINE   Name: Katelyn Rojas MRN: [id number] DOB: 02-May-1941    ADMISSION DATE:  06/17/2014 CONSULTATION DATE:  06/17/14  REFERRING MD :  [name] >> ED  CHIEF COMPLAINT: ? Perforated viscus   INITIAL PRESENTATION: 73 y.o. F, [name] resident on chronic trach, brought to ED for concerns that PEG tube had perforated her colon. Per reports, PEG has been draining chronically, some of drainage thought to be stool. Surgeon at [name] was concerned that PEG may be transversing colon into stomach. Pt was brought to ED for evaluation of "perforated viscus"; however, upon arrival to [facility] ED, this was determined to not be the case. PCCM was called for admission. She will be admit under observation status as she is pending GI consult for colonoscopy.   STUDIES:  8/5 KUB >>> no free air, generalized bowel distention, stable gastrostomy.  8/6 >> No surgery needed, IR to evaluate the G-tube.    SIGNIFICANT EVENTS:  8/5 admit >>> transferred from [name], concerns of perforated viscus, imaging at [facility] ED suggested this to not be the case.   HISTORY OF PRESENT ILLNESS:  Mrs. Paulding is a 73 y.o. F with multiple co-morbidities as outlined below and who resides at [name] nursing facility. She was brought to the [facility] ED on 8/5 for concerns that her PEG tube may have perforated her viscus. She was at [facility] 3 - 4 months ago and had PEG placed by IR while she was there. Pt was then transferred to [name] 3 - 4 weeks ago. Since PEG was placed, it has been draining. Some of material draining thought to be stool. Surgeon at [facility] concerned for bowel perf, hence, transfer to [facility].  In ED, KUB demonstrated no free air therefore low suspicion for bowel perf. Multiple conversations between PCCM and CCS regarding plan of care. Decision made to admit pt under observation status, GI will see pt tonight and decide if they will be able to perform colonoscopy to further assess whether PEG has in fact  perforated bowel. If so, pt would require surgery.   PAST MEDICAL HISTORY :  Past Medical History  Diagnosis Date  . Hypertension   . 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 Arthritis   . Anxiety   . Cachexia 12/2013  . DM type 2 (diabetes mellitus, type 2)   . 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 Anemia 12/2013  . Dementia   . Compression fracture of lumbar vertebra, non-traumatic 12/2013    L3 and L1.    Past Surgical History  Procedure Laterality Date  . Vaginal hysterectomy      ? Abdominal vs. Vaginal   Prior to Admission medications   Medication Sig Start Date End Date Taking? Authorizing Provider  acetaminophen (TYLENOL) 325 MG tablet 650 mg by PEG Tube route every 6 (six) hours as needed (pain).   Yes Historical Provider, MD  albuterol (PROVENTIL HFA;VENTOLIN HFA) 108 (90 BASE) MCG/ACT inhaler Inhale 1-2 puffs into the lungs every 6 (six) hours as needed for wheezing or shortness of breath.   Yes Historical Provider, MD  aspirin 81 MG tablet 81 mg by PEG Tube route daily.   Yes Historical Provider, MD  bacitracin ointment Apply 1 application topically See admin instructions. Apply to G-tube every shift for redness   Yes Historical Provider, MD  chlorhexidine (PERIDEX) 0.12 % solution Use as directed 15 mLs in the mouth or throat 2 (two) times daily.  Yes Historical Provider, MD  ciprofloxacin (CIPRO) 400 MG/40ML SOLN injection Inject 400 mg into the vein 2 (two) times daily. For infection 06/16/14  Yes Historical Provider, MD  citalopram (CELEXA) 20 MG tablet 20 mg by PEG Tube route daily.   Yes Historical Provider, MD  clonazePAM (KLONOPIN) 0.25 MG disintegrating tablet 0.25 mg by PEG Tube route 3 (three) times daily.   Yes Historical Provider, MD  docusate (COLACE) 50 MG/5ML liquid 100 mg by PEG Tube route 2 (two) times daily as needed (constipation).   Yes Historical Provider, MD  donepezil (ARICEPT) 10 MG tablet 10 mg by PEG Tube route at  bedtime. For dementia   Yes Historical Provider, MD  heparin 5000 UNIT/ML injection Inject 5,000 Units into the skin every 8 (eight) hours. For prophylaxis. Held for surgery.   Yes Historical Provider, MD  metoprolol tartrate (LOPRESSOR) 25 MG tablet 12.5 mg by PEG Tube route every 8 (eight) hours.   Yes Historical Provider, MD  metroNIDAZOLE (FLAGYL) 50 mg/ml oral suspension Inject 500 mg into the vein every 8 (eight) hours. For infection 06/17/14  Yes Historical Provider, MD  Nutritional Supplements (ISOSOURCE 1.5 CAL) LIQD Give 50 mL/hr by tube continuous.    Yes Historical Provider, MD  oxycodone (OXY-IR) 5 MG capsule 2.5 mg by PEG Tube route every 8 (eight) hours as needed for pain.   Yes Historical Provider, MD  pantoprazole (PROTONIX) 40 MG tablet 40 mg by PEG Tube route daily. For GERD   Yes Historical Provider, MD  predniSONE (DELTASONE) 10 MG tablet 10 mg by PEG Tube route daily. For respiratory failure   Yes Historical Provider, MD  vancomycin (VANCOCIN) 250 MG capsule Inject 750 mg into the vein every 12 (twelve) hours. For infection. Draw trough 30 mins prior to 4th dose 06/16/14  Yes Historical Provider, MD  zinc sulfate 220 MG capsule Give 220 mg by tube daily.    Yes Historical Provider, MD   Allergies  Allergen Reactions  . Namenda [Memantine Hcl] Other (See Comments)    "hallucinations"   . Codeine Other (See Comments)    [facility]. [name]  . Penicillins Other (See Comments)    Unknown[facility]    FAMILY HISTORY:  No family history on file. SOCIAL HISTORY:  reports that she has quit smoking. She does not have any smokeless tobacco history on file. Her alcohol and drug histories are not on file.  REVIEW OF SYSTEMS:  Unable to obtain, patient on chronic trach  SUBJECTIVE: per ELINK, mild distended abdomen, Gtube clamped, seen by surgery, no surgical indication, IR to comment on Gtube  VITAL SIGNS: Temp:  [97.8 F (36.6 C)-98.5  F (36.9 C)] 98 F (36.7 C) (08/06 1219) Pulse Rate:  [71-89] 74 (08/06 1219) Resp:  [12-32] 23 (08/06 1219) BP: (84-123)/(43-62) 115/54 mmHg (08/06 1219) SpO2:  [95 %-100 %] 97 % (08/06 1407) FiO2 (%):  [35 %] 35 % (08/06 1407) Weight:  [87 lb 15.4 oz (39.9 kg)] 87 lb 15.4 oz (39.9 kg) (08/06 0700) HEMODYNAMICS:   VENTILATOR SETTINGS: Vent Mode:  [-] PSV;CPAP FiO2 (%):  [35 %] 35 % Set Rate:  [14 bmp] 14 bmp Vt Set:  [400 mL] 400 mL PEEP:  [5 cmH20] 5 cmH20 Pressure Support:  [12 cmH20] 12 cmH20 Plateau Pressure:  [16 cmH20-28 cmH20] 16 cmH20 INTAKE / OUTPUT:  Intake/Output Summary (Last 24 hours) at 06/18/14 1443 Last data filed at 06/18/14 1100  Gross per 24 hour  Intake 1658.33 ml  Output   2100 ml  Net -441.67 ml    PHYSICAL EXAMINATION: General: Chronically ill appearing female, cachectic, resting in bed, in NAD.  Neuro: A&O x 3, non-focal. Attempts to converse.  HEENT: Temporal wasting, PERRL. MM dry.  Cardiovascular: RRR, no M/R/G.  Lungs: Respirations even and unlabored on vent. Scattered wheezes bilaterally.  Abdomen: BS x 4, soft, NT/ND. PEG in place with dried drainage noted around button.  Musculoskeletal: Muscle wasting, no edema.  Skin: Warm, no rashes, sacral decub ulcer.   LABS:  CBC  Recent Labs Lab 06/17/14 1632 06/17/14 1655 06/18/14 0618  WBC 7.5 9.9 7.6  HGB 7.3* 8.4* 8.1*  HCT 23.8* 27.9* 26.6*  PLT 247 290 306   Coag's  Recent Labs Lab 06/17/14 1632 06/17/14 1655  APTT  --  28  INR 1.25 1.16   BMET  Recent Labs Lab 06/17/14 1632 06/17/14 1655 06/18/14 0630  NA 139 142 140  K 4.2 4.0 3.9  CL 96 97 97  CO2 30 34* 35*  BUN 23 22 18   CREATININE 0.50 0.50 0.49*  GLUCOSE 71 98 149*   Electrolytes  Recent Labs Lab 06/17/14 1632 06/17/14 1655 06/18/14 0630  CALCIUM 8.9 9.2 8.8  MG 1.6  --   --   PHOS 4.0  --   --    Sepsis Markers No results found for this basename: LATICACIDVEN, PROCALCITON, O2SATVEN,  in the  last 168 hours ABG No results found for this basename: PHART, PCO2ART, PO2ART,  in the last 168 hours Liver Enzymes  Recent Labs Lab 06/17/14 1632 06/17/14 1655  AST 23 25  ALT 27 30  ALKPHOS 146* 172*  BILITOT 0.3 0.4  ALBUMIN 2.5* 3.1*   Cardiac Enzymes No results found for this basename: TROPONINI, PROBNP,  in the last 168 hours Glucose  Recent Labs Lab 06/17/14 1820 06/18/14 0008 06/18/14 0343 06/18/14 0811 06/18/14 1135  GLUCAP 90 112* 117* 168* 177*    Imaging Dg Chest Portable 1 View  06/17/2014   CLINICAL DATA:  Sepsis.  Abdominal pain.  EXAM: PORTABLE CHEST - 1 VIEW  COMPARISON:  Chest x-ray 05/21/2014.  FINDINGS: Tracheostomy tube in position with tip terminating approximately 8.5 cm above the carina. Right upper extremity PICC with tip terminating at the superior cavoatrial junction. Improved aeration throughout the lung bases bilaterally, suggesting resolution of areas of atelectasis and/or consolidation on the prior study. Residual bibasilar opacities may reflect residual areas of atelectasis or scarring. No definite acute consolidative airspace disease. Trace bilateral pleural effusions versus chronic pleuroparenchymal scarring. No evidence of pulmonary edema. Heart size is normal. Atherosclerosis in the thoracic aorta. Multiple old healed bilateral rib fractures are again noted.  IMPRESSION: 1. Support apparatus, as above. 2. Overall, there has significantly improved aeration throughout the lung bases bilaterally, although the continue be areas of mild scarring and/or subsegmental atelectasis. 3. Possible trace bilateral pleural effusions. 4. Atherosclerosis.   Electronically Signed   By: Trudie Reed M.D.   On: 06/17/2014 14:03   Dg Abd Portable 1v  06/17/2014   CLINICAL DATA:  Sepsis and abdominal pain.  EXAM: PORTABLE ABDOMEN - 1 VIEW  COMPARISON:  05/20/2014  FINDINGS: Stable gastrostomy tube. Generalized bowel distention is present. No obvious free  intraperitoneal gas. Osteopenia.  IMPRESSION: Generalized bowel distention.  Stable gastrostomy.   Electronically Signed   By: Maryclare Bean M.D.   On: 06/17/2014 14:01     ASSESSMENT / PLAN:  PULMONARY  Trach 04/01/14  A:  Chronic respiratory failure  COPD  Tracheostomy Status  P:  Full vent support.  Wean as able.  Albuterol PRN.   CARDIOVASCULAR  A:  Chronic Hypotension - per son, pt's SBP usually in 80's.  P:  Stress steroids. 50 mg q6hrs Hold outpatient lopressor.   RENAL  A:  No acute issues.  P:  BMP in AM.   GASTROINTESTINAL  A:  PEG malfunctioning  ? Bowel perforation  Severe protein calorie malnutrition   P:  NPO.  Pantoprazole.  GI consulted- not a candidate for colonoscopy Seen by surgery - no surgical intervention warranted at this time, further evaluation pending by IR>  If no intervention by IR, then can transfer back to [name].   HEMATOLOGIC  A:  Chronic Anemia  P:  VTE prophylaxis: Heparin.  Transfuse for Hgb < 7.  Check coags.  CBC in AM.   INFECTIOUS  A:  Hx MRSA and enterococcus bacteremia Hx Tracheitis (cultures + for Alcaligenes bacteria)  Sacra decubitus ulcer  P:  Abx: Vanc, Cipro, Flagyl (from [facility]).  Monitor fever curve / WBC's.   ENDOCRINE  A:  DM  At risk AI - on prednisone chronically.  P:  SSI.  Stress steroids.   NEUROLOGIC  A:  Dementia  Anxiety  P:  RASS goal: 0.  Fentanyl, Ativan PRN.  Hold outpatient Celexa, Clonopin, Aricept.  Monitor.     TODAY'S SUMMARY:73 y.o. F from [name] on chronic trach, brought to [facility] for concerns that her PEG tube had perforated her bowel and was transversing into stomach. Imaging in ED suggested otherwise. Admitting under observation status, GI stated not a candidate for colonoscopy, and there is no surgical intervention warranted; currently awaiting recs from IR. If no further IR intervention then will send back to [name].    I have personally obtained a history,  examined the patient, evaluated laboratory and imaging results, formulated the assessment and plan and placed orders. CRITICAL CARE: The patient is critically ill with multiple organ systems failure and requires high complexity decision making for assessment and support, frequent evaluation and titration of therapies, application of advanced monitoring technologies and extensive interpretation of multiple databases. Critical Care Time devoted to patient care services described in this note is 30 minutes.   Stephanie Acre, MD [place] [facility] Pager [phone] On Call Pager [phone]   06/18/2014, 2:43 PM

## 2014-06-18 NOTE — Progress Notes (Signed)
I have seen and examined the patient and agree with the assessment and plans. IR to do BE to evaluate relationship of g-tube and colon.  Dezire Turk A. Magnus Ivan  MD, FACS

## 2014-06-19 DIAGNOSIS — I10 Essential (primary) hypertension: Secondary | ICD-10-CM | POA: Diagnosis present

## 2014-06-19 DIAGNOSIS — R5381 Other malaise: Secondary | ICD-10-CM | POA: Diagnosis present

## 2014-06-19 DIAGNOSIS — K9423 Gastrostomy malfunction: Secondary | ICD-10-CM | POA: Diagnosis present

## 2014-06-19 DIAGNOSIS — IMO0002 Reserved for concepts with insufficient information to code with codable children: Secondary | ICD-10-CM | POA: Diagnosis not present

## 2014-06-19 DIAGNOSIS — R69 Illness, unspecified: Secondary | ICD-10-CM | POA: Diagnosis present

## 2014-06-19 DIAGNOSIS — Z930 Tracheostomy status: Secondary | ICD-10-CM | POA: Diagnosis not present

## 2014-06-19 DIAGNOSIS — I9589 Other hypotension: Secondary | ICD-10-CM | POA: Diagnosis present

## 2014-06-19 DIAGNOSIS — R64 Cachexia: Secondary | ICD-10-CM | POA: Diagnosis present

## 2014-06-19 DIAGNOSIS — J449 Chronic obstructive pulmonary disease, unspecified: Secondary | ICD-10-CM | POA: Diagnosis present

## 2014-06-19 DIAGNOSIS — Z7982 Long term (current) use of aspirin: Secondary | ICD-10-CM | POA: Diagnosis not present

## 2014-06-19 DIAGNOSIS — E119 Type 2 diabetes mellitus without complications: Secondary | ICD-10-CM | POA: Diagnosis present

## 2014-06-19 DIAGNOSIS — F0390 Unspecified dementia without behavioral disturbance: Secondary | ICD-10-CM | POA: Diagnosis present

## 2014-06-19 DIAGNOSIS — Y833 Surgical operation with formation of external stoma as the cause of abnormal reaction of the patient, or of later complication, without mention of misadventure at the time of the procedure: Secondary | ICD-10-CM | POA: Diagnosis present

## 2014-06-19 DIAGNOSIS — Z79899 Other long term (current) drug therapy: Secondary | ICD-10-CM | POA: Diagnosis not present

## 2014-06-19 DIAGNOSIS — E43 Unspecified severe protein-calorie malnutrition: Secondary | ICD-10-CM | POA: Diagnosis present

## 2014-06-19 DIAGNOSIS — F411 Generalized anxiety disorder: Secondary | ICD-10-CM | POA: Diagnosis present

## 2014-06-19 DIAGNOSIS — L89109 Pressure ulcer of unspecified part of back, unspecified stage: Secondary | ICD-10-CM | POA: Diagnosis present

## 2014-06-19 DIAGNOSIS — K631 Perforation of intestine (nontraumatic): Secondary | ICD-10-CM | POA: Diagnosis present

## 2014-06-19 DIAGNOSIS — Z681 Body mass index (BMI) 19 or less, adult: Secondary | ICD-10-CM | POA: Diagnosis not present

## 2014-06-19 DIAGNOSIS — R1310 Dysphagia, unspecified: Secondary | ICD-10-CM | POA: Diagnosis present

## 2014-06-19 DIAGNOSIS — R636 Underweight: Secondary | ICD-10-CM

## 2014-06-19 DIAGNOSIS — I428 Other cardiomyopathies: Secondary | ICD-10-CM | POA: Diagnosis present

## 2014-06-19 DIAGNOSIS — Z87891 Personal history of nicotine dependence: Secondary | ICD-10-CM | POA: Diagnosis not present

## 2014-06-19 DIAGNOSIS — Z9911 Dependence on respirator [ventilator] status: Secondary | ICD-10-CM | POA: Diagnosis not present

## 2014-06-19 DIAGNOSIS — D649 Anemia, unspecified: Secondary | ICD-10-CM | POA: Diagnosis present

## 2014-06-19 DIAGNOSIS — D72829 Elevated white blood cell count, unspecified: Secondary | ICD-10-CM | POA: Diagnosis not present

## 2014-06-19 DIAGNOSIS — R0989 Other specified symptoms and signs involving the circulatory and respiratory systems: Secondary | ICD-10-CM

## 2014-06-19 DIAGNOSIS — E46 Unspecified protein-calorie malnutrition: Secondary | ICD-10-CM

## 2014-06-19 DIAGNOSIS — Z8614 Personal history of Methicillin resistant Staphylococcus aureus infection: Secondary | ICD-10-CM | POA: Diagnosis not present

## 2014-06-19 DIAGNOSIS — R0609 Other forms of dyspnea: Secondary | ICD-10-CM

## 2014-06-19 DIAGNOSIS — J9610 Chronic respiratory failure, unspecified whether with hypoxia or hypercapnia: Secondary | ICD-10-CM | POA: Diagnosis present

## 2014-06-19 DIAGNOSIS — E876 Hypokalemia: Secondary | ICD-10-CM | POA: Diagnosis not present

## 2014-06-19 DIAGNOSIS — Z8674 Personal history of sudden cardiac arrest: Secondary | ICD-10-CM | POA: Diagnosis not present

## 2014-06-19 DIAGNOSIS — L8994 Pressure ulcer of unspecified site, stage 4: Secondary | ICD-10-CM | POA: Diagnosis present

## 2014-06-19 DIAGNOSIS — I498 Other specified cardiac arrhythmias: Secondary | ICD-10-CM | POA: Diagnosis not present

## 2014-06-19 LAB — GLUCOSE, CAPILLARY
GLUCOSE-CAPILLARY: 114 mg/dL — AB (ref 70–99)
GLUCOSE-CAPILLARY: 121 mg/dL — AB (ref 70–99)
Glucose-Capillary: 104 mg/dL — ABNORMAL HIGH (ref 70–99)
Glucose-Capillary: 111 mg/dL — ABNORMAL HIGH (ref 70–99)
Glucose-Capillary: 115 mg/dL — ABNORMAL HIGH (ref 70–99)
Glucose-Capillary: 119 mg/dL — ABNORMAL HIGH (ref 70–99)
Glucose-Capillary: 174 mg/dL — ABNORMAL HIGH (ref 70–99)

## 2014-06-19 LAB — CBC
HCT: 26.5 % — ABNORMAL LOW (ref 36.0–46.0)
Hemoglobin: 8 g/dL — ABNORMAL LOW (ref 12.0–15.0)
MCH: 30.4 pg (ref 26.0–34.0)
MCHC: 30.2 g/dL (ref 30.0–36.0)
MCV: 100.8 fL — ABNORMAL HIGH (ref 78.0–100.0)
Platelets: 341 10*3/uL (ref 150–400)
RBC: 2.63 MIL/uL — ABNORMAL LOW (ref 3.87–5.11)
RDW: 15.1 % (ref 11.5–15.5)
WBC: 10 10*3/uL (ref 4.0–10.5)

## 2014-06-19 LAB — BASIC METABOLIC PANEL
Anion gap: 8 (ref 5–15)
BUN: 13 mg/dL (ref 6–23)
CO2: 34 mEq/L — ABNORMAL HIGH (ref 19–32)
Calcium: 8.7 mg/dL (ref 8.4–10.5)
Chloride: 101 mEq/L (ref 96–112)
Creatinine, Ser: 0.47 mg/dL — ABNORMAL LOW (ref 0.50–1.10)
GFR calc Af Amer: >90 mL/min (ref >90)
GFR calc non Af Amer: >90 mL/min (ref >90)
Glucose, Bld: 119 mg/dL — ABNORMAL HIGH (ref 70–99)
Potassium: 3.8 mEq/L (ref 3.7–5.3)
Sodium: 143 mEq/L (ref 137–147)

## 2014-06-19 NOTE — Progress Notes (Signed)
ANTIBIOTIC CONSULT NOTE - FOLLOW UP  Pharmacy Consult for Vanc + Cipro Indication: Sacral decubitus cellulitis & empiric coverage for intra-abdominal process  Allergies  Allergen Reactions  . Namenda [Memantine Hcl] Other (See Comments)    "hallucinations"   . Codeine Other (See Comments)    Unknown. Listed on [facility]  . Penicillins Other (See Comments)    Unknown; listed on [facility]    Patient Measurements: Weight: 87 lb 15.4 oz (39.9 kg)  Vital Signs: Temp: 99 F (37.2 C) (08/07 1200) Temp src: Oral (08/07 1200) BP: 125/52 mmHg (08/07 1301) Pulse Rate: 85 (08/07 1301) Intake/Output from previous day: 08/06 0701 - 08/07 0700 In: 2380 [I.V.:1200; NG/GT:270; IV Piggyback:900] Out: 1160 [Urine:1160] Intake/Output from this shift: Total I/O In: 580 [I.V.:250; NG/GT:30; IV Piggyback:300] Out: -   Labs:  Recent Labs  06/17/14 1655 06/18/14 0618 06/18/14 0630 06/19/14 1100  WBC 9.9 7.6  --  10.0  HGB 8.4* 8.1*  --  8.0*  PLT 290 306  --  341  CREATININE 0.50  --  0.49* 0.47*   The CrCl is unknown because both a height and weight (above a minimum accepted value) are required for this calculation.  Recent Labs  06/17/14 2125  VANCOTROUGH 19.3     Microbiology: Recent Results (from the past 720 hour(s))  MRSA PCR SCREENING     Status: Abnormal   Collection Time    06/17/14  6:28 PM      Result Value Ref Range Status   MRSA by PCR POSITIVE (*) NEGATIVE Final   Comment:            The GeneXpert MRSA Assay (FDA     approved for NASAL specimens     only), is one component of a     comprehensive MRSA colonization     surveillance program. It is not     intended to diagnose MRSA     infection nor to guide or     monitor treatment for     MRSA infections.     RESULT CALLED TO, READ BACK BY AND VERIFIED WITH:     A BECK RN 2031 06/17/14 A BROWNING    Anti-infectives   Start     Dose/Rate Route Frequency Ordered Stop   06/18/14 0600   vancomycin (VANCOCIN) IVPB 1000 mg/200 mL premix     1,000 mg 200 mL/hr over 60 Minutes Intravenous Every 24 hours 06/17/14 2234     06/17/14 2100  ciprofloxacin (CIPRO) IVPB 400 mg     400 mg 200 mL/hr over 60 Minutes Intravenous Every 12 hours 06/17/14 1816     06/17/14 1700  metroNIDAZOLE (FLAGYL) IVPB 500 mg     500 mg 100 mL/hr over 60 Minutes Intravenous Every 8 hours 06/17/14 1658        Assessment: 73 y.o. female presents from [facility] for [facility]CS consult regarding PEG tube placement. Pt is a chronic vent dependent pt with advanced dementia, dysphagia, FTT, chronic stage IV sacral decubitus wound. PEG tube placed by IR ~3 mos ago while at [facility]. The patient was continued on broad spectrum antibiotics (Cipro, Vancomycin, and Flagyl) for ?intra-abd process and sacral decubitus that was started at [name] on 8/4.   The patient has low body weight and poor muscle mass so kinetics are hard to determine. SCr appears to be at baseline, UOP is good at 1.2 ml/kg/hr. Will continue at current doses and plan to check a Vanc  trough at steady state.   Goal of Therapy:  Vancomycin trough level 15-20 mcg/ml Proper antibiotics for infection/cultures adjusted for renal/hepatic function   Plan:  1. Continue Vancomycin 1g IV every 24 hours 2. Continue Cipro 400 mg IV every 12 hours 3. Will continue to follow renal function, culture results, LOT, and antibiotic de-escalation plans   Georgina PillionElizabeth Nabeel Gladson, PharmD, BCPS Clinical Pharmacist Pager: [phone] 06/19/2014 2:38 PM

## 2014-06-19 NOTE — Progress Notes (Signed)
TELEMETRY: Reviewed telemetry pt in [facility]: Filed Vitals:   06/19/14 0700 06/19/14 0757 06/19/14 0800 06/19/14 0845  BP: 124/56 124/56 121/54   Pulse: 95 101 93   Temp:    99 F (37.2 C)  TempSrc:    Oral  Resp: 25 16 26    Weight:      SpO2: 93% 98% 100%     Intake/Output Summary (Last 24 hours) at 06/19/14 1138 Last data filed at 06/19/14 [id number]  Gross per 24 hour  Intake   2150 ml  Output    860 ml  Net   1290 ml   Filed Weights   06/18/14 0700  Weight: 87 lb 15.4 oz (39.9 kg)    Subjective Awake alert on vent. No complaints.  Marland Kitchen. antiseptic oral rinse  7 mL Mouth Rinse QID  . chlorhexidine  15 mL Mouth Rinse BID  . ciprofloxacin  400 mg Intravenous Q12H  . heparin subcutaneous  5,000 Units Subcutaneous 3 times per day  . hydrocortisone sod succinate (SOLU-CORTEF) inj  50 mg Intravenous 4 times per day  . metronidazole  500 mg Intravenous Q8H  . pantoprazole (PROTONIX) IV  40 mg Intravenous Q24H  . vancomycin  1,000 mg Intravenous Q24H   . dextrose 5 % and 0.45 % NaCl with KCl 20 mEq/L 50 mL/hr at 06/19/14 1137    LABS: Basic Metabolic Panel:  Recent Labs  96/02/5407/05/15 1632 06/17/14 1655 06/18/14 0630  NA 139 142 140  K 4.2 4.0 3.9  CL 96 97 97  CO2 30 34* 35*  GLUCOSE 71 98 149*  BUN 23 22 18   CREATININE 0.50 0.50 0.49*  CALCIUM 8.9 9.2 8.8  MG 1.6  --   --   PHOS 4.0  --   --    Liver Function Tests:  Recent Labs  06/17/14 1632 06/17/14 1655  AST 23 25  ALT 27 30  ALKPHOS 146* 172*  BILITOT 0.3 0.4  PROT 5.8* 6.9  ALBUMIN 2.5* 3.1*   No results found for this basename: LIPASE, AMYLASE,  in the last 72 hours CBC:  Recent Labs  06/17/14 1655 06/18/14 0618  WBC 9.9 7.6  HGB 8.4* 8.1*  HCT 27.9* 26.6*  MCV 103.3* 100.4*  PLT 290 306   Cardiac Enzymes: No results found for this basename: CKTOTAL, CKMB, CKMBINDEX, TROPONINI,  in the last 72 hours BNP: No results found for this basename: PROBNP,  in the last 72 hours D-Dimer: No results  found for this basename: DDIMER,  in the last 72 hours Hemoglobin A1C: No results found for this basename: HGBA1C,  in the last 72 hours Fasting Lipid Panel: No results found for this basename: CHOL, HDL, LDLCALC, TRIG, CHOLHDL, LDLDIRECT,  in the last 72 hours Thyroid Function Tests: No results found for this basename: TSH, T4TOTAL, FREET3, T3FREE, THYROIDAB,  in the last 72 hours   Radiology/Studies:  Ir Fluoro Guide Cv Line Right  06/18/2014   CLINICAL DATA:  Coloenteric fistula. Inadvertent removal of previously placed PICC catheter during patient transfer.  EXAM: PICC PLACEMENT WITH ULTRASOUND AND FLUOROSCOPY  FLUOROSCOPY TIME:  6 seconds  TECHNIQUE: After written informed consent was obtained, patient was placed in the supine position on angiographic table. Patency of the right basilic vein was confirmed with ultrasound with image documentation. An appropriate skin site was determined. Skin site was marked. Region was prepped using maximum barrier technique including cap and mask, sterile gown, sterile gloves, large sterile sheet, and Chlorhexidine as cutaneous antisepsis.  The region was infiltrated locally with 1% lidocaine. Under real-time ultrasound guidance, the right basilic vein was accessed with a 21 gauge micropuncture needle; the needle tip within the vein was confirmed with ultrasound image documentation. Needle exchanged over a 018 guidewire for a peel-away sheath, through which a 5-French double-lumen power injectable PICC trimmed to 35cm was advanced, positioned with its tip near the cavoatrial junction. Spot chest radiograph confirms appropriate catheter position. Catheter was flushed per protocol and secured externally with 0-Prolene sutures. The patient tolerated procedure well, with no immediate complication.  IMPRESSION: Technically successful five [place] double lumen power injectable PICC placement   Electronically Signed   By: Oley Balm M.D.   On: 06/18/2014 15:48   Ir  Fluoro Procedure Unlisted  06/18/2014   CLINICAL DATA:  Feculent drainage around previously placed gastrostomy catheter. Previous CT suggested possible trans colonic placement.  EXAM: SINGLE COLUMN BARIUM ENEMA  TECHNIQUE: Initial scout AP supine abdominal image obtained to insure adequate colon cleansing. Barium was introduced into the colon in a retrograde fashion . Spot images of the colon followed by overhead radiographs were obtained.  FLUOROSCOPY TIME:  6 seconds  COMPARISON:  CT 05/08/2014 and earlier studies  FINDINGS: The gastrostomy catheter traverses the distal transverse segment of colon. During the procedure, a small amount of barium was noted to exit adjacent to the gastrostomy tube at its skin entry site. No evidence of obstruction. Descending and sigmoid segments are unremarkable. Rectum distends normally. Ascending colon incompletely evaluated.  IMPRESSION: 1. Gastrostomy catheter traverses the distal transverse colon. I telephoned the critical test results to OSBORNE,KELLY E PA at the time of interpretation.   Electronically Signed   By: Oley Balm M.D.   On: 06/18/2014 15:47   Ir US Guide Vasc Access Right  06/18/2014   CLINICAL DATA:  Coloenteric fistula. Inadvertent removal of previously placed PICC catheter during patient transfer.  EXAM: PICC PLACEMENT WITH ULTRASOUND AND FLUOROSCOPY  FLUOROSCOPY TIME:  6 seconds  TECHNIQUE: After written informed consent was obtained, patient was placed in the supine position on angiographic table. Patency of the right basilic vein was confirmed with ultrasound with image documentation. An appropriate skin site was determined. Skin site was marked. Region was prepped using maximum barrier technique including cap and mask, sterile gown, sterile gloves, large sterile sheet, and Chlorhexidine as cutaneous antisepsis. The region was infiltrated locally with 1% lidocaine. Under real-time ultrasound guidance, the right basilic vein was accessed with a 21  gauge micropuncture needle; the needle tip within the vein was confirmed with ultrasound image documentation. Needle exchanged over a 018 guidewire for a peel-away sheath, through which a 5-French double-lumen power injectable PICC trimmed to 35cm was advanced, positioned with its tip near the cavoatrial junction. Spot chest radiograph confirms appropriate catheter position. Catheter was flushed per protocol and secured externally with 0-Prolene sutures. The patient tolerated procedure well, with no immediate complication.  IMPRESSION: Technically successful five [place] double lumen power injectable PICC placement   Electronically Signed   By: Oley Balm M.D.   On: 06/18/2014 15:48   Dg Chest Portable 1 View  06/17/2014   CLINICAL DATA:  Sepsis.  Abdominal pain.  EXAM: PORTABLE CHEST - 1 VIEW  COMPARISON:  Chest x-ray 05/21/2014.  FINDINGS: Tracheostomy tube in position with tip terminating approximately 8.5 cm above the carina. Right upper extremity PICC with tip terminating at the superior cavoatrial junction. Improved aeration throughout the lung bases bilaterally, suggesting resolution of areas of atelectasis and/or consolidation on  the prior study. Residual bibasilar opacities may reflect residual areas of atelectasis or scarring. No definite acute consolidative airspace disease. Trace bilateral pleural effusions versus chronic pleuroparenchymal scarring. No evidence of pulmonary edema. Heart size is normal. Atherosclerosis in the thoracic aorta. Multiple old healed bilateral rib fractures are again noted.  IMPRESSION: 1. Support apparatus, as above. 2. Overall, there has significantly improved aeration throughout the lung bases bilaterally, although the continue be areas of mild scarring and/or subsegmental atelectasis. 3. Possible trace bilateral pleural effusions. 4. Atherosclerosis.   Electronically Signed   By: Trudie Reed M.D.   On: 06/17/2014 14:03   Dg Abd Portable 1v  06/17/2014    CLINICAL DATA:  Sepsis and abdominal pain.  EXAM: PORTABLE ABDOMEN - 1 VIEW  COMPARISON:  05/20/2014  FINDINGS: Stable gastrostomy tube. Generalized bowel distention is present. No obvious free intraperitoneal gas. Osteopenia.  IMPRESSION: Generalized bowel distention.  Stable gastrostomy.   Electronically Signed   By: Maryclare Bean M.D.   On: 06/17/2014 14:01   Ecg: Normal.  Echo:Study Conclusions  - Left ventricle: The cavity size was normal. Systolic function was normal. The estimated ejection fraction was in the range of 60% to 65%. Wall motion was normal; there were no regional wall motion abnormalities.   PHYSICAL EXAM General: cachextic, frail, on ventilator via trach  Neuro: Alert and follow command. Move upper extremity with command.  HEENT: Normal  Neck: collar in place  Lungs: Resp regular and unlabored, bilateral inspiratory rhonchi, on vent via trach  Heart: RRR no s3, s4, or murmurs.  Abdomen: Soft, non-distended, BS + x 4. Mildly distended, PEG tube noted, no flow or drainage  Extremities: No clubbing, cyanosis or edema.    ASSESSMENT AND PLAN: 1. preop clearance  - Ecg normal - Echo Normal - high risk patient due to severe COPD, anemia and malnutrition, no significant cardiac history, EF normal. No cardiac contraindication for surgery.  2. Perforated distal transverse colon due to malpositioned PEG tube since 3 month ago  - further management  per surgery   3. end-stage COPD, vent dependent  4. Hypertension  5. Cachexia  6. type 2 diabetes  7. chronic anemia  8. hypertension   We will sign off. Please call with questions/problems.   Present on Admission:  . Abdominal pain  Signed, Peter [place], MDFACC 06/19/2014 11:38 AM

## 2014-06-19 NOTE — Progress Notes (Signed)
  Echocardiogram 2D Echocardiogram has been performed.  Katelyn Rojas M 06/19/2014, 9:27 AM

## 2014-06-19 NOTE — Progress Notes (Signed)
PULMONARY / CRITICAL CARE MEDICINE   Name: Katelyn Rojas MRN: [id number] DOB: [birth year]    ADMISSION DATE:  06/17/2014 CONSULTATION DATE:  06/17/14  REFERRING MD :  [name] >> ED  CHIEF COMPLAINT: ? Perforated viscus   INITIAL PRESENTATION: 73 y.o. F, [name] resident on chronic trach, brought to ED for concerns that PEG tube had perforated her colon. Per reports, PEG has been draining chronically, some of drainage thought to be stool. Surgeon at [name] was concerned that PEG may be transversing colon into stomach. Pt was brought to ED for evaluation of "perforated viscus"; however, upon arrival to [facility] ED, this was determined to not be the case. PCCM was called for admission. She will be admit under observation status as she is pending GI consult for colonoscopy.   STUDIES:  8/5 KUB >>> no free air, generalized bowel distention, stable gastrostomy.  8/6 >> No surgery needed, IR to evaluate the G-tube.  8/6 >> BE shows Gastrostomy tube traverses distal transverse segment. Some contrast noted to exit at skin entry site along G tube. No obstruction   SIGNIFICANT EVENTS:  8/5 admit >>> transferred from [name], concerns of perforated viscus, imaging at [facility] ED suggested this to not be the case.  8/7>> Surgery discussing with family about revision of PEG tube, Dr. Dwain Sarna to meet with family on 8/10 to discuss surgical options.   HISTORY OF PRESENT ILLNESS:  Katelyn Rojas is a 73 y.o. F with multiple co-morbidities as outlined below and who resides at [name] nursing facility. She was brought to the [facility] ED on 8/5 for concerns that her PEG tube may have perforated her viscus. She was at [facility] 3 - 4 months ago and had PEG placed by IR while she was there. Pt was then transferred to [name] 3 - 4 weeks ago. Since PEG was placed, it has been draining. Some of material draining thought to be stool. Surgeon at [facility] concerned for bowel perf, hence, transfer to [name].  In ED, KUB demonstrated no free air  therefore low suspicion for bowel perf. Multiple conversations between PCCM and [facility] regarding plan of care. Decision made to admit pt under observation status, GI will see pt tonight and decide if they will be able to perform colonoscopy to further assess whether PEG has in fact perforated bowel. If so, pt would require surgery.   PAST MEDICAL HISTORY :  Past Medical History  Diagnosis Date  . Hypertension   . 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 Arthritis   . Anxiety   . Cachexia 12/2013  . DM type 2 (diabetes mellitus, type 2)   . 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 Anemia 12/2013  . Dementia   . Compression fracture of lumbar vertebra, non-traumatic 12/2013    L3 and L1.    Past Surgical History  Procedure Laterality Date  . Vaginal hysterectomy      ? Abdominal vs. Vaginal   Prior to Admission medications   Medication Sig Start Date End Date Taking? Authorizing Provider  acetaminophen (TYLENOL) 325 MG tablet 650 mg by PEG Tube route every 6 (six) hours as needed (pain).   Yes Historical Provider, MD  albuterol (PROVENTIL HFA;VENTOLIN HFA) 108 (90 BASE) MCG/ACT inhaler Inhale 1-2 puffs into the lungs every 6 (six) hours as needed for wheezing or shortness of breath.   Yes Historical Provider, MD  aspirin 81 MG tablet 81 mg by PEG Tube route daily.   Yes Historical  Provider, MD  bacitracin ointment Apply 1 application topically See admin instructions. Apply to G-tube every shift for redness   Yes Historical Provider, MD  chlorhexidine (PERIDEX) 0.12 % solution Use as directed 15 mLs in the mouth or throat 2 (two) times daily.   Yes Historical Provider, MD  ciprofloxacin (CIPRO) 400 MG/40ML SOLN injection Inject 400 mg into the vein 2 (two) times daily. For infection 06/16/14  Yes Historical Provider, MD  citalopram (CELEXA) 20 MG tablet 20 mg by PEG Tube route daily.   Yes Historical Provider, MD  clonazePAM (KLONOPIN) 0.25 MG disintegrating tablet  0.25 mg by PEG Tube route 3 (three) times daily.   Yes Historical Provider, MD  docusate (COLACE) 50 MG/5ML liquid 100 mg by PEG Tube route 2 (two) times daily as needed (constipation).   Yes Historical Provider, MD  donepezil (ARICEPT) 10 MG tablet 10 mg by PEG Tube route at bedtime. For dementia   Yes Historical Provider, MD  heparin 5000 UNIT/ML injection Inject 5,000 Units into the skin every 8 (eight) hours. For prophylaxis. Held for surgery.   Yes Historical Provider, MD  metoprolol tartrate (LOPRESSOR) 25 MG tablet 12.5 mg by PEG Tube route every 8 (eight) hours.   Yes Historical Provider, MD  metroNIDAZOLE (FLAGYL) 50 mg/ml oral suspension Inject 500 mg into the vein every 8 (eight) hours. For infection 06/17/14  Yes Historical Provider, MD  Nutritional Supplements (ISOSOURCE 1.5 CAL) LIQD Give 50 mL/hr by tube continuous.    Yes Historical Provider, MD  oxycodone (OXY-IR) 5 MG capsule 2.5 mg by PEG Tube route every 8 (eight) hours as needed for pain.   Yes Historical Provider, MD  pantoprazole (PROTONIX) 40 MG tablet 40 mg by PEG Tube route daily. For GERD   Yes Historical Provider, MD  predniSONE (DELTASONE) 10 MG tablet 10 mg by PEG Tube route daily. For respiratory failure   Yes Historical Provider, MD  vancomycin (VANCOCIN) 250 MG capsule Inject 750 mg into the vein every 12 (twelve) hours. For infection. Draw trough 30 mins prior to 4th dose 06/16/14  Yes Historical Provider, MD  zinc sulfate 220 MG capsule Give 220 mg by tube daily.    Yes Historical Provider, MD   Allergies  Allergen Reactions  . Namenda [Memantine Hcl] Other (See Comments)    "hallucinations"   . Codeine Other (See Comments)    Unknown. Listed on [facility]  . Penicillins Other (See Comments)    Unknown; listed on [facility] MAR    FAMILY HISTORY:  Family History  Problem Relation Age of Onset  . Bleeding Disorder Mother     had cardiomegaly, and died to cerebral hemorrhage around age 75  .  Heart attack Neg Hx     No family history of heart problem   SOCIAL HISTORY:  reports that she has quit smoking. She does not have any smokeless tobacco history on file. Her alcohol and drug histories are not on file.  REVIEW OF SYSTEMS:  Unable to obtain, patient on chronic trach  SUBJECTIVE: per [facility], mild distended abdomen, Gtube clamped, seen by surgery, no surgical indication. Family will speak with Dr. Dwain Sarna on Monday to discuss surgical options.   VITAL SIGNS: Temp:  [98.6 F (37 C)-99.6 F (37.6 C)] 99 F (37.2 C) (08/07 1200) Pulse Rate:  [64-101] 85 (08/07 1301) Resp:  [11-32] 16 (08/07 1301) BP: (71-125)/(46-70) 125/52 mmHg (08/07 1301) SpO2:  [93 %-100 %] 98 % (08/07 1301) FiO2 (%):  [35 %]  35 % (08/07 1301) HEMODYNAMICS:   VENTILATOR SETTINGS: Vent Mode:  [-] PRVC FiO2 (%):  [35 %] 35 % Set Rate:  [14 bmp] 14 bmp Vt Set:  [400 mL] 400 mL PEEP:  [5 cmH20] 5 cmH20 Plateau Pressure:  [18 cmH20-25 cmH20] 18 cmH20 INTAKE / OUTPUT:  Intake/Output Summary (Last 24 hours) at 06/19/14 1409 Last data filed at 06/19/14 1200  Gross per 24 hour  Intake   2300 ml  Output    635 ml  Net   1665 ml    PHYSICAL EXAMINATION: General: Chronically ill appearing female, cachectic, resting in bed, in NAD.  Neuro: A&O x 3, non-focal. Attempts to converse.  HEENT: Temporal wasting, PERRL. MM dry.  Cardiovascular: RRR, no M/R/G.  Lungs: Respirations even and unlabored on vent. Scattered wheezes bilaterally.  Abdomen: BS x 4, soft, NT/ND. PEG in place with dried drainage noted around button.  Musculoskeletal: Muscle wasting, no edema.  Skin: Warm, no rashes, sacral decub ulcer.   LABS:  CBC  Recent Labs Lab 06/17/14 1655 06/18/14 0618 06/19/14 1100  WBC 9.9 7.6 10.0  HGB 8.4* 8.1* 8.0*  HCT 27.9* 26.6* 26.5*  PLT 290 306 341   Coag's  Recent Labs Lab 06/17/14 1632 06/17/14 1655  APTT  --  28  INR 1.25 1.16   BMET  Recent Labs Lab 06/17/14 1655  06/18/14 0630 06/19/14 1100  NA 142 140 143  K 4.0 3.9 3.8  CL 97 97 101  CO2 34* 35* 34*  BUN 22 18 13   CREATININE 0.50 0.49* 0.47*  GLUCOSE 98 149* 119*   Electrolytes  Recent Labs Lab 06/17/14 1632 06/17/14 1655 06/18/14 0630 06/19/14 1100  CALCIUM 8.9 9.2 8.8 8.7  MG 1.6  --   --   --   PHOS 4.0  --   --   --    Sepsis Markers No results found for this basename: LATICACIDVEN, PROCALCITON, O2SATVEN,  in the last 168 hours ABG No results found for this basename: PHART, PCO2ART, PO2ART,  in the last 168 hours Liver Enzymes  Recent Labs Lab 06/17/14 1632 06/17/14 1655  AST 23 25  ALT 27 30  ALKPHOS 146* 172*  BILITOT 0.3 0.4  ALBUMIN 2.5* 3.1*   Cardiac Enzymes No results found for this basename: TROPONINI, PROBNP,  in the last 168 hours Glucose  Recent Labs Lab 06/18/14 1559 06/18/14 2032 06/19/14 0028 06/19/14 0344 06/19/14 0816 06/19/14 1225  GLUCAP 130* 138* 111* 119* 174* 104*    Imaging Ir Fluoro Guide Cv Line Right  06/18/2014   CLINICAL DATA:  Coloenteric fistula. Inadvertent removal of previously placed PICC catheter during patient transfer.  EXAM: PICC PLACEMENT WITH ULTRASOUND AND FLUOROSCOPY  FLUOROSCOPY TIME:  6 seconds  TECHNIQUE: After written informed consent was obtained, patient was placed in the supine position on angiographic table. Patency of the right basilic vein was confirmed with ultrasound with image documentation. An appropriate skin site was determined. Skin site was marked. Region was prepped using maximum barrier technique including cap and mask, sterile gown, sterile gloves, large sterile sheet, and Chlorhexidine as cutaneous antisepsis. The region was infiltrated locally with 1% lidocaine. Under real-time ultrasound guidance, the right basilic vein was accessed with a 21 gauge micropuncture needle; the needle tip within the vein was confirmed with ultrasound image documentation. Needle exchanged over a 018 guidewire for a  peel-away sheath, through which a 5-French double-lumen power injectable PICC trimmed to 35cm was advanced, positioned with its tip near the  cavoatrial junction. Spot chest radiograph confirms appropriate catheter position. Catheter was flushed per protocol and secured externally with 0-Prolene sutures. The patient tolerated procedure well, with no immediate complication.  IMPRESSION: Technically successful five [place] double lumen power injectable PICC placement   Electronically Signed   By: Oley Balm M.D.   On: 06/18/2014 15:48   Ir Fluoro Procedure Unlisted  06/18/2014   CLINICAL DATA:  Feculent drainage around previously placed gastrostomy catheter. Previous CT suggested possible trans colonic placement.  EXAM: SINGLE COLUMN BARIUM ENEMA  TECHNIQUE: Initial scout AP supine abdominal image obtained to insure adequate colon cleansing. Barium was introduced into the colon in a retrograde fashion . Spot images of the colon followed by overhead radiographs were obtained.  FLUOROSCOPY TIME:  6 seconds  COMPARISON:  CT 05/08/2014 and earlier studies  FINDINGS: The gastrostomy catheter traverses the distal transverse segment of colon. During the procedure, a small amount of barium was noted to exit adjacent to the gastrostomy tube at its skin entry site. No evidence of obstruction. Descending and sigmoid segments are unremarkable. Rectum distends normally. Ascending colon incompletely evaluated.  IMPRESSION: 1. Gastrostomy catheter traverses the distal transverse colon. I telephoned the critical test results to OSBORNE,KELLY E PA at the time of interpretation.   Electronically Signed   By: Oley Balm M.D.   On: 06/18/2014 15:47   Ir US Guide Vasc Access Right  06/18/2014   CLINICAL DATA:  Coloenteric fistula. Inadvertent removal of previously placed PICC catheter during patient transfer.  EXAM: PICC PLACEMENT WITH ULTRASOUND AND FLUOROSCOPY  FLUOROSCOPY TIME:  6 seconds  TECHNIQUE: After written informed  consent was obtained, patient was placed in the supine position on angiographic table. Patency of the right basilic vein was confirmed with ultrasound with image documentation. An appropriate skin site was determined. Skin site was marked. Region was prepped using maximum barrier technique including cap and mask, sterile gown, sterile gloves, large sterile sheet, and Chlorhexidine as cutaneous antisepsis. The region was infiltrated locally with 1% lidocaine. Under real-time ultrasound guidance, the right basilic vein was accessed with a 21 gauge micropuncture needle; the needle tip within the vein was confirmed with ultrasound image documentation. Needle exchanged over a 018 guidewire for a peel-away sheath, through which a 5-French double-lumen power injectable PICC trimmed to 35cm was advanced, positioned with its tip near the cavoatrial junction. Spot chest radiograph confirms appropriate catheter position. Catheter was flushed per protocol and secured externally with 0-Prolene sutures. The patient tolerated procedure well, with no immediate complication.  IMPRESSION: Technically successful five [place] double lumen power injectable PICC placement   Electronically Signed   By: Oley Balm M.D.   On: 06/18/2014 15:48     ASSESSMENT / PLAN:  PULMONARY  Trach 04/01/14  A:  Chronic respiratory failure  COPD  Tracheostomy Status  P:  Full vent support.  Wean as able.  Albuterol PRN.   CARDIOVASCULAR  A:  Chronic Hypotension - per son, pt's SBP usually in 80's.  P:  Stress steroids. 50 mg q6hrs Hold outpatient lopressor.   RENAL  A:  No acute issues.  P:  BMP in AM.   GASTROINTESTINAL  A:  PEG malfunctioning  ? Bowel perforation  Severe protein calorie malnutrition   P:  NPO.  Pantoprazole.  GI consulted- not a candidate for colonoscopy Seen by surgery - Dr. Dwain Sarna to discuss surgical options with Family on 8/10   HEMATOLOGIC  A:  Chronic Anemia  P:  VTE prophylaxis:  Heparin.  Transfuse for Hgb < 7.  Check coags.  CBC in AM.   INFECTIOUS  A:  Hx MRSA and enterococcus bacteremia Hx Tracheitis (cultures + for Alcaligenes bacteria)  Sacra decubitus ulcer  P:  Abx: Vanc, Cipro, Flagyl (from [facility]).  Monitor fever curve / WBC's.   ENDOCRINE  A:  DM  At risk AI - on prednisone chronically.  P:  SSI.  Stress steroids.   NEUROLOGIC  A:  Dementia  Anxiety  P:  RASS goal: 0.  Fentanyl, Ativan PRN.  Hold outpatient Celexa, Clonopin, Aricept.  Monitor.     TODAY'S SUMMARY:73 y.o. F from [name] on chronic trach, brought to [facility] for concerns that her PEG tube had perforated her bowel and was transversing into stomach. Imaging in ED suggested otherwise. Admitting under observation status but now changed to inpatient.  Currently a difficult surgical case, and Dr. Dwain Sarna will discuss surgical options with Family on 8/10.  Will transfer to stepdown unit.    I have personally obtained a history, examined the patient, evaluated laboratory and imaging results, formulated the assessment and plan and placed orders. CRITICAL CARE: The patient is critically ill with multiple organ systems failure and requires high complexity decision making for assessment and support, frequent evaluation and titration of therapies, application of advanced monitoring technologies and extensive interpretation of multiple databases. Critical Care Time devoted to patient care services described in this note is 30 minutes.   Stephanie Acre, MD [facility] Pager [phone] On Call Pager [phone]   06/19/2014, 2:09 PM

## 2014-06-19 NOTE — Progress Notes (Signed)
Patient ID: Katelyn Rojas, female   DOB: May 25, 1941, 73 y.o.   MRN: [id number]     [facility]      [address] [place], [place] [address]    Phone: [phone] FAX: [phone]     Subjective: No changes.  VSS.  Afebrile.    Objective:  Vital signs:  Filed Vitals:   06/19/14 0700 06/19/14 0757 06/19/14 0800 06/19/14 0845  BP: 124/56 124/56 121/54   Pulse: 95 101 93   Temp:    99 F (37.2 C)  TempSrc:    Oral  Resp: 25 16 26    Weight:      SpO2: 93% 98% 100%     Last BM Date:  (pta)  Intake/Output   Yesterday:  08/06 0701 - 08/07 0700 In: 2380 [I.V.:1200; NG/GT:270; IV Piggyback:900] Out: 1160 [Urine:1160] This shift:  Total I/O In: 280 [I.V.:50; NG/GT:30; IV Piggyback:200] Out: -   Physical Exam:  General: Pt awake and in no acute distress. ity  Abdomen: Soft. Nondistended. Mild tenderness to suprapubic region with a palpable mass to LLQ. PEG tube with fecal matter around it, I cleansed this, there is mild irritation, but no cellulitis, a dressing was placed. . No evidence of peritonitis. No incarcerated hernias.    Problem List:   Active Problems:   Abdominal pain   Congestive dilated cardiomyopathy    Results:   Labs: Results for orders placed during the hospital encounter of 06/17/14 (from the past 48 hour(s))  PREALBUMIN     Status: None   Collection Time    06/17/14  4:32 PM      Result Value Ref Range   Prealbumin 19.2  17.0 - 34.0 mg/dL   Comment: Performed at [facility]  CBC     Status: Abnormal   Collection Time    06/17/14  4:32 PM      Result Value Ref Range   WBC 7.5  4.0 - 10.5 K/uL   RBC 2.31 (*) 3.87 - 5.11 MIL/uL   Hemoglobin 7.3 (*) 12.0 - 15.0 g/dL   HCT 23.8 (*) 36.0 - 46.0 %   MCV 103.0 (*) 78.0 - 100.0 fL   MCH 31.6  26.0 - 34.0 pg   MCHC 30.7  30.0 - 36.0 g/dL   RDW 15.3  11.5 - 15.5 %   Platelets 247  150 - 400 K/uL  COMPREHENSIVE METABOLIC PANEL      Status: Abnormal   Collection Time    06/17/14  4:32 PM      Result Value Ref Range   Sodium 139  137 - 147 mEq/L   Potassium 4.2  3.7 - 5.3 mEq/L   Chloride 96  96 - 112 mEq/L   CO2 30  19 - 32 mEq/L   Glucose, Bld 71  70 - 99 mg/dL   BUN 23  6 - 23 mg/dL   Creatinine, Ser 0.50  0.50 - 1.10 mg/dL   Calcium 8.9  8.4 - 10.5 mg/dL   Total Protein 5.8 (*) 6.0 - 8.3 g/dL   Albumin 2.5 (*) 3.5 - 5.2 g/dL   AST 23  0 - 37 U/L   ALT 27  0 - 35 U/L   Alkaline Phosphatase 146 (*) 39 - 117 U/L   Total Bilirubin 0.3  0.3 - 1.2 mg/dL   GFR calc non Af Amer >90  >90 mL/min   GFR calc Af Amer >90  >90 mL/min  Comment: (NOTE)     The eGFR has been calculated using the CKD EPI equation.     This calculation has not been validated in all clinical situations.     eGFR's persistently <90 mL/min signify possible Chronic Kidney     Disease.   Anion gap 13  5 - 15  PROTIME-INR     Status: Abnormal   Collection Time    06/17/14  4:32 PM      Result Value Ref Range   Prothrombin Time 15.7 (*) 11.6 - 15.2 seconds   INR 1.25  0.00 - 1.49  MAGNESIUM     Status: None   Collection Time    06/17/14  4:32 PM      Result Value Ref Range   Magnesium 1.6  1.5 - 2.5 mg/dL  PHOSPHORUS     Status: None   Collection Time    06/17/14  4:32 PM      Result Value Ref Range   Phosphorus 4.0  2.3 - 4.6 mg/dL  CBC     Status: Abnormal   Collection Time    06/17/14  4:55 PM      Result Value Ref Range   WBC 9.9  4.0 - 10.5 K/uL   RBC 2.70 (*) 3.87 - 5.11 MIL/uL   Hemoglobin 8.4 (*) 12.0 - 15.0 g/dL   HCT 27.9 (*) 36.0 - 46.0 %   MCV 103.3 (*) 78.0 - 100.0 fL   MCH 31.1  26.0 - 34.0 pg   MCHC 30.1  30.0 - 36.0 g/dL   RDW 15.4  11.5 - 15.5 %   Platelets 290  150 - 400 K/uL  COMPREHENSIVE METABOLIC PANEL     Status: Abnormal   Collection Time    06/17/14  4:55 PM      Result Value Ref Range   Sodium 142  137 - 147 mEq/L   Potassium 4.0  3.7 - 5.3 mEq/L   Chloride 97  96 - 112 mEq/L   CO2 34 (*) 19 - 32  mEq/L   Glucose, Bld 98  70 - 99 mg/dL   BUN 22  6 - 23 mg/dL   Creatinine, Ser 0.50  0.50 - 1.10 mg/dL   Calcium 9.2  8.4 - 10.5 mg/dL   Total Protein 6.9  6.0 - 8.3 g/dL   Albumin 3.1 (*) 3.5 - 5.2 g/dL   AST 25  0 - 37 U/L   ALT 30  0 - 35 U/L   Alkaline Phosphatase 172 (*) 39 - 117 U/L   Total Bilirubin 0.4  0.3 - 1.2 mg/dL   GFR calc non Af Amer >90  >90 mL/min   GFR calc Af Amer >90  >90 mL/min   Comment: (NOTE)     The eGFR has been calculated using the CKD EPI equation.     This calculation has not been validated in all clinical situations.     eGFR's persistently <90 mL/min signify possible Chronic Kidney     Disease.   Anion gap 11  5 - 15  PROTIME-INR     Status: None   Collection Time    06/17/14  4:55 PM      Result Value Ref Range   Prothrombin Time 14.8  11.6 - 15.2 seconds   INR 1.16  0.00 - 1.49  APTT     Status: None   Collection Time    06/17/14  4:55 PM      Result Value Ref Range  aPTT 28  24 - 37 seconds  GLUCOSE, CAPILLARY     Status: None   Collection Time    06/17/14  6:20 PM      Result Value Ref Range   Glucose-Capillary 90  70 - 99 mg/dL  MRSA PCR SCREENING     Status: Abnormal   Collection Time    06/17/14  6:28 PM      Result Value Ref Range   MRSA by PCR POSITIVE (*) NEGATIVE   Comment:            The GeneXpert MRSA Assay (FDA     approved for NASAL specimens     only), is one component of a     comprehensive MRSA colonization     surveillance program. It is not     intended to diagnose MRSA     infection nor to guide or     monitor treatment for     MRSA infections.     RESULT CALLED TO, READ BACK BY AND VERIFIED WITH:     A BECK RN 2031 06/17/14 A BROWNING  URINALYSIS, ROUTINE W REFLEX MICROSCOPIC     Status: Abnormal   Collection Time    06/17/14  6:28 PM      Result Value Ref Range   Color, Urine YELLOW  YELLOW   APPearance TURBID (*) CLEAR   Specific Gravity, Urine 1.012  1.005 - 1.030   pH 7.5  5.0 - 8.0   Glucose, UA  NEGATIVE  NEGATIVE mg/dL   Hgb urine dipstick MODERATE (*) NEGATIVE   Bilirubin Urine NEGATIVE  NEGATIVE   Ketones, ur NEGATIVE  NEGATIVE mg/dL   Protein, ur NEGATIVE  NEGATIVE mg/dL   Urobilinogen, UA 1.0  0.0 - 1.0 mg/dL   Nitrite POSITIVE (*) NEGATIVE   Leukocytes, UA LARGE (*) NEGATIVE  URINE MICROSCOPIC-ADD ON     Status: Abnormal   Collection Time    06/17/14  6:28 PM      Result Value Ref Range   Squamous Epithelial / LPF FEW (*) RARE   WBC, UA 7-10  <3 WBC/hpf   RBC / HPF 0-2  <3 RBC/hpf   Bacteria, UA MANY (*) RARE   Crystals CA OXALATE CRYSTALS (*) NEGATIVE   Urine-Other MANY YEAST    VANCOMYCIN, TROUGH     Status: None   Collection Time    06/17/14  9:25 PM      Result Value Ref Range   Vancomycin Tr 19.3  10.0 - 20.0 ug/mL  GLUCOSE, CAPILLARY     Status: Abnormal   Collection Time    06/18/14 12:08 AM      Result Value Ref Range   Glucose-Capillary 112 (*) 70 - 99 mg/dL   Comment 1 Documented in Chart     Comment 2 Notify RN    GLUCOSE, CAPILLARY     Status: Abnormal   Collection Time    06/18/14  3:43 AM      Result Value Ref Range   Glucose-Capillary 117 (*) 70 - 99 mg/dL   Comment 1 Documented in Chart     Comment 2 Notify RN    CBC     Status: Abnormal   Collection Time    06/18/14  6:18 AM      Result Value Ref Range   WBC 7.6  4.0 - 10.5 K/uL   RBC 2.65 (*) 3.87 - 5.11 MIL/uL   Hemoglobin 8.1 (*) 12.0 - 15.0 g/dL   HCT  26.6 (*) 36.0 - 46.0 %   MCV 100.4 (*) 78.0 - 100.0 fL   MCH 30.6  26.0 - 34.0 pg   MCHC 30.5  30.0 - 36.0 g/dL   RDW 15.3  11.5 - 15.5 %   Platelets 306  150 - 400 K/uL  BASIC METABOLIC PANEL     Status: Abnormal   Collection Time    06/18/14  6:30 AM      Result Value Ref Range   Sodium 140  137 - 147 mEq/L   Potassium 3.9  3.7 - 5.3 mEq/L   Chloride 97  96 - 112 mEq/L   CO2 35 (*) 19 - 32 mEq/L   Glucose, Bld 149 (*) 70 - 99 mg/dL   BUN 18  6 - 23 mg/dL   Creatinine, Ser 0.49 (*) 0.50 - 1.10 mg/dL   Calcium 8.8  8.4 -  10.5 mg/dL   GFR calc non Af Amer >90  >90 mL/min   GFR calc Af Amer >90  >90 mL/min   Comment: (NOTE)     The eGFR has been calculated using the CKD EPI equation.     This calculation has not been validated in all clinical situations.     eGFR's persistently <90 mL/min signify possible Chronic Kidney     Disease.   Anion gap 8  5 - 15  GLUCOSE, CAPILLARY     Status: Abnormal   Collection Time    06/18/14  8:11 AM      Result Value Ref Range   Glucose-Capillary 168 (*) 70 - 99 mg/dL  GLUCOSE, CAPILLARY     Status: Abnormal   Collection Time    06/18/14 11:35 AM      Result Value Ref Range   Glucose-Capillary 177 (*) 70 - 99 mg/dL  GLUCOSE, CAPILLARY     Status: Abnormal   Collection Time    06/18/14  3:59 PM      Result Value Ref Range   Glucose-Capillary 130 (*) 70 - 99 mg/dL  GLUCOSE, CAPILLARY     Status: Abnormal   Collection Time    06/18/14  8:32 PM      Result Value Ref Range   Glucose-Capillary 138 (*) 70 - 99 mg/dL  GLUCOSE, CAPILLARY     Status: Abnormal   Collection Time    06/19/14 12:28 AM      Result Value Ref Range   Glucose-Capillary 111 (*) 70 - 99 mg/dL  GLUCOSE, CAPILLARY     Status: Abnormal   Collection Time    06/19/14  3:44 AM      Result Value Ref Range   Glucose-Capillary 119 (*) 70 - 99 mg/dL    Imaging / Studies: Ir Fluoro Guide Cv Line Right  06/18/2014   CLINICAL DATA:  Coloenteric fistula. Inadvertent removal of previously placed PICC catheter during patient transfer.  EXAM: PICC PLACEMENT WITH ULTRASOUND AND FLUOROSCOPY  FLUOROSCOPY TIME:  6 seconds  TECHNIQUE: After written informed consent was obtained, patient was placed in the supine position on angiographic table. Patency of the right basilic vein was confirmed with ultrasound with image documentation. An appropriate skin site was determined. Skin site was marked. Region was prepped using maximum barrier technique including cap and mask, sterile gown, sterile gloves, large sterile sheet,  and Chlorhexidine as cutaneous antisepsis. The region was infiltrated locally with 1% lidocaine. Under real-time ultrasound guidance, the right basilic vein was accessed with a 21 gauge micropuncture needle; the needle tip within the  vein was confirmed with ultrasound image documentation. Needle exchanged over a 018 guidewire for a peel-away sheath, through which a 5-French double-lumen power injectable PICC trimmed to 35cm was advanced, positioned with its tip near the cavoatrial junction. Spot chest radiograph confirms appropriate catheter position. Catheter was flushed per protocol and secured externally with 0-Prolene sutures. The patient tolerated procedure well, with no immediate complication.  IMPRESSION: Technically successful five [place] double lumen power injectable PICC placement   Electronically Signed   By: Arne Cleveland M.D.   On: 06/18/2014 15:48   Ir Fluoro Procedure Unlisted  06/18/2014   CLINICAL DATA:  Feculent drainage around previously placed gastrostomy catheter. Previous CT suggested possible trans colonic placement.  EXAM: SINGLE COLUMN BARIUM ENEMA  TECHNIQUE: Initial scout AP supine abdominal image obtained to insure adequate colon cleansing. Barium was introduced into the colon in a retrograde fashion . Spot images of the colon followed by overhead radiographs were obtained.  FLUOROSCOPY TIME:  6 seconds  COMPARISON:  CT 05/08/2014 and earlier studies  FINDINGS: The gastrostomy catheter traverses the distal transverse segment of colon. During the procedure, a small amount of barium was noted to exit adjacent to the gastrostomy tube at its skin entry site. No evidence of obstruction. Descending and sigmoid segments are unremarkable. Rectum distends normally. Ascending colon incompletely evaluated.  IMPRESSION: 1. Gastrostomy catheter traverses the distal transverse colon. I telephoned the critical test results to OSBORNE,KELLY E PA at the time of interpretation.   Electronically Signed    By: Arne Cleveland M.D.   On: 06/18/2014 15:47   Ir US Guide Vasc Access Right  06/18/2014   CLINICAL DATA:  Coloenteric fistula. Inadvertent removal of previously placed PICC catheter during patient transfer.  EXAM: PICC PLACEMENT WITH ULTRASOUND AND FLUOROSCOPY  FLUOROSCOPY TIME:  6 seconds  TECHNIQUE: After written informed consent was obtained, patient was placed in the supine position on angiographic table. Patency of the right basilic vein was confirmed with ultrasound with image documentation. An appropriate skin site was determined. Skin site was marked. Region was prepped using maximum barrier technique including cap and mask, sterile gown, sterile gloves, large sterile sheet, and Chlorhexidine as cutaneous antisepsis. The region was infiltrated locally with 1% lidocaine. Under real-time ultrasound guidance, the right basilic vein was accessed with a 21 gauge micropuncture needle; the needle tip within the vein was confirmed with ultrasound image documentation. Needle exchanged over a 018 guidewire for a peel-away sheath, through which a 5-French double-lumen power injectable PICC trimmed to 35cm was advanced, positioned with its tip near the cavoatrial junction. Spot chest radiograph confirms appropriate catheter position. Catheter was flushed per protocol and secured externally with 0-Prolene sutures. The patient tolerated procedure well, with no immediate complication.  IMPRESSION: Technically successful five [place] double lumen power injectable PICC placement   Electronically Signed   By: Arne Cleveland M.D.   On: 06/18/2014 15:48   Dg Chest Portable 1 View  06/17/2014   CLINICAL DATA:  Sepsis.  Abdominal pain.  EXAM: PORTABLE CHEST - 1 VIEW  COMPARISON:  Chest x-ray 05/21/2014.  FINDINGS: Tracheostomy tube in position with tip terminating approximately 8.5 cm above the carina. Right upper extremity PICC with tip terminating at the superior cavoatrial junction. Improved aeration throughout the  lung bases bilaterally, suggesting resolution of areas of atelectasis and/or consolidation on the prior study. Residual bibasilar opacities may reflect residual areas of atelectasis or scarring. No definite acute consolidative airspace disease. Trace bilateral pleural effusions versus chronic pleuroparenchymal scarring. No  evidence of pulmonary edema. Heart size is normal. Atherosclerosis in the thoracic aorta. Multiple old healed bilateral rib fractures are again noted.  IMPRESSION: 1. Support apparatus, as above. 2. Overall, there has significantly improved aeration throughout the lung bases bilaterally, although the continue be areas of mild scarring and/or subsegmental atelectasis. 3. Possible trace bilateral pleural effusions. 4. Atherosclerosis.   Electronically Signed   By: Vinnie Langton M.D.   On: 06/17/2014 14:03   Dg Abd Portable 1v  06/17/2014   CLINICAL DATA:  Sepsis and abdominal pain.  EXAM: PORTABLE ABDOMEN - 1 VIEW  COMPARISON:  05/20/2014  FINDINGS: Stable gastrostomy tube. Generalized bowel distention is present. No obvious free intraperitoneal gas. Osteopenia.  IMPRESSION: Generalized bowel distention.  Stable gastrostomy.   Electronically Signed   By: Maryclare Bean M.D.   On: 06/17/2014 14:01    Medications / Allergies:  Scheduled Meds: . antiseptic oral rinse  7 mL Mouth Rinse QID  . chlorhexidine  15 mL Mouth Rinse BID  . ciprofloxacin  400 mg Intravenous Q12H  . heparin subcutaneous  5,000 Units Subcutaneous 3 times per day  . hydrocortisone sod succinate (SOLU-CORTEF) inj  50 mg Intravenous 4 times per day  . metronidazole  500 mg Intravenous Q8H  . pantoprazole (PROTONIX) IV  40 mg Intravenous Q24H  . vancomycin  1,000 mg Intravenous Q24H   Continuous Infusions: . dextrose 5 % and 0.45 % NaCl with KCl 20 mEq/L 50 mL/hr at 06/18/14 1817  . feeding supplement (OSMOLITE 1.2 CAL) 1,000 mL (06/18/14 2200)   PRN Meds:.albuterol, fentaNYL, fentaNYL,  LORazepam  Antibiotics: Anti-infectives   Start     Dose/Rate Route Frequency Ordered Stop   06/18/14 0600  vancomycin (VANCOCIN) IVPB 1000 mg/200 mL premix     1,000 mg 200 mL/hr over 60 Minutes Intravenous Every 24 hours 06/17/14 2234     06/17/14 2100  ciprofloxacin (CIPRO) IVPB 400 mg     400 mg 200 mL/hr over 60 Minutes Intravenous Every 12 hours 06/17/14 1816     06/17/14 1700  metroNIDAZOLE (FLAGYL) IVPB 500 mg     500 mg 100 mL/hr over 60 Minutes Intravenous Every 8 hours 06/17/14 1658        Assessment  1. G tube transverses distal transverse colon 2. Advanced dementia  3. Chronic ventilator dependent respiratory failure  4. Protein calorie malnutrition  5. Debilitated  6 history of PEA arrest in May 2015  Plan  -hold TF -await 2D echo results, if stable we would need a decision from the family whether or not to proceed with surgery early today.  Dr. Redmond Pulling discussed in detail the different options with the patients niece.  -continue wound care to PEG tube site, no signs of infection.   -hold heparin -repeat labs to ensure h&h are stable -IV hydration -further management per primary team, appreciate assistance    Erby Pian, [facility] Pager [phone] Office [phone]  06/19/2014 8:58 AM

## 2014-06-20 DIAGNOSIS — J9600 Acute respiratory failure, unspecified whether with hypoxia or hypercapnia: Secondary | ICD-10-CM

## 2014-06-20 LAB — GLUCOSE, CAPILLARY
GLUCOSE-CAPILLARY: 131 mg/dL — AB (ref 70–99)
Glucose-Capillary: 114 mg/dL — ABNORMAL HIGH (ref 70–99)
Glucose-Capillary: 98 mg/dL (ref 70–99)
Glucose-Capillary: 113 mg/dL — ABNORMAL HIGH (ref 70–99)
Glucose-Capillary: 102 mg/dL — ABNORMAL HIGH (ref 70–99)

## 2014-06-20 MED ORDER — VITAL HIGH PROTEIN PO LIQD
1000.0000 mL | ORAL | Status: DC
  Administered 2014-06-20: 1000 mL
  Filled 2014-06-20 (×4): qty 1000

## 2014-06-20 NOTE — Progress Notes (Signed)
Patient ID: Katelyn Rojas, female   DOB: 13-Dec-1940, 73 y.o.   MRN: [id number]     [place]      [address] [place], [place] [address]    Phone: [phone] FAX: [phone]     Subjective: No complaints.    Objective:  Vital signs:  Filed Vitals:   06/20/14 0600 06/20/14 0812 06/20/14 0828 06/20/14 0900  BP: 103/48 119/94  95/46  Pulse: 62 56  59  Temp:   98.3 F (36.8 C)   TempSrc:   Axillary   Resp: _0 Weight:      SpO2: 100% 100%  100%    Last BM Date: 06/18/14  Intake/Output   Yesterday:  08/07 0701 - 08/08 0700 In: 2080 [I.V.:1150; NG/GT:30; IV Piggyback:900] Out: 1779 [Urine:1085] This shift:    I/O last 3 completed shifts: In: 3903 [I.V.:1750; NG/GT:300; IV Piggyback:1400] Out: 0092 [Urine:1445]    Physical Exam:  General: Pt awake and in no acute distress.  Abdomen: Soft. Nondistended. Mild tenderness to suprapubic region with a palpable mass to LLQ. PEG tube with fecal matter around it.   No evidence of peritonitis. No incarcerated hernias.    Problem List:   Active Problems:   Abdominal pain   Congestive dilated cardiomyopathy   Colon perforation    Results:   Labs: Results for orders placed during the hospital encounter of 06/17/14 (from the past 48 hour(s))  GLUCOSE, CAPILLARY     Status: Abnormal   Collection Time    06/18/14 11:35 AM      Result Value Ref Range   Glucose-Capillary 177 (*) 70 - 99 mg/dL  GLUCOSE, CAPILLARY     Status: Abnormal   Collection Time    06/18/14  3:59 PM      Result Value Ref Range   Glucose-Capillary 130 (*) 70 - 99 mg/dL  GLUCOSE, CAPILLARY     Status: Abnormal   Collection Time    06/18/14  8:32 PM      Result Value Ref Range   Glucose-Capillary 138 (*) 70 - 99 mg/dL  GLUCOSE, CAPILLARY     Status: Abnormal   Collection Time    06/19/14 12:28 AM      Result Value Ref Range   Glucose-Capillary 111 (*) 70 - 99 mg/dL  GLUCOSE,  CAPILLARY     Status: Abnormal   Collection Time    06/19/14  3:44 AM      Result Value Ref Range   Glucose-Capillary 119 (*) 70 - 99 mg/dL  GLUCOSE, CAPILLARY     Status: Abnormal   Collection Time    06/19/14  8:16 AM      Result Value Ref Range   Glucose-Capillary 174 (*) 70 - 99 mg/dL  CBC     Status: Abnormal   Collection Time    06/19/14 11:00 AM      Result Value Ref Range   WBC 10.0  4.0 - 10.5 K/uL   RBC 2.63 (*) 3.87 - 5.11 MIL/uL   Hemoglobin 8.0 (*) 12.0 - 15.0 g/dL   HCT 26.5 (*) 36.0 - 46.0 %   MCV 100.8 (*) 78.0 - 100.0 fL   MCH 30.4  26.0 - 34.0 pg   MCHC 30.2  30.0 - 36.0 g/dL   RDW 15.1  11.5 - 15.5 %   Platelets 341  150 - 400 K/uL  BASIC METABOLIC PANEL     Status: Abnormal  Collection Time    06/19/14 11:00 AM      Result Value Ref Range   Sodium 143  137 - 147 mEq/L   Potassium 3.8  3.7 - 5.3 mEq/L   Chloride 101  96 - 112 mEq/L   CO2 34 (*) 19 - 32 mEq/L   Glucose, Bld 119 (*) 70 - 99 mg/dL   BUN 13  6 - 23 mg/dL   Creatinine, Ser 0.47 (*) 0.50 - 1.10 mg/dL   Calcium 8.7  8.4 - 10.5 mg/dL   GFR calc non Af Amer >90  >90 mL/min   GFR calc Af Amer >90  >90 mL/min   Comment: (NOTE)     The eGFR has been calculated using the CKD EPI equation.     This calculation has not been validated in all clinical situations.     eGFR's persistently <90 mL/min signify possible Chronic Kidney     Disease.   Anion gap 8  5 - 15  GLUCOSE, CAPILLARY     Status: Abnormal   Collection Time    06/19/14 12:25 PM      Result Value Ref Range   Glucose-Capillary 104 (*) 70 - 99 mg/dL  GLUCOSE, CAPILLARY     Status: Abnormal   Collection Time    06/19/14  4:21 PM      Result Value Ref Range   Glucose-Capillary 114 (*) 70 - 99 mg/dL  GLUCOSE, CAPILLARY     Status: Abnormal   Collection Time    06/19/14  8:07 PM      Result Value Ref Range   Glucose-Capillary 115 (*) 70 - 99 mg/dL  GLUCOSE, CAPILLARY     Status: Abnormal   Collection Time    06/19/14 11:28 PM       Result Value Ref Range   Glucose-Capillary 121 (*) 70 - 99 mg/dL  GLUCOSE, CAPILLARY     Status: Abnormal   Collection Time    06/20/14  4:03 AM      Result Value Ref Range   Glucose-Capillary 114 (*) 70 - 99 mg/dL  GLUCOSE, CAPILLARY     Status: None   Collection Time    06/20/14  8:25 AM      Result Value Ref Range   Glucose-Capillary 98  70 - 99 mg/dL    Imaging / Studies: Ir Fluoro Guide Cv Line Right  06/18/2014   CLINICAL DATA:  Coloenteric fistula. Inadvertent removal of previously placed PICC catheter during patient transfer.  EXAM: PICC PLACEMENT WITH ULTRASOUND AND FLUOROSCOPY  FLUOROSCOPY TIME:  6 seconds  TECHNIQUE: After written informed consent was obtained, patient was placed in the supine position on angiographic table. Patency of the right basilic vein was confirmed with ultrasound with image documentation. An appropriate skin site was determined. Skin site was marked. Region was prepped using maximum barrier technique including cap and mask, sterile gown, sterile gloves, large sterile sheet, and Chlorhexidine as cutaneous antisepsis. The region was infiltrated locally with 1% lidocaine. Under real-time ultrasound guidance, the right basilic vein was accessed with a 21 gauge micropuncture needle; the needle tip within the vein was confirmed with ultrasound image documentation. Needle exchanged over a 018 guidewire for a peel-away sheath, through which a 5-French double-lumen power injectable PICC trimmed to 35cm was advanced, positioned with its tip near the cavoatrial junction. Spot chest radiograph confirms appropriate catheter position. Catheter was flushed per protocol and secured externally with 0-Prolene sutures. The patient tolerated procedure well, with no immediate complication.  IMPRESSION: Technically successful five [place] double lumen power injectable PICC placement   Electronically Signed   By: Arne Cleveland M.D.   On: 06/18/2014 15:48   Ir Fluoro Procedure  Unlisted  06/18/2014   CLINICAL DATA:  Feculent drainage around previously placed gastrostomy catheter. Previous CT suggested possible trans colonic placement.  EXAM: SINGLE COLUMN BARIUM ENEMA  TECHNIQUE: Initial scout AP supine abdominal image obtained to insure adequate colon cleansing. Barium was introduced into the colon in a retrograde fashion . Spot images of the colon followed by overhead radiographs were obtained.  FLUOROSCOPY TIME:  6 seconds  COMPARISON:  CT 05/08/2014 and earlier studies  FINDINGS: The gastrostomy catheter traverses the distal transverse segment of colon. During the procedure, a small amount of barium was noted to exit adjacent to the gastrostomy tube at its skin entry site. No evidence of obstruction. Descending and sigmoid segments are unremarkable. Rectum distends normally. Ascending colon incompletely evaluated.  IMPRESSION: 1. Gastrostomy catheter traverses the distal transverse colon. I telephoned the critical test results to OSBORNE,KELLY E PA at the time of interpretation.   Electronically Signed   By: Arne Cleveland M.D.   On: 06/18/2014 15:47   Ir US Guide Vasc Access Right  06/18/2014   CLINICAL DATA:  Coloenteric fistula. Inadvertent removal of previously placed PICC catheter during patient transfer.  EXAM: PICC PLACEMENT WITH ULTRASOUND AND FLUOROSCOPY  FLUOROSCOPY TIME:  6 seconds  TECHNIQUE: After written informed consent was obtained, patient was placed in the supine position on angiographic table. Patency of the right basilic vein was confirmed with ultrasound with image documentation. An appropriate skin site was determined. Skin site was marked. Region was prepped using maximum barrier technique including cap and mask, sterile gown, sterile gloves, large sterile sheet, and Chlorhexidine as cutaneous antisepsis. The region was infiltrated locally with 1% lidocaine. Under real-time ultrasound guidance, the right basilic vein was accessed with a 21 gauge micropuncture  needle; the needle tip within the vein was confirmed with ultrasound image documentation. Needle exchanged over a 018 guidewire for a peel-away sheath, through which a 5-French double-lumen power injectable PICC trimmed to 35cm was advanced, positioned with its tip near the cavoatrial junction. Spot chest radiograph confirms appropriate catheter position. Catheter was flushed per protocol and secured externally with 0-Prolene sutures. The patient tolerated procedure well, with no immediate complication.  IMPRESSION: Technically successful five [place] double lumen power injectable PICC placement   Electronically Signed   By: Arne Cleveland M.D.   On: 06/18/2014 15:48    Medications / Allergies:  Scheduled Meds: . antiseptic oral rinse  7 mL Mouth Rinse QID  . chlorhexidine  15 mL Mouth Rinse BID  . ciprofloxacin  400 mg Intravenous Q12H  . heparin subcutaneous  5,000 Units Subcutaneous 3 times per day  . hydrocortisone sod succinate (SOLU-CORTEF) inj  50 mg Intravenous 4 times per day  . metronidazole  500 mg Intravenous Q8H  . pantoprazole (PROTONIX) IV  40 mg Intravenous Q24H  . vancomycin  1,000 mg Intravenous Q24H   Continuous Infusions: . dextrose 5 % and 0.45 % NaCl with KCl 20 mEq/L 50 mL/hr at 06/19/14 1137   PRN Meds:.albuterol, fentaNYL, LORazepam  Antibiotics: Anti-infectives   Start     Dose/Rate Route Frequency Ordered Stop   06/18/14 0600  vancomycin (VANCOCIN) IVPB 1000 mg/200 mL premix     1,000 mg 200 mL/hr over 60 Minutes Intravenous Every 24 hours 06/17/14 2234     06/17/14 2100  ciprofloxacin (CIPRO)  IVPB 400 mg     400 mg 200 mL/hr over 60 Minutes Intravenous Every 12 hours 06/17/14 1816     06/17/14 1700  metroNIDAZOLE (FLAGYL) IVPB 500 mg     500 mg 100 mL/hr over 60 Minutes Intravenous Every 8 hours 06/17/14 1658        Assessment  1. G tube transverses distal transverse colon x3 months  2. Advanced dementia  3. Chronic ventilator dependent respiratory  failure  4. Protein calorie malnutrition  5. Debilitated  6 history of PEA arrest in May 2015  Plan  -resume TF  -continue wound care to PEG tube site, no signs of infection.  -the patient to remain in the hospital over the weekend.  Dr. Donne Hazel to discuss with family on Monday regarding potential surgery then proceed with surgery thereafter pending OR availability.  The family understands this.  Son is the POA, however, the patient lives with the niece and makes decisions regarding care along with the son.  We will need the son to be present during the consultation on Monday.  -further management per primary team, appreciate assistance    Erby Pian, [facility] Surgery Pager [phone] Office [phone]  06/20/2014

## 2014-06-20 NOTE — Progress Notes (Signed)
PULMONARY / CRITICAL CARE MEDICINE   Name: Katelyn Rojas MRN: [id number] DOB: 02/25/1941    ADMISSION DATE:  06/17/2014 CONSULTATION DATE:  06/17/14  REFERRING MD :  [name] >> ED  CHIEF COMPLAINT: ? Perforated viscus   INITIAL PRESENTATION: 73 y.o. F, [name] resident on chronic trach, brought to ED for concerns that PEG tube had perforated her colon. Per reports, PEG has been draining chronically, some of drainage thought to be stool. Surgeon at [name] was concerned that PEG may be transversing colon into stomach. Pt was brought to ED for evaluation of "perforated viscus"; however, upon [facility] ED, this was determined to not be the case. PCCM was called for admission. She will be admit under observation status as she is pending GI consult for colonoscopy.   STUDIES:  8/5 KUB >>> no free air, generalized bowel distention, stable gastrostomy.  8/6 >> No surgery needed, IR to evaluate the G-tube.  8/6 >> BE shows Gastrostomy tube traverses distal transverse segment. Some contrast noted to exit at skin entry site along G tube. No obstruction   SIGNIFICANT EVENTS:  8/5 admit >>> transferred from [name], concerns of perforated viscus, [facility] suggested this to not be the case.  8/7>> Surgery discussing with family about revision of PEG tube, Dr. Dwain [facility]ake[place] to meet with family on 8/10 to discuss surgical options. Plan to go to or 8/10 possibly.   HISTORY OF PRESENT ILLNESS:  Katelyn Rojas is a 73 y.o. F with multiple co-morbidities as outlined below and who resides at [name] nursing facility. [facility] for concerns that her PEG tube may have perforated her viscus. [facility] and had PEG placed by IR while she was there. Pt was then transferred to [name] 3 - 4 weeks ago. Since PEG was placed, it has been draining. Some of material draining thought to be stool. Surgeon at [facility] concerned for bowel perf, hence, [facility]  In  ED, KUB demonstrated no free air therefore low suspicion for bowel perf. Multiple conversations between PCCM and CCS regarding plan of care. Decision made to admit pt under observation status, GI will see pt tonight and decide if they will be able to perform colonoscopy to further assess whether PEG has in fact perforated bowel. If so, pt would require surgery.   SUBJECTIVE:  NSC, peg has odor similar to feces.   VITAL SIGNS: Temp:  [98.3 F (36.8 C)-99.1 F (37.3 C)] 98.3 F (36.8 C) (08/08 0828) Pulse Rate:  [56-94] 66 (08/08 1100) Resp:  [11-24] 11 (08/08 1100) BP: (90-147)/(40-94) 111/55 mmHg (08/08 1100) SpO2:  [95 %-100 %] 100 % (08/08 1100) FiO2 (%):  [35 %] 35 % (08/08 0813) Weight:  [93 lb 11.1 oz (42.5 kg)] 93 lb 11.1 oz (42.5 kg) (08/08 0400) HEMODYNAMICS:   VENTILATOR SETTINGS: Vent Mode:  [-] PRVC FiO2 (%):  [35 %] 35 % Set Rate:  [14 bmp] 14 bmp Vt Set:  [400 mL] 400 mL PEEP:  [5 cmH20] 5 cmH20 Plateau Pressure:  [17 cmH20-24 cmH20] 20 cmH20 INTAKE / OUTPUT:  Intake/Output Summary (Last 24 hours) at 06/20/14 1140 Last data filed at 06/20/14 1100  Gross per 24 hour  Intake   1800 ml  Output    735 ml  Net   1065 ml    PHYSICAL EXAMINATION: General: Chronically ill appearing female, cachectic, resting in bed, in NAD. Kyphotic Neuro: A&O x 3,  non-focal. Attempts to converse.  HEENT: Temporal wasting, PERRL. MM dry.  Cardiovascular: RRR, no M/R/G.  Lungs: Respirations even and unlabored on vent. Scattered wheezes bilaterally.  Abdomen: BS x 4, soft, NT/ND. PEG in place with dried drainage noted around button , smells like feces Musculoskeletal: Muscle wasting, no edema.  Skin: Warm, no rashes, sacral decub ulcer.   LABS:  CBC  Recent Labs Lab 06/17/14 1655 06/18/14 0618 06/19/14 1100  WBC 9.9 7.6 10.0  HGB 8.4* 8.1* 8.0*  HCT 27.9* 26.6* 26.5*  PLT 290 306 341   Coag's  Recent Labs Lab 06/17/14 1632 06/17/14 1655  APTT  --  28  INR 1.25  1.16   BMET  Recent Labs Lab 06/17/14 1655 06/18/14 0630 06/19/14 1100  NA 142 140 143  K 4.0 3.9 3.8  CL 97 97 101  CO2 34* 35* 34*  BUN 22 18 13   CREATININE 0.50 0.49* 0.47*  GLUCOSE 98 149* 119*   Electrolytes  Recent Labs Lab 06/17/14 1632 06/17/14 1655 06/18/14 0630 06/19/14 1100  CALCIUM 8.9 9.2 8.8 8.7  MG 1.6  --   --   --   PHOS 4.0  --   --   --    Sepsis Markers No results found for this basename: LATICACIDVEN, PROCALCITON, O2SATVEN,  in the last 168 hours ABG No results found for this basename: PHART, PCO2ART, PO2ART,  in the last 168 hours Liver Enzymes  Recent Labs Lab 06/17/14 1632 06/17/14 1655  AST 23 25  ALT 27 30  ALKPHOS 146* 172*  BILITOT 0.3 0.4  ALBUMIN 2.5* 3.1*   Cardiac Enzymes No results found for this basename: TROPONINI, PROBNP,  in the last 168 hours Glucose  Recent Labs Lab 06/19/14 1225 06/19/14 1621 06/19/14 2007 06/19/14 2328 06/20/14 0403 06/20/14 0825  GLUCAP 104* 114* 115* 121* 114* 98    Imaging No results found.   ASSESSMENT / PLAN:  PULMONARY  Trach 04/01/14  A:  Chronic respiratory failure  COPD  Tracheostomy Status  P:  Full vent support.  Wean as able.  Albuterol PRN.   CARDIOVASCULAR  A:  Chronic Hypotension - per son, pt's SBP usually in 80's.  P:  Stress steroids. 50 mg q6hrs Hold outpatient lopressor.   RENAL  A:  No acute issues.  P:  BMP in AM.   GASTROINTESTINAL  A:  PEG malfunctioning , misplaced  Bowel perforation  Severe protein calorie malnutrition   P:  Pantoprazole.  GI consulted- not a candidate for colonoscopy Seen by surgery - Dr. Dwain Sarna to discuss surgical options with Family on 8/10 Keep npo till surgery , could consider TNA in future.  HEMATOLOGIC  A:  Chronic Anemia  P:  VTE prophylaxis: Heparin.  Transfuse for Hgb < 7.  Check coags.  CBC in AM.   INFECTIOUS  A:  Hx MRSA and enterococcus bacteremia Hx Tracheitis (cultures + for Alcaligenes  bacteria)  Sacra decubitus ulcer  P:  Abx: Vanc, Cipro, Flagyl (from [facility]).  Monitor fever curve / WBC's.  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  P:  SSI.  Stress steroids.   NEUROLOGIC  A:  Dementia  Anxiety  P:  RASS goal: 0.  Fentanyl, Ativan PRN.  Hold outpatient Celexa, Clonopin, Aricept.  Monitor.     TODAY'S SUMMARY:73 y.o. F from [name] on chronic trach, brought to [facility] her PEG tube has perforated her transverse colon. She  is for surgery 8/10. NPO till then.   Brett Canales Minor ACNP [facility] Pager [phone] till 3 pm If no answer page [phone] 06/20/2014, 11:47 AM   Reviewed above, examined, and agree.    In holding pattern until surgery ready to procedure with correction of misplaced PEG tube.  Coralyn Helling, MD [facility] Pulmonary/Critical Care 06/20/2014, 1:13 PM Pager:  [phone] After 3pm call: [phone]

## 2014-06-20 NOTE — Progress Notes (Signed)
Agree with above, I will plan on surgery this week if family agreeable.  i will need to speak with them prior though as she has significant risk of mortality. Will see on Monday

## 2014-06-20 NOTE — Progress Notes (Signed)
Echo ok. Appreciate cards evaluation.  Discussed with NP & PA as well as their conversation with niece and other family members today Chart reviewed  No decision about surgery by family by this pm. Had PA explain to them that I would not recommend starting a potentially life threatening nonemergent surgery after 4pm and surgery wouldn't take place over the weekend. Earliest would be Tuesday.   Dr Dwain Sarna is our surgicalist next for [facility].  Relayed to CCM- NP: -Dr Dwain Sarna will not operate on pt on Monday.  -Requests a family meeting with all interested parties and [facility] POA at least 24 hrs before surgery so that he can discuss risks of surgery with them. Given her significant co-morbidities, she is at very high risk for significant complications including death and the operative surgeon always need to convey this to the family even if already done so. He isn't available to hold meeting until Monday -appreciate CCM assistance in this pt's care   Mary Sella. Andrey Campanile, MD, FACS General, Bariatric, & Minimally Invasive Surgery [facility], [place]

## 2014-06-20 NOTE — Progress Notes (Signed)
Needed clarification that tube feeding is on hold per MD note, spoke with Dr. Sherene Sires and he confirmed this.  Tube feeding on hold

## 2014-06-21 ENCOUNTER — Inpatient Hospital Stay (HOSPITAL_COMMUNITY): Payer: Medicare Other

## 2014-06-21 DIAGNOSIS — F0390 Unspecified dementia without behavioral disturbance: Secondary | ICD-10-CM

## 2014-06-21 LAB — GLUCOSE, CAPILLARY
GLUCOSE-CAPILLARY: 133 mg/dL — AB (ref 70–99)
GLUCOSE-CAPILLARY: 160 mg/dL — AB (ref 70–99)
Glucose-Capillary: 151 mg/dL — ABNORMAL HIGH (ref 70–99)
Glucose-Capillary: 202 mg/dL — ABNORMAL HIGH (ref 70–99)
Glucose-Capillary: 160 mg/dL — ABNORMAL HIGH (ref 70–99)
Glucose-Capillary: 146 mg/dL — ABNORMAL HIGH (ref 70–99)

## 2014-06-21 LAB — BASIC METABOLIC PANEL
Anion gap: 10 (ref 5–15)
BUN: 9 mg/dL (ref 6–23)
CO2: 31 meq/L (ref 19–32)
CREATININE: 0.46 mg/dL — AB (ref 0.50–1.10)
Calcium: 8.2 mg/dL — ABNORMAL LOW (ref 8.4–10.5)
Chloride: 100 mEq/L (ref 96–112)
GFR calc Af Amer: >90 mL/min (ref >90)
GFR calc non Af Amer: >90 mL/min (ref >90)
Glucose, Bld: 139 mg/dL — ABNORMAL HIGH (ref 70–99)
Potassium: 3.1 mEq/L — ABNORMAL LOW (ref 3.7–5.3)
Sodium: 141 mEq/L (ref 137–147)

## 2014-06-21 LAB — PROTIME-INR
INR: 2.5 — ABNORMAL HIGH (ref 0.00–1.49)
Prothrombin Time: 27 seconds — ABNORMAL HIGH (ref 11.6–15.2)

## 2014-06-21 LAB — CBC
HEMATOCRIT: 24.3 % — AB (ref 36.0–46.0)
Hemoglobin: 7.5 g/dL — ABNORMAL LOW (ref 12.0–15.0)
MCH: 31 pg (ref 26.0–34.0)
MCHC: 30.9 g/dL (ref 30.0–36.0)
MCV: 100.4 fL — AB (ref 78.0–100.0)
PLATELETS: 300 10*3/uL (ref 150–400)
RBC: 2.42 MIL/uL — ABNORMAL LOW (ref 3.87–5.11)
RDW: 15.5 % (ref 11.5–15.5)
WBC: 9.4 10*3/uL (ref 4.0–10.5)

## 2014-06-21 LAB — VANCOMYCIN, TROUGH: VANCOMYCIN TR: 19.9 ug/mL (ref 10.0–20.0)

## 2014-06-21 LAB — APTT: aPTT: >200 seconds (ref 24–37)

## 2014-06-21 MED ORDER — MUPIROCIN 2 % EX OINT
1.0000 "application " | TOPICAL_OINTMENT | Freq: Two times a day (BID) | CUTANEOUS | Status: AC
  Administered 2014-06-21 – 2014-06-25 (×10): 1 via NASAL
  Filled 2014-06-21: qty 22

## 2014-06-21 MED ORDER — CHLORHEXIDINE GLUCONATE CLOTH 2 % EX PADS
6.0000 | MEDICATED_PAD | Freq: Every day | CUTANEOUS | Status: AC
  Administered 2014-06-21 – 2014-06-25 (×5): 6 via TOPICAL

## 2014-06-21 MED ORDER — POTASSIUM CHLORIDE 10 MEQ/100ML IV SOLN
10.0000 meq | INTRAVENOUS | Status: AC
  Administered 2014-06-21 (×4): 10 meq via INTRAVENOUS
  Filled 2014-06-21 (×4): qty 100

## 2014-06-21 NOTE — Progress Notes (Addendum)
PULMONARY / CRITICAL CARE MEDICINE   Name: Katelyn Rojas MRN: [id number] DOB: 06-11-1941    ADMISSION DATE:  06/17/2014 CONSULTATION DATE:  06/17/14  REFERRING MD :  [name] >> ED  CHIEF COMPLAINT: ? Perforated viscus   INITIAL PRESENTATION: 73 y.o. F, [name] resident on chronic trach, brought to ED for concerns that PEG tube had perforated her colon. Per reports, PEG has been draining chronically, some of drainage thought to be stool. Surgeon at [name] was concerned that PEG may be transversing colon into stomach. Pt was brought to ED for evaluation of "perforated viscus"; however, upon arrival to [facility] ED, this was determined to not be the case. PCCM was called for admission.   STUDIES:  8/5 KUB >>> no free air, generalized bowel distention, stable gastrostomy.  8/6 >> No surgery needed, IR to evaluate the G-tube.  8/6 >> BE shows Gastrostomy tube traverses distal transverse segment. Some contrast noted to exit at skin entry site along G tube. No obstruction   SIGNIFICANT EVENTS:  8/5 admit >>> transferred from [name], concerns of perforated viscus, imaging at [facility] ED suggested this to not be the case.  8/7>> Surgery discussing with family about revision of PEG tube, Dr. Dwain Sarna to meet with family on 8/10 to discuss surgical options. Plan to go to or 8/10 possibly.   HISTORY OF PRESENT ILLNESS:  Mrs. Passey is a 73 y.o. F with multiple co-morbidities as outlined below and who resides at [name] nursing facility. She was brought to the [facility] ED on 8/5 for concerns that her PEG tube may have perforated her viscus. She was at [facility] 3 - 4 months ago and had PEG placed by IR while she was there. Pt was then transferred to [name] 3 - 4 weeks ago. Since PEG was placed, it has been draining. Some of material draining thought to be stool. Surgeon at [facility] concerned for bowel perf, hence, transfer to [facility].  In ED, KUB demonstrated no free air therefore low suspicion for bowel perf. Multiple  conversations between PCCM and CCS regarding plan of care. Decision made to admit pt under observation status, GI will see pt tonight and decide if they will be able to perform colonoscopy to further assess whether PEG has in fact perforated bowel. If so, pt would require surgery.   SUBJECTIVE:  Responds to name by nodding, no new concerns per nursing   VITAL SIGNS: Temp:  [96.8 F (36 C)-98.9 F (37.2 C)] 98.9 F (37.2 C) (08/09 1232) Pulse Rate:  [59-113] 102 (08/09 1232) Resp:  [11-26] 23 (08/09 1232) BP: (104-129)/(50-68) 122/54 mmHg (08/09 1232) SpO2:  [94 %-100 %] 95 % (08/09 1232) FiO2 (%):  [35 %] 35 % (08/09 1232) Weight:  [126 lb 5.2 oz (57.3 kg)] 126 lb 5.2 oz (57.3 kg) (08/09 0500) HEMODYNAMICS:   VENTILATOR SETTINGS: Vent Mode:  [-] PRVC FiO2 (%):  [35 %] 35 % Set Rate:  [14 bmp] 14 bmp Vt Set:  [400 mL] 400 mL PEEP:  [5 cmH20] 5 cmH20 Plateau Pressure:  [13 cmH20-30 cmH20] 15 cmH20 INTAKE / OUTPUT:  Intake/Output Summary (Last 24 hours) at 06/21/14 1422 Last data filed at 06/21/14 1400  Gross per 24 hour  Intake   2290 ml  Output   2275 ml  Net     15 ml    PHYSICAL EXAMINATION: General: Chronically ill appearing female, cachectic, resting in bed, in NAD. Kyphotic Neuro: Attempts to converse. / appears alert  HEENT: Temporal wasting,   MM dry.  Cardiovascular: RRR, no M/R/G.  Lungs: Respirations even and unlabored on vent. Scattered wheezes bilaterally.  Abdomen: BS x 4, soft, NT/ND. PEG in place with dried drainage noted around button , smells like feces Musculoskeletal: Muscle wasting, no edema.  Skin: Warm, no rashes, sacral decub ulcer.   LABS:  CBC  Recent Labs Lab 06/18/14 0618 06/19/14 1100 06/21/14 0119  WBC 7.6 10.0 9.4  HGB 8.1* 8.0* 7.5*  HCT 26.6* 26.5* 24.3*  PLT 306 341 300   Coag's  Recent Labs Lab 06/17/14 1632 06/17/14 1655 06/21/14 0119  APTT  --  28 >200*  INR 1.25 1.16 2.50*   BMET  Recent Labs Lab  06/18/14 0630 06/19/14 1100 06/21/14 0119  NA 140 143 141  K 3.9 3.8 3.1*  CL 97 101 100  CO2 35* 34* 31  BUN 18 13 9   CREATININE 0.49* 0.47* 0.46*  GLUCOSE 149* 119* 139*   Electrolytes  Recent Labs Lab 06/17/14 1632  06/18/14 0630 06/19/14 1100 06/21/14 0119  CALCIUM 8.9  < > 8.8 8.7 8.2*  MG 1.6  --   --   --   --   PHOS 4.0  --   --   --   --   < > = values in this interval not displayed. Sepsis Markers No results found for this basename: LATICACIDVEN, PROCALCITON, O2SATVEN,  in the last 168 hours ABG No results found for this basename: PHART, PCO2ART, PO2ART,  in the last 168 hours Liver Enzymes  Recent Labs Lab 06/17/14 1632 06/17/14 1655  AST 23 25  ALT 27 30  ALKPHOS 146* 172*  BILITOT 0.3 0.4  ALBUMIN 2.5* 3.1*   Cardiac Enzymes No results found for this basename: TROPONINI, PROBNP,  in the last 168 hours Glucose  Recent Labs Lab 06/20/14 1602 06/20/14 1954 06/21/14 0003 06/21/14 0421 06/21/14 0821 06/21/14 1229  GLUCAP 102* 131* 160* 151* 202* 160*    Imaging No results found.   ASSESSMENT / PLAN:  PULMONARY  Trach 04/01/14  A:  Chronic respiratory failure  COPD  Tracheostomy Status  P:  Full vent support.  Wean as able.  Albuterol PRN.   CARDIOVASCULAR  A:  Chronic Hypotension - per son, pt's SBP usually in 80's.  P:  Stress steroids. 50 mg q6hrs Hold outpatient lopressor.   RENAL  A: hypokalemia P:  Replace electrolytes as needed  GASTROINTESTINAL  A:  PEG malfunctioning , misplaced  Bowel perforation  Severe protein calorie malnutrition   P:  Pantoprazole.  GI consulted- not a candidate for colonoscopy Seen by surgery - Dr. Dwain SarnaWakeField to discuss surgical options with Family on 8/10    HEMATOLOGIC  A:  Chronic Anemia  Coagulopathy on [place] Hep P:  VTE prophylaxis: d/c Heparin 8/9 / PAS hose Transfuse for Hgb < 7.   .   INFECTIOUS  A:  Hx MRSA and enterococcus bacteremia Hx Tracheitis (cultures + for  Alcaligenes bacteria)  Sacra decubitus ulcer  P:  Abx: Vanc, Cipro, Flagyl (from [facility]).  Monitor fever curve / WBC's.  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  P:  SSI.  Stress steroids.   NEUROLOGIC  A:  Dementia  Anxiety  P:   Fentanyl, Ativan PRN.  Hold outpatient Celexa, Clonopin, Aricept.  Monitor.     TODAY'S SUMMARY:73 y.o. F from [name] on [facility] trach[facility] her transverse colon. She is  for surgery week of  8/10. All peg and TF issues per CCS   Sandrea Hughs, MD Pulmonary and Critical Care Medicine [facility] Cell [phone] After 5:30 PM or weekends, call [phone]

## 2014-06-21 NOTE — Progress Notes (Signed)
Brief Nutrition Note  Consult received for enteral/tube feeding initiation and management.  Adult Enteral Nutrition Protocol initiated by MD. Full assessment to follow.  Per documentation, TFs currently on hold for verification of PEG placement.  Awaiting GI consult. APTT >200.  Admitting Dx: Tracheostomy status [V44.0] Gastrostomy complication [536.40] Protein-calorie malnutrition, severe [262] Chronic respiratory failure with hypoxia [518.83, 799.02] Sacral decubitus ulcer, stage III [707.03, 707.23] Pulmonary emphysema, unspecified emphysema type [492.8]  Body mass index is 23.85 kg/(m^2). Pt meets criteria for WNL based on current BMI.  Labs:   Recent Labs Lab 06/17/14 1632  06/18/14 0630 06/19/14 1100 06/21/14 0119  NA 139  < > 140 143 141  K 4.2  < > 3.9 3.8 3.1*  CL 96  < > 97 101 100  CO2 30  < > 35* 34* 31  BUN 23  < > 18 13 9   CREATININE 0.50  < > 0.49* 0.47* 0.46*  CALCIUM 8.9  < > 8.8 8.7 8.2*  MG 1.6  --   --   --   --   PHOS 4.0  --   --   --   --   GLUCOSE 71  < > 149* 119* 139*  < > = values in this interval not displayed.  Loyce Dys, MS RD LDN Clinical Inpatient Dietitian Weekend/After hours pager: [phone]

## 2014-06-21 NOTE — Progress Notes (Signed)
Pt was desating after giving bath to low 70's -60's,suctioning done still on low 70's , RT at bedside bagging pt,sats at 98-100% now,HR at 80's.Will continue to monitor.

## 2014-06-21 NOTE — Progress Notes (Signed)
CRITICAL VALUE ALERT  Critical value received:  APTT>200  Date of notification:  06/21/14  Time of notification: 0735  Critical value read back:yes Nurse who received alert:  Dyke Brackett  MD notified (1st page):  Dr.Sood  Time of first page:  0739  MD notified (2nd page):  Time of second page:  Responding MD: Dr.Sood  Time MD responded: [phone]

## 2014-06-21 NOTE — Progress Notes (Signed)
Critical value (aPTT > 200). Pt's nurse ANA, RN is notified.

## 2014-06-21 NOTE — Progress Notes (Signed)
ANTIBIOTIC CONSULT NOTE - FOLLOW UP  Pharmacy Consult for Vancomycin  Indication: Sacral cellulitis/empiric abdominal coverage  Intake/Output from previous day: 08/08 0701 - 08/09 0700 In: 2115 [I.V.:1425; NG/GT:40; IV Piggyback:650] Out: 1635 [Urine:1635] Intake/Output from this shift: Total I/O In: 1225 [I.V.:825; IV Piggyback:400] Out: 1500 [Urine:1500]  Labs:  Recent Labs  06/18/14 0618 06/18/14 0630 06/19/14 1100 06/21/14 0119  WBC 7.6  --  10.0 9.4  HGB 8.1*  --  8.0* 7.5*  PLT 306  --  341 300  CREATININE  --  0.49* 0.47* 0.46*   Recent Labs  06/21/14 0525  VANCOTROUGH 19.9    Assessment: Therapeutic vancomycin trough of 19.9, renal function remains stable, WBC WNL, afebrile  Goal of Therapy:  Vancomycin trough level 15-20 mcg/ml  Plan:  -Continue vancomycin 1000 mg IV q24h -Repeat VT as needed  Abran Duke 06/21/2014,6:16 AM

## 2014-06-22 DIAGNOSIS — R933 Abnormal findings on diagnostic imaging of other parts of digestive tract: Secondary | ICD-10-CM

## 2014-06-22 DIAGNOSIS — J411 Mucopurulent chronic bronchitis: Secondary | ICD-10-CM

## 2014-06-22 LAB — PROTIME-INR
INR: 1.32 (ref 0.00–1.49)
PROTHROMBIN TIME: 16.4 s — AB (ref 11.6–15.2)

## 2014-06-22 LAB — GLUCOSE, CAPILLARY
GLUCOSE-CAPILLARY: 121 mg/dL — AB (ref 70–99)
GLUCOSE-CAPILLARY: 182 mg/dL — AB (ref 70–99)
GLUCOSE-CAPILLARY: 197 mg/dL — AB (ref 70–99)
Glucose-Capillary: 142 mg/dL — ABNORMAL HIGH (ref 70–99)
Glucose-Capillary: 177 mg/dL — ABNORMAL HIGH (ref 70–99)
Glucose-Capillary: 199 mg/dL — ABNORMAL HIGH (ref 70–99)

## 2014-06-22 LAB — BASIC METABOLIC PANEL
Anion gap: 10 (ref 5–15)
BUN: 7 mg/dL (ref 6–23)
CO2: 29 mEq/L (ref 19–32)
Calcium: 8.3 mg/dL — ABNORMAL LOW (ref 8.4–10.5)
Chloride: 102 mEq/L (ref 96–112)
Creatinine, Ser: 0.44 mg/dL — ABNORMAL LOW (ref 0.50–1.10)
GFR calc Af Amer: >90 mL/min (ref >90)
GFR calc non Af Amer: >90 mL/min (ref >90)
Glucose, Bld: 142 mg/dL — ABNORMAL HIGH (ref 70–99)
Potassium: 3.4 mEq/L — ABNORMAL LOW (ref 3.7–5.3)
Sodium: 141 mEq/L (ref 137–147)

## 2014-06-22 LAB — TYPE AND SCREEN
ABO/RH(D): A POS
Antibody Screen: NEGATIVE

## 2014-06-22 LAB — HEPATIC FUNCTION PANEL
ALK PHOS: 104 U/L (ref 39–117)
ALT: 16 U/L (ref 0–35)
AST: 15 U/L (ref 0–37)
Albumin: 2.6 g/dL — ABNORMAL LOW (ref 3.5–5.2)
TOTAL PROTEIN: 5.7 g/dL — AB (ref 6.0–8.3)
Total Bilirubin: 0.2 mg/dL — ABNORMAL LOW (ref 0.3–1.2)

## 2014-06-22 LAB — TRIGLYCERIDES: Triglycerides: 62 mg/dL (ref <150)

## 2014-06-22 MED ORDER — MORPHINE SULFATE 2 MG/ML IJ SOLN
2.0000 mg | INTRAMUSCULAR | Status: DC | PRN
  Administered 2014-06-22 – 2014-06-26 (×17): 2 mg via INTRAVENOUS
  Filled 2014-06-22 (×17): qty 1

## 2014-06-22 MED ORDER — INSULIN ASPART 100 UNIT/ML ~~LOC~~ SOLN
0.0000 [IU] | SUBCUTANEOUS | Status: DC
  Administered 2014-06-22: 1 [IU] via SUBCUTANEOUS
  Administered 2014-06-22: 2 [IU] via SUBCUTANEOUS
  Administered 2014-06-23: 1 [IU] via SUBCUTANEOUS
  Administered 2014-06-23: 3 [IU] via SUBCUTANEOUS
  Administered 2014-06-23 (×3): 2 [IU] via SUBCUTANEOUS
  Administered 2014-06-24 (×2): 3 [IU] via SUBCUTANEOUS
  Administered 2014-06-24: 1 [IU] via SUBCUTANEOUS
  Administered 2014-06-24: 2 [IU] via SUBCUTANEOUS
  Administered 2014-06-24 – 2014-06-25 (×2): 1 [IU] via SUBCUTANEOUS
  Administered 2014-06-25 (×3): 2 [IU] via SUBCUTANEOUS
  Administered 2014-06-25: 13:00:00 via SUBCUTANEOUS
  Administered 2014-06-25: 2 [IU] via SUBCUTANEOUS
  Administered 2014-06-26: 1 [IU] via SUBCUTANEOUS
  Administered 2014-06-26: 5 [IU] via SUBCUTANEOUS
  Administered 2014-06-26 (×2): 2 [IU] via SUBCUTANEOUS
  Administered 2014-06-26 – 2014-06-27 (×5): 1 [IU] via SUBCUTANEOUS
  Administered 2014-06-27: 2 [IU] via SUBCUTANEOUS
  Administered 2014-06-28 (×3): 1 [IU] via SUBCUTANEOUS
  Administered 2014-06-29: 2 [IU] via SUBCUTANEOUS
  Administered 2014-06-29 (×2): 1 [IU] via SUBCUTANEOUS
  Administered 2014-06-29 – 2014-06-30 (×4): 2 [IU] via SUBCUTANEOUS

## 2014-06-22 MED ORDER — FAT EMULSION 20 % IV EMUL
250.0000 mL | INTRAVENOUS | Status: AC
  Administered 2014-06-22: 250 mL via INTRAVENOUS
  Filled 2014-06-22: qty 250

## 2014-06-22 MED ORDER — TRACE MINERALS CR-CU-F-FE-I-MN-MO-SE-ZN IV SOLN
INTRAVENOUS | Status: AC
  Administered 2014-06-22: 18:00:00 via INTRAVENOUS
  Filled 2014-06-22: qty 2000

## 2014-06-22 MED ORDER — FAT EMULSION 20 % IV EMUL
250.0000 mL | INTRAVENOUS | Status: DC
  Administered 2014-06-22: 250 mL via INTRAVENOUS
  Filled 2014-06-22 (×4): qty 250

## 2014-06-22 MED ORDER — SODIUM CHLORIDE 0.45 % IV SOLN
INTRAVENOUS | Status: AC
  Administered 2014-06-22: 18:00:00 via INTRAVENOUS
  Filled 2014-06-22 (×2): qty 1000

## 2014-06-22 MED ORDER — TRACE MINERALS CR-CU-F-FE-I-MN-MO-SE-ZN IV SOLN
INTRAVENOUS | Status: DC
  Administered 2014-06-22: 17:00:00 via INTRAVENOUS
  Filled 2014-06-22: qty 2000

## 2014-06-22 NOTE — Progress Notes (Signed)
PULMONARY / CRITICAL CARE MEDICINE   Name: Katelyn Rojas MRN: [id number] DOB: Jun 23, 1941    ADMISSION DATE:  06/17/2014 CONSULTATION DATE:  06/17/14  REFERRING MD :  [name] >> ED  CHIEF COMPLAINT: ? Perforated viscus   INITIAL PRESENTATION: 73 y.o. F, [name] resident on chronic trach, brought to ED for concerns that PEG tube had perforated her colon. Per reports, PEG has been draining chronically, some of drainage thought to be stool. Surgeon at [name] was concerned that PEG may be transversing colon into stomach. Pt was brought to ED for evaluation of "perforated viscus"; however, upon arrival to [facility] ED, this was determined to not be the case. PCCM was called for admission.   STUDIES:  8/5 KUB >>> no free air, generalized bowel distention, stable gastrostomy.  8/6 >> No surgery needed, IR to evaluate the G-tube.  8/6 >> BE shows Gastrostomy tube traverses distal transverse segment. Some contrast noted to exit at skin entry site along G tube. No obstruction   SIGNIFICANT EVENTS:  8/5 admit >>> transferred from [name], concerns of perforated viscus, imaging at [facility] ED suggested this to not be the case.  8/7>> Surgery discussing with family about revision of PEG tube, Dr. Dwain Sarna to meet with family on 8/10 to discuss surgical options. Plan to go to or 8/10 possibly.  8/10 TPN started. Surgery planning on to OR soon.   SUBJECTIVE:  Awake and alert. NAD.  VITAL SIGNS: Temp:  [97.5 F (36.4 C)-98.9 F (37.2 C)] 98.9 F (37.2 C) (08/10 1252) Pulse Rate:  [52-92] 61 (08/10 1252) Resp:  [14-37] 14 (08/10 1252) BP: (101-121)/(48-75) 121/55 mmHg (08/10 1252) SpO2:  [96 %-100 %] 98 % (08/10 1252) FiO2 (%):  [35 %-100 %] 35 % (08/10 1252) Weight:  [129 lb 13.6 oz (58.9 kg)] 129 lb 13.6 oz (58.9 kg) (08/10 0600) HEMODYNAMICS:   VENTILATOR SETTINGS: Vent Mode:  [-] PRVC FiO2 (%):  [35 %-100 %] 35 % Set Rate:  [14 bmp] 14 bmp Vt Set:  [400 mL] 400 mL PEEP:  [5 cmH20] 5  cmH20 Plateau Pressure:  [15 cmH20-21 cmH20] 15 cmH20 INTAKE / OUTPUT:  Intake/Output Summary (Last 24 hours) at 06/22/14 1329 Last data filed at 06/22/14 1254  Gross per 24 hour  Intake   1175 ml  Output    425 ml  Net    750 ml    PHYSICAL EXAMINATION: General: Chronically ill appearing female, cachectic, resting in bed, in NAD. Kyphotic. Nods to questions. Neuro: Attempts to converse. / appears alert  HEENT: Temporal wasting,   MM dry.  Cardiovascular: RRR, no M/R/G.  Lungs: Respirations even and unlabored on vent. Diminished bs bases.  Abdomen: BS x 4, soft, NT/ND. PEG in place with dried drainage noted around button , smells like feces Musculoskeletal: Muscle wasting, no edema.  Skin: Warm, no rashes, sacral decub ulcer.   LABS:  CBC  Recent Labs Lab 06/18/14 0618 06/19/14 1100 06/21/14 0119  WBC 7.6 10.0 9.4  HGB 8.1* 8.0* 7.5*  HCT 26.6* 26.5* 24.3*  PLT 306 341 300   Coag's  Recent Labs Lab 06/17/14 1655 06/21/14 0119 06/22/14 0812  APTT 28 >200*  --   INR 1.16 2.50* 1.32   BMET  Recent Labs Lab 06/18/14 0630 06/19/14 1100 06/21/14 0119  NA 140 143 141  K 3.9 3.8 3.1*  CL 97 101 100  CO2 35* 34* 31  BUN 18 13 9   CREATININE 0.49* 0.47* 0.46*  GLUCOSE 149* 119* 139*  Electrolytes  Recent Labs Lab 06/17/14 1632  06/18/14 0630 06/19/14 1100 06/21/14 0119  CALCIUM 8.9  < > 8.8 8.7 8.2*  MG 1.6  --   --   --   --   PHOS 4.0  --   --   --   --   < > = values in this interval not displayed. Sepsis Markers No results found for this basename: LATICACIDVEN, PROCALCITON, O2SATVEN,  in the last 168 hours ABG No results found for this basename: PHART, PCO2ART, PO2ART,  in the last 168 hours Liver Enzymes  Recent Labs Lab 06/17/14 1632 06/17/14 1655 06/22/14 0812  AST 23 25 15   ALT 27 30 16   ALKPHOS 146* 172* 104  BILITOT 0.3 0.4 0.2*  ALBUMIN 2.5* 3.1* 2.6*   Cardiac Enzymes No results found for this basename: TROPONINI, PROBNP,   in the last 168 hours Glucose  Recent Labs Lab 06/21/14 1606 06/21/14 1941 06/22/14 0006 06/22/14 0504 06/22/14 0808 06/22/14 1250  GLUCAP 146* 133* 199* 121* 182* 177*    Imaging Dg Chest Port 1 View  06/21/2014   CLINICAL DATA:  Tracheostomy.  EXAM: PORTABLE CHEST - 1 VIEW  COMPARISON:  06/17/2014  FINDINGS: The cardiomediastinal silhouette is stable.  A tracheostomy to is unchanged with tip 8.8 cm above the carina.  Bibasilar atelectasis and small bilateral pleural effusions noted.  A right PICC line is present with tip overlying the lower SVC.  There is no evidence of pneumothorax.  IMPRESSION: Slight increase in bibasilar atelectasis with continued small bilateral pleural effusions.  No other significant change.   Electronically Signed   By: Laveda Abbe M.D.   On: 06/21/2014 08:05     ASSESSMENT / PLAN:  PULMONARY  Trach 04/01/14  A:  Chronic respiratory failure  COPD  Tracheostomy Status  P:  Full vent support.  Wean as able.  Albuterol PRN.   CARDIOVASCULAR  A:  Chronic Hypotension - per son, pt's SBP usually in 80's.  P:  Stress steroids. 50 mg q6hrs Hold outpatient lopressor.   RENAL  A: hypokalemia P:  Replace electrolytes as needed  GASTROINTESTINAL  A:  PEG malfunctioning , misplaced  Bowel perforation  Severe protein calorie malnutrition   P:  Pantoprazole.  GI consulted- not a candidate for colonoscopy Seen by surgery - Dr. Dwain Sarna to discuss surgical options with Family on 8/10. Placed on TPN    HEMATOLOGIC  A:  Chronic Anemia  Coagulopathy on [place] Hep P:  VTE prophylaxis: d/c Heparin 8/9 / PAS hose Transfuse for Hgb < 7.   .   INFECTIOUS  A:  Hx MRSA and enterococcus bacteremia Hx Tracheitis (cultures + for Alcaligenes bacteria)  Sacra decubitus ulcer  P:  Abx: Vanc, Cipro, Flagyl (from [facility]).  Monitor fever curve / WBC's.  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  P:   SSI.  Stress steroids.   NEUROLOGIC  A:  Dementia  Anxiety  P:   Fentanyl, Ativan PRN.  Hold outpatient Celexa, Clonopin, Aricept.  Monitor.     TODAY'S SUMMARY:73 y.o. F from [name] on chronic trach, brought to [facility] her PEG tube has perforated her transverse colon. She is for surgery week of  8/10. All peg and TF issues per CCS.   Brett Canales Minor ACNP [facility] P[facility]M Pager [phone] till 3 pm If no answer page [phone] 06/22/2014, 1:33 PM  Attending:  I have seen and examined  the patient with nurse practitioner/resident and agree with the note above.   Heber CarolinaBrent McQuaid, MD [place] [facility] Pager: [phone] Cell: [phone] If no response, call [phone]

## 2014-06-22 NOTE — Progress Notes (Signed)
Patient ID: Katelyn BruinDelsenia Rojas, female   DOB: 08/16/1941, 73 y.o.   MRN: [id number]    Subjective: Patient lying comfortably in bed. The patient just began to brady down this morning. She is maintaining in the 50s right now. She did also desat some yesterday on the vent while being manipulated, such as just turning her to give her a bath.  She also was noted to have an elevated PT INR, PTT. She is not on Coumadin. Her heparin was stopped.  Objective: Vital signs in last 24 hours: Temp:  [97.8 F (36.6 C)-98.9 F (37.2 C)] 97.9 F (36.6 C) (08/10 0448) Pulse Rate:  [56-113] 59 (08/10 0600) Resp:  [14-37] 15 (08/10 0600) BP: (101-124)/(48-75) 112/57 mmHg (08/10 0600) SpO2:  [94 %-100 %] 100 % (08/10 0600) FiO2 (%):  [35 %-100 %] 35 % (08/10 0800) Weight:  [129 lb 13.6 oz (58.9 kg)] 129 lb 13.6 oz (58.9 kg) (08/10 0600) Last BM Date: 06/18/14  Intake/Output from previous day: 08/09 0701 - 08/10 0700 In: 1625 [I.V.:825; IV Piggyback:800] Out: 1050 [Urine:1050] Intake/Output this shift:    PE: Abd: Soft, minimally tender, some distention, PEG tube in place with no leakage right now. Heart: Sinus bradycardia  Lab Results:   Recent Labs  06/19/14 1100 06/21/14 0119  WBC 10.0 9.4  HGB 8.0* 7.5*  HCT 26.5* 24.3*  PLT 341 300   BMET  Recent Labs  06/19/14 1100 06/21/14 0119  NA 143 141  K 3.8 3.1*  CL 101 100  CO2 34* 31  GLUCOSE 119* 139*  BUN 13 9  CREATININE 0.47* 0.46*  CALCIUM 8.7 8.2*   PT/INR  Recent Labs  06/21/14 0119  LABPROT 27.0*  INR 2.50*   CMP     Component Value Date/Time   NA 141 06/21/2014 0119   K 3.1* 06/21/2014 0119   CL 100 06/21/2014 0119   CO2 31 06/21/2014 0119   GLUCOSE 139* 06/21/2014 0119   BUN 9 06/21/2014 0119   CREATININE 0.46* 06/21/2014 0119   CALCIUM 8.2* 06/21/2014 0119   PROT 6.9 06/17/2014 1655   ALBUMIN 3.1* 06/17/2014 1655   AST 25 06/17/2014 1655   ALT 30 06/17/2014 1655   ALKPHOS 172* 06/17/2014 1655   BILITOT 0.4 06/17/2014 1655    GFRNONAA >90 06/21/2014 0119   GFRAA >90 06/21/2014 0119   Lipase  No results found for this basename: lipase       Studies/Results: Dg Chest Port 1 View  06/21/2014   CLINICAL DATA:  Tracheostomy.  EXAM: PORTABLE CHEST - 1 VIEW  COMPARISON:  06/17/2014  FINDINGS: The cardiomediastinal silhouette is stable.  A tracheostomy to is unchanged with tip 8.8 cm above the carina.  Bibasilar atelectasis and small bilateral pleural effusions noted.  A right PICC line is present with tip overlying the lower SVC.  There is no evidence of pneumothorax.  IMPRESSION: Slight increase in bibasilar atelectasis with continued small bilateral pleural effusions.  No other significant change.   Electronically Signed   By: Laveda AbbeJeff  Hu M.D.   On: 06/21/2014 08:05    Anti-infectives: Anti-infectives   Start     Dose/Rate Route Frequency Ordered Stop   06/18/14 0600  vancomycin (VANCOCIN) IVPB 1000 mg/200 mL premix     1,000 mg 200 mL/hr over 60 Minutes Intravenous Every 24 hours 06/17/14 2234     06/17/14 2100  ciprofloxacin (CIPRO) IVPB 400 mg     400 mg 200 mL/hr over 60 Minutes Intravenous Every 12 hours 06/17/14  1816     06/17/14 1700  metroNIDAZOLE (FLAGYL) IVPB 500 mg     500 mg 100 mL/hr over 60 Minutes Intravenous Every 8 hours 06/17/14 1658         Assessment/Plan  1. Transcolonic PEG tube with feculent drainage around PEG tube 2. Chronic ventilator dependent respiratory failure, on chronic steroids 3. Anticoagulated, unknown etiology 4. New onset bradycardia  5. Multiple medical problems  Plan: 1. the patient is noted to have several new problems today. It is unclear what her new onset bradycardia is coming from. We'll defer this to critical care medicine. 2. I have ordered liver function tests as well as a repeat INR to further evaluate her anticoagulation. 3. I spoke to the niece this morning. The son and the niece will come this morning so we can discuss further care with them.  LOS: 5 days     Konnor Vondrasek E 06/22/2014, 8:09 AM Pager: [phone]

## 2014-06-22 NOTE — Progress Notes (Signed)
Patient ID: Katelyn Rojas, female   DOB: [birth year], 73 y.o.   MRN: [id number] We had long conversation today about surgery.  I do think needs to be done due to situation. We discussed laparotomy, transverse colectomy with colostomy and long hartmanns.  Will also place another g tube. Leave wound open. Risks are very high and I think her mortality is high.  The nsqip calculator predicts about 30% mortality and I think this may underestimate.  The risk of complications is much higher.  These include but are not limited to bleeding, infection, reoperation, stroke, mi, arrythmia, death and many others.  I don't think this will make her better either. I discussed this with her son [name], her son [name] over the phone and her niece [name].

## 2014-06-22 NOTE — Progress Notes (Addendum)
NUTRITION CONSULT/FOLLOW UP  DOCUMENTATION CODES Per approved criteria  -Severe malnutrition in the context of chronic illness   INTERVENTION:  TPN per pharmacy RD to follow for nutrition care plan  NUTRITION DIAGNOSIS: Inadequate oral intake related to inability to eat as evidenced by NPO status with PEG for TF, ongoing   Goal: Intake to meet >90% of estimated nutrition needs, currently unmet  Monitor:  TPN prescription, TF re-initiation, respiratory status, weight, labs, I/O's   ASSESSMENT: 73 y.o. F, [name] resident on chronic trach/vent, brought to ED for concerns that PEG tube had perforated her colon. Per reports, PEG has been draining chronically, some of drainage thought to be stool. Surgeon at [name] was concerned that PEG may be transversing colon into stomach. Pt was brought to ED for evaluation of "perforated viscus"; however, upon [facility], this was determined to not be the case.   8/6:  KUB 8/5 showed no free air, generalized bowel distention, stable gastrostomy.   Per review of PTA, patient received bolus TF via PEG with Isosource 1.5 300 ml every 6 hours with free water flushes 30 ml/h to provide 1800 kcals, 82 gm protein, 1637 ml free water daily. Also took some PO's, pureed foods and nectar thick liquids.  8/10 1146:  Patient is currently on ventilator support -- trach MV: 5.1 L/min Temp (24hrs), Avg:98.2 F (36.8 C), Min:97.5 F (36.4 C), Max:98.9 F (37.2 C)   RD consulted for TF initiation and management.    Vital HP formula initiated at 20 ml/hr via Adult Tube Feeding Protocol 8/8.  Stopped later in the day 8/8.  RD spoke with RN, TF still on hold for possible surgery.  Will D/C Vital HP formula at this time.  8/10 1312:  RD consulted for new TPN due to perforated colon.  Patient is receiving TPN with Clinimix E 5/15 @ 40 ml/hr and lipids @ 10 ml/hr. Provides 1162 kcal and 48 grams protein per day. Meets 83% minimum estimated energy needs and  68% minimum estimated protein needs.  Height: Ht Readings from Last 1 Encounters:  06/20/14 5' 1.02" (1.55 m)    Weight: Wt Readings from Last 1 Encounters:  06/22/14 129 lb 13.6 oz (58.9 kg)    BMI:  Body mass index is 24.52 kg/(m^2).   Estimated Nutritional Needs: Kcal: 1400-1600 Protein: 70-80 gm Fluid: 1.4-1.6 L  Skin: wound VAC to sacral wound  Diet Order: NPO   Intake/Output Summary (Last 24 hours) at 06/22/14 1006 Last data filed at 06/22/14 0008  Gross per 24 hour  Intake   1100 ml  Output   1050 ml  Net     50 ml   Labs:   Recent Labs Lab 06/17/14 1632  06/18/14 0630 06/19/14 1100 06/21/14 0119  NA 139  < > 140 143 141  K 4.2  < > 3.9 3.8 3.1*  CL 96  < > 97 101 100  CO2 30  < > 35* 34* 31  BUN 23  < > 18 13 9   CREATININE 0.50  < > 0.49* 0.47* 0.46*  CALCIUM 8.9  < > 8.8 8.7 8.2*  MG 1.6  --   --   --   --   PHOS 4.0  --   --   --   --   GLUCOSE 71  < > 149* 119* 139*  < > = values in this interval not displayed.  CBG (last 3)   Recent Labs  06/22/14 0006 06/22/14 0504 06/22/14  7588  GLUCAP 199* 121* 182*    Scheduled Meds: . antiseptic oral rinse  7 mL Mouth Rinse QID  . chlorhexidine  15 mL Mouth Rinse BID  . Chlorhexidine Gluconate Cloth  6 each Topical Q0600  . ciprofloxacin  400 mg Intravenous Q12H  . feeding supplement (VITAL HIGH PROTEIN)  1,000 mL Per Tube Q24H  . hydrocortisone sod succinate (SOLU-CORTEF) inj  50 mg Intravenous 4 times per day  . metronidazole  500 mg Intravenous Q8H  . mupirocin ointment  1 application Nasal BID  . pantoprazole (PROTONIX) IV  40 mg Intravenous Q24H  . vancomycin  1,000 mg Intravenous Q24H    Continuous Infusions: . dextrose 5 % and 0.45 % NaCl with KCl 20 mEq/L 75 mL/hr at 06/21/14 1115    Past Medical History  Diagnosis Date  . Hypertension   . 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 Arthritis   . Anxiety   . Cachexia 12/2013  . DM type 2 (diabetes  mellitus, type 2)   . 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 Anemia 12/2013  . Dementia   . Compression fracture of lumbar vertebra, non-traumatic 12/2013    L3 and L1.     Past Surgical History  Procedure Laterality Date  . Vaginal hysterectomy      ? Abdominal vs. Vaginal    Maureen Chatters, RD, LDN Pager #: [phone] After-Hours Pager #: [phone]

## 2014-06-22 NOTE — Progress Notes (Addendum)
Agree with above, I do think she will need surgery for this, I will need to discuss this with family as Im not sure they have clear understanding of what surgery involves or will mean for her The "mass" in llq appears by her ct and by exam to be rectus sheath hematoma

## 2014-06-22 NOTE — Progress Notes (Signed)
PARENTERAL NUTRITION CONSULT NOTE - INITIAL  Pharmacy Consult for TPN Indication: Perforated colon  Allergies  Allergen Reactions  . Namenda [Memantine Hcl] Other (See Comments)    "hallucinations"   . Codeine Other (See Comments)    Unknown. Listed on [facility]  . Penicillins Other (See Comments)    Unknown; listed on [facility]    Patient Measurements: Height: 5' 1.02" (155 cm) Weight: 129 lb 13.6 oz (58.9 kg) IBW/kg (Calculated) : 47.86 Adjusted Body Weight:  Usual Weight:   Vital Signs: Temp: 97.5 F (36.4 C) (08/10 0812) Temp src: Axillary (08/10 0812) BP: 121/55 mmHg (08/10 1000) Pulse Rate: 54 (08/10 1000) Intake/Output from previous day: 08/09 0701 - 08/10 0700 In: 1625 [I.V.:825; IV Piggyback:800] Out: 1050 [Urine:1050] Intake/Output from this shift: Total I/O In: 300 [IV Piggyback:300] Out: -   Labs:  Recent Labs  06/21/14 0119 06/22/14 0812  WBC 9.4  --   HGB 7.5*  --   HCT 24.3*  --   PLT 300  --   APTT >200*  --   INR 2.50* 1.32     Recent Labs  06/21/14 0119  NA 141  K 3.1*  CL 100  CO2 31  GLUCOSE 139*  BUN 9  CREATININE 0.46*  CALCIUM 8.2*   Estimated Creatinine Clearance: 53.3 ml/min (by C-G formula based on Cr of 0.46).    Recent Labs  06/22/14 0006 06/22/14 0504 06/22/14 0808  GLUCAP 199* 121* 182*    Medical History: Past Medical History  Diagnosis Date  . Hypertension   . 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 Arthritis   . Anxiety   . Cachexia 12/2013  . DM type 2 (diabetes mellitus, type 2)   . 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 Anemia 12/2013  . Dementia   . Compression fracture of lumbar vertebra, non-traumatic 12/2013    L3 and L1.     Insulin Requirements in the past 24 hours:  To be started 8/10  Current Nutrition:  None  IVF: D5 45NS +K20 at 26ml/hr  Assessment: 73 y/o F from [name] with chronic trach presents with draining PEG tube  found to be trans-colonic.  GI: Pt on TF at [name] with prealbumin 19.2 on 8/5. Plan to start TPN in anticipation of prolonged inability to receive TF post-op.  Endo: DM. Glucose 121-202 on chronic steroids. Start SSI today.  Lytes: K 3.1. Kruns x 4 ordered  Renal: Scr 0.46. UOP 0.7.  Pulm: COPD, chronic trach,  Cards: HTN. Bradycardia this AM.  Hepatobil: LFT's WNL.INR 2.5>>1.32? Aptt>200?? Lab error?  Neuro: anxiety, dementia, lumbar compression fx.   ID:  Afebrile. WBC 9.4 on Vanco/Flagyl/Cipro for   Best Practices: IV PPI  TPN Access: 8/6 PICC line  TPN day#: 0  Nutritional Goals:  1400-1600 kCal, 70-80 grams of protein per  day  Plan:  Consider resuming DVT prophylaxis if coags remain normal. F/u plans for surgery Start TPN at 33ml/hr with lipids at 15ml/hr. Change IVF to 1/2 NS + K20 at 93ml/hr Start SSI Nutrition labs Mon/Thurs.    Maahi Lannan S. Merilynn Finland, PharmD, BCPS Clinical Staff Pharmacist Pager [phone]  Misty Stanley Stillinger 06/22/2014,12:37 PM

## 2014-06-23 ENCOUNTER — Inpatient Hospital Stay (HOSPITAL_COMMUNITY): Payer: Medicare Other

## 2014-06-23 LAB — PHOSPHORUS: PHOSPHORUS: 2.9 mg/dL (ref 2.3–4.6)

## 2014-06-23 LAB — COMPREHENSIVE METABOLIC PANEL
ALK PHOS: 99 U/L (ref 39–117)
ALT: 15 U/L (ref 0–35)
AST: 12 U/L (ref 0–37)
Albumin: 2.5 g/dL — ABNORMAL LOW (ref 3.5–5.2)
Anion gap: 7 (ref 5–15)
BUN: 10 mg/dL (ref 6–23)
CALCIUM: 8.4 mg/dL (ref 8.4–10.5)
CHLORIDE: 103 meq/L (ref 96–112)
CO2: 32 meq/L (ref 19–32)
Creatinine, Ser: 0.48 mg/dL — ABNORMAL LOW (ref 0.50–1.10)
GLUCOSE: 126 mg/dL — AB (ref 70–99)
Potassium: 3.4 mEq/L — ABNORMAL LOW (ref 3.7–5.3)
SODIUM: 142 meq/L (ref 137–147)
Total Protein: 5.4 g/dL — ABNORMAL LOW (ref 6.0–8.3)

## 2014-06-23 LAB — MAGNESIUM: Magnesium: 1.6 mg/dL (ref 1.5–2.5)

## 2014-06-23 LAB — GLUCOSE, CAPILLARY
GLUCOSE-CAPILLARY: 118 mg/dL — AB (ref 70–99)
GLUCOSE-CAPILLARY: 157 mg/dL — AB (ref 70–99)
GLUCOSE-CAPILLARY: 185 mg/dL — AB (ref 70–99)
GLUCOSE-CAPILLARY: 216 mg/dL — AB (ref 70–99)
Glucose-Capillary: 165 mg/dL — ABNORMAL HIGH (ref 70–99)
Glucose-Capillary: 145 mg/dL — ABNORMAL HIGH (ref 70–99)
Glucose-Capillary: 129 mg/dL — ABNORMAL HIGH (ref 70–99)

## 2014-06-23 LAB — CBC
HEMATOCRIT: 25.4 % — AB (ref 36.0–46.0)
Hemoglobin: 7.9 g/dL — ABNORMAL LOW (ref 12.0–15.0)
MCH: 31.3 pg (ref 26.0–34.0)
MCHC: 31.1 g/dL (ref 30.0–36.0)
MCV: 100.8 fL — AB (ref 78.0–100.0)
PLATELETS: 313 10*3/uL (ref 150–400)
RBC: 2.52 MIL/uL — AB (ref 3.87–5.11)
RDW: 15.6 % — ABNORMAL HIGH (ref 11.5–15.5)
WBC: 9.6 10*3/uL (ref 4.0–10.5)

## 2014-06-23 LAB — TRIGLYCERIDES: TRIGLYCERIDES: 83 mg/dL (ref <150)

## 2014-06-23 LAB — PROTIME-INR
INR: 1.28 (ref 0.00–1.49)
Prothrombin Time: 16 seconds — ABNORMAL HIGH (ref 11.6–15.2)

## 2014-06-23 LAB — PREALBUMIN
PREALBUMIN: 20.9 mg/dL (ref 17.0–34.0)
Prealbumin: 20.6 mg/dL (ref 17.0–34.0)

## 2014-06-23 LAB — APTT: aPTT: 26 seconds (ref 24–37)

## 2014-06-23 MED ORDER — POTASSIUM CHLORIDE 10 MEQ/50ML IV SOLN
10.0000 meq | INTRAVENOUS | Status: AC
  Administered 2014-06-23 (×4): 10 meq via INTRAVENOUS
  Filled 2014-06-23 (×2): qty 50

## 2014-06-23 MED ORDER — TRACE MINERALS CR-CU-F-FE-I-MN-MO-SE-ZN IV SOLN
INTRAVENOUS | Status: AC
  Administered 2014-06-23: 18:00:00 via INTRAVENOUS
  Filled 2014-06-23: qty 2000

## 2014-06-23 MED ORDER — POTASSIUM CHLORIDE 2 MEQ/ML IV SOLN
INTRAVENOUS | Status: DC
  Administered 2014-06-23 – 2014-06-27 (×2): via INTRAVENOUS
  Filled 2014-06-23 (×3): qty 1000

## 2014-06-23 MED ORDER — FAT EMULSION 20 % IV EMUL
250.0000 mL | INTRAVENOUS | Status: AC
  Administered 2014-06-23: 250 mL via INTRAVENOUS
  Filled 2014-06-23: qty 250

## 2014-06-23 MED ORDER — MAGNESIUM SULFATE 40 MG/ML IJ SOLN
2.0000 g | Freq: Once | INTRAMUSCULAR | Status: AC
  Administered 2014-06-23: 2 g via INTRAVENOUS
  Filled 2014-06-23: qty 50

## 2014-06-23 MED ORDER — HYDROCORTISONE NA SUCCINATE PF 100 MG IJ SOLR
50.0000 mg | Freq: Three times a day (TID) | INTRAMUSCULAR | Status: DC
  Administered 2014-06-23 – 2014-06-29 (×18): 50 mg via INTRAVENOUS
  Filled 2014-06-23 (×21): qty 1

## 2014-06-23 NOTE — Progress Notes (Signed)
Mr. Paulla Dolly, pt's son called and stated that they haven't made a decision yet.  I encouraged him to call at 1000 tomorrow and ask the nurse if he can talk to the surgeon.  He stated that he will call tomorrow.

## 2014-06-23 NOTE — Progress Notes (Signed)
PULMONARY / CRITICAL CARE MEDICINE   Name: Katelyn Rojas MRN: [id number] DOB: Jul 15, 1941    ADMISSION DATE:  06/17/2014 CONSULTATION DATE:  06/17/14  REFERRING MD :  [name] >> ED  CHIEF COMPLAINT: ? Perforated viscus   INITIAL PRESENTATION: 73 y.o. F, [name] resident on chronic trach, brought to ED for concerns that PEG tube had perforated her colon. Per reports, PEG has been draining chronically, some of drainage thought to be stool. Surgeon at [name] was concerned that PEG may be transversing colon into stomach. Pt was brought to ED for evaluation of "perforated viscus"; however, upon arrival to [facility] ED, this was determined to not be the case. PCCM was called for admission.   STUDIES:  8/5 KUB >>> no free air, generalized bowel distention, stable gastrostomy.  8/6 >> No surgery needed, IR to evaluate the G-tube.  8/6 >> BE shows Gastrostomy tube traverses distal transverse segment. Some contrast noted to exit at skin entry site along G tube. No obstruction   SIGNIFICANT EVENTS:  8/5 admit >>> transferred from [name], concerns of perforated viscus, imaging at [facility] ED suggested this to not be the case.  8/7>> Surgery discussing with family about revision of PEG tube, Dr. Dwain Sarna to meet with family on 8/10 to discuss surgical options. Plan to go to or 8/10 possibly.  8/10 TPN started. Surgery planning on to OR soon.   SUBJECTIVE:  Awake and alert. NAD. More alert.  VITAL SIGNS: Temp:  [98 F (36.7 C)-98.9 F (37.2 C)] 98.6 F (37 C) (08/11 0843) Pulse Rate:  [45-71] 65 (08/11 0843) Resp:  [12-19] 14 (08/11 0843) BP: (93-126)/(49-63) 105/61 mmHg (08/11 0843) SpO2:  [97 %-100 %] 97 % (08/11 0843) FiO2 (%):  [35 %] 35 % (08/11 0843) Weight:  [129 lb 13.6 oz (58.9 kg)] 129 lb 13.6 oz (58.9 kg) (08/11 0400) HEMODYNAMICS:   VENTILATOR SETTINGS: Vent Mode:  [-] PRVC FiO2 (%):  [35 %] 35 % Set Rate:  [14 bmp] 14 bmp Vt Set:  [400 mL] 400 mL PEEP:  [5 cmH20] 5 cmH20 Plateau  Pressure:  [14 cmH20-28 cmH20] 28 cmH20 INTAKE / OUTPUT:  Intake/Output Summary (Last 24 hours) at 06/23/14 0940 Last data filed at 06/23/14 0450  Gross per 24 hour  Intake      0 ml  Output   1600 ml  Net  -1600 ml    PHYSICAL EXAMINATION: General: Chronically ill appearing female, cachectic, resting in bed, in NAD. Kyphotic. Nods to questions. Neuro: Attempts to converse. / more alert HEENT: Temporal wasting,   MM dry.  Cardiovascular: RRR, no M/R/G.  Lungs: Respirations even and unlabored on vent. Diminished bs bases.  Abdomen: BS x 4, soft, NT/ND. PEG in place with dried drainage noted around button , smells like feces. On TNA Musculoskeletal: Muscle wasting, no edema.  Skin: Warm, no rashes, sacral decub ulcer.   LABS:  CBC  Recent Labs Lab 06/19/14 1100 06/21/14 0119 06/23/14 0525  WBC 10.0 9.4 9.6  HGB 8.0* 7.5* 7.9*  HCT 26.5* 24.3* 25.4*  PLT 341 300 313   Coag's  Recent Labs Lab 06/17/14 1655 06/21/14 0119 06/22/14 0812 06/23/14 0525  APTT 28 >200*  --  26  INR 1.16 2.50* 1.32 1.28   BMET  Recent Labs Lab 06/21/14 0119 06/22/14 1311 06/23/14 0525  NA 141 141 142  K 3.1* 3.4* 3.4*  CL 100 102 103  CO2 31 29 32  BUN 9 7 10   CREATININE 0.46* 0.44* 0.48*  GLUCOSE  139* 142* 126*   Electrolytes  Recent Labs Lab 06/17/14 1632  06/21/14 0119 06/22/14 1311 06/23/14 0525  CALCIUM 8.9  < > 8.2* 8.3* 8.4  MG 1.6  --   --   --  1.6  PHOS 4.0  --   --   --  2.9  < > = values in this interval not displayed. Sepsis Markers No results found for this basename: LATICACIDVEN, PROCALCITON, O2SATVEN,  in the last 168 hours ABG No results found for this basename: PHART, PCO2ART, PO2ART,  in the last 168 hours Liver Enzymes  Recent Labs Lab 06/17/14 1655 06/22/14 0812 06/23/14 0525  AST 25 15 12   ALT 30 16 15   ALKPHOS 172* 104 99  BILITOT 0.4 0.2* <0.2*  ALBUMIN 3.1* 2.6* 2.5*   Cardiac Enzymes No results found for this basename:  TROPONINI, PROBNP,  in the last 168 hours Glucose  Recent Labs Lab 06/22/14 0808 06/22/14 1250 06/22/14 1634 06/22/14 2041 06/22/14 2358 06/23/14 0434  GLUCAP 182* 177* 142* 197* 216* 118*    Imaging No results found.   ASSESSMENT / PLAN:  PULMONARY  Trach 04/01/14  A:  Chronic respiratory failure  COPD  Tracheostomy Status  P:  Full vent support.  Wean as able.  Albuterol PRN.   CARDIOVASCULAR  A:  Chronic Hypotension - per son, pt's SBP usually in 80's.  P:  Stress steroids. 50 mg q6hrs Hold outpatient lopressor.   RENAL  A: hypokalemia P:  Replace electrolytes as needed(repleted 8/11  IV as gut not available)  GASTROINTESTINAL  A:  PEG malfunctioning , misplaced  Bowel perforation  Severe protein calorie malnutrition   P:  Pantoprazole.  GI consulted- not a candidate for colonoscopy Seen by surgery - Dr. Dwain SarnaWakeField to discuss surgical options with Family on 8/10. Await family decision as to surgery. Placed on TPN    HEMATOLOGIC  A:  Chronic Anemia  Coagulopathy on [place] Hep P:  VTE prophylaxis: d/c Heparin 8/9 / PAS hose Transfuse for Hgb < 7.   .   INFECTIOUS  A:  Hx MRSA and enterococcus bacteremia Hx Tracheitis (cultures + for Alcaligenes bacteria)  Sacra decubitus ulcer  P:  Abx: Vanc, Cipro, Flagyl (from [facility]).  Monitor fever curve / WBC's.  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  P:  SSI.  Stress steroids. ( 8/11 decrease to q 8 h)  NEUROLOGIC  A:  Dementia  Anxiety  P:   Fentanyl, Ativan PRN.  Hold outpatient Celexa, Clonopin, Aricept.  Monitor.     TODAY'S SUMMARY:73 y.o. F from [name] on chronic trach, [facility] tube has perforated her transverse colon. She is for surgery week of  8/10. All peg and TF issues per [facility].   Brett CanalesSteve Minor ACNP [facility] Pager [phone] till 3 pm If no answer page [phone] 06/23/2014, 9:40  AM  Attending:  I have seen and examined the patient with nurse practitioner/resident and agree with the note above.   Looks OK today, mild pain Awaiting family decision on surgery  Heber CarolinaBrent McQuaid, MD [place] [facility] Pager: [phone] Cell: [phone] If no response, call [phone]

## 2014-06-23 NOTE — Progress Notes (Signed)
Patient ID: Katelyn Rojas, female   DOB: 11/15/1940, 73 y.o.   MRN: [id number]    Subjective: On vent  No c/o  Objective: Vital signs in last 24 hours: Temp:  [97.5 F (36.4 C)-98.9 F (37.2 C)] 98 F (36.7 C) (08/11 0400) Pulse Rate:  [45-71] 58 (08/11 0400) Resp:  [12-21] 14 (08/11 0400) BP: (93-126)/(49-63) 93/51 mmHg (08/11 0400) SpO2:  [98 %-100 %] 99 % (08/11 0400) FiO2 (%):  [35 %] 35 % (08/11 0312) Weight:  [129 lb 13.6 oz (58.9 kg)] 129 lb 13.6 oz (58.9 kg) (08/11 0400) Last BM Date: 06/18/14  Intake/Output from previous day: 08/10 0701 - 08/11 0700 In: 300 [IV Piggyback:300] Out: 1600 [Urine:1600] Intake/Output this shift:    PE: Abd: soft, mildly tender, PEG in place with no active drainage currently Heart: still brady  Lab Results:   Recent Labs  06/21/14 0119 06/23/14 0525  WBC 9.4 9.6  HGB 7.5* 7.9*  HCT 24.3* 25.4*  PLT 300 313   BMET  Recent Labs  06/22/14 1311 06/23/14 0525  NA 141 142  K 3.4* 3.4*  CL 102 103  CO2 29 32  GLUCOSE 142* 126*  BUN 7 10  CREATININE 0.44* 0.48*  CALCIUM 8.3* 8.4   PT/INR  Recent Labs  06/22/14 0812 06/23/14 0525  LABPROT 16.4* 16.0*  INR 1.32 1.28   CMP     Component Value Date/Time   NA 142 06/23/2014 0525   K 3.4* 06/23/2014 0525   CL 103 06/23/2014 0525   CO2 32 06/23/2014 0525   GLUCOSE 126* 06/23/2014 0525   BUN 10 06/23/2014 0525   CREATININE 0.48* 06/23/2014 0525   CALCIUM 8.4 06/23/2014 0525   PROT 5.4* 06/23/2014 0525   ALBUMIN 2.5* 06/23/2014 0525   AST 12 06/23/2014 0525   ALT 15 06/23/2014 0525   ALKPHOS 99 06/23/2014 0525   BILITOT <0.2* 06/23/2014 0525   GFRNONAA >90 06/23/2014 0525   GFRAA >90 06/23/2014 0525   Lipase  No results found for this basename: lipase       Studies/Results: Dg Chest Port 1 View  06/23/2014   CLINICAL DATA:  Shortness of breath.  Tracheostomy tube.  EXAM: PORTABLE CHEST - 1 VIEW  COMPARISON:  Single view of the chest 06/21/2014 and 06/17/2014.   FINDINGS: Tracheostomy tube and right PICC remain in place. Small to moderate bilateral pleural effusions and basilar atelectasis have increased. The lungs appear emphysematous. Heart size is normal.  IMPRESSION: Increased small to moderate bilateral pleural effusions and basilar atelectasis.   Electronically Signed   By: Drusilla Kannerhomas  Dalessio M.D.   On: 06/23/2014 07:33    Anti-infectives: Anti-infectives   Start     Dose/Rate Route Frequency Ordered Stop   06/18/14 0600  vancomycin (VANCOCIN) IVPB 1000 mg/200 mL premix     1,000 mg 200 mL/hr over 60 Minutes Intravenous Every 24 hours 06/17/14 2234     06/17/14 2100  ciprofloxacin (CIPRO) IVPB 400 mg     400 mg 200 mL/hr over 60 Minutes Intravenous Every 12 hours 06/17/14 1816     06/17/14 1700  metroNIDAZOLE (FLAGYL) IVPB 500 mg     500 mg 100 mL/hr over 60 Minutes Intravenous Every 8 hours 06/17/14 1658         Assessment/Plan   1. Transcolonic PEG tube with feculent drainage around PEG tube  2. Chronic ventilator dependent respiratory failure, on chronic steroids  3. Anticoagulated, false lab value on 8/9.  INR 1.2 today 4. New  onset bradycardia  5. Multiple medical problems  Plan: 1. Cont TNA for nutritional support while unable to get enteral feeds 2. Awaiting family to decide on whether to proceed with surgery or not.   LOS: 6 days    Maxmilian Trostel E 06/23/2014, 7:51 AM Pager: [phone]

## 2014-06-23 NOTE — Progress Notes (Signed)
Patient ID: Latreese Dotterer, female   DOB: 03/30/1941, 73 y.o.   MRN: [id number] I have checked in the patient's room twice today to see if there is any family present to determine their decision for surgery vs conservative management.  Her brother was there during my second visit.  He is not part of the family that is making the decisions.  He informed me that the niece, [name], had more questions prior to making any decisions and requested a call.  He was unaware if she would be coming by the hospital today.  I asked if [name], the son, would be here today.  He said he thought maybe after 4pm, but was unsure if he would be by.  I tried to call [name] and got her voicemail.  A decision needs to be made soon by the family if they would like for [place] to pursue surgical care.  The family also has been encouraged to come to the hospital for discussions as this makes communication and decision making better.  As of right now, per the patient's brother, the family is still undecided.  Brently Voorhis E 2:09 PM 06/23/2014

## 2014-06-23 NOTE — Progress Notes (Signed)
Discussed surgery yesterday, await family decision.

## 2014-06-23 NOTE — Progress Notes (Signed)
PARENTERAL NUTRITION CONSULT NOTE   Pharmacy Consult for TPN Indication: Perforated colon  Allergies  Allergen Reactions  . Namenda [Memantine Hcl] Other (See Comments)    "hallucinations"   . Codeine Other (See Comments)    Unknown. Listed on [facility]  . Penicillins Other (See Comments)    Unknown; listed on [facility]    Patient Measurements: Height: 5' 1.02" (155 cm) Weight: 129 lb 13.6 oz (58.9 kg) IBW/kg (Calculated) : 47.86 Adjusted Body Weight:  Usual Weight:   Vital Signs: Temp: 98 F (36.7 C) (08/11 0400) Temp src: Oral (08/11 0400) BP: 93/51 mmHg (08/11 0400) Pulse Rate: 58 (08/11 0400) Intake/Output from previous day: 08/10 0701 - 08/11 0700 In: 300 [IV Piggyback:300] Out: 1600 [Urine:1600] Intake/Output from this shift:    Labs:  Recent Labs  06/21/14 0119 06/22/14 0812 06/23/14 0525  WBC 9.4  --  9.6  HGB 7.5*  --  7.9*  HCT 24.3*  --  25.4*  PLT 300  --  313  APTT >200*  --  26  INR 2.50* 1.32 1.28     Recent Labs  06/21/14 0119 06/22/14 0812 06/22/14 1311 06/23/14 0525  NA 141  --  141 142  K 3.1*  --  3.4* 3.4*  CL 100  --  102 103  CO2 31  --  29 32  GLUCOSE 139*  --  142* 126*  BUN 9  --  7 10  CREATININE 0.46*  --  0.44* 0.48*  CALCIUM 8.2*  --  8.3* 8.4  MG  --   --   --  1.6  PHOS  --   --   --  2.9  PROT  --  5.7*  --  5.4*  ALBUMIN  --  2.6*  --  2.5*  AST  --  15  --  12  ALT  --  16  --  15  ALKPHOS  --  104  --  99  BILITOT  --  0.2*  --  <0.2*  BILIDIR  --  <0.2  --   --   IBILI  --  NOT CALCULATED  --   --   PREALBUMIN  --   --  20.9  --   TRIG  --   --  62 83   Estimated Creatinine Clearance: 53.3 ml/min (by C-G formula based on Cr of 0.48).    Recent Labs  06/22/14 2041 06/22/14 2358 06/23/14 0434  GLUCAP 197* 216* 118*    Medical History: Past Medical History  Diagnosis Date  . Hypertension   . 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  Arthritis   . Anxiety   . Cachexia 12/2013  . DM type 2 (diabetes mellitus, type 2)   . 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 Anemia 12/2013  . Dementia   . Compression fracture of lumbar vertebra, non-traumatic 12/2013    L3 and L1.     Insulin Requirements in the past 24 hours:  6 units SSI (CBGs 588-325)  Current Nutrition:  None  IVF: 45NS +K20 at 39ml/hr  Assessment: 73 y/o F from [name] with chronic trach presents with draining PEG tube found to be trans-colonic.  GI: Pt on TF at [name] with prealbumin 19.2 on 8/5. Plan to start TPN in anticipation of prolonged inability to receive TF post-op. Current prealbumin 20.9. IV PPI  Endo: DM. Glucose 118-216 on chronic steroids. Started SSI. (CBG  197, 215 on TPN but only 118 this AM). Will watch for further trends before adding insulin to TPN  Lytes: K 3.1>>3.4. Kruns x 4 ordered again, Mg low normal 1.5, Phos 2.9  Renal: Scr 0.48. UOP 1.1.  Pulm: COPD, chronic trach,  Cards: h/o HTN. BP 93/51, HR 58 currently on no CV meds.  Hepatobil: LFT's WNL. INR normal 1.28. APTT 26. Likely previous lab error. Trigly 83  Neuro: anxiety, dementia, lumbar compression fx.   ID: Afebrile. WBC 9.6 on Vanco/Flagyl/Cipro for   Best Practices: IV PPI  TPN Access: 8/6 PICC line  TPN day#: 1  Nutritional Goals:   1400-1600 kCal, 70-80 grams of protein per day  Plan:  1. Consider resuming DVT prophylaxis as coags remain normal if no OR today--CCM decision. 2. F/u plans for surgery 3. Increase TPN to 1260ml/hr with lipids at 1510ml/hr.(Goal) 4. Change IVF to 1/2 NS + K40 at kvo ml/hr (9.446meq K+/day) 5. Nutrition labs Mon/Thurs. 6. Krun x 4 again today.  7. Magnesium sulfate 2g IV x 1    Yoseline Andersson S. Merilynn Finlandobertson, PharmD, BCPS Clinical Staff Pharmacist Pager [phone]  Misty Stanleyobertson, Nichlos Kunzler Stillinger 06/23/2014,8:29 AM

## 2014-06-24 LAB — BASIC METABOLIC PANEL
Anion gap: 8 (ref 5–15)
BUN: 17 mg/dL (ref 6–23)
CALCIUM: 8 mg/dL — AB (ref 8.4–10.5)
CHLORIDE: 102 meq/L (ref 96–112)
CO2: 33 mEq/L — ABNORMAL HIGH (ref 19–32)
CREATININE: 0.42 mg/dL — AB (ref 0.50–1.10)
GFR calc Af Amer: >90 mL/min (ref >90)
GFR calc non Af Amer: >90 mL/min (ref >90)
GLUCOSE: 140 mg/dL — AB (ref 70–99)
Potassium: 3.7 mEq/L (ref 3.7–5.3)
Sodium: 143 mEq/L (ref 137–147)

## 2014-06-24 LAB — GLUCOSE, CAPILLARY
GLUCOSE-CAPILLARY: 138 mg/dL — AB (ref 70–99)
Glucose-Capillary: 116 mg/dL — ABNORMAL HIGH (ref 70–99)
Glucose-Capillary: 128 mg/dL — ABNORMAL HIGH (ref 70–99)
Glucose-Capillary: 169 mg/dL — ABNORMAL HIGH (ref 70–99)
Glucose-Capillary: 204 mg/dL — ABNORMAL HIGH (ref 70–99)
Glucose-Capillary: 212 mg/dL — ABNORMAL HIGH (ref 70–99)

## 2014-06-24 LAB — PHOSPHORUS: Phosphorus: 2.8 mg/dL (ref 2.3–4.6)

## 2014-06-24 LAB — MAGNESIUM: Magnesium: 2.1 mg/dL (ref 1.5–2.5)

## 2014-06-24 MED ORDER — TRACE MINERALS CR-CU-F-FE-I-MN-MO-SE-ZN IV SOLN
INTRAVENOUS | Status: AC
  Administered 2014-06-24: 17:00:00 via INTRAVENOUS
  Filled 2014-06-24: qty 2000

## 2014-06-24 MED ORDER — FAT EMULSION 20 % IV EMUL
250.0000 mL | INTRAVENOUS | Status: AC
  Administered 2014-06-24: 250 mL via INTRAVENOUS
  Filled 2014-06-24: qty 250

## 2014-06-24 NOTE — Progress Notes (Signed)
[facility] who stated that they are willing to accept patient back when stable but they cannot accept back if PEG continues to leak stool.  CSW will provide support and assist as needed; patient updated today with Katelyn Rojas, [facility] and discussed with [facility]. Lorri Frederick. Jaci Lazier, [place] [phone]

## 2014-06-24 NOTE — Progress Notes (Signed)
PULMONARY / CRITICAL CARE MEDICINE   Name: Katelyn Rojas MRN: [id number] DOB: [birth year]    ADMISSION DATE:  06/17/2014 CONSULTATION DATE:  06/17/14  REFERRING MD :  [name] >> ED  CHIEF COMPLAINT: ? Perforated viscus   INITIAL PRESENTATION: 73 y.o. F, [name] resident on chronic trach, brought to ED for concerns that PEG tube had perforated her colon. Per reports, PEG has been draining chronically, some of drainage thought to be stool. Surgeon at [name] was concerned that PEG may be transversing colon into stomach. Pt was brought to ED for evaluation of "perforated viscus"; however, upon arrival to [facility] ED, this was determined to not be the case. PCCM was called for admission.   STUDIES:  8/5 KUB >>> no free air, generalized bowel distention, stable gastrostomy.  8/6 >> No surgery needed, IR to evaluate the G-tube.  8/6 >> BE shows Gastrostomy tube traverses distal transverse segment. Some contrast noted to exit at skin entry site along G tube. No obstruction   SIGNIFICANT EVENTS:  8/5 admit >>> transferred from [name], concerns of perforated viscus, imaging at [facility] ED suggested this to not be the case.  8/7>> Surgery discussing with family about revision of PEG tube, Dr. Dwain Sarna to meet with family on 8/10 to discuss surgical options. Plan to go to or 8/10 possibly.  8/10 TPN started. Surgery planning on to OR soon. 8/12 family has not decided about surgery yet.  SUBJECTIVE:  Awake and alert. NAD. REPORTS INCREASE WOB  VITAL SIGNS: Temp:  [97.3 F (36.3 C)-98.6 F (37 C)] 97.3 F (36.3 C) (08/12 0826) Pulse Rate:  [46-112] 54 (08/12 0826) Resp:  [11-24] 13 (08/12 0826) BP: (104-157)/(45-84) 106/45 mmHg (08/12 0826) SpO2:  [95 %-100 %] 100 % (08/12 0826) FiO2 (%):  [30 %-35 %] 30 % (08/12 0826) Weight:  [136 lb 11 oz (62 kg)-137 lb 12.6 oz (62.5 kg)] 137 lb 12.6 oz (62.5 kg) (08/12 0450) HEMODYNAMICS:   VENTILATOR SETTINGS: Vent Mode:  [-] PRVC FiO2 (%):  [30 %-35 %] 30  % Set Rate:  [14 bmp] 14 bmp Vt Set:  [400 mL] 400 mL PEEP:  [5 cmH20] 5 cmH20 Plateau Pressure:  [20 cmH20-24 cmH20] 20 cmH20 INTAKE / OUTPUT:  Intake/Output Summary (Last 24 hours) at 06/24/14 1011 Last data filed at 06/24/14 0900  Gross per 24 hour  Intake 4884.67 ml  Output   2150 ml  Net 2734.67 ml    PHYSICAL EXAMINATION: General: Chronically ill appearing female, cachectic, resting in bed, in NAD. Kyphotic. Nods to questions.Reports increase wob , no change on vent. Neuro: Attempts to converse.  HEENT: Temporal wasting,   MM dry.  Cardiovascular: RRR, no M/R/G.  Lungs: Respirations even and unlabored on vent. Diminished bs bases. No overt increase wob Abdomen: BS x 4, soft, NT/ND. PEG in place with dried drainage noted around button . On TNA Musculoskeletal: Muscle wasting, no edema.  Skin: Warm, no rashes, sacral decub ulcer.   LABS:  CBC  Recent Labs Lab 06/19/14 1100 06/21/14 0119 06/23/14 0525  WBC 10.0 9.4 9.6  HGB 8.0* 7.5* 7.9*  HCT 26.5* 24.3* 25.4*  PLT 341 300 313   Coag's  Recent Labs Lab 06/17/14 1655 06/21/14 0119 06/22/14 0812 06/23/14 0525  APTT 28 >200*  --  26  INR 1.16 2.50* 1.32 1.28   BMET  Recent Labs Lab 06/22/14 1311 06/23/14 0525 06/24/14 0450  NA 141 142 143  K 3.4* 3.4* 3.7  CL 102 103 102  CO2 29  32 33*  BUN 7 10 17   CREATININE 0.44* 0.48* 0.42*  GLUCOSE 142* 126* 140*   Electrolytes  Recent Labs Lab 06/17/14 1632  06/22/14 1311 06/23/14 0525 06/24/14 0450  CALCIUM 8.9  < > 8.3* 8.4 8.0*  MG 1.6  --   --  1.6 2.1  PHOS 4.0  --   --  2.9 2.8  < > = values in this interval not displayed. Sepsis Markers No results found for this basename: LATICACIDVEN, PROCALCITON, O2SATVEN,  in the last 168 hours ABG No results found for this basename: PHART, PCO2ART, PO2ART,  in the last 168 hours Liver Enzymes  Recent Labs Lab 06/17/14 1655 06/22/14 0812 06/23/14 0525  AST 25 15 12   ALT 30 16 15   ALKPHOS 172*  104 99  BILITOT 0.4 0.2* <0.2*  ALBUMIN 3.1* 2.6* 2.5*   Cardiac Enzymes No results found for this basename: TROPONINI, PROBNP,  in the last 168 hours Glucose  Recent Labs Lab 06/23/14 1717 06/23/14 2007 06/23/14 2029 06/23/14 2354 06/24/14 0458 06/24/14 0825  GLUCAP 145* 129* 157* 204* 138* 212*    Imaging Dg Chest Port 1 View  06/23/2014   CLINICAL DATA:  Shortness of breath.  Tracheostomy tube.  EXAM: PORTABLE CHEST - 1 VIEW  COMPARISON:  Single view of the chest 06/21/2014 and 06/17/2014.  FINDINGS: Tracheostomy tube and right PICC remain in place. Small to moderate bilateral pleural effusions and basilar atelectasis have increased. The lungs appear emphysematous. Heart size is normal.  IMPRESSION: Increased small to moderate bilateral pleural effusions and basilar atelectasis.   Electronically Signed   By: Drusilla Kanner M.D.   On: 06/23/2014 07:33     ASSESSMENT / PLAN:  PULMONARY  Trach 04/01/14  A:  Chronic respiratory failure  COPD  Tracheostomy Status  P:  Full vent support.  Wean as able.  Albuterol PRN.   CARDIOVASCULAR  A:  Chronic Hypotension - per son, pt's SBP usually in 80's.  P:  Stress steroids. 50 mg q6hrs Hold outpatient lopressor.   RENAL  A: hypokalemia P:  Replace electrolytes as needed(repleted 8/11  IV as gut not available)  GASTROINTESTINAL  A:  PEG malfunctioning , misplaced  Bowel perforation  Severe protein calorie malnutrition   P:  Pantoprazole.  GI consulted- not a candidate for colonoscopy Seen by surgery - Dr. Dwain Sarna to discuss surgical options with Family on 8/10. Await family decision as to surgery. Placed on TPN    HEMATOLOGIC  A:  Chronic Anemia  Coagulopathy on [place] Hep P:  VTE prophylaxis: d/c Heparin 8/9 / PAS hose Transfuse for Hgb < 7.   .   INFECTIOUS  A:  Hx MRSA and enterococcus bacteremia Hx Tracheitis (cultures + for Alcaligenes bacteria)  Sacra decubitus ulcer  P:  Abx: Vanc, Cipro, Flagyl  (from [facility]).  Monitor fever curve / WBC's.  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  P:  SSI.  Stress steroids. ( 8/11 decrease to q 8 h)  NEUROLOGIC  A:  Dementia  Anxiety  P:   Fentanyl, Ativan PRN.  Hold outpatient Celexa, Clonopin, Aricept.  Monitor.     TODAY'S SUMMARY:73 y.o. F from [name] on chronic trach, brought to [facility] her PEG tube has perforated her transverse colon. She is for surgery week of  8/10. All peg and TF issues per CCS.   Brett Canales Minor ACNP [facility] PCCM Pager [phone] till 3 pm  If no answer page [phone] 06/24/2014, 10:11 AM   Attending:  I have seen and examined the patient with nurse practitioner/resident and agree with the note above.   Awaiting decision from family re surgery Apparently her [place] (Management consultantdecision maker[place] [facility] [place] [facility] [place]  I asked the patient's niece to contact him to remind him that this is an important decision that needs to be addressed now.  Heber CarolinaBrent McQuaid, MD [place] [facility] PCCM Pager: [phone] Cell: [phone] If no response, call [phone]

## 2014-06-24 NOTE — Progress Notes (Signed)
PARENTERAL NUTRITION CONSULT NOTE   Pharmacy Consult for TPN Indication: Perforated colon  Allergies  Allergen Reactions  . Namenda [Memantine Hcl] Other (See Comments)    "hallucinations"   . Codeine Other (See Comments)    Unknown. Listed on [facility]  . Penicillins Other (See Comments)    Unknown; listed on [facility]    Patient Measurements: Height: 5' 1.02" (155 cm) Weight: 137 lb 12.6 oz (62.5 kg) IBW/kg (Calculated) : 47.86 Adjusted Body Weight:  Usual Weight:   Vital Signs: Temp: 97.3 F (36.3 C) (08/12 0826) Temp src: Axillary (08/12 0826) BP: 106/45 mmHg (08/12 0826) Pulse Rate: 54 (08/12 0826) Intake/Output from previous day: 08/11 0701 - 08/12 0700 In: 4839.7 [I.V.:495.7; NG/GT:460; IV Piggyback:1900; RDE:0814] Out: 2150 [Urine:2150] Intake/Output from this shift:    Labs:  Recent Labs  06/22/14 0812 06/23/14 0525  WBC  --  9.6  HGB  --  7.9*  HCT  --  25.4*  PLT  --  313  APTT  --  26  INR 1.32 1.28     Recent Labs  06/22/14 0812 06/22/14 1311 06/23/14 0525 06/24/14 0450  NA  --  141 142 143  K  --  3.4* 3.4* 3.7  CL  --  102 103 102  CO2  --  29 32 33*  GLUCOSE  --  142* 126* 140*  BUN  --  7 10 17   CREATININE  --  0.44* 0.48* 0.42*  CALCIUM  --  8.3* 8.4 8.0*  MG  --   --  1.6 2.1  PHOS  --   --  2.9 2.8  PROT 5.7*  --  5.4*  --   ALBUMIN 2.6*  --  2.5*  --   AST 15  --  12  --   ALT 16  --  15  --   ALKPHOS 104  --  99  --   BILITOT 0.2*  --  <0.2*  --   BILIDIR <0.2  --   --   --   IBILI NOT CALCULATED  --   --   --   PREALBUMIN  --  20.9 20.6  --   TRIG  --  62 83  --    Estimated Creatinine Clearance: 54.7 ml/min (by C-G formula based on Cr of 0.42).    Recent Labs  06/23/14 2354 06/24/14 0458 06/24/14 0825  GLUCAP 204* 138* 212*    Medical History: Past Medical History  Diagnosis Date  . Hypertension   . 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 Arthritis    . Anxiety   . Cachexia 12/2013  . DM type 2 (diabetes mellitus, type 2)   . 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 Anemia 12/2013  . Dementia   . Compression fracture of lumbar vertebra, non-traumatic 12/2013    L3 and L1.     Insulin Requirements in the past 24 hours:  14 units SSI (CBGs 129-212)  Current Nutrition:  None  IVF: 45NS +K40 at 31ml/hr  Assessment: 73 y/o F from [name] with chronic trach presents with draining PEG tube found to be trans-colonic.  GI: Pt on TF at [name] with prealbumin 19.2 on 8/5. Plan to start TPN in anticipation of prolonged inability to receive TF post-op. Current prealbumin 20.9. IV PPI. Family has not made a decision about surgery yet.  Endo: DM. Glucose 129-212 on chronic steroids.   Lytes: K 3.4>>3.7.Mg  1.5>>2.1 today. IVF currently providing 9.746meq K+ daily.  Renal: Scr 0.2. UOP 1.4.  Pulm: COPD, chronic trach,  Cards: h/o HTN. BP 93/51, HR 54 currently on no CV meds.  Hepatobil: LFT's WNL. INR normal 1.28. APTT 26. Likely previous lab error. Trigly 83.  Neuro: anxiety, dementia, lumbar compression fx.   ID: Afebrile. WBC 9.6 on Vanco/Flagyl/Cipro for   Best Practices: IV PPI, SCD (Surgery defers VTE prophx decision to CCM on phone call 8/11)  TPN Access: 8/6 PICC line  TPN day#: 2  Nutritional Goals:   1400-1600 kCal, 70-80 grams of protein per day  Plan:  1. Consider resuming DVT prophylaxis as coags remain normal if no OR today--CCM decision. 2. Continue to F/u plans for surgery 3. TPN at 6260ml/hr with lipids at 7710ml/hr.(Goal) 4. IVF to 1/2 NS + K40 at kvo ml/hr (9.296meq K+/day) 5. Nutrition labs Mon/Thurs. 6. Add insulin to TPN today     Bobbyjo Marulanda S. Merilynn Finlandobertson, PharmD, BCPS Clinical Staff Pharmacist Pager [phone]  Misty Stanleyobertson, Orhan Mayorga Stillinger 06/24/2014,9:06 AM

## 2014-06-24 NOTE — Progress Notes (Signed)
Patient ID: Katelyn Rojas, female   DOB: 12-27-1940, 73 y.o.   MRN: [id number]  We had another discussion with Kathrina(niece) and a brother.  The POA [name] is currently out of town.  Beecher Mcardle will speak to [name] and try to come to a decision whether to proceed with surgery or not.  We discussed once again her options, surgical risks and so forth.  Beecher Mcardle posed the question whether [facility] would take the patient back if they decide against surgery.  In this case, she would be on TF per the G tube knowing that stool will continue to leak around it.  Social work to reach out to [name] and clarify this for [place].  RN to contact CCS if any decisions have been made.  Olanrewaju Osborn, ANP-BC

## 2014-06-24 NOTE — Progress Notes (Signed)
This RN spoke with both of the Pt's sons over the phone at this number: [phone], in the Pt's room over the speaker phone. This was done so the Pt can make a decision regarding the GI surgery. In the presence of the conversation this RN did witness the pt agree to the GI surgery. She was asked by both her sons and this RN several times, with her answer each time being "Yes" via mouthing and a head nod. Consent is signed over Phone with [name].

## 2014-06-24 NOTE — Progress Notes (Signed)
NUTRITION FOLLOW UP  DOCUMENTATION CODES Per approved criteria  -Severe malnutrition in the context of chronic illness   INTERVENTION:  TPN per pharmacy RD to follow for nutrition care plan  NUTRITION DIAGNOSIS: Inadequate oral intake related to inability to eat as evidenced by NPO status with PEG for TF, ongoing   Goal: Intake to meet >90% of estimated nutrition needs, progressing  Monitor:  TPN prescription, TF re-initiation, respiratory status, weight, labs, I/O's   ASSESSMENT: 73 y.o. F, [name] resident on chronic trach/vent, brought to ED for concerns that PEG tube had perforated her colon. Per reports, PEG has been draining chronically, some of drainage thought to be stool. Surgeon at [name] was concerned that PEG may be transversing colon into stomach. Pt was brought to ED for evaluation of "perforated viscus"; however, upon arrival to [facility] ED, this was determined to not be the case.   Per review of PTA, patient received bolus TF via PEG with Isosource 1.5 300 ml every 6 hours with free water flushes 30 ml/h to provide 1800 kcals, 82 gm protein, 1637 ml free water daily. Also took some PO's, pureed foods and nectar thick liquids.  8/10:  RD consulted for new TPN due to perforated colon.  Patient is currently on ventilator support -- trach  MV: 5.1 L/min  Temp (24hrs), Avg:98.2 F (36.8 C), Min:97.5 F (36.4 C), Max:98.9 F (37.2 C)  Patient is receiving TPN with Clinimix E 5/15 @ 40 ml/hr and lipids @ 10 ml/hr. Provides 1162 kcal and 48 grams protein per day. Meets 83% minimum estimated energy needs and 68% minimum estimated protein needs.  8/12:  Surgery notes reviewed.  Pt's family still undecided whether or not to purse surgical intervention.  Patient is receiving TPN with Clinimix E 5/15 @ 60 ml/hr and lipids @ 10 ml/hr. Provides 1502 kcal and 72 grams protein per day. Meets 100% minimum re-estimated energy needs and 80% minimum re-estimated protein  needs.  Height: Ht Readings from Last 1 Encounters:  06/20/14 5' 1.02" (1.55 m)    Weight: Wt Readings from Last 1 Encounters:  06/24/14 137 lb 12.6 oz (62.5 kg)    BMI:  Body mass index is 26.01 kg/(m^2).   Re-estimated Nutritional Needs: Kcal: 1400-1600 Protein: 90-100 gm Fluid: >/= 1.5 L  Skin: Stage IV sacral ulcer   Diet Order: NPO   Intake/Output Summary (Last 24 hours) at 06/24/14 1011 Last data filed at 06/24/14 0900  Gross per 24 hour  Intake 4884.67 ml  Output   2150 ml  Net 2734.67 ml   Labs:   Recent Labs Lab 06/17/14 1632  06/22/14 1311 06/23/14 0525 06/24/14 0450  NA 139  < > 141 142 143  K 4.2  < > 3.4* 3.4* 3.7  CL 96  < > 102 103 102  CO2 30  < > 29 32 33*  BUN 23  < > 7 10 17   CREATININE 0.50  < > 0.44* 0.48* 0.42*  CALCIUM 8.9  < > 8.3* 8.4 8.0*  MG 1.6  --   --  1.6 2.1  PHOS 4.0  --   --  2.9 2.8  GLUCOSE 71  < > 142* 126* 140*  < > = values in this interval not displayed.  CBG (last 3)   Recent Labs  06/23/14 2354 06/24/14 0458 06/24/14 0825  GLUCAP 204* 138* 212*    Scheduled Meds: . antiseptic oral rinse  7 mL Mouth Rinse QID  . chlorhexidine  15 mL Mouth  Rinse BID  . Chlorhexidine Gluconate Cloth  6 each Topical Q0600  . ciprofloxacin  400 mg Intravenous Q12H  . hydrocortisone sod succinate (SOLU-CORTEF) inj  50 mg Intravenous Q8H  . insulin aspart  0-9 Units Subcutaneous 6 times per day  . metronidazole  500 mg Intravenous Q8H  . mupirocin ointment  1 application Nasal BID  . pantoprazole (PROTONIX) IV  40 mg Intravenous Q24H  . vancomycin  1,000 mg Intravenous Q24H    Continuous Infusions: . Marland Kitchen.TPN (CLINIMIX-E) Adult 60 mL/hr at 06/23/14 2000   And  . fat emulsion 250 mL (06/23/14 2000)  . Marland Kitchen.TPN (CLINIMIX-E) Adult     And  . fat emulsion    . sodium chloride 0.45 % 1,000 mL with potassium chloride 40 mEq infusion 10 mL/hr at 06/23/14 1724    Past Medical History  Diagnosis Date  . Hypertension   . COPD  (chronic obstructive pulmonary disease)     oxygen dependent  . Arthritis   . Anxiety   . Cachexia 12/2013  . DM type 2 (diabetes mellitus, type 2)   . 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 Anemia 12/2013  . Dementia   . Compression fracture of lumbar vertebra, non-traumatic 12/2013    L3 and L1.     Past Surgical History  Procedure Laterality Date  . Vaginal hysterectomy      ? Abdominal vs. Vaginal    Maureen ChattersKatie Deneice Wack, RD, LDN Pager #: [phone] After-Hours Pager #: [phone]

## 2014-06-25 DIAGNOSIS — E876 Hypokalemia: Secondary | ICD-10-CM

## 2014-06-25 DIAGNOSIS — K631 Perforation of intestine (nontraumatic): Secondary | ICD-10-CM

## 2014-06-25 LAB — BASIC METABOLIC PANEL
Anion gap: 9 (ref 5–15)
BUN: 20 mg/dL (ref 6–23)
CALCIUM: 8.3 mg/dL — AB (ref 8.4–10.5)
CO2: 34 mEq/L — ABNORMAL HIGH (ref 19–32)
Chloride: 98 mEq/L (ref 96–112)
Creatinine, Ser: 0.41 mg/dL — ABNORMAL LOW (ref 0.50–1.10)
GFR calc non Af Amer: >90 mL/min (ref >90)
Glucose, Bld: 113 mg/dL — ABNORMAL HIGH (ref 70–99)
POTASSIUM: 3.3 meq/L — AB (ref 3.7–5.3)
SODIUM: 141 meq/L (ref 137–147)

## 2014-06-25 LAB — COMPREHENSIVE METABOLIC PANEL
ALK PHOS: 105 U/L (ref 39–117)
ALT: 11 U/L (ref 0–35)
AST: 16 U/L (ref 0–37)
Albumin: 2.7 g/dL — ABNORMAL LOW (ref 3.5–5.2)
Anion gap: 12 (ref 5–15)
BUN: 21 mg/dL (ref 6–23)
CHLORIDE: 98 meq/L (ref 96–112)
CO2: 32 mEq/L (ref 19–32)
Calcium: 8.3 mg/dL — ABNORMAL LOW (ref 8.4–10.5)
Creatinine, Ser: 0.4 mg/dL — ABNORMAL LOW (ref 0.50–1.10)
GLUCOSE: 144 mg/dL — AB (ref 70–99)
POTASSIUM: 2.9 meq/L — AB (ref 3.7–5.3)
Sodium: 142 mEq/L (ref 137–147)
Total Bilirubin: <0.2 mg/dL — ABNORMAL LOW (ref 0.3–1.2)
Total Protein: 6.1 g/dL (ref 6.0–8.3)

## 2014-06-25 LAB — GLUCOSE, CAPILLARY
GLUCOSE-CAPILLARY: 188 mg/dL — AB (ref 70–99)
GLUCOSE-CAPILLARY: 139 mg/dL — AB (ref 70–99)
Glucose-Capillary: 145 mg/dL — ABNORMAL HIGH (ref 70–99)
Glucose-Capillary: 153 mg/dL — ABNORMAL HIGH (ref 70–99)
Glucose-Capillary: 158 mg/dL — ABNORMAL HIGH (ref 70–99)
Glucose-Capillary: 169 mg/dL — ABNORMAL HIGH (ref 70–99)

## 2014-06-25 LAB — CBC
HCT: 32.9 % — ABNORMAL LOW (ref 36.0–46.0)
Hemoglobin: 10.2 g/dL — ABNORMAL LOW (ref 12.0–15.0)
MCH: 30.7 pg (ref 26.0–34.0)
MCHC: 31 g/dL (ref 30.0–36.0)
MCV: 99.1 fL (ref 78.0–100.0)
Platelets: 398 10*3/uL (ref 150–400)
RBC: 3.32 MIL/uL — ABNORMAL LOW (ref 3.87–5.11)
RDW: 15.4 % (ref 11.5–15.5)
WBC: 17.6 10*3/uL — ABNORMAL HIGH (ref 4.0–10.5)

## 2014-06-25 LAB — PHOSPHORUS: Phosphorus: 2.8 mg/dL (ref 2.3–4.6)

## 2014-06-25 LAB — MAGNESIUM: Magnesium: 1.7 mg/dL (ref 1.5–2.5)

## 2014-06-25 MED ORDER — METRONIDAZOLE IN NACL 5-0.79 MG/ML-% IV SOLN
500.0000 mg | Freq: Once | INTRAVENOUS | Status: DC
Start: 2014-06-25 — End: 2014-06-29
  Filled 2014-06-25: qty 100

## 2014-06-25 MED ORDER — POTASSIUM CHLORIDE 10 MEQ/50ML IV SOLN
10.0000 meq | INTRAVENOUS | Status: AC
  Administered 2014-06-25 (×3): 10 meq via INTRAVENOUS
  Filled 2014-06-25 (×2): qty 50

## 2014-06-25 MED ORDER — TRACE MINERALS CR-CU-F-FE-I-MN-MO-SE-ZN IV SOLN
INTRAVENOUS | Status: AC
  Administered 2014-06-25: 18:00:00 via INTRAVENOUS
  Filled 2014-06-25: qty 2000

## 2014-06-25 MED ORDER — FAT EMULSION 20 % IV EMUL
250.0000 mL | INTRAVENOUS | Status: AC
  Administered 2014-06-25: 250 mL via INTRAVENOUS
  Filled 2014-06-25: qty 250

## 2014-06-25 MED ORDER — MAGNESIUM SULFATE 40 MG/ML IJ SOLN
2.0000 g | Freq: Once | INTRAMUSCULAR | Status: AC
  Administered 2014-06-25: 2 g via INTRAVENOUS
  Filled 2014-06-25: qty 50

## 2014-06-25 NOTE — Progress Notes (Signed)
Trach consult performed. Pt is currently awaiting GI surgery. No weaning at this time. Pt has backup trach & all emergency equipment at bedside. No education needed at this time. Plan is to D/C back to [name] if PEG tube is fixed & not draining stool anymore.  Jacqulynn Cadet RRT

## 2014-06-25 NOTE — Consult Note (Addendum)
WOC ostomy consult note: Consult requested for colostomy stoma site marking; CCS team  Indicates plans for surgery tomorrow.  Pt is bedridden on vent with limited mobility.  Assessed abd while laying flat and putting HOB upright as far as possible.  Mark placed within rectus muscles, in area free from folds.  Daughter at bedside states pt wears her pants just below the umbilicus line when at home.  Stoma site marked in left upper quad to avoid a crease in the abd area which was located lower; site is 3 cm above umbilicus and 10 cm to the left.  Will plan to follow for post-op teaching after surgery when stable. Educational materials left at bedside.  Pt cannot ask questions R/T the vent.  Briefly discussed pouching routines and demonstrated pouch appearance, daughter denies further questions at this time. Cammie Mcgee MSN, RN, CWOCN, [place], CNS [phone]

## 2014-06-25 NOTE — Progress Notes (Signed)
Pt was noted to have labored breathing and diaphoretic. Suctioned pt and thick white secretions returned. Pt had mild anxiety noted. Oxygen sat of 83% on the monitor. Respiratory in to see pt. Medicated pt with ativan per suggestion of respiratory therapy. Tolerated treatment well. Family at bedside.

## 2014-06-25 NOTE — Progress Notes (Signed)
Patient ID: Katelyn Rojas, female   DOB: 02-Oct-1941, 73 y.o.   MRN: [id number] Her son Katelyn Rojas has agreed to proceed with surgery, will plan on elap, colectomy with colostomy, new gastrostomy tube.  I have outlined high risk of morbidity and mortality previously.

## 2014-06-25 NOTE — Trach Care Team (Signed)
Trach Care Progression Note   Patient Details Name: Katelyn Rojas MRN: [id number] DOB: [birth year] Today's Date: 06/25/2014   Tracheostomy Assessment    Tracheostomy Shiley 6 mm Cuffed (Active)  Status Secured 06/25/2014 11:35 AM  Site Assessment Clean;Dry 06/25/2014 11:35 AM  Site Care Dressing applied;Dried 06/25/2014 11:35 AM  Inner Cannula Care Changed/new 06/25/2014  6:30 AM  Ties Assessment Clean;Dry;Secure;Changed 06/25/2014 11:35 AM  Cuff pressure (cm) 50 cm 06/25/2014  8:13 AM  Emergency Equipment at bedside Yes 06/25/2014 11:35 AM     Care Needs     Respiratory Therapy Tracheostomy: Chronic trach O2 Device: Ventilator FiO2 (%): 30 % SpO2: 98 % Education: Not applicable Respiratory barriers to progression: Other (comment) (vent dependent)    Speech Language Pathology  SLP chart review complete: Patient not ready for SLP services   Physical Therapy      Occupational Therapy      Nutritional [name] Current Diet: NPO    Case Management/Social Work Level of patient care prior to hospitalization: SNF/Assisted  living facility ([name]) Insurance payer: Medicare Barriers to progression: none really-chronic trach/OBV status Plan to address barriers: colonsocopy pending- perfed viscus ruled out Anticipated discharge disposition: SNF/Assisted  living Industrial/product designer Care Team Recommendations [facility] Team Members -  [place], SLP,  Gretta Cool, SW, [address], [place] & Cherylin Mylar, RT    None; Abd surgery planned          Ellina Sivertsen, Silva Bandy (scribe for team) 06/25/2014, 2:26 PM

## 2014-06-25 NOTE — Progress Notes (Addendum)
Bagged and lavaged trach-sxn trach sm white/yellow thick

## 2014-06-25 NOTE — Progress Notes (Signed)
PARENTERAL NUTRITION CONSULT NOTE   Pharmacy Consult for TPN Indication: Perforated colon  Allergies  Allergen Reactions  . Namenda [Memantine Hcl] Other (See Comments)    "hallucinations"   . Codeine Other (See Comments)    Unknown. Listed on [name]  . Penicillins Other (See Comments)    Unknown; listed on [facility]    Patient Measurements: Height: 5' 1.02" (155 cm) Weight: 137 lb 12.6 oz (62.5 kg) IBW/kg (Calculated) : 47.86 Adjusted Body Weight:  Usual Weight:   Vital Signs: Temp: 97.7 F (36.5 C) (08/13 0804) Temp src: Oral (08/13 0804) BP: 123/55 mmHg (08/13 0813) Pulse Rate: 60 (08/13 0813) Intake/Output from previous day: 08/12 0701 - 08/13 0700 In: 1760 [I.V.:120; IV Piggyback:800; TPN:840] Out: 1850 [Urine:1850] Intake/Output from this shift:    Labs:  Recent Labs  06/23/14 0525  WBC 9.6  HGB 7.9*  HCT 25.4*  PLT 313  APTT 26  INR 1.28     Recent Labs  06/22/14 1311 06/23/14 0525 06/24/14 0450 06/25/14 0600  NA 141 142 143 142  K 3.4* 3.4* 3.7 2.9*  CL 102 103 102 98  CO2 29 32 33* 32  GLUCOSE 142* 126* 140* 144*  BUN 7 10 17 21   CREATININE 0.44* 0.48* 0.42* 0.40*  CALCIUM 8.3* 8.4 8.0* 8.3*  MG  --  1.6 2.1 1.7  PHOS  --  2.9 2.8 2.8  PROT  --  5.4*  --  6.1  ALBUMIN  --  2.5*  --  2.7*  AST  --  12  --  16  ALT  --  15  --  11  ALKPHOS  --  99  --  105  BILITOT  --  <0.2*  --  <0.2*  PREALBUMIN 20.9 20.6  --   --   TRIG 62 83  --   --    Estimated Creatinine Clearance: 54.7 ml/min (by C-G formula based on Cr of 0.4).    Recent Labs  06/25/14 0013 06/25/14 0353 06/25/14 0812  GLUCAP 145* 169* 188*    Medical History: Past Medical History  Diagnosis Date  . Hypertension   . 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 Arthritis   . Anxiety   . Cachexia 12/2013  . DM type 2 (diabetes mellitus, type 2)   . 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 Anemia 12/2013  .  Dementia   . Compression fracture of lumbar vertebra, non-traumatic 12/2013    L3 and L1.     Insulin Requirements in the past 24 hours:  11 units SSI (CBGs 116-212)  Current Nutrition:  None  IVF: 45NS +K40 at 15ml/hr (9.6 meq/day K+)  Assessment: 73 y/o F from [name] with chronic trach presents with draining PEG tube found to be trans-colonic.  GI: Pt on TF at [name] with prealbumin 19.2 on 8/5. Plan to start TPN in anticipation of prolonged inability to receive TF post-op. Current prealbumin 20.9. IV PPI. Now plan surgery.  Endo: DM. Glucose [id number] on chronic steroids. Currently on stress dose IV HC. Increase insulin in TPN.  Lytes: K+ down to 2.9. Mg 1.7 today. IVF currently providing 9.51meq K+ daily. Refeeding. Kruns x 6 ordered by MD.  Renal: Scr 0.4. UOP 1.2  Pulm: COPD, chronic trach on full vent support.  Cards: h/o HTN. BP 123/55, HR 60 currently on no CV meds.  Hepatobil: LFT's WNL. INR normal 1.28. APTT 26. Likely previous lab  error. Trigly 83.  Neuro: anxiety, dementia, lumbar compression fx.   ID: Afebrile. WBC 9.6 on Vanco/Flagyl/Cipro (all since 8/4) for intraabdominal process   Best Practices: IV PPI, SCD (Surgery defers VTE prophx decision to CCM on phone call 8/11)  TPN Access: 8/6 PICC line  TPN day#: 3  Nutritional Goals:   1400-1600 kCal, 90-10 grams of protein per day (goal change per RD note 8/12)  Plan:  1. Change Clinimix 5/15 to meet new RD goal at 7080ml/hr and decrease lipids to 575ml/hr. 2. F/u after surgery. 3. Kruns x 6 today. Recheck this PM 4. MagSulfate 2g IV x 1. 5. Increase insulin in TPN again.    Kanai Hilger S. Merilynn Finlandobertson, PharmD, BCPS Clinical Staff Pharmacist Pager [phone]  Misty Stanleyobertson, Idelle Reimann Stillinger 06/25/2014,9:00 AM

## 2014-06-25 NOTE — Progress Notes (Signed)
PULMONARY / CRITICAL CARE MEDICINE   Name: Katelyn Rojas MRN: [id number] DOB: 03/13/1941    ADMISSION DATE:  06/17/2014 CONSULTATION DATE:  06/17/14  REFERRING MD :  [name] >> ED  CHIEF COMPLAINT: ? Perforated viscus   INITIAL PRESENTATION: 73 y.o. F, [name] resident on chronic trach, brought to ED for concerns that PEG tube had perforated her colon. Per reports, PEG has been draining chronically, some of drainage thought to be stool. Surgeon at [name] was concerned that PEG may be transversing colon into stomach. Pt was brought to ED for evaluation of "perforated viscus"; however, upon [facility] to [facility] ED, this was determined to not be the case. PCCM was called for admission.   STUDIES:  8/5 KUB >>> no free air, generalized bowel distention, stable gastrostomy.  8/6 >> No surgery needed, IR to evaluate the G-tube.  8/6 >> BE shows Gastrostomy tube traverses distal transverse segment. Some contrast noted to exit at skin entry site along G tube. No obstruction   SIGNIFICANT EVENTS:  8/5 admit >>> transferred from [name], concerns of perforated viscus, [facility] suggested this to not be the case.  8/7>> Surgery discussing with family about revision of PEG tube, Dr. Dwain SarnaWakeField to meet with family on 8/10 to discuss surgical options. Plan to go to or 8/10 possibly.  8/10 TPN started. Surgery planning on to OR soon. 8/12 family has not decided about surgery yet. 8/13 apparently family has decided to proceed with surgery  SUBJECTIVE:  No acute events  VITAL SIGNS: Temp:  [97.7 F (36.5 C)-98.3 F (36.8 C)] 97.7 F (36.5 C) (08/13 0804) Pulse Rate:  [49-90] 60 (08/13 0813) Resp:  [13-15] 14 (08/13 0804) BP: (106-149)/(47-72) 123/55 mmHg (08/13 0813) SpO2:  [95 %-100 %] 99 % (08/13 0813) FiO2 (%):  [30 %] 30 % (08/13 0813) Weight:  [62.5 kg (137 lb 12.6 oz)] 62.5 kg (137 lb 12.6 oz) (08/13 0406) HEMODYNAMICS:   VENTILATOR SETTINGS: Vent Mode:  [-] PRVC FiO2 (%):  [30 %] 30  % Set Rate:  [14 bmp] 14 bmp Vt Set:  [400 mL] 400 mL PEEP:  [5 cmH20] 5 cmH20 Plateau Pressure:  [15 cmH20-20 cmH20] 15 cmH20 INTAKE / OUTPUT:  Intake/Output Summary (Last 24 hours) at 06/25/14 1004 Last data filed at 06/25/14 0610  Gross per 24 hour  Intake   1460 ml  Output   1850 ml  Net   -390 ml    PHYSICAL EXAMINATION: General: chronically ill appearing on vent HEENT: NCAT, trach in place PULM: diminished bilaterally CV: RRR, no mgr AB: BS infrequent, soft, PEG Ext: contractures, edema Neuro: awake, communicates appropriately   LABS:  CBC  Recent Labs Lab 06/19/14 1100 06/21/14 0119 06/23/14 0525  WBC 10.0 9.4 9.6  HGB 8.0* 7.5* 7.9*  HCT 26.5* 24.3* 25.4*  PLT 341 300 313   Coag's  Recent Labs Lab 06/21/14 0119 06/22/14 0812 06/23/14 0525  APTT >200*  --  26  INR 2.50* 1.32 1.28   BMET  Recent Labs Lab 06/23/14 0525 06/24/14 0450 06/25/14 0600  NA 142 143 142  K 3.4* 3.7 2.9*  CL 103 102 98  CO2 32 33* 32  BUN 10 17 21   CREATININE 0.48* 0.42* 0.40*  GLUCOSE 126* 140* 144*   Electrolytes  Recent Labs Lab 06/23/14 0525 06/24/14 0450 06/25/14 0600  CALCIUM 8.4 8.0* 8.3*  MG 1.6 2.1 1.7  PHOS 2.9 2.8 2.8   Sepsis Markers No results found for this basename: LATICACIDVEN, PROCALCITON,  O2SATVEN,  in the last 168 hours ABG No results found for this basename: PHART, PCO2ART, PO2ART,  in the last 168 hours Liver Enzymes  Recent Labs Lab 06/22/14 0812 06/23/14 0525 06/25/14 0600  AST 15 12 16   ALT 16 15 11   ALKPHOS 104 99 105  BILITOT 0.2* <0.2* <0.2*  ALBUMIN 2.6* 2.5* 2.7*   Cardiac Enzymes No results found for this basename: TROPONINI, PROBNP,  in the last 168 hours Glucose  Recent Labs Lab 06/24/14 1157 06/24/14 1632 06/24/14 2017 06/25/14 0013 06/25/14 0353 06/25/14 0812  GLUCAP 128* 169* 116* 145* 169* 188*    Imaging No results found.   ASSESSMENT / PLAN:  PULMONARY  Trach 04/01/14  A:  Chronic  respiratory failure  COPD  Tracheostomy Status  P:  Full vent support.  No weaning Albuterol PRN.  Pulm toilette > bag lavage  CARDIOVASCULAR  A:  Chronic Hypotension - per son, pt's SBP usually in 80's.  P:  Stress steroids. 50 mg q6hrs Hold outpatient lopressor.   RENAL  A: hypokalemia P:  Replace electrolytes as needed repleted 8/13 IV  GASTROINTESTINAL  A:  PEG malfunctioning , misplaced  Bowel perforation  Severe protein calorie malnutrition   P:  Pantoprazole.  Family has decided "yes" to surgery, will make sure surgery knows Placed on TPN   HEMATOLOGIC  A:  Chronic Anemia  Coagulopathy on [place] Hep P:  VTE prophylaxis: d/c Heparin 8/9 / PAS hose Transfuse for Hgb < 7.     INFECTIOUS  A:  Hx MRSA and enterococcus bacteremia Hx Tracheitis (cultures + for Alcaligenes bacteria)  Sacra decubitus ulcer  P:  Abx: Vanc, Cipro, Flagyl (from [facility]).  Monitor fever curve / WBC's.  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  P:  SSI.  Stress steroids. ( 8/11 decrease to q 8 h)  NEUROLOGIC  A:  Dementia  Anxiety  P:   Fentanyl, Ativan PRN.  Hold outpatient Celexa, Clonopin, Aricept.  Monitor.     TODAY'S SUMMARY:73 y.o. F from [name] on chronic trach, brought to [facility] her PEG tube has perforated her transverse colon. FAMILY OK WITH PROCEEDING WITH SURGERY. All peg and TF issues per [facility].    Heber [place], MD  PCCM Pager: [phone] Cell: [phone] If no response, call [phone]

## 2014-06-25 NOTE — Progress Notes (Signed)
ANTIBIOTIC CONSULT NOTE - FOLLOW UP  Pharmacy Consult for Vancomycin, Cipro Indication: Possible intra-abdominal infection/cellulitis  Allergies  Allergen Reactions  . Namenda [Memantine Hcl] Other (See Comments)    "hallucinations"   . Codeine Other (See Comments)    Unknown. Listed on [facility]  . Penicillins Other (See Comments)    Unknown; listed on [facility]    Patient Measurements: Height: 5' 1.02" (155 cm) Weight: 137 lb 12.6 oz (62.5 kg) IBW/kg (Calculated) : 47.86 Vital Signs: Temp: 97.9 F (36.6 C) (08/13 0406) Temp src: Axillary (08/13 0406) BP: 106/54 mmHg (08/13 0406) Pulse Rate: 49 (08/13 0326) Intake/Output from previous day: 08/12 0701 - 08/13 0700 In: 1760 [I.V.:120; IV Piggyback:800; TPN:840] Out: 1850 [Urine:1850] Intake/Output from this shift:   Labs:  Recent Labs  06/23/14 0525 06/24/14 0450 06/25/14 0600  WBC 9.6  --   --   HGB 7.9*  --   --   PLT 313  --   --   CREATININE 0.48* 0.42* 0.40*   Estimated Creatinine Clearance: 54.7 ml/min (by C-G formula based on Cr of 0.4). No results found for this basename: VANCOTROUGH, Leodis Binet, VANCORANDOM, GENTTROUGH, GENTPEAK, GENTRANDOM, TOBRATROUGH, TOBRAPEAK, TOBRARND, AMIKACINPEAK, AMIKACINTROU, AMIKACIN,  in the last 72 hours   Microbiology: Recent Results (from the past 720 hour(s))  MRSA PCR SCREENING     Status: Abnormal   Collection Time    06/17/14  6:28 PM      Result Value Ref Range Status   MRSA by PCR POSITIVE (*) NEGATIVE Final   Comment:            The GeneXpert MRSA Assay (FDA     approved for NASAL specimens     only), is one component of a     comprehensive MRSA colonization     surveillance program. It is not     intended to diagnose MRSA     infection nor to guide or     monitor treatment for     MRSA infections.     RESULT CALLED TO, READ BACK BY AND VERIFIED WITH:     A BECK RN 2031 06/17/14 A BROWNING    Anti-infectives   Start     Dose/Rate  Route Frequency Ordered Stop   06/18/14 0600  vancomycin (VANCOCIN) IVPB 1000 mg/200 mL premix     1,000 mg 200 mL/hr over 60 Minutes Intravenous Every 24 hours 06/17/14 2234     06/17/14 2100  ciprofloxacin (CIPRO) IVPB 400 mg     400 mg 200 mL/hr over 60 Minutes Intravenous Every 12 hours 06/17/14 1816     06/17/14 1700  metroNIDAZOLE (FLAGYL) IVPB 500 mg     500 mg 100 mL/hr over 60 Minutes Intravenous Every 8 hours 06/17/14 1658       Assessment: 73 YOF on day # 10 of vancomycin, Cipro, and Flagyl awaiting surgery for possible intra-abdominal perf with cellulitis and sacral decubitus ulcer. Trough has been therapeutic on vancomycin 1g IV q24h. SCr has remained stable however difficult to interpret given low muscle mass of patient. Patient is making decent urine at 1.2 cc/kg/hr. Patient remains afebrile and WBC within normal limits - last checked on 8/11. No cultures available.   Goal of Therapy:  Vancomycin trough level 15-20 mcg/ml  Plan:  1. Continue vancomycin 1g IV q24 - weekly vancomycin trough (next on Sunday 8/16) 2. Continue Cipro 400mg  IV q12h. 3. Follow-up surgical plans and length of therapy.   Link Snuffer, PharmD,  BCPS Clinical Pharmacist [phone] 06/25/2014,7:54 AM

## 2014-06-26 ENCOUNTER — Encounter (HOSPITAL_COMMUNITY): Payer: Self-pay | Admitting: Certified Registered Nurse Anesthetist

## 2014-06-26 ENCOUNTER — Inpatient Hospital Stay (HOSPITAL_COMMUNITY): Payer: Medicare Other | Admitting: Certified Registered Nurse Anesthetist

## 2014-06-26 ENCOUNTER — Encounter (HOSPITAL_COMMUNITY): Payer: Medicare Other | Admitting: Certified Registered Nurse Anesthetist

## 2014-06-26 ENCOUNTER — Encounter (HOSPITAL_COMMUNITY)
Admission: EM | Disposition: A | Discharge status: Nursing Home (Includes ICF) | Payer: Self-pay | Source: Home / Self Care | Attending: Internal Medicine

## 2014-06-26 DIAGNOSIS — R1013 Epigastric pain: Secondary | ICD-10-CM

## 2014-06-26 HISTORY — PX: GASTROSTOMY: SHX5249

## 2014-06-26 HISTORY — PX: LAPAROTOMY: SHX154

## 2014-06-26 HISTORY — PX: PARTIAL COLECTOMY: SHX5273

## 2014-06-26 LAB — TYPE AND SCREEN
ABO/RH(D): A POS
ANTIBODY SCREEN: NEGATIVE

## 2014-06-26 LAB — BASIC METABOLIC PANEL
Anion gap: 9 (ref 5–15)
BUN: 22 mg/dL (ref 6–23)
CALCIUM: 8.3 mg/dL — AB (ref 8.4–10.5)
CO2: 36 meq/L — AB (ref 19–32)
CREATININE: 0.41 mg/dL — AB (ref 0.50–1.10)
Chloride: 101 mEq/L (ref 96–112)
GFR calc Af Amer: >90 mL/min (ref >90)
GFR calc non Af Amer: >90 mL/min (ref >90)
Glucose, Bld: 126 mg/dL — ABNORMAL HIGH (ref 70–99)
Potassium: 3.4 mEq/L — ABNORMAL LOW (ref 3.7–5.3)
SODIUM: 146 meq/L (ref 137–147)

## 2014-06-26 LAB — GLUCOSE, CAPILLARY
Glucose-Capillary: 128 mg/dL — ABNORMAL HIGH (ref 70–99)
Glucose-Capillary: 141 mg/dL — ABNORMAL HIGH (ref 70–99)
Glucose-Capillary: 154 mg/dL — ABNORMAL HIGH (ref 70–99)
Glucose-Capillary: 167 mg/dL — ABNORMAL HIGH (ref 70–99)
Glucose-Capillary: 176 mg/dL — ABNORMAL HIGH (ref 70–99)
Glucose-Capillary: 273 mg/dL — ABNORMAL HIGH (ref 70–99)

## 2014-06-26 LAB — PROTIME-INR
INR: 1.21 (ref 0.00–1.49)
PROTHROMBIN TIME: 15.3 s — AB (ref 11.6–15.2)

## 2014-06-26 LAB — SURGICAL PCR SCREEN
MRSA, PCR: POSITIVE — AB
Staphylococcus aureus: POSITIVE — AB

## 2014-06-26 SURGERY — LAPAROTOMY, EXPLORATORY
Anesthesia: General | Site: Abdomen

## 2014-06-26 MED ORDER — VECURONIUM BROMIDE 10 MG IV SOLR
INTRAVENOUS | Status: AC
  Filled 2014-06-26: qty 10

## 2014-06-26 MED ORDER — FAT EMULSION 20 % IV EMUL
120.0000 mL | INTRAVENOUS | Status: AC
  Administered 2014-06-26: 120 mL via INTRAVENOUS
  Filled 2014-06-26: qty 200

## 2014-06-26 MED ORDER — SUCCINYLCHOLINE CHLORIDE 20 MG/ML IJ SOLN
INTRAMUSCULAR | Status: AC
  Filled 2014-06-26: qty 1

## 2014-06-26 MED ORDER — ONDANSETRON HCL 4 MG/2ML IJ SOLN
INTRAMUSCULAR | Status: AC
  Filled 2014-06-26: qty 2

## 2014-06-26 MED ORDER — LACTATED RINGERS IV SOLN
INTRAVENOUS | Status: DC | PRN
  Administered 2014-06-26: 09:00:00 via INTRAVENOUS

## 2014-06-26 MED ORDER — PROPOFOL 10 MG/ML IV BOLUS
INTRAVENOUS | Status: AC
  Filled 2014-06-26: qty 20

## 2014-06-26 MED ORDER — MIDAZOLAM HCL 2 MG/2ML IJ SOLN
INTRAMUSCULAR | Status: AC
  Filled 2014-06-26: qty 2

## 2014-06-26 MED ORDER — NEOSTIGMINE METHYLSULFATE 10 MG/10ML IV SOLN
INTRAVENOUS | Status: AC
  Filled 2014-06-26: qty 1

## 2014-06-26 MED ORDER — FENTANYL CITRATE 0.05 MG/ML IJ SOLN
INTRAMUSCULAR | Status: AC
  Filled 2014-06-26: qty 5

## 2014-06-26 MED ORDER — 0.9 % SODIUM CHLORIDE (POUR BTL) OPTIME
TOPICAL | Status: DC | PRN
  Administered 2014-06-26 (×2): 1000 mL

## 2014-06-26 MED ORDER — VECURONIUM BROMIDE 10 MG IV SOLR
INTRAVENOUS | Status: DC | PRN
  Administered 2014-06-26: 3 mg via INTRAVENOUS

## 2014-06-26 MED ORDER — GLYCOPYRROLATE 0.2 MG/ML IJ SOLN
INTRAMUSCULAR | Status: AC
  Filled 2014-06-26: qty 3

## 2014-06-26 MED ORDER — PHENYLEPHRINE HCL 10 MG/ML IJ SOLN
INTRAMUSCULAR | Status: AC
  Filled 2014-06-26: qty 1

## 2014-06-26 MED ORDER — MORPHINE SULFATE 4 MG/ML IJ SOLN
4.0000 mg | INTRAMUSCULAR | Status: DC | PRN
  Administered 2014-06-26 – 2014-06-30 (×15): 4 mg via INTRAVENOUS
  Filled 2014-06-26 (×15): qty 1

## 2014-06-26 MED ORDER — ROCURONIUM BROMIDE 50 MG/5ML IV SOLN
INTRAVENOUS | Status: AC
  Filled 2014-06-26: qty 1

## 2014-06-26 MED ORDER — LIDOCAINE HCL (CARDIAC) 20 MG/ML IV SOLN
INTRAVENOUS | Status: AC
  Filled 2014-06-26: qty 5

## 2014-06-26 MED ORDER — MORPHINE SULFATE 2 MG/ML IJ SOLN
2.0000 mg | Freq: Once | INTRAMUSCULAR | Status: AC
  Administered 2014-06-26: 2 mg via INTRAVENOUS
  Filled 2014-06-26: qty 1

## 2014-06-26 MED ORDER — ARTIFICIAL TEARS OP OINT
TOPICAL_OINTMENT | OPHTHALMIC | Status: DC | PRN
  Administered 2014-06-26: 1 via OPHTHALMIC

## 2014-06-26 MED ORDER — EPHEDRINE SULFATE 50 MG/ML IJ SOLN
INTRAMUSCULAR | Status: AC
  Filled 2014-06-26: qty 1

## 2014-06-26 MED ORDER — FENTANYL CITRATE 0.05 MG/ML IJ SOLN
INTRAMUSCULAR | Status: DC | PRN
  Administered 2014-06-26 (×2): 100 ug via INTRAVENOUS
  Administered 2014-06-26: 50 ug via INTRAVENOUS

## 2014-06-26 MED ORDER — POTASSIUM CHLORIDE 10 MEQ/50ML IV SOLN
10.0000 meq | INTRAVENOUS | Status: AC
  Administered 2014-06-26 (×3): 10 meq via INTRAVENOUS
  Filled 2014-06-26: qty 50

## 2014-06-26 MED ORDER — ARTIFICIAL TEARS OP OINT
TOPICAL_OINTMENT | OPHTHALMIC | Status: AC
  Filled 2014-06-26: qty 3.5

## 2014-06-26 MED ORDER — TRACE MINERALS CR-CU-F-FE-I-MN-MO-SE-ZN IV SOLN
INTRAVENOUS | Status: AC
  Administered 2014-06-26: 18:00:00 via INTRAVENOUS
  Filled 2014-06-26: qty 2000

## 2014-06-26 MED ORDER — MIDAZOLAM HCL 5 MG/5ML IJ SOLN
INTRAMUSCULAR | Status: DC | PRN
  Administered 2014-06-26: 2 mg via INTRAVENOUS

## 2014-06-26 MED ORDER — SODIUM CHLORIDE 0.9 % IJ SOLN
INTRAMUSCULAR | Status: AC
  Filled 2014-06-26: qty 10

## 2014-06-26 MED ORDER — ROCURONIUM BROMIDE 100 MG/10ML IV SOLN
INTRAVENOUS | Status: DC | PRN
  Administered 2014-06-26: 50 mg via INTRAVENOUS

## 2014-06-26 MED ORDER — PHENYLEPHRINE HCL 10 MG/ML IJ SOLN
10.0000 mg | INTRAVENOUS | Status: DC | PRN
  Administered 2014-06-26: 10 ug/min via INTRAVENOUS

## 2014-06-26 SURGICAL SUPPLY — 73 items
BLADE SURG ROTATE 9660 (MISCELLANEOUS) IMPLANT
CANISTER SUCTION 2500CC (MISCELLANEOUS) ×3 IMPLANT
CATH ROBINSON RED A/P 16FR (CATHETERS) IMPLANT
CATH ROBINSON RED A/P 18FR (CATHETERS) IMPLANT
CATH ROBINSON RED A/P 20FR (CATHETERS) IMPLANT
CHLORAPREP W/TINT 26ML (MISCELLANEOUS) IMPLANT
COVER MAYO STAND STRL (DRAPES) ×3 IMPLANT
COVER SURGICAL LIGHT HANDLE (MISCELLANEOUS) ×3 IMPLANT
DRAPE LAPAROSCOPIC ABDOMINAL (DRAPES) ×3 IMPLANT
DRAPE PROXIMA HALF (DRAPES) IMPLANT
DRAPE UTILITY 15X26 W/TAPE STR (DRAPE) ×6 IMPLANT
DRAPE WARM FLUID 44X44 (DRAPE) ×3 IMPLANT
DRSG OPSITE POSTOP 4X10 (GAUZE/BANDAGES/DRESSINGS) IMPLANT
DRSG OPSITE POSTOP 4X8 (GAUZE/BANDAGES/DRESSINGS) IMPLANT
ELECT BLADE 6.5 EXT (BLADE) ×3 IMPLANT
ELECT CAUTERY BLADE 6.4 (BLADE) ×3 IMPLANT
ELECT REM PT RETURN 9FT ADLT (ELECTROSURGICAL) ×3
ELECTRODE REM PT RTRN 9FT ADLT (ELECTROSURGICAL) ×1 IMPLANT
GAUZE SPONGE 4X4 12PLY STRL (GAUZE/BANDAGES/DRESSINGS) IMPLANT
GLOVE BIO SURGEON STRL SZ7 (GLOVE) ×6 IMPLANT
GLOVE BIOGEL PI IND STRL 7.0 (GLOVE) ×2 IMPLANT
GLOVE BIOGEL PI IND STRL 7.5 (GLOVE) ×1 IMPLANT
GLOVE BIOGEL PI INDICATOR 7.0 (GLOVE) ×4
GLOVE BIOGEL PI INDICATOR 7.5 (GLOVE) ×2
GLOVE SURG SS PI 7.0 STRL IVOR (GLOVE) ×3 IMPLANT
GOWN STRL REUS W/ TWL LRG LVL3 (GOWN DISPOSABLE) ×3 IMPLANT
GOWN STRL REUS W/TWL LRG LVL3 (GOWN DISPOSABLE) ×6
KIT BASIN OR (CUSTOM PROCEDURE TRAY) ×3 IMPLANT
KIT OSTOMY DRAINABLE 2.75 STR (WOUND CARE) ×3 IMPLANT
KIT ROOM TURNOVER OR (KITS) ×3 IMPLANT
LEGGING LITHOTOMY PAIR STRL (DRAPES) IMPLANT
LIGASURE IMPACT 36 18CM CVD LR (INSTRUMENTS) ×3 IMPLANT
NS IRRIG 1000ML POUR BTL (IV SOLUTION) ×6 IMPLANT
PACK GENERAL/GYN (CUSTOM PROCEDURE TRAY) ×3 IMPLANT
PAD ARMBOARD 7.5X6 YLW CONV (MISCELLANEOUS) ×9 IMPLANT
PENCIL BUTTON HOLSTER BLD 10FT (ELECTRODE) IMPLANT
RELOAD PROXIMATE 75MM BLUE (ENDOMECHANICALS) ×9 IMPLANT
SPECIMEN JAR LARGE (MISCELLANEOUS) IMPLANT
SPONGE GAUZE 4X4 12PLY STER LF (GAUZE/BANDAGES/DRESSINGS) ×3 IMPLANT
SPONGE LAP 18X18 X RAY DECT (DISPOSABLE) ×3 IMPLANT
STAPLER PROXIMATE 75MM BLUE (STAPLE) ×3 IMPLANT
STAPLER VISISTAT 35W (STAPLE) ×3 IMPLANT
SUCTION POOLE TIP (SUCTIONS) ×3 IMPLANT
SURGILUBE 2OZ TUBE FLIPTOP (MISCELLANEOUS) IMPLANT
SUT ETHILON 2 0 FS 18 (SUTURE) IMPLANT
SUT ETHILON 3 0 FSL (SUTURE) ×3 IMPLANT
SUT PDS AB 1 TP1 96 (SUTURE) ×6 IMPLANT
SUT PROLENE 2 0 CT2 30 (SUTURE) IMPLANT
SUT PROLENE 2 0 KS (SUTURE) IMPLANT
SUT SILK 2 0 (SUTURE)
SUT SILK 2 0 SH CR/8 (SUTURE) ×6 IMPLANT
SUT SILK 2 0 TIES 10X30 (SUTURE) ×3 IMPLANT
SUT SILK 2-0 18XBRD TIE 12 (SUTURE) IMPLANT
SUT SILK 3 0 (SUTURE)
SUT SILK 3 0 SH CR/8 (SUTURE) ×3 IMPLANT
SUT SILK 3 0 TIES 10X30 (SUTURE) ×3 IMPLANT
SUT SILK 3-0 18XBRD TIE 12 (SUTURE) IMPLANT
SUT VIC AB 3-0 SH 18 (SUTURE) ×6 IMPLANT
SUT VIC AB 3-0 SH 27 (SUTURE) ×4
SUT VIC AB 3-0 SH 27X BRD (SUTURE) ×1 IMPLANT
SUT VIC AB 3-0 SH 27XBRD (SUTURE) ×1 IMPLANT
SYR BULB IRRIGATION 50ML (SYRINGE) IMPLANT
SYRINGE 10CC LL (SYRINGE) ×3 IMPLANT
TOWEL OR 17X24 6PK STRL BLUE (TOWEL DISPOSABLE) ×3 IMPLANT
TOWEL OR 17X26 10 PK STRL BLUE (TOWEL DISPOSABLE) ×6 IMPLANT
TRAY FOLEY CATH 14FRSI W/METER (CATHETERS) IMPLANT
TRAY FOLEY CATH 16FRSI W/METER (SET/KITS/TRAYS/PACK) IMPLANT
TRAY PROCTOSCOPIC FIBER OPTIC (SET/KITS/TRAYS/PACK) IMPLANT
TUBE CONNECTING 12'X1/4 (SUCTIONS)
TUBE CONNECTING 12X1/4 (SUCTIONS) IMPLANT
UNDERPAD 30X30 INCONTINENT (UNDERPADS AND DIAPERS) ×3 IMPLANT
WATER STERILE IRR 1000ML POUR (IV SOLUTION) IMPLANT
YANKAUER SUCT BULB TIP NO VENT (SUCTIONS) IMPLANT

## 2014-06-26 NOTE — Anesthesia Postprocedure Evaluation (Signed)
  Anesthesia Post-op Note  Patient: Katelyn Rojas  Procedure(s) Performed: Procedure(s): EXPLORATORY LAPAROTOMY (N/A) TRANSVERSE COLECTOMY/COLOSTOMY (N/A) NEW GASTROSTOMY TUBE (N/A)  Patient Location: SICU  Anesthesia Type:General  Level of Consciousness: sedated, unresponsive and Patient remains intubated per anesthesia plan  Airway and Oxygen Therapy: Patient remains intubated per anesthesia plan and Patient placed on Ventilator (see vital sign flow sheet for setting)  Post-op Pain: none  Post-op Assessment: Post-op Vital signs reviewed and [name] Cardiovascular Status Stable  Post-op Vital Signs: stable  Last Vitals:  Filed Vitals:   06/26/14 1114  BP: 84/49  Pulse: 74  Temp:   Resp: 14    Complications: No apparent anesthesia complications

## 2014-06-26 NOTE — Progress Notes (Signed)
PARENTERAL NUTRITION CONSULT NOTE   Pharmacy Consult for TPN Indication: Perforated colon  Allergies  Allergen Reactions  . Namenda [Memantine Hcl] Other (See Comments)    "hallucinations"   . Codeine Other (See Comments)    Unknown. Listed on [facility]  . Penicillins Other (See Comments)    Unknown; listed on [facility]    Patient Measurements: Height: 5' 1.02" (155 cm) Weight: 142 lb 6.7 oz (64.6 kg) IBW/kg (Calculated) : 47.86   Vital Signs: Temp: 98.7 F (37.1 C) (08/14 0432) Temp src: Oral (08/14 0432) BP: 129/63 mmHg (08/14 0432) Pulse Rate: 82 (08/14 0432) Intake/Output from previous day: 08/13 0701 - 08/14 0700 In: 2850.3 [I.V.:220; IV Piggyback:750; TPN:1880.3] Out: 1725 [Urine:1725] Intake/Output from this shift:    Labs:  Recent Labs  06/25/14 2200 06/26/14 0500  WBC 17.6*  --   HGB 10.2*  --   HCT 32.9*  --   PLT 398  --   INR  --  1.21     Recent Labs  06/24/14 0450 06/25/14 0600 06/25/14 2200 06/26/14 0500  NA 143 142 141 146  K 3.7 2.9* 3.3* 3.4*  CL 102 98 98 101  CO2 33* 32 34* 36*  GLUCOSE 140* 144* 113* 126*  BUN 17 21 20 22   CREATININE 0.42* 0.40* 0.41* 0.41*  CALCIUM 8.0* 8.3* 8.3* 8.3*  MG 2.1 1.7  --   --   PHOS 2.8 2.8  --   --   PROT  --  6.1  --   --   ALBUMIN  --  2.7*  --   --   AST  --  16  --   --   ALT  --  11  --   --   ALKPHOS  --  105  --   --   BILITOT  --  <0.2*  --   --    Estimated Creatinine Clearance: 55.6 ml/min (by C-G formula based on Cr of 0.41).    Recent Labs  06/25/14 2022 06/26/14 0010 06/26/14 0431  GLUCAP 153* 167* 141*    Insulin Requirements in the past 24 hours:  8 units SSI + 13 units regular insulin in TPN bag  Current Nutrition:  Clinimix E 5/15 at 46mL/hr + lipid 20% emulsion at 80mL/hr- provides 96g protein and 1603 kcal daily- meeting 100% of needs  IVF: 1/2NS +K40 at 69ml/hr (9.6 meq/day K+)  Nutritional Goals:   1400-1600 kCal, 90-10 grams of  protein per day (goal change per RD note 8/12)  Assessment: 73 y/o F from [name] with chronic trach presents with draining PEG tube found to be trans-colonic.  GI: Pt on TF at [name] with prealbumin 19.2 on 8/5. Current prealbumin 20.9. Started TPN in anticipation of prolonged inability to receive TF post-op.  IV PPI. To go to OR today for elap, colectomy with colostomy and new gastrostomy tube  Endo: DM. CBGs/24h 113-169. She is  on chronic steroids- currently on stress dose IV HC. Sugars well controlled with SSI + insulin in TPN bag  Lytes: K 3.4, CorCa 9.3, Na/Cl nml. Last Mg 1.7, Phos 2.8. IVF currently providing 9.24meq K daily.   Renal: Scr 0.4. UOP 1.1- stable.   Pulm: COPD, chronic trach on vent support.  Cards: h/o HTN. BP normal, HR normal-tachy- currently on no CV meds.  Hepatobil: LFT's WNL except low TBili and albumin. Trigs 83 on 8/11  Neuro: anxiety, dementia, lumbar compression fx.   ID: Afebrile. WBC increased  to 17.6 on Vanco/Flagyl/Cipro (all since 8/4) for intraabdominal process   Best Practices: IV PPI, SCD (Surgery defers VTE prophx decision to CCM on phone call 8/11)  TPN Access: 8/6 PICC line TPN day#: 4  Plan:  1. Replace K with 10mEq KCl runs x3 2. Continue Clinimix E 5/15 at 3580ml/hr + lipid 20% emulsion at 585ml/hr- this is at goal rate 3. Continue daily multivitamin and trace elements in TPN 4. Continue 13 units regular insulin in TPN bag 5. Continue CBGs and SSI as ordered 6. Continue IVF as ordered 7. BMET + mag and phos in the morning 8. Follow surgery's plans and ability to start TF  Cormac Wint D. Octave Montrose, PharmD, BCPS Clinical Pharmacist Pager: [phone] 06/26/2014 8:08 AM

## 2014-06-26 NOTE — Progress Notes (Signed)
Chg bath completed

## 2014-06-26 NOTE — Progress Notes (Signed)
To surgery w/OR staff.  On Call abx sent w/patient.

## 2014-06-26 NOTE — Transfer of Care (Signed)
Immediate Anesthesia Transfer of Care Note  Patient: Katelyn Rojas  Procedure(s) Performed: Procedure(s): EXPLORATORY LAPAROTOMY (N/A) TRANSVERSE COLECTOMY/COLOSTOMY (N/A) NEW GASTROSTOMY TUBE (N/A)  Patient Location: ICU (2600)  Anesthesia Type:General  Level of Consciousness: sedated and Patient remains intubated per anesthesia plan  Airway & Oxygen Therapy: Patient remains intubated per anesthesia plan and Patient placed on Ventilator (see vital sign flow sheet for setting)  Post-op Assessment: Report given to PACU RN Eber Jones RN)  Post vital signs: Reviewed and stable  Complications: No apparent anesthesia complications

## 2014-06-26 NOTE — Op Note (Signed)
Preoperative diagnosis: Transcolonic percutaneous gastrostomy tube Postoperative diagnosis: Same as above Procedure: #1 segmental transverse colectomy #2 wedge gastrectomy #3 open Stamm gastrostomy #4 End colostomy Surgeon: Dr. Harden Mo Anesthesia: Gen. Complications: None Drains: None Estimated blood loss: Minimal Sponge and needle count was correct at completion Disposition to recovery stable  Indications: This is a 73 year old female who is chronically ill. She is maintained on the ventilator. She has had a recent history of a PEA arrest. She also had a percutaneous interventional radiology gastrostomy tube placed. She was noted at long-term care facility to have stool draining from this. This was then proven to be a transcolonic gastrostomy tube. I then several conversations with her family about surgery or palliation. They have chosen to undergo surgery. We discussed this at length as well as the risks are quoted a mortality of between 30-50%.  Procedure: After informed consent was obtained from the patient's son she was taken to the operating room. She was on antibiotics on the floor. She had sequential compression devices in place. She was in placed under general anesthesia without complication. Her abdomen was prepped and draped in the standard sterile surgical fashion.  I made a midline incision. I then was able to identify the gastrostomy tube. I brought this into the abdomen. The gastrostomy tube did indeed traverse the colon and going to the stomach. I then noted the proximal colon to be dilated. I divided this with a GIA stapler. I divided the transverse colon distal to this with the GIA stapler also. I then used the LigaSure device to divide the mesentery. I was able to identify where the stomach was adherent to the colon. I removed the gastrostomy tube. 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 I then oversewed this with a 2-0  silk suture. The mesentery was hemostatic. I then ran the entire small bowel as well as the remaining colon there was no evidence of any abnormalities. She did have ascites. I then located a site on the right abdominal wall and I brought the colon up through this. This was not kinked. I secured this on the inside with 3-0 Vicryl suture. I then located a place on the stomach that I could place a new G-tube. I used a MIC tube. I used the old gastrostomy hole. There was really not enough room as she was so small to make another hole anywhere. I then placed 2 2-0 silk pursestring sutures. I entered into the stomach. I placed the tube. I blew up the balloon. I then tied down my pursestrings. I then secured this to the abdominal wall with 2-0 silk suture in a standard fashion. I then snugged this up against the abdominal wall secured this with 3-0 nylon suture. I then closed the abdomen with #1 looped PDS. I left her wound open. I then matured the colostomy with 3-0 Vicryl suture. An appliance and a dressing was placed. She tolerated this well was transferred to the recovery room.

## 2014-06-26 NOTE — Anesthesia Preprocedure Evaluation (Signed)
Anesthesia Evaluation  Patient identified by MRN, date of birth, ID band Patient unresponsive    Reviewed: Allergy & Precautions, H&P , NPO status , [name] Chart, lab work & pertinent test results  Airway      Comment: Trach dependent on ventilator. Dental   Pulmonary pneumonia -, COPDformer smoker,          Cardiovascular hypertension,     Neuro/Psych    GI/Hepatic History noted. Ce   Endo/Other  diabetes  Renal/GU negative Renal ROS     Musculoskeletal   Abdominal   Peds  Hematology   Anesthesia Other Findings   Reproductive/Obstetrics                           Anesthesia Physical Anesthesia Plan  ASA: IV  Anesthesia Plan: General   Post-op Pain Management:    Induction: Intravenous  Airway Management Planned: Tracheostomy  Additional Equipment:   Intra-op Plan:   Post-operative Plan: Post-operative intubation/ventilation  Informed Consent:   Plan Discussed with: CRNA and Anesthesiologist  Anesthesia Plan Comments:         Anesthesia Quick Evaluation

## 2014-06-26 NOTE — Progress Notes (Signed)
  Subjective: Unchanged, events noted  Objective: Vital signs in last 24 hours: Temp:  [95.7 F (35.4 C)-98.7 F (37.1 C)] 98.7 F (37.1 C) (08/14 0432) Pulse Rate:  [60-101] 82 (08/14 0432) Resp:  [13-29] 21 (08/14 0432) BP: (120-137)/(55-65) 129/63 mmHg (08/14 0432) SpO2:  [93 %-100 %] 96 % (08/14 0432) FiO2 (%):  [30 %] 30 % (08/14 0315) Weight:  [142 lb 6.7 oz (64.6 kg)] 142 lb 6.7 oz (64.6 kg) (08/14 0432) Last BM Date: 06/18/14  Intake/Output from previous day: 08/13 0701 - 08/14 0700 In: 2850.3 [I.V.:220; IV Piggyback:750; TPN:1880.3] Out: 1725 [Urine:1725] Intake/Output this shift:    unchagned  Lab Results:   Recent Labs  06/25/14 2200  WBC 17.6*  HGB 10.2*  HCT 32.9*  PLT 398   BMET  Recent Labs  06/25/14 2200 06/26/14 0500  NA 141 146  K 3.3* 3.4*  CL 98 101  CO2 34* 36*  GLUCOSE 113* 126*  BUN 20 22  CREATININE 0.41* 0.41*  CALCIUM 8.3* 8.3*   PT/INR  Recent Labs  06/26/14 0500  LABPROT 15.3*  INR 1.21   ABG No results found for this basename: PHART, PCO2, PO2, HCO3,  in the last 72 hours  Studies/Results: No results found.  Anti-infectives: Anti-infectives   Start     Dose/Rate Route Frequency Ordered Stop   06/25/14 1300  metroNIDAZOLE (FLAGYL) IVPB 500 mg    Comments:  On call to or tomorrow   500 mg 100 mL/hr over 60 Minutes Intravenous  Once 06/25/14 1248     06/18/14 0600  vancomycin (VANCOCIN) IVPB 1000 mg/200 mL premix     1,000 mg 200 mL/hr over 60 Minutes Intravenous Every 24 hours 06/17/14 2234     06/17/14 2100  ciprofloxacin (CIPRO) IVPB 400 mg     400 mg 200 mL/hr over 60 Minutes Intravenous Every 12 hours 06/17/14 1816     06/17/14 1700  metroNIDAZOLE (FLAGYL) IVPB 500 mg     500 mg 100 mL/hr over 60 Minutes Intravenous Every 8 hours 06/17/14 1658        Assessment/Plan: g tube through colon  To or today, high risk, discussed extensively,   [facility] 06/26/2014

## 2014-06-26 NOTE — Progress Notes (Signed)
PULMONARY / CRITICAL CARE MEDICINE   Name: Katelyn Rojas MRN: [id number] DOB: [birth year]    ADMISSION DATE:  06/17/2014 CONSULTATION DATE:  06/17/14  REFERRING MD :  [name] >> ED  CHIEF COMPLAINT: ? Perforated viscus   INITIAL PRESENTATION: 73 y.o. F, [name] resident on chronic trach, brought to ED for concerns that PEG tube had perforated her colon. Per reports, PEG has been draining chronically, some of drainage thought to be stool. Surgeon at [name] was concerned that PEG may be transversing colon into stomach. Pt was brought to ED for evaluation of "perforated viscus"; however, upon arrival to [facility] ED, this was determined to not be the case. PCCM was called for admission.   STUDIES:  8/5 KUB >>> no free air, generalized bowel distention, stable gastrostomy.  8/6 >> No surgery needed, IR to evaluate the G-tube.  8/6 >> BE shows Gastrostomy tube traverses distal transverse segment. Some contrast noted to exit at skin entry site along G tube. No obstruction   SIGNIFICANT EVENTS:  8/5 admit >>> transferred from [name], concerns of perforated viscus, imaging at [facility] ED suggested this to not be the case.  8/7>> Surgery discussing with family about revision of PEG tube, Dr. Dwain Sarna to meet with family on 8/10 to discuss surgical options. Plan to go to or 8/10 possibly.  8/10 TPN started. Surgery planning on to OR soon. 8/12 family has not decided about surgery yet. 8/13 apparently family has decided to proceed with surgery 8/14- Ex lap, new G tube placed, colectomy, colostomy  SUBJECTIVE: post op BP wnl   VITAL SIGNS: Temp:  [97.4 F (36.3 C)-98.7 F (37.1 C)] 97.4 F (36.3 C) (08/14 1141) Pulse Rate:  [73-101] 74 (08/14 1114) Resp:  [14-29] 14 (08/14 1114) BP: (84-137)/(49-65) 84/49 mmHg (08/14 1114) SpO2:  [93 %-100 %] 99 % (08/14 1114) FiO2 (%):  [30 %] 30 % (08/14 1114) Weight:  [64.6 kg (142 lb 6.7 oz)] 64.6 kg (142 lb 6.7 oz) (08/14 0432) HEMODYNAMICS:   VENTILATOR  SETTINGS: Vent Mode:  [-] PRVC FiO2 (%):  [30 %] 30 % Set Rate:  [14 bmp] 14 bmp Vt Set:  [400 mL] 400 mL PEEP:  [5 cmH20] 5 cmH20 Plateau Pressure:  [14 cmH20-20 cmH20] 15 cmH20 INTAKE / OUTPUT:  Intake/Output Summary (Last 24 hours) at 06/26/14 1453 Last data filed at 06/26/14 1100  Gross per 24 hour  Intake 2570.34 ml  Output   2275 ml  Net 295.34 ml    PHYSICAL EXAMINATION: General: chronically ill appearing on vent HEENT: NCAT, trach in place PULM: reduced, clear CV: RRR, no mgr AB: BS infrequent, soft, PEG new, colostomy Ext: contractures, edema Neuro: awake, mouths words   LABS:  CBC  Recent Labs Lab 06/21/14 0119 06/23/14 0525 06/25/14 2200  WBC 9.4 9.6 17.6*  HGB 7.5* 7.9* 10.2*  HCT 24.3* 25.4* 32.9*  PLT 300 313 398   Coag's  Recent Labs Lab 06/21/14 0119 06/22/14 0812 06/23/14 0525 06/26/14 0500  APTT >200*  --  26  --   INR 2.50* 1.32 1.28 1.21   BMET  Recent Labs Lab 06/25/14 0600 06/25/14 2200 06/26/14 0500  NA 142 141 146  K 2.9* 3.3* 3.4*  CL 98 98 101  CO2 32 34* 36*  BUN 21 20 22   CREATININE 0.40* 0.41* 0.41*  GLUCOSE 144* 113* 126*   Electrolytes  Recent Labs Lab 06/23/14 0525 06/24/14 0450 06/25/14 0600 06/25/14 2200 06/26/14 0500  CALCIUM 8.4 8.0* 8.3* 8.3* 8.3*  MG  1.6 2.1 1.7  --   --   PHOS 2.9 2.8 2.8  --   --    Sepsis Markers No results found for this basename: LATICACIDVEN, PROCALCITON, O2SATVEN,  in the last 168 hours ABG No results found for this basename: PHART, PCO2ART, PO2ART,  in the last 168 hours Liver Enzymes  Recent Labs Lab 06/22/14 0812 06/23/14 0525 06/25/14 0600  AST 15 12 16   ALT 16 15 11   ALKPHOS 104 99 105  BILITOT 0.2* <0.2* <0.2*  ALBUMIN 2.6* 2.5* 2.7*   Cardiac Enzymes No results found for this basename: TROPONINI, PROBNP,  in the last 168 hours Glucose  Recent Labs Lab 06/25/14 1619 06/25/14 2022 06/26/14 0010 06/26/14 0431 06/26/14 0751 06/26/14 1140  GLUCAP  158* 153* 167* 141* 176* 273*    Imaging No results found.   ASSESSMENT / PLAN:  PULMONARY  Trach 04/01/14  A:  Chronic respiratory failure  COPD  Tracheostomy Status  P:  Full vent support.  No weaning Albuterol PRN pcxr in am for atx Low threshold ABG Keep same MV  CARDIOVASCULAR  A:  Chronic Hypotension - per son, pt's SBP usually in 80's.  P:  Stress steroids. 50 mg q6hrs remain post op Hold outpatient lopressor.  Follow hemodynamics post op closelt / tele  RENAL  A: hypokalemia P:  3 runs K cl bmet in am  Allow pos blance  GASTROINTESTINAL  A:  PEG malfunctioning , misplaced  Bowel perforation  Severe protein calorie malnutrition   P:  Pantoprazole.  Placed on TPN as malnourished on admission, will follow closely with surgery when we can dc this and start use of feeding tube Even if she is on less than 50% of TF in future would NOT supplement with tpn to meet goals At risk refeeding syndrome when started nPO   HEMATOLOGIC  A:  Chronic Anemia  Coagulopathy on [place] Hep hemoconcentrated? P:  VTE prophylaxis: d/c Heparin 8/9 / PAS hose Transfuse for Hgb < 7.  Cbc in am   INFECTIOUS  A:  Hx MRSA and enterococcus bacteremia Hx Tracheitis (cultures + for Alcaligenes bacteria)  Sacra decubitus ulcer  perf colon from PEG P:  Abx: Vanc, Cipro, Flagyl (from [facility]).  Monitor fever curve / WBC's.  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  P:  SSI.  Stress steroids. ( 8/11 decrease to q 8 h)  NEUROLOGIC  A:  Dementia  Anxiety  Post op pain P:   Fentanyl, Ativan PRN.  Hold outpatient Celexa, Clonopin, Aricept.  Post op morphine   TODAY'S SUMMARY: tolerated OR, k supp, no weaning  Mcarthur RossettiDaniel J. Tyson AliasFeinstein, MD, FACP Pgr: [phone] [facility]

## 2014-06-26 NOTE — Progress Notes (Signed)
No change in current d/c plan- will plan return to [name] when medically stable and bed available. As of today- no beds available at [facility] and they do no anticipate any beds this weekend. CSW will follow up on bed availability on Monday.   Lorri Frederick. Jaci Lazier, [place] [phone]

## 2014-06-27 DIAGNOSIS — R5383 Other fatigue: Secondary | ICD-10-CM

## 2014-06-27 DIAGNOSIS — J449 Chronic obstructive pulmonary disease, unspecified: Secondary | ICD-10-CM

## 2014-06-27 DIAGNOSIS — R5381 Other malaise: Secondary | ICD-10-CM

## 2014-06-27 DIAGNOSIS — E119 Type 2 diabetes mellitus without complications: Secondary | ICD-10-CM

## 2014-06-27 DIAGNOSIS — I428 Other cardiomyopathies: Secondary | ICD-10-CM

## 2014-06-27 LAB — GLUCOSE, CAPILLARY
GLUCOSE-CAPILLARY: 117 mg/dL — AB (ref 70–99)
GLUCOSE-CAPILLARY: 124 mg/dL — AB (ref 70–99)
GLUCOSE-CAPILLARY: 128 mg/dL — AB (ref 70–99)
GLUCOSE-CAPILLARY: 125 mg/dL — AB (ref 70–99)
Glucose-Capillary: 130 mg/dL — ABNORMAL HIGH (ref 70–99)
Glucose-Capillary: 158 mg/dL — ABNORMAL HIGH (ref 70–99)

## 2014-06-27 LAB — MAGNESIUM: Magnesium: 1.8 mg/dL (ref 1.5–2.5)

## 2014-06-27 LAB — BASIC METABOLIC PANEL
Anion gap: 9 (ref 5–15)
BUN: 27 mg/dL — ABNORMAL HIGH (ref 6–23)
CO2: 35 mEq/L — ABNORMAL HIGH (ref 19–32)
CREATININE: 0.41 mg/dL — AB (ref 0.50–1.10)
Calcium: 8.8 mg/dL (ref 8.4–10.5)
Chloride: 99 mEq/L (ref 96–112)
Glucose, Bld: 110 mg/dL — ABNORMAL HIGH (ref 70–99)
Potassium: 3.3 mEq/L — ABNORMAL LOW (ref 3.7–5.3)
Sodium: 143 mEq/L (ref 137–147)

## 2014-06-27 LAB — PHOSPHORUS: Phosphorus: 3.2 mg/dL (ref 2.3–4.6)

## 2014-06-27 MED ORDER — MAGNESIUM SULFATE IN D5W 10-5 MG/ML-% IV SOLN
1.0000 g | Freq: Once | INTRAVENOUS | Status: AC
  Administered 2014-06-27: 1 g via INTRAVENOUS
  Filled 2014-06-27: qty 100

## 2014-06-27 MED ORDER — POTASSIUM CHLORIDE 10 MEQ/50ML IV SOLN
10.0000 meq | INTRAVENOUS | Status: AC
  Administered 2014-06-27 (×3): 10 meq via INTRAVENOUS
  Filled 2014-06-27 (×2): qty 50

## 2014-06-27 MED ORDER — FAT EMULSION 20 % IV EMUL
120.0000 mL | INTRAVENOUS | Status: AC
  Administered 2014-06-27: 120 mL via INTRAVENOUS
  Filled 2014-06-27: qty 200

## 2014-06-27 MED ORDER — TRACE MINERALS CR-CU-F-FE-I-MN-MO-SE-ZN IV SOLN
INTRAVENOUS | Status: AC
  Administered 2014-06-27: 18:00:00 via INTRAVENOUS
  Filled 2014-06-27: qty 2000

## 2014-06-27 MED ORDER — POTASSIUM CHLORIDE 10 MEQ/50ML IV SOLN
10.0000 meq | INTRAVENOUS | Status: AC
  Administered 2014-06-27 (×3): 10 meq via INTRAVENOUS
  Filled 2014-06-27 (×3): qty 50

## 2014-06-27 NOTE — Progress Notes (Signed)
PULMONARY / CRITICAL CARE MEDICINE   Name: Modesty Belgrave MRN: [id number] DOB: 01/11/1941    ADMISSION DATE:  06/17/2014 CONSULTATION DATE:  06/17/14  REFERRING MD :  [name] >> ED  CHIEF COMPLAINT: ? Perforated viscus   INITIAL PRESENTATION: 73 y.o. F, [name] resident on chronic trach, brought to ED for concerns that PEG tube had perforated her colon. Per reports, PEG has been draining chronically, some of drainage thought to be stool. Surgeon at [name] was concerned that PEG may be transversing colon into stomach. Pt was brought to ED for evaluation of "perforated viscus"; however, upon arrival to [facility] ED, this was determined to not be the case. PCCM was called for admission.   STUDIES:  8/5 KUB >>> no free air, generalized bowel distention, stable gastrostomy.  8/6 >> No surgery needed, IR to evaluate the G-tube.  8/6 >> BE shows Gastrostomy tube traverses distal transverse segment. Some contrast noted to exit at skin entry site along G tube. No obstruction   SIGNIFICANT EVENTS:  8/5 admit >>> transferred from [name], concerns of perforated viscus, imaging at [facility] ED suggested this to not be the case.  8/7>> Surgery discussing with family about revision of PEG tube, Dr. Dwain Sarna to meet with family on 8/10 to discuss surgical options. Plan to go to or 8/10 possibly.  8/10 TPN started. Surgery planning on to OR soon. 8/12 family has not decided about surgery yet. 8/13 apparently family has decided to proceed with surgery 8/14- Ex lap, new G tube placed, colectomy, colostomy  SUBJECTIVE: Surgery f/u addressed PEG site care   VITAL SIGNS: Temp:  [97.4 F (36.3 C)-99.1 F (37.3 C)] 99.1 F (37.3 C) (08/15 0441) Pulse Rate:  [74-116] 90 (08/15 0441) Resp:  [12-26] 18 (08/15 0441) BP: (84-143)/(49-71) 117/59 mmHg (08/15 0441) SpO2:  [93 %-99 %] 97 % (08/15 0441) FiO2 (%):  [30 %] 30 % (08/15 0350) Weight:  [68.5 kg (151 lb 0.2 oz)] 68.5 kg (151 lb 0.2 oz) (08/15  0441) HEMODYNAMICS:   VENTILATOR SETTINGS: Vent Mode:  [-] PRVC FiO2 (%):  [30 %] 30 % Set Rate:  [14 bmp] 14 bmp Vt Set:  [400 mL] 400 mL PEEP:  [5 cmH20] 5 cmH20 Plateau Pressure:  [15 cmH20-19 cmH20] 17 cmH20 INTAKE / OUTPUT:  Intake/Output Summary (Last 24 hours) at 06/27/14 0823 Last data filed at 06/27/14 0600  Gross per 24 hour  Intake   3070 ml  Output   1975 ml  Net   1095 ml    PHYSICAL EXAMINATION: General: chronically ill appearing on vent, frail HEENT: NCAT, trach in place PULM: reduced, clear CV: RRR, no mgr AB: BS infrequent, soft, PEG new, colostomy Ext: contractures, edema Neuro: awake, mouths words, appears engaged/ aware   LABS:  CBC  Recent Labs Lab 06/21/14 0119 06/23/14 0525 06/25/14 2200  WBC 9.4 9.6 17.6*  HGB 7.5* 7.9* 10.2*  HCT 24.3* 25.4* 32.9*  PLT 300 313 398   Coag's  Recent Labs Lab 06/21/14 0119 06/22/14 0812 06/23/14 0525 06/26/14 0500  APTT >200*  --  26  --   INR 2.50* 1.32 1.28 1.21   BMET  Recent Labs Lab 06/25/14 2200 06/26/14 0500 06/27/14 0325  NA 141 146 143  K 3.3* 3.4* 3.3*  CL 98 101 99  CO2 34* 36* 35*  BUN 20 22 27*  CREATININE 0.41* 0.41* 0.41*  GLUCOSE 113* 126* 110*   Electrolytes  Recent Labs Lab 06/24/14 0450 06/25/14 0600 06/25/14 2200 06/26/14 0500 06/27/14  0325  CALCIUM 8.0* 8.3* 8.3* 8.3* 8.8  MG 2.1 1.7  --   --  1.8  PHOS 2.8 2.8  --   --  3.2   Sepsis Markers No results found for this basename: LATICACIDVEN, PROCALCITON, O2SATVEN,  in the last 168 hours ABG No results found for this basename: PHART, PCO2ART, PO2ART,  in the last 168 hours Liver Enzymes  Recent Labs Lab 06/22/14 0812 06/23/14 0525 06/25/14 0600  AST 15 12 16   ALT 16 15 11   ALKPHOS 104 99 105  BILITOT 0.2* <0.2* <0.2*  ALBUMIN 2.6* 2.5* 2.7*   Cardiac Enzymes No results found for this basename: TROPONINI, PROBNP,  in the last 168 hours Glucose  Recent Labs Lab 06/26/14 0751 06/26/14 1140  06/26/14 1700 06/26/14 2006 06/27/14 0012 06/27/14 0439  GLUCAP 176* 273* 154* 128* 158* 117*    Imaging No results found.   ASSESSMENT / PLAN:  PULMONARY  Trach 04/01/14  A:  Chronic respiratory failure  COPD  Tracheostomy Status  P:  Full vent support.  No weaning Albuterol PRN Low threshold ABG Keep same MV  CARDIOVASCULAR  A:  Chronic Hypotension - per son, pt's SBP usually in 80's.  P:  Stress steroids. 50 mg q6hrs remain post op- continues Hold outpatient lopressor.  Follow hemodynamics post op closelt / tele  RENAL  A: hypokalemia P:  3 runs K cl- to repeat 8/15 bmet in am  Allow pos blance  GASTROINTESTINAL  A:  PEG malfunctioning , replaced by surgery Severe protein calorie malnutrition   P:  Pantoprazole.  Placed on TPN as malnourished on admission, will follow closely with surgery when we can dc this and start use of feeding tube Even if she is on less than 50% of TF in future would NOT supplement with tpn to meet goals At risk refeeding syndrome when started nPO   HEMATOLOGIC  A:  Chronic Anemia  Coagulopathy on [place] Hep hemoconcentrated? P:  VTE prophylaxis: d/c Heparin 8/9 / PAS hose. To restart lovenox 8/16 per [facility] Transfuse for Hgb < 7.  Cbc in am   INFECTIOUS  A:  Hx MRSA and enterococcus bacteremia Hx Tracheitis (cultures + for Alcaligenes bacteria)  Sacra decubitus ulcer  Leukocytosis post-op, on steroids stress coverage P:  Abx: Vanc, Cipro, Flagyl (from [facility]).  Monitor fever curve / WBC's.  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  P:  SSI.  Stress steroids. ( 8/11 decrease to q 8 h)  NEUROLOGIC  A:  Dementia  Anxiety  Post op pain P:   Fentanyl, Ativan PRN.  Hold outpatient Celexa, Clonopin, Aricept.  Post op morphine   TODAY'S SUMMARY: Leukocytosis after PEG replacement, on steroids  and broad abx. To continue TPN until cleared by [facility], and anticipate they will restart lovenox. For K replacement/ BMET, CBC.  CD Maple HudsonYoung, MD, FACP Pgr: [phone]    M [phone] [facility]

## 2014-06-27 NOTE — Progress Notes (Addendum)
PARENTERAL NUTRITION CONSULT NOTE   Pharmacy Consult for TPN Indication: Perforated colon  Allergies  Allergen Reactions  . Namenda [Memantine Hcl] Other (See Comments)    "hallucinations"   . Codeine Other (See Comments)    Unknown. Listed on [facility]  . Penicillins Other (See Comments)    Unknown; listed on [facility]    Patient Measurements: Height: 5' 1.02" (155 cm) Weight: 151 lb 0.2 oz (68.5 kg) IBW/kg (Calculated) : 47.86   Vital Signs: Temp: 99.1 F (37.3 C) (08/15 0441) Temp src: Oral (08/15 0441) BP: 117/59 mmHg (08/15 0441) Pulse Rate: 90 (08/15 0441) Intake/Output from previous day: 08/14 0701 - 08/15 0700 In: 3155 [I.V.:750; IV Piggyback:600; TPN:1805] Out: 1975 [Urine:1875; Drains:50; Blood:50] Intake/Output from this shift:    Labs:  Recent Labs  06/25/14 2200 06/26/14 0500  WBC 17.6*  --   HGB 10.2*  --   HCT 32.9*  --   PLT 398  --   INR  --  1.21     Recent Labs  06/25/14 0600 06/25/14 2200 06/26/14 0500 06/27/14 0325  NA 142 141 146 143  K 2.9* 3.3* 3.4* 3.3*  CL 98 98 101 99  CO2 32 34* 36* 35*  GLUCOSE 144* 113* 126* 110*  BUN 21 20 22  27*  CREATININE 0.40* 0.41* 0.41* 0.41*  CALCIUM 8.3* 8.3* 8.3* 8.8  MG 1.7  --   --  1.8  PHOS 2.8  --   --  3.2  PROT 6.1  --   --   --   ALBUMIN 2.7*  --   --   --   AST 16  --   --   --   ALT 11  --   --   --   ALKPHOS 105  --   --   --   BILITOT <0.2*  --   --   --    Estimated Creatinine Clearance: 57.1 ml/min (by C-G formula based on Cr of 0.41).    Recent Labs  06/26/14 2006 06/27/14 0012 06/27/14 0439  GLUCAP 128* 158* 117*    Insulin Requirements in the past 24 hours:  10 units SSI + 13 units regular insulin in TPN bag  Current Nutrition:  Clinimix E 5/15 at 76mL/hr + lipid 20% emulsion at 7mL/hr- provides 96g protein and 1603 kcal daily- meeting 100% of needs  IVF: 1/2NS +K40 at 28ml/hr (9.6 meq/day K+)  Nutritional Goals:   1400-1600 kCal,  90-10 grams of protein per day (goal change per RD note 8/12)  Assessment: 73 y/o F from [name] with chronic trach presents with draining PEG tube found to be trans-colonic.  GI: Pt on TF at [name] with prealbumin 19.2 on 8/5. Current prealbumin 20.9. Started TPN in anticipation of prolonged inability to receive TF post-op.  IV PPI. POD#1 colectomy with colostomy and new gastrostomy tube- gastrotomy site and open wound look good  Endo: DM. CBGs/24h 113-169. She is  on chronic steroids- currently on stress dose IV HC. Sugars well controlled with SSI + insulin in TPN bag  Lytes: K 3.3, Mag 1.8, Phos 3.2, CorCa 9.3, Na/Cl nml. IVF currently providing 9.61meq K daily.   Renal: Scr 0.4. UOP 1.1- stable.   Pulm: COPD, chronic trach on vent support.  Cards: h/o HTN. BPsoft, HR normal-tachy- currently on no CV meds.  Hepatobil: LFT's WNL except low TBili and albumin. Trigs 83 on 8/11  Neuro: anxiety, dementia, lumbar compression fx.   ID: Afebrile. WBC  increased to 17.6 on Vanco/Flagyl/Cipro (all since 8/4) for intraabdominal process   Best Practices: IV PPI, SCDs, planning to resume Lovenox on 8/16 per surgery's note  TPN Access: 8/6 PICC line TPN day#: 5  Plan:  1. Replace K with 10mEq KCl runs x3 in addition to the runs that have already been ordered by MD. Also replace magnesium with mag sulfate 1g IV x1 2. Continue Clinimix E 5/15 at 4180ml/hr + lipid 20% emulsion at 245ml/hr- this is at goal rate 3. Continue daily multivitamin and trace elements in TPN 4. Continue 13 units regular insulin in TPN bag 5. Continue CBGs and SSI as ordered 6. Continue IVF as 1/2NS + 40mEq KCl at 5910mL/hr 7. BMET + mag and phos in the morning 8. Follow surgery's plans and ability to start TF   Katelyn Rojas D. Katelyn Rojas, PharmD, BCPS Clinical Pharmacist Pager: [phone] 06/27/2014 7:52 AM

## 2014-06-27 NOTE — Progress Notes (Signed)
Do not use g-tube until Monday.  Katelyn Rojas. Corliss Skains, MD, [facility] Surgery  General/ Trauma Surgery  06/27/2014 1:37 PM

## 2014-06-27 NOTE — Progress Notes (Signed)
1 Day Post-Op  Subjective: Its been a couple hours since she had pain medicine.  She is comfortable on the vent.  VSS, Colostomy looks good.  Red serous drainage in the bag.  Gastrostomy drainage is clear.Open wound looks fine.  Objective: Vital signs in last 24 hours: Temp:  [97.4 F (36.3 C)-99.1 F (37.3 C)] 99.1 F (37.3 C) (08/15 0441) Pulse Rate:  [74-116] 90 (08/15 0441) Resp:  [12-26] 18 (08/15 0441) BP: (84-143)/(49-71) 117/59 mmHg (08/15 0441) SpO2:  [93 %-99 %] 97 % (08/15 0441) FiO2 (%):  [30 %] 30 % (08/15 0350) Weight:  [68.5 kg (151 lb 0.2 oz)] 68.5 kg (151 lb 0.2 oz) (08/15 0441) Last BM Date: 06/26/14 NPO on TNA 50 ml from drains TM 99.1, VSS K+ 3.3 Intake/Output from previous day: 08/14 0701 - 08/15 0700 In: 3155 [I.V.:750; IV Piggyback:600; TPN:1805] Out: 1975 [Urine:1875; Drains:50; Blood:50] Intake/Output this shift:    General appearance: alert, cooperative and no distress Resp: clear to auscultation bilaterally and on Vent GI: No bowel sounds open wound looks fine gastrotomy site looks fine.  the ostomy is pink with some serous-red drainage in ostomy bag.  No Bowel sounds.  Lab Results:   Recent Labs  06/25/14 2200  WBC 17.6*  HGB 10.2*  HCT 32.9*  PLT 398    BMET  Recent Labs  06/26/14 0500 06/27/14 0325  NA 146 143  K 3.4* 3.3*  CL 101 99  CO2 36* 35*  GLUCOSE 126* 110*  BUN 22 27*  CREATININE 0.41* 0.41*  CALCIUM 8.3* 8.8   PT/INR  Recent Labs  06/26/14 0500  LABPROT 15.3*  INR 1.21     Recent Labs Lab 06/22/14 0812 06/23/14 0525 06/25/14 0600  AST 15 12 16   ALT 16 15 11   ALKPHOS 104 99 105  BILITOT 0.2* <0.2* <0.2*  PROT 5.7* 5.4* 6.1  ALBUMIN 2.6* 2.5* 2.7*     Lipase  No results found for this basename: lipase     Studies/Results: No results found.  Medications: . antiseptic oral rinse  7 mL Mouth Rinse QID  . chlorhexidine  15 mL Mouth Rinse BID  . ciprofloxacin  400 mg Intravenous Q12H  .  hydrocortisone sod succinate (SOLU-CORTEF) inj  50 mg Intravenous Q8H  . insulin aspart  0-9 Units Subcutaneous 6 times per day  . metronidazole  500 mg Intravenous Q8H  . metronidazole  500 mg Intravenous Once  . pantoprazole (PROTONIX) IV  40 mg Intravenous Q24H  . vancomycin  1,000 mg Intravenous Q24H    Assessment/Plan 1.  Transcolonic percutaneous gastrostomy tube placed 04/2014. S/p  segmental transverse colectomy, wedge gastrectomy, open Stamm gastrostomy,  End colostomy  Surgeon: Dr. Harden Mo.  06/26/2014  2.COPD/Tracheostomy/chronic ventilator support at [name] 3.Chronic Hypotension 4.  Anemia  5. MRSA/ENTEROCOCCUS BACTEREMIA 6.  Sacral decubitus 7.  Anxiety/dementia 8.  Dysphagia and malnutrition  9.  Hypokalemia  Plan:  Continue TNA, she needs K+ replacement but will defer that to primary.  Continue Vent support.  Start BId dressing changes to open wound.  SCD for DVT, we can restart lovenox tomorrow.  LOS: 10 days    Katelyn Rojas 06/27/2014

## 2014-06-28 LAB — CBC WITH DIFFERENTIAL/PLATELET
BASOS ABS: 0 10*3/uL (ref 0.0–0.1)
BASOS PCT: 0 % (ref 0–1)
Eosinophils Absolute: 0 10*3/uL (ref 0.0–0.7)
Eosinophils Relative: 0 % (ref 0–5)
HEMATOCRIT: 28.1 % — AB (ref 36.0–46.0)
Hemoglobin: 8.7 g/dL — ABNORMAL LOW (ref 12.0–15.0)
Lymphocytes Relative: 6 % — ABNORMAL LOW (ref 12–46)
Lymphs Abs: 1.1 10*3/uL (ref 0.7–4.0)
MCH: 31.5 pg (ref 26.0–34.0)
MCHC: 31 g/dL (ref 30.0–36.0)
MCV: 101.8 fL — ABNORMAL HIGH (ref 78.0–100.0)
MONO ABS: 1.3 10*3/uL — AB (ref 0.1–1.0)
MONOS PCT: 7 % (ref 3–12)
NEUTROS ABS: 15.9 10*3/uL — AB (ref 1.7–7.7)
Neutrophils Relative %: 87 % — ABNORMAL HIGH (ref 43–77)
Platelets: 287 10*3/uL (ref 150–400)
RBC: 2.76 MIL/uL — ABNORMAL LOW (ref 3.87–5.11)
RDW: 15.3 % (ref 11.5–15.5)
WBC: 18.3 10*3/uL — ABNORMAL HIGH (ref 4.0–10.5)

## 2014-06-28 LAB — GLUCOSE, CAPILLARY
GLUCOSE-CAPILLARY: 104 mg/dL — AB (ref 70–99)
GLUCOSE-CAPILLARY: 122 mg/dL — AB (ref 70–99)
GLUCOSE-CAPILLARY: 136 mg/dL — AB (ref 70–99)
Glucose-Capillary: 112 mg/dL — ABNORMAL HIGH (ref 70–99)
Glucose-Capillary: 120 mg/dL — ABNORMAL HIGH (ref 70–99)
Glucose-Capillary: 134 mg/dL — ABNORMAL HIGH (ref 70–99)

## 2014-06-28 LAB — BASIC METABOLIC PANEL
Anion gap: 10 (ref 5–15)
BUN: 34 mg/dL — AB (ref 6–23)
CALCIUM: 8.8 mg/dL (ref 8.4–10.5)
CO2: 31 mEq/L (ref 19–32)
CREATININE: 0.51 mg/dL (ref 0.50–1.10)
Chloride: 95 mEq/L — ABNORMAL LOW (ref 96–112)
GFR calc Af Amer: >90 mL/min (ref >90)
GFR calc non Af Amer: >90 mL/min (ref >90)
Glucose, Bld: 127 mg/dL — ABNORMAL HIGH (ref 70–99)
Potassium: 4.8 mEq/L (ref 3.7–5.3)
Sodium: 136 mEq/L — ABNORMAL LOW (ref 137–147)

## 2014-06-28 LAB — MAGNESIUM: Magnesium: 2.1 mg/dL (ref 1.5–2.5)

## 2014-06-28 LAB — PHOSPHORUS: Phosphorus: 3.2 mg/dL (ref 2.3–4.6)

## 2014-06-28 MED ORDER — FAT EMULSION 20 % IV EMUL
120.0000 mL | INTRAVENOUS | Status: AC
  Administered 2014-06-28: 120 mL via INTRAVENOUS
  Filled 2014-06-28: qty 200

## 2014-06-28 MED ORDER — SODIUM CHLORIDE 0.45 % IV SOLN
INTRAVENOUS | Status: DC
  Administered 2014-06-28: 09:00:00 via INTRAVENOUS
  Administered 2014-06-29: 500 mL via INTRAVENOUS

## 2014-06-28 MED ORDER — TRACE MINERALS CR-CU-F-FE-I-MN-MO-SE-ZN IV SOLN
INTRAVENOUS | Status: AC
  Administered 2014-06-28: 18:00:00 via INTRAVENOUS
  Filled 2014-06-28: qty 2000

## 2014-06-28 NOTE — Progress Notes (Signed)
ANTIBIOTIC CONSULT NOTE - FOLLOW UP  Pharmacy Consult for Vancomycin, Cipro Indication: Possible intra-abdominal infection/cellulitis  Allergies  Allergen Reactions  . Namenda [Memantine Hcl] Other (See Comments)    "hallucinations"   . Codeine Other (See Comments)    Unknown. [facility]  . Penicillins Other (See Comments)    [facility]    Patient Measurements: Height: 5' 1.02" (155 cm) Weight: 149 lb 11.1 oz (67.9 kg) IBW/kg (Calculated) : 47.86 Vital Signs: Temp: 97.3 F (36.3 C) (08/16 1204) Temp src: Axillary (08/16 1204) BP: 143/71 mmHg (08/16 1300) Pulse Rate: 98 (08/16 1300) Intake/Output from previous day: 08/15 0701 - 08/16 0700 In: 3615 [I.V.:250; IV Piggyback:1300; TPN:2065] Out: 1125 [Urine:900; Drains:225] Intake/Output from this shift: Total I/O In: -  Out: 500 [Urine:500] Labs:  Recent Labs  06/25/14 2200 06/26/14 0500 06/27/14 0325 06/28/14 0402  WBC 17.6*  --   --  18.3*  HGB 10.2*  --   --  8.7*  PLT 398  --   --  287  CREATININE 0.41* 0.41* 0.41* 0.51   Estimated Creatinine Clearance: 56.9 ml/min (by C-G formula based on Cr of 0.51). No results found for this basename: VANCOTROUGH, Leodis BinetVANCOPEAK, VANCORANDOM, GENTTROUGH, GENTPEAK, GENTRANDOM, TOBRATROUGH, TOBRAPEAK, TOBRARND, AMIKACINPEAK, AMIKACINTROU, AMIKACIN,  in the last 72 hours   Microbiology: Recent Results (from the past 720 hour(s))  MRSA PCR SCREENING     Status: Abnormal   Collection Time    06/17/14  6:28 PM      Result Value Ref Range Status   MRSA by PCR POSITIVE (*) NEGATIVE Final   Comment:            The GeneXpert MRSA Assay (FDA     approved for NASAL specimens     only), is one component of a     comprehensive MRSA colonization     surveillance program. It is not     intended to diagnose MRSA     infection nor to guide or     monitor treatment for     MRSA infections.     RESULT CALLED TO, READ BACK BY AND VERIFIED  WITH:     A BECK RN 2031 06/17/14 A BROWNING  SURGICAL PCR SCREEN     Status: Abnormal   Collection Time    06/26/14  8:20 AM      Result Value Ref Range Status   MRSA, PCR POSITIVE (*) NEGATIVE Final   Staphylococcus aureus POSITIVE (*) NEGATIVE Final   Comment:            The Xpert SA Assay (FDA     approved for NASAL specimens     in patients over 71 years of age),     is one component of     a comprehensive surveillance     program.  Test performance has     been validated by [facility] for patients greater     than or equal to 71 year old.     It is not intended     to diagnose infection nor to     guide or monitor treatment.    Anti-infectives   Start     Dose/Rate Route Frequency Ordered Stop   06/25/14 1300  metroNIDAZOLE (FLAGYL) IVPB 500 mg    Comments:  On call to or tomorrow   500 mg 100 mL/hr over 60 Minutes Intravenous  Once 06/25/14 1248  06/18/14 0600  vancomycin (VANCOCIN) IVPB 1000 mg/200 mL premix     1,000 mg 200 mL/hr over 60 Minutes Intravenous Every 24 hours 06/17/14 2234     06/17/14 2100  ciprofloxacin (CIPRO) IVPB 400 mg     400 mg 200 mL/hr over 60 Minutes Intravenous Every 12 hours 06/17/14 1816     06/17/14 1700  metroNIDAZOLE (FLAGYL) IVPB 500 mg     500 mg 100 mL/hr over 60 Minutes Intravenous Every 8 hours 06/17/14 1658       Assessment: 73 YOF on  of vancomycin, Cipro, and Flagyl s/p bowel surgery/colostomy and sacral decubitus ulcer. Trough has been therapeutic on vancomycin 1g IV q24h. SCr has remained stable however difficult to interpret given low muscle mass of patient.  Patient remains afebrile and WBC elevated.   No cultures available.   Goal of Therapy:  Vancomycin trough level 15-20 mcg/ml  Plan:  1. Continue vancomycin 1g IV q24 - will check trough level before tomorrow AM dose. 2. Continue Cipro 400mg  IV q12h. 3. Follow-up surgical plans and length of therapy.   Tad Moore, BCPS  Clinical  Pharmacist Pager [phone]  06/28/2014 1:37 PM

## 2014-06-28 NOTE — Progress Notes (Signed)
PARENTERAL NUTRITION CONSULT NOTE   Pharmacy Consult for TPN Indication: Perforated colon  Allergies  Allergen Reactions  . Namenda [Memantine Hcl] Other (See Comments)    "hallucinations"   . Codeine Other (See Comments)    Unknown. Listed on [facility]  . Penicillins Other (See Comments)    Unknown; listed on [facility]    Patient Measurements: Height: 5' 1.02" (155 cm) Weight: 149 lb 11.1 oz (67.9 kg) IBW/kg (Calculated) : 47.86   Vital Signs: Temp: 97.1 F (36.2 C) (08/16 0456) Temp src: Axillary (08/16 0456) BP: 100/62 mmHg (08/16 0456) Pulse Rate: 105 (08/16 0456) Intake/Output from previous day: 08/15 0701 - 08/16 0700 In: 3130 [I.V.:220; IV Piggyback:1100; TPN:1810] Out: 925 [Urine:900; Drains:25] Intake/Output from this shift:    Labs:  Recent Labs  06/25/14 2200 06/26/14 0500 06/28/14 0402  WBC 17.6*  --  18.3*  HGB 10.2*  --  8.7*  HCT 32.9*  --  28.1*  PLT 398  --  287  INR  --  1.21  --      Recent Labs  06/26/14 0500 06/27/14 0325 06/28/14 0402  NA 146 143 136*  K 3.4* 3.3* 4.8  CL 101 99 95*  CO2 36* 35* 31  GLUCOSE 126* 110* 127*  BUN 22 27* 34*  CREATININE 0.41* 0.41* 0.51  CALCIUM 8.3* 8.8 8.8  MG  --  1.8 2.1  PHOS  --  3.2 3.2   Estimated Creatinine Clearance: 56.9 ml/min (by C-G formula based on Cr of 0.51).    Recent Labs  06/27/14 2032 06/28/14 0051 06/28/14 0455  GLUCAP 125* 120* 134*    Insulin Requirements in the past 24 hours:  5 units SSI + 13 units regular insulin in TPN bag  Current Nutrition:  Clinimix E 5/15 at 57mL/hr + lipid 20% emulsion at 37mL/hr- provides 96g protein and 1603 kcal daily- meeting 100% of needs  IVF: 1/2NS +K40 at 71ml/hr (9.6 meq/day K+)  Nutritional Goals:   1400-1600 kCal, 90-10 grams of protein per day (goal change per RD note 8/12)  Assessment: 73 y/o F from [name] with chronic trach presents with draining PEG tube found to be trans-colonic.  GI: Pt on  TF at [name] with prealbumin 19.2 on 8/5. Current prealbumin 20.9. Started TPN in anticipation of prolonged inability to receive TF post-op.  IV PPI. POD#2 colectomy with colostomy and new gastrostomy tube- gastrotomy site and open wound look good. Per surgery, not to use G-tube until Monday  Endo: DM.  She is  on chronic steroids- currently on stress dose IV HC- 50mg  IV q8h. CBGs/24h 110-134- well controlled with SSI + insulin in TPN bag  Lytes: K 4.8, Mag 2.1, Phos 3.2, CorCa 9.3, Na/Cl dropped slightly this morning. IVF currently providing 9.31meq K daily.   Renal: Scr 0.51. UOP 0.58mL/kg/hr- down.   Pulm: COPD, chronic trach on vent support.  Cards: h/o HTN. BP soft, HR normal-tachy- currently on no CV meds.  Hepatobil: LFT's WNL except low TBili and albumin. Trigs 83 on 8/11  Neuro: anxiety, dementia, lumbar compression fx.   ID: Afebrile. WBC increased to 18.3 on Vanco/Flagyl/Cipro (all since 8/4) for intraabdominal process. Patient is on steroids, but WBC have continued to increase.  Best Practices: IV PPI, SCDs  TPN Access: 8/6 PICC line TPN day#: 6  Plan:  1. Continue Clinimix E 5/15 at 9ml/hr + lipid 20% emulsion at 65ml/hr- this is at goal rate 2. Continue daily multivitamin and trace elements in  TPN 3. Continue 13 units regular insulin in TPN bag 4. Continue CBGs and SSI as ordered 5. Change IVF to 1/2NS at 7710mL/hr (without KCl) so as not to have potassium increase too much 6. TPN labs as ordered 7. Follow surgery's plans and ability to start TF 8. F/u start of Lovenox as per surgery's plans   Lizzie Cokley D. Dezman Granda, PharmD, BCPS Clinical Pharmacist Pager: [phone] 06/28/2014 7:48 AM

## 2014-06-28 NOTE — Progress Notes (Signed)
2 Days Post-Op  Subjective: No acute changes overnight.  Objective: Vital signs in last 24 hours: Temp:  [97.1 F (36.2 C)-99.4 F (37.4 C)] 98.8 F (37.1 C) (08/16 0808) Pulse Rate:  [87-114] 101 (08/16 0808) Resp:  [11-23] 20 (08/16 0808) BP: (92-141)/(48-83) 141/66 mmHg (08/16 0829) SpO2:  [94 %-100 %] 94 % (08/16 0829) FiO2 (%):  [30 %] 30 % (08/16 0830) Weight:  [149 lb 11.1 oz (67.9 kg)] 149 lb 11.1 oz (67.9 kg) (08/16 0456) Last BM Date: 06/26/14  Intake/Output from previous day: 08/15 0701 - 08/16 0700 In: 3615 [I.V.:250; IV Piggyback:1300; TPN:2065] Out: 1125 [Urine:900; Drains:225] Intake/Output this shift: Total I/O In: -  Out: 500 [Urine:500]  General appearance: alert GI: s/ approp ttp/ ostomy pink/ midline wound dressed/   Lab Results:   Recent Labs  06/25/14 2200 06/28/14 0402  WBC 17.6* 18.3*  HGB 10.2* 8.7*  HCT 32.9* 28.1*  PLT 398 287   BMET  Recent Labs  06/27/14 0325 06/28/14 0402  NA 143 136*  K 3.3* 4.8  CL 99 95*  CO2 35* 31  GLUCOSE 110* 127*  BUN 27* 34*  CREATININE 0.41* 0.51  CALCIUM 8.8 8.8   PT/INR  Recent Labs  06/26/14 0500  LABPROT 15.3*  INR 1.21   ABG No results found for this basename: PHART, PCO2, PO2, HCO3,  in the last 72 hours  Studies/Results: No results found.  Anti-infectives: Anti-infectives   Start     Dose/Rate Route Frequency Ordered Stop   06/25/14 1300  metroNIDAZOLE (FLAGYL) IVPB 500 mg    Comments:  On call to or tomorrow   500 mg 100 mL/hr over 60 Minutes Intravenous  Once 06/25/14 1248     06/18/14 0600  vancomycin (VANCOCIN) IVPB 1000 mg/200 mL premix     1,000 mg 200 mL/hr over 60 Minutes Intravenous Every 24 hours 06/17/14 2234     06/17/14 2100  ciprofloxacin (CIPRO) IVPB 400 mg     400 mg 200 mL/hr over 60 Minutes Intravenous Every 12 hours 06/17/14 1816     06/17/14 1700  metroNIDAZOLE (FLAGYL) IVPB 500 mg     500 mg 100 mL/hr over 60 Minutes Intravenous Every 8 hours  06/17/14 1658        Assessment/Plan: 1. Transcolonic percutaneous gastrostomy tube placed 04/2014.  S/p segmental transverse colectomy, wedge gastrectomy, open Stamm gastrostomy, End colostomy Surgeon: Dr. Harden Mo. 06/26/2014  2.COPD/Tracheostomy/chronic ventilator support at [name]  3.Chronic Hypotension  4. Anemia  5. MRSA/ENTEROCOCCUS BACTEREMIA  6. Sacral decubitus  7. Anxiety/dementia  8. Dysphagia and malnutrition  9. Hypokalemia   Plan: Bid dressing changes to open wound.  Don't use Gtube until Monday     LOS: 11 days    Marigene Ehlers., Jed Limerick 06/28/2014

## 2014-06-28 NOTE — Progress Notes (Signed)
PULMONARY / CRITICAL CARE MEDICINE   Name: Katelyn Rojas MRN: [id number] DOB: 02-Jul-1941    ADMISSION DATE:  06/17/2014 CONSULTATION DATE:  06/17/14  REFERRING MD :  [name] >> ED  CHIEF COMPLAINT: ? Perforated viscus   INITIAL PRESENTATION: 73 y.o. F, [name] resident on chronic trach, brought to ED for concerns that PEG tube had perforated her colon. Per reports, PEG has been draining chronically, some of drainage thought to be stool. Surgeon at [name] was concerned that PEG may be transversing colon into stomach. Pt was brought to ED for evaluation of "perforated viscus"; however, upon arrival to [facility] ED, this was determined to not be the case. PCCM was called for admission.   STUDIES:  8/5 KUB >>> no free air, generalized bowel distention, stable gastrostomy.  8/6 >> No surgery needed, IR to evaluate the G-tube.  8/6 >> BE shows Gastrostomy tube traverses distal transverse segment. Some contrast noted to exit at skin entry site along G tube. No obstruction   SIGNIFICANT EVENTS:  8/5 admit >>> transferred from [name], concerns of perforated viscus, imaging at [facility] ED suggested this to not be the case.  8/7>> Surgery discussing with family about revision of PEG tube, Dr. Dwain Sarna to meet with family on 8/10 to discuss surgical options. Plan to go to or 8/10 possibly.  8/10 TPN started. Surgery planning on to OR soon. 8/12 family has not decided about surgery yet. 8/13 apparently family has decided to proceed with surgery 8/14- Ex lap, new G tube placed, colectomy, colostomy  SUBJECTIVE:  Stable on vent overnight, no increased wob.  Very tired but awake.   VITAL SIGNS: Temp:  [97.1 F (36.2 C)-99.4 F (37.4 C)] 98.8 F (37.1 C) (08/16 0808) Pulse Rate:  [87-114] 101 (08/16 0808) Resp:  [11-23] 20 (08/16 0808) BP: (92-141)/(48-83) 141/66 mmHg (08/16 0829) SpO2:  [94 %-100 %] 94 % (08/16 0829) FiO2 (%):  [30 %] 30 % (08/16 0830) Weight:  [67.9 kg (149 lb 11.1 oz)] 67.9 kg (149  lb 11.1 oz) (08/16 0456) HEMODYNAMICS:   VENTILATOR SETTINGS: Vent Mode:  [-] PRVC FiO2 (%):  [30 %] 30 % Set Rate:  [14 bmp-21 bmp] 14 bmp Vt Set:  [400 mL] 400 mL PEEP:  [5 cmH20] 5 cmH20 Plateau Pressure:  [12 cmH20-27 cmH20] 24 cmH20 INTAKE / OUTPUT:  Intake/Output Summary (Last 24 hours) at 06/28/14 1145 Last data filed at 06/28/14 0813  Gross per 24 hour  Intake   3215 ml  Output   1625 ml  Net   1590 ml    PHYSICAL EXAMINATION: General: chronically ill appearing on vent, no increased wob. HEENT: trach in place, clean site, no JVD or LN PULM: decreased bs but fairly clear. CV: minimal tachy but regular, no murmur AB: postop belly, decreased bs Ext: contractures, edema noted Neuro: awake, mouths words, appropriate   LABS:  CBC  Recent Labs Lab 06/23/14 0525 06/25/14 2200 06/28/14 0402  WBC 9.6 17.6* 18.3*  HGB 7.9* 10.2* 8.7*  HCT 25.4* 32.9* 28.1*  PLT 313 398 287   Coag's  Recent Labs Lab 06/22/14 0812 06/23/14 0525 06/26/14 0500  APTT  --  26  --   INR 1.32 1.28 1.21   BMET  Recent Labs Lab 06/26/14 0500 06/27/14 0325 06/28/14 0402  NA 146 143 136*  K 3.4* 3.3* 4.8  CL 101 99 95*  CO2 36* 35* 31  BUN 22 27* 34*  CREATININE 0.41* 0.41* 0.51  GLUCOSE 126* 110* 127*  Electrolytes  Recent Labs Lab 06/25/14 0600  06/26/14 0500 06/27/14 0325 06/28/14 0402  CALCIUM 8.3*  < > 8.3* 8.8 8.8  MG 1.7  --   --  1.8 2.1  PHOS 2.8  --   --  3.2 3.2  < > = values in this interval not displayed. Sepsis Markers No results found for this basename: LATICACIDVEN, PROCALCITON, O2SATVEN,  in the last 168 hours ABG No results found for this basename: PHART, PCO2ART, PO2ART,  in the last 168 hours Liver Enzymes  Recent Labs Lab 06/22/14 0812 06/23/14 0525 06/25/14 0600  AST 15 12 16   ALT 16 15 11   ALKPHOS 104 99 105  BILITOT 0.2* <0.2* <0.2*  ALBUMIN 2.6* 2.5* 2.7*   Cardiac Enzymes No results found for this basename: TROPONINI,  PROBNP,  in the last 168 hours Glucose  Recent Labs Lab 06/27/14 1229 06/27/14 1624 06/27/14 2032 06/28/14 0051 06/28/14 0455 06/28/14 0807  GLUCAP 130* 128* 125* 120* 134* 104*    Imaging No results found.   ASSESSMENT / PLAN:  PULMONARY  Trach 04/01/14  A:  Chronic respiratory failure  COPD  Tracheostomy Status  P:  Full vent support for at least another day    INFECTIOUS  A:  Hx MRSA and enterococcus bacteremia Hx Tracheitis (cultures + for Alcaligenes bacteria)  Sacra decubitus ulcer  Leukocytosis post-op, on steroids stress coverage P:  Abx: Vanc, Cipro, Flagyl (from [facility]).  Monitor fever curve / WBC's.  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  P:  SSI.  Stress steroids. ( 8/11 decrease to q 8 h)

## 2014-06-29 ENCOUNTER — Encounter (HOSPITAL_COMMUNITY): Payer: Self-pay | Admitting: General Surgery

## 2014-06-29 LAB — COMPREHENSIVE METABOLIC PANEL
ALT: 13 U/L (ref 0–35)
AST: 16 U/L (ref 0–37)
Albumin: 2.6 g/dL — ABNORMAL LOW (ref 3.5–5.2)
Alkaline Phosphatase: 82 U/L (ref 39–117)
Anion gap: 6 (ref 5–15)
BUN: 32 mg/dL — ABNORMAL HIGH (ref 6–23)
CALCIUM: 8.9 mg/dL (ref 8.4–10.5)
CO2: 37 mEq/L — ABNORMAL HIGH (ref 19–32)
CREATININE: 0.43 mg/dL — AB (ref 0.50–1.10)
Chloride: 99 mEq/L (ref 96–112)
GFR calc non Af Amer: >90 mL/min (ref >90)
GLUCOSE: 111 mg/dL — AB (ref 70–99)
Potassium: 3.6 mEq/L — ABNORMAL LOW (ref 3.7–5.3)
SODIUM: 142 meq/L (ref 137–147)
Total Bilirubin: 0.3 mg/dL (ref 0.3–1.2)
Total Protein: 6.1 g/dL (ref 6.0–8.3)

## 2014-06-29 LAB — CBC
HCT: 27 % — ABNORMAL LOW (ref 36.0–46.0)
Hemoglobin: 8.4 g/dL — ABNORMAL LOW (ref 12.0–15.0)
MCH: 30.8 pg (ref 26.0–34.0)
MCHC: 31.1 g/dL (ref 30.0–36.0)
MCV: 98.9 fL (ref 78.0–100.0)
PLATELETS: 309 10*3/uL (ref 150–400)
RBC: 2.73 MIL/uL — ABNORMAL LOW (ref 3.87–5.11)
RDW: 15.2 % (ref 11.5–15.5)
WBC: 12.9 10*3/uL — AB (ref 4.0–10.5)

## 2014-06-29 LAB — GLUCOSE, CAPILLARY
GLUCOSE-CAPILLARY: 145 mg/dL — AB (ref 70–99)
Glucose-Capillary: 117 mg/dL — ABNORMAL HIGH (ref 70–99)
Glucose-Capillary: 170 mg/dL — ABNORMAL HIGH (ref 70–99)
Glucose-Capillary: 122 mg/dL — ABNORMAL HIGH (ref 70–99)
Glucose-Capillary: 120 mg/dL — ABNORMAL HIGH (ref 70–99)
Glucose-Capillary: 191 mg/dL — ABNORMAL HIGH (ref 70–99)

## 2014-06-29 LAB — TRIGLYCERIDES: Triglycerides: 25 mg/dL (ref <150)

## 2014-06-29 LAB — MAGNESIUM: MAGNESIUM: 2 mg/dL (ref 1.5–2.5)

## 2014-06-29 LAB — PHOSPHORUS: PHOSPHORUS: 3.5 mg/dL (ref 2.3–4.6)

## 2014-06-29 LAB — VANCOMYCIN, TROUGH: Vancomycin Tr: 17.3 ug/mL (ref 10.0–20.0)

## 2014-06-29 MED ORDER — OSMOLITE 1.2 CAL PO LIQD
1000.0000 mL | ORAL | Status: DC
  Administered 2014-06-29: 1000 mL
  Filled 2014-06-29 (×6): qty 1000

## 2014-06-29 MED ORDER — POTASSIUM CHLORIDE 10 MEQ/50ML IV SOLN
10.0000 meq | INTRAVENOUS | Status: AC
  Administered 2014-06-29 (×4): 10 meq via INTRAVENOUS
  Filled 2014-06-29 (×3): qty 50

## 2014-06-29 MED ORDER — FAT EMULSION 20 % IV EMUL
250.0000 mL | INTRAVENOUS | Status: AC
  Administered 2014-06-29: 250 mL via INTRAVENOUS
  Filled 2014-06-29: qty 250

## 2014-06-29 MED ORDER — VITAL HIGH PROTEIN PO LIQD: 1000.0000 mL | ORAL | Status: DC

## 2014-06-29 MED ORDER — TRACE MINERALS CR-CU-F-FE-I-MN-MO-SE-ZN IV SOLN
INTRAVENOUS | Status: AC
  Administered 2014-06-29: 18:00:00 via INTRAVENOUS
  Filled 2014-06-29: qty 2000

## 2014-06-29 MED ORDER — PRO-STAT SUGAR FREE PO LIQD
30.0000 mL | Freq: Three times a day (TID) | ORAL | Status: DC
  Administered 2014-06-29 – 2014-06-30 (×4): 30 mL
  Filled 2014-06-29 (×5): qty 30

## 2014-06-29 MED ORDER — HYDROCORTISONE NA SUCCINATE PF 100 MG IJ SOLR
25.0000 mg | Freq: Two times a day (BID) | INTRAMUSCULAR | Status: DC
  Administered 2014-06-29: 25 mg via INTRAVENOUS
  Filled 2014-06-29 (×2): qty 0.5

## 2014-06-29 MED ORDER — JEVITY 1.2 CAL PO LIQD
1000.0000 mL | ORAL | Status: DC
  Filled 2014-06-29 (×2): qty 1000

## 2014-06-29 NOTE — Progress Notes (Signed)
3 Days Post-Op  Subjective: Patient awake, seems comfortable Gastrostomy with some bilious output Colostomy with stool output in bag  Objective: Vital signs in last 24 hours: Temp:  [97.3 F (36.3 C)-98.6 F (37 C)] 98 F (36.7 C) (08/17 0753) Pulse Rate:  [85-117] 86 (08/17 0753) Resp:  [11-24] 12 (08/17 0753) BP: (90-156)/(47-71) 156/67 mmHg (08/17 0847) SpO2:  [94 %-99 %] 99 % (08/17 0753) FiO2 (%):  [30 %] 30 % (08/17 0847) Weight:  [149 lb 4 oz (67.7 kg)] 149 lb 4 oz (67.7 kg) (08/17 0437) Last BM Date: 06/28/14  Intake/Output from previous day: 08/16 0701 - 08/17 0700 In: 2763.5 [I.V.:219.8; IV Piggyback:900; TPN:1643.7] Out: 2760 [Urine:2500; Drains:260] Intake/Output this shift:    General appearance: awake, no distress Wound - clean; minimal drainage Ostomy - pink; stool in bag Gastrostomy site OK.  Bilious drainage in bag  Lab Results:   Recent Labs  06/28/14 0402 06/29/14 0513  WBC 18.3* 12.9*  HGB 8.7* 8.4*  HCT 28.1* 27.0*  PLT 287 309   BMET  Recent Labs  06/28/14 0402 06/29/14 0513  NA 136* 142  K 4.8 3.6*  CL 95* 99  CO2 31 37*  GLUCOSE 127* 111*  BUN 34* 32*  CREATININE 0.51 0.43*  CALCIUM 8.8 8.9   PT/INR No results found for this basename: LABPROT, INR,  in the last 72 hours ABG No results found for this basename: PHART, PCO2, PO2, HCO3,  in the last 72 hours  Studies/Results: No results found.  Anti-infectives: Anti-infectives   Start     Dose/Rate Route Frequency Ordered Stop   06/25/14 1300  metroNIDAZOLE (FLAGYL) IVPB 500 mg  Status:  Discontinued    Comments:  On call to or tomorrow   500 mg 100 mL/hr over 60 Minutes Intravenous  Once 06/25/14 1248 06/29/14 0740   06/18/14 0600  vancomycin (VANCOCIN) IVPB 1000 mg/200 mL premix     1,000 mg 200 mL/hr over 60 Minutes Intravenous Every 24 hours 06/17/14 2234     06/17/14 2100  ciprofloxacin (CIPRO) IVPB 400 mg     400 mg 200 mL/hr over 60 Minutes Intravenous Every 12  hours 06/17/14 1816     06/17/14 1700  metroNIDAZOLE (FLAGYL) IVPB 500 mg     500 mg 100 mL/hr over 60 Minutes Intravenous Every 8 hours 06/17/14 1658        Assessment/Plan: s/p Procedure(s): EXPLORATORY LAPAROTOMY (N/A) TRANSVERSE COLECTOMY/COLOSTOMY (N/A) NEW GASTROSTOMY TUBE (N/A) Start tube feeds today. Continue dressing changes May wean off TNA tomorrow if tolerating tube feeds.   LOS: 12 days    Dexter Sauser K. 06/29/2014

## 2014-06-29 NOTE — Progress Notes (Signed)
PARENTERAL NUTRITION CONSULT NOTE   Pharmacy Consult for TPN Indication: Perforated colon  Allergies  Allergen Reactions  . Namenda [Memantine Hcl] Other (See Comments)    "hallucinations"   . Codeine Other (See Comments)    Unknown. Listed on [facility]  . Penicillins Other (See Comments)    Unknown; listed on [facility]    Patient Measurements: Height: 5' 1.02" (155 cm) Weight: 149 lb 4 oz (67.7 kg) IBW/kg (Calculated) : 47.86   Vital Signs: Temp: 98.6 F (37 C) (08/17 0437) Temp src: Axillary (08/17 0437) BP: 95/57 mmHg (08/17 0437) Pulse Rate: 103 (08/17 0437) Intake/Output from previous day: 08/16 0701 - 08/17 0700 In: 2543.5 [I.V.:199.8; IV Piggyback:700; TPN:1643.7] Out: 2760 [Urine:2500; Drains:260] Intake/Output from this shift:    Labs:  Recent Labs  06/28/14 0402 06/29/14 0513  WBC 18.3* 12.9*  HGB 8.7* 8.4*  HCT 28.1* 27.0*  PLT 287 309     Recent Labs  06/27/14 0325 06/28/14 0402 06/29/14 0513  NA 143 136* 142  K 3.3* 4.8 3.6*  CL 99 95* 99  CO2 35* 31 37*  GLUCOSE 110* 127* 111*  BUN 27* 34* 32*  CREATININE 0.41* 0.51 0.43*  CALCIUM 8.8 8.8 8.9  MG 1.8 2.1 2.0  PHOS 3.2 3.2 3.5  PROT  --   --  6.1  ALBUMIN  --   --  2.6*  AST  --   --  16  ALT  --   --  13  ALKPHOS  --   --  82  BILITOT  --   --  0.3   Estimated Creatinine Clearance: 56.8 ml/min (by C-G formula based on Cr of 0.43).    Recent Labs  06/28/14 1942 06/29/14 0008 06/29/14 0429  GLUCAP 136* 145* 117*    Insulin Requirements in the past 24 hours:  4 units SSI + 13 units regular insulin in TPN bag  Current Nutrition:  Clinimix E 5/15 at 22mL/hr + lipid 20% emulsion at 60mL/hr- provides 96g protein and 1603 kcal daily- meeting 100% of needs  Nutritional Goals:   1400-1600 kCal, 90-10 grams of protein per day (goal change per RD note 8/12)  Assessment: 73 y/o F from [name] with chronic trach presents with draining PEG tube found to be  trans-colonic.  GI: Pt on TF at [name] with prealbumin 19.2 on 8/5. Current prealbumin 20.9. Started TPN in anticipation of prolonged inability to receive TF post-op. POD#3 colectomy with colostomy and new gastrostomy tube- gastrotomy site and open wound. Per surgery, not to use G-tube until today (8/17). Will f/u restart of TFs  Endo: DM.  She is  on chronic steroids- currently on stress dose IV HC- 50mg  IV q8h. CBGs/24h 104-145- well controlled with SSI + insulin in TPN bag  Lytes: K 3.6, Mag 2, Phos 3.5, CorCa 9.8, will plan to replace potassium this morning   Renal: Scr 0.43 << 0.51, CrCl hard to estimate given poor muscle mass. UOP 1.5 mL/kg/hr- increased  Pulm: COPD, chronic trach on vent support.  Cards: Hx HTN. BP soft, HR normal-tachy- currently on no CV meds.  Hepatobil: LFT's WNL, alb 2.6. Trigs 83 on 8/11  Neuro: anxiety, dementia, lumbar compression fx.   ID: Vanco/Flagyl/Cipro (all since 8/4) for intraabdominal process. Afebrile. WBC 12.9 << 18.3 . Patient is on steroids, but WBC have continued to increase.  Best Practices: IV PPI, SCDs  TPN Access: 8/6 double-lumen PICC line  TPN day#: 7 (8/10 >> current)  Plan:  1. Continue Clinimix E 5/15 at 7580ml/hr + lipid 20% emulsion at 495ml/hr- this is at goal rate 2. Continue daily multivitamin and trace elements in TPN 3. Continue with 13 units regular insulin in TPN bag 4. Continue CBGs and SSI as ordered 5. KCl 10 mEq IV x 4 runs this morning 6. TPN labs as ordered 7. Follow surgery's plans and ability to start TF 8. F/u start of Lovenox as per surgery's plans  Georgina PillionElizabeth Mackinzee Roszak, PharmD, BCPS Clinical Pharmacist Pager: [phone] 06/29/2014 7:47 AM

## 2014-06-29 NOTE — Progress Notes (Addendum)
NUTRITION CONSULT/FOLLOW UP  DOCUMENTATION CODES Per approved criteria  -Severe malnutrition in the context of chronic illness   INTERVENTION: Initiate Osmolite 1.2 formula at 10 ml/hr and increase by 10 ml every 6 hours to goal rate of 40 ml/hr with Prostat liquid protein 30 ml TID via tube to provide 1452 kcals, 98 gm protein, 787 ml of free water RD to follow for nutrition care plan  NUTRITION DIAGNOSIS: Inadequate oral intake related to inability to eat as evidenced by NPO status with PEG for TF, ongoing   Goal: Intake to meet >90% of estimated nutrition needs, met  Monitor:  TF regimen & tolerance, respiratory status, weight, labs, I/O's  ASSESSMENT: 73 y.o. F, [name] resident on chronic trach/vent, brought to ED for concerns that PEG tube had perforated her colon. Per reports, PEG has been draining chronically, some of drainage thought to be stool. Surgeon at [facility] was concerned that PEG may be transversing colon into stomach. Pt was brought to ED for evaluation of "perforated viscus"; however, upon arrival to [facility] ED, this was determined to not be the case.   Per review of PTA, patient received bolus TF via PEG with Isosource 1.5 300 ml every 6 hours with free water flushes 30 ml/h to provide 1800 kcals, 82 gm protein, 1637 ml free water daily. Also took some PO's, pureed foods and nectar thick liquids.  8/10:  RD consulted for new TPN due to perforated colon.  Patient is currently on ventilator support -- trach  MV: 5.1 L/min  Temp (24hrs), Avg:98.2 F (36.8 C), Min:97.5 F (36.4 C), Max:98.9 F (37.2 C)  Patient is receiving TPN with Clinimix E 5/15 @ 40 ml/hr and lipids @ 10 ml/hr. Provides 1162 kcal and 48 grams protein per day. Meets 83% minimum estimated energy needs and 68% minimum estimated protein needs.  8/12:  Surgery notes reviewed.  Pt's family still undecided whether or not to purse surgical intervention.  Patient is receiving TPN with Clinimix E 5/15 @ 60  ml/hr and lipids @ 10 ml/hr. Provides 1502 kcal and 72 grams protein per day. Meets 100% minimum re-estimated energy needs and 80% minimum re-estimated protein needs.  8/17:  Pt s/p colectomy, gastrectomy, open Stamm gastrostomy and end colostomy 8/14.  Patient remains on ventilator support -- trach MV: 10.9 L/min Temp (24hrs), Avg:97.9 F (36.6 C), Min:97.4 F (36.3 C), Max:98.6 F (37 C)   RD consulted for TF re-initiation & management.  Plan is to wean TPN as TF advanced to goal rate.  Height: Ht Readings from Last 1 Encounters:  06/20/14 5' 1.02" (1.55 m)    Weight: Wt Readings from Last 1 Encounters:  06/29/14 149 lb 4 oz (67.7 kg)    BMI:  Body mass index is 28.18 kg/(m^2).   Re-estimated Nutritional Needs: Kcal: 1400-1600 Protein: 90-100 gm Fluid: >/= 1.5 L  Skin: Stage IV sacral ulcer, abdominal wound  Diet Order: NPO   Intake/Output Summary (Last 24 hours) at 06/29/14 1213 Last data filed at 06/29/14 0700  Gross per 24 hour  Intake 2763.5 ml  Output   2260 ml  Net  503.5 ml   Labs:   Recent Labs Lab 06/27/14 0325 06/28/14 0402 06/29/14 0513  NA 143 136* 142  K 3.3* 4.8 3.6*  CL 99 95* 99  CO2 35* 31 37*  BUN 27* 34* 32*  CREATININE 0.41* 0.51 0.43*  CALCIUM 8.8 8.8 8.9  MG 1.8 2.1 2.0  PHOS 3.2 3.2 3.5  GLUCOSE 110* 127* 111*  CBG (last 3)   Recent Labs  06/29/14 0429 06/29/14 0757 06/29/14 1157  GLUCAP 117* 170* 122*    Scheduled Meds: . antiseptic oral rinse  7 mL Mouth Rinse QID  . chlorhexidine  15 mL Mouth Rinse BID  . ciprofloxacin  400 mg Intravenous Q12H  . hydrocortisone sod succinate (SOLU-CORTEF) inj  25 mg Intravenous Q12H  . insulin aspart  0-9 Units Subcutaneous 6 times per day  . metronidazole  500 mg Intravenous Q8H  . pantoprazole (PROTONIX) IV  40 mg Intravenous Q24H  . potassium chloride  10 mEq Intravenous Q1 Hr x 4  . vancomycin  1,000 mg Intravenous Q24H    Continuous Infusions: . sodium chloride  500 mL (06/29/14 0701)  . Marland KitchenTPN (CLINIMIX-E) Adult 80 mL/hr at 06/28/14 1735   And  . fat emulsion 120 mL (06/28/14 1735)  . Marland KitchenTPN (CLINIMIX-E) Adult     And  . fat emulsion    . feeding supplement (JEVITY 1.2 CAL)      Past Medical History  Diagnosis Date  . Hypertension   . 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 Arthritis   . Anxiety   . Cachexia 12/2013  . DM type 2 (diabetes mellitus, type 2)   . 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 Anemia 12/2013  . Dementia   . Compression fracture of lumbar vertebra, non-traumatic 12/2013    L3 and L1.     Past Surgical History  Procedure Laterality Date  . Vaginal hysterectomy      ? Abdominal vs. Vaginal    Arthur Holms, RD, LDN Pager #: [phone] After-Hours Pager #: [phone]

## 2014-06-29 NOTE — Consult Note (Signed)
WOC ostomy follow up Stoma type/location: lower right quadrant colostomy below umbilicus; CCS is following for plan of care for abdominal wound Stomal assessment/size: 1 and three quarter inches, above skin, pink , moist, draining Peristomal assessment: skin intact Output : 20 cc yellow brown stool Ostomy pouching:2pc.  Education provided: Pt's son at bedside.  Educated pt's son on ostomy bag removal, stoma assessment, bag changes and application of new bags.  Pt's son verbalized understanding, will need reinforcement of skills. Pt is trach vent dependent, will require total assistance with pouching activities. Charlesetta Garibaldi, RN, BSN, MSN-Student Cammie Mcgee MSN, RN, [place], [place], [place] [phone]

## 2014-06-29 NOTE — Progress Notes (Signed)
Tube feeding started, Osmolite 1.2cal running at 44ml per hour per MD order as pt tolerates. Residuals to be checked Q6hrs.  Bobby Barton M. Derrell Lolling, RN, BSN 06/29/2014 6:47 PM

## 2014-06-29 NOTE — Progress Notes (Signed)
ANTIBIOTIC CONSULT NOTE - FOLLOW UP  Pharmacy Consult for Vancomycin, Cipro Indication: Possible intra-abdominal infection/cellulitis  Allergies  Allergen Reactions  . Namenda [Memantine Hcl] Other (See Comments)    "hallucinations"   . Codeine Other (See Comments)    Unknown. [facility]  . Penicillins Other (See Comments)    Unknown; [facility]    Patient Measurements: Height: 5' 1.02" (155 cm) Weight: 149 lb 4 oz (67.7 kg) IBW/kg (Calculated) : 47.86 Vital Signs: Temp: 98.6 F (37 C) (08/17 0437) Temp src: Axillary (08/17 0437) BP: 95/57 mmHg (08/17 0437) Pulse Rate: 103 (08/17 0437) Intake/Output from previous day: 08/16 0701 - 08/17 0700 In: 2543.5 [I.V.:199.8; IV Piggyback:700; TPN:1643.7] Out: 2760 [Urine:2500; Drains:260] Intake/Output from this shift: Total I/O In: 1280 [I.V.:100; IV Piggyback:500; TPN:680] Out: 760 [Urine:700; Drains:60] Labs:  Recent Labs  06/27/14 0325 06/28/14 0402 06/29/14 0513  WBC  --  18.3* 12.9*  HGB  --  8.7* 8.4*  PLT  --  287 309  CREATININE 0.41* 0.51 0.43*   Estimated Creatinine Clearance: 56.8 ml/min (by C-G formula based on Cr of 0.43).  Recent Labs  06/29/14 0513  VANCOTROUGH 17.3     Microbiology: Recent Results (from the past 720 hour(s))  MRSA PCR SCREENING     Status: Abnormal   Collection Time    06/17/14  6:28 PM      Result Value Ref Range Status   MRSA by PCR POSITIVE (*) NEGATIVE Final   Comment:            The GeneXpert MRSA Assay (FDA     approved for NASAL specimens     only), is one component of a     comprehensive MRSA colonization     surveillance program. It is not     intended to diagnose MRSA     infection nor to guide or     monitor treatment for     MRSA infections.     RESULT CALLED TO, READ BACK BY AND VERIFIED WITH:     A BECK RN 2031 06/17/14 A BROWNING  SURGICAL PCR SCREEN     Status: Abnormal   Collection Time    06/26/14  8:20 AM       Result Value Ref Range Status   MRSA, PCR POSITIVE (*) NEGATIVE Final   Staphylococcus aureus POSITIVE (*) NEGATIVE Final   Comment:            The Xpert SA Assay (FDA     approved for NASAL specimens     in patients over 71 years of age),     is one component of     a comprehensive surveillance     program.  Test performance has     been validated by [facility] for patients greater     than or equal to 71 year old.     It is not intended     to diagnose infection nor to     guide or monitor treatment.    Anti-infectives   Start     Dose/Rate Route Frequency Ordered Stop   06/25/14 1300  metroNIDAZOLE (FLAGYL) IVPB 500 mg    Comments:  On call to or tomorrow   500 mg 100 mL/hr over 60 Minutes Intravenous  Once 06/25/14 1248     06/18/14 0600  vancomycin (VANCOCIN) IVPB 1000 mg/200 mL premix     1,000 mg 200 mL/hr over 60  Minutes Intravenous Every 24 hours 06/17/14 2234     06/17/14 2100  ciprofloxacin (CIPRO) IVPB 400 mg     400 mg 200 mL/hr over 60 Minutes Intravenous Every 12 hours 06/17/14 1816     06/17/14 1700  metroNIDAZOLE (FLAGYL) IVPB 500 mg     500 mg 100 mL/hr over 60 Minutes Intravenous Every 8 hours 06/17/14 1658       Assessment: 73 YOF on  of vancomycin, Cipro, and Flagyl s/p bowel surgery/colostomy and sacral decubitus ulcer. Trough has been therapeutic on vancomycin 1g IV q24h. SCr has remained stable however difficult to interpret given low muscle mass of patient.  Patient remains afebrile and WBC elevated but trended down overnight to 12.   No cultures available.   VT this am was therapeutic at 17.3, no rate adjustment warranted.  Goal of Therapy:  Vancomycin trough level 15-20 mcg/ml  Plan:  1. Continue vancomycin 1g IV q24 - will recheck trough level next week if continues 2. Continue Cipro 400mg  IV q12h. 3. Follow-up surgical plans and length of therapy.   Sheppard Coil PharmD., BCPS Clinical Pharmacist Pager [phone] 06/29/2014 6:13  AM

## 2014-06-29 NOTE — Progress Notes (Signed)
Patient discussed with Cammie Mcgee, RNCM.  Patient still have some LTAC Medicare days left.  MD: Please consider d/c of patient to [name]. Patient can then be moved to SNF unit at [name] when medically appropriate.  Currently no available SNF beds at [facility].  CSW will continue to monitor and assist with d/c plan.  Lorri Frederick. Jaci Lazier, [place] [phone]

## 2014-06-29 NOTE — Progress Notes (Signed)
PULMONARY / CRITICAL CARE MEDICINE   Name: Katelyn Rojas MRN: [id number] DOB: 04-07-1941    ADMISSION DATE:  06/17/2014 CONSULTATION DATE:  06/17/14  REFERRING MD :  [name] >> ED  CHIEF COMPLAINT: ? Perforated viscus   INITIAL PRESENTATION: 73 y.o. F, [name] resident on chronic trach, brought to ED for concerns that PEG tube had perforated her colon. Per reports, PEG has been draining chronically, some of drainage thought to be stool. Surgeon at [name] was concerned that PEG may be transversing colon into stomach. Pt was brought to ED for evaluation of "perforated viscus"; however, upon arrival to [facility] ED, this was determined to not be the case. PCCM was called for admission.   STUDIES:  8/5 KUB >>> no free air, generalized bowel distention, stable gastrostomy.  8/6 >> No surgery needed, IR to evaluate the G-tube.  8/6 >> BE shows Gastrostomy tube traverses distal transverse segment. Some contrast noted to exit at skin entry site along G tube. No obstruction  SIGNIFICANT EVENTS:  8/5 admit >>> transferred from [name], concerns of perforated viscus, imaging at [facility] ED suggested this to not be the case.  8/7>> Surgery discussing with family about revision of PEG tube, Dr. Dwain Sarna to meet with family on 8/10 to discuss surgical options. Plan to go to or 8/10 possibly.  8/10 TPN started. Surgery planning on to OR soon. 8/12 family has not decided about surgery yet. 8/13 apparently family has decided to proceed with surgery 8/14- Ex lap, new G tube placed, colectomy, colostomy  SUBJECTIVE:  Stable on vent overnight, no increased wob.  Alert and interactive.  VITAL SIGNS: Temp:  [97.3 F (36.3 C)-98.6 F (37 C)] 98 F (36.7 C) (08/17 0753) Pulse Rate:  [85-117] 86 (08/17 0753) Resp:  [11-24] 12 (08/17 0753) BP: (90-156)/(47-71) 156/67 mmHg (08/17 0847) SpO2:  [94 %-99 %] 99 % (08/17 0753) FiO2 (%):  [30 %] 30 % (08/17 0847) Weight:  [149 lb 4 oz (67.7 kg)] 149 lb 4 oz (67.7 kg)  (08/17 0437) HEMODYNAMICS:   VENTILATOR SETTINGS: Vent Mode:  [-] PRVC FiO2 (%):  [30 %] 30 % Set Rate:  [14 bmp] 14 bmp Vt Set:  [400 mL] 400 mL PEEP:  [5 cmH20] 5 cmH20 Plateau Pressure:  [15 cmH20-28 cmH20] 17 cmH20 INTAKE / OUTPUT:  Intake/Output Summary (Last 24 hours) at 06/29/14 6073 Last data filed at 06/29/14 0700  Gross per 24 hour  Intake 2763.5 ml  Output   2260 ml  Net  503.5 ml   PHYSICAL EXAMINATION: General: chronically ill appearing on vent. HEENT: trach in place, clean site, no JVD or LN PULM: decreased bs but fairly clear. CV: minimal tachy but regular, no murmur AB: postop belly, decreased bs Ext: contractures, edema noted Neuro: awake, mouths words, appropriate  LABS:  CBC  Recent Labs Lab 06/25/14 2200 06/28/14 0402 06/29/14 0513  WBC 17.6* 18.3* 12.9*  HGB 10.2* 8.7* 8.4*  HCT 32.9* 28.1* 27.0*  PLT 398 287 309   Coag's  Recent Labs Lab 06/23/14 0525 06/26/14 0500  APTT 26  --   INR 1.28 1.21   BMET  Recent Labs Lab 06/27/14 0325 06/28/14 0402 06/29/14 0513  NA 143 136* 142  K 3.3* 4.8 3.6*  CL 99 95* 99  CO2 35* 31 37*  BUN 27* 34* 32*  CREATININE 0.41* 0.51 0.43*  GLUCOSE 110* 127* 111*   Electrolytes  Recent Labs Lab 06/27/14 0325 06/28/14 0402 06/29/14 0513  CALCIUM 8.8 8.8 8.9  MG  1.8 2.1 2.0  PHOS 3.2 3.2 3.5   Sepsis Markers No results found for this basename: LATICACIDVEN, PROCALCITON, O2SATVEN,  in the last 168 hours ABG No results found for this basename: PHART, PCO2ART, PO2ART,  in the last 168 hours Liver Enzymes  Recent Labs Lab 06/23/14 0525 06/25/14 0600 06/29/14 0513  AST 12 16 16   ALT 15 11 13   ALKPHOS 99 105 82  BILITOT <0.2* <0.2* 0.3  ALBUMIN 2.5* 2.7* 2.6*   Cardiac Enzymes No results found for this basename: TROPONINI, PROBNP,  in the last 168 hours Glucose  Recent Labs Lab 06/28/14 1203 06/28/14 1629 06/28/14 1942 06/29/14 0008 06/29/14 0429 06/29/14 0757  GLUCAP  122* 112* 136* 145* 117* 170*    Imaging No results found.   ASSESSMENT / PLAN:  PULMONARY  Trach 04/01/14  A:  Chronic respiratory failure  COPD  Tracheostomy Status  P:  Begin PS trials today, will attempt to advance as able.  INFECTIOUS  A:  Hx MRSA and enterococcus bacteremia Hx Tracheitis (cultures + for Alcaligenes bacteria)  Sacra decubitus ulcer  Leukocytosis post-op, on steroids stress coverage P:  Abx: Vanc, Cipro, Flagyl (from [facility]).  Monitor fever curve / WBC's.  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  P:  SSI.  Stress steroids. Decrease to 25 mg IV q12 8/17 then titrate over the next few days.  Alyson ReedyWesam G. Yacoub, M.D. [facility] Pulmonary/Critical Care Medicine. Pager: [phone]. After hours pager: [phone].

## 2014-06-30 LAB — BASIC METABOLIC PANEL
Anion gap: 8 (ref 5–15)
BUN: 39 mg/dL — ABNORMAL HIGH (ref 6–23)
CALCIUM: 8.7 mg/dL (ref 8.4–10.5)
CO2: 35 mEq/L — ABNORMAL HIGH (ref 19–32)
Chloride: 100 mEq/L (ref 96–112)
Creatinine, Ser: 0.45 mg/dL — ABNORMAL LOW (ref 0.50–1.10)
GFR calc Af Amer: >90 mL/min (ref >90)
Glucose, Bld: 143 mg/dL — ABNORMAL HIGH (ref 70–99)
Potassium: 3.8 mEq/L (ref 3.7–5.3)
SODIUM: 143 meq/L (ref 137–147)

## 2014-06-30 LAB — GLUCOSE, CAPILLARY
GLUCOSE-CAPILLARY: 111 mg/dL — AB (ref 70–99)
GLUCOSE-CAPILLARY: 158 mg/dL — AB (ref 70–99)
GLUCOSE-CAPILLARY: 191 mg/dL — AB (ref 70–99)
Glucose-Capillary: 154 mg/dL — ABNORMAL HIGH (ref 70–99)
Glucose-Capillary: 112 mg/dL — ABNORMAL HIGH (ref 70–99)

## 2014-06-30 LAB — CBC
HCT: 28.4 % — ABNORMAL LOW (ref 36.0–46.0)
HEMOGLOBIN: 8.7 g/dL — AB (ref 12.0–15.0)
MCH: 31.1 pg (ref 26.0–34.0)
MCHC: 30.6 g/dL (ref 30.0–36.0)
MCV: 101.4 fL — AB (ref 78.0–100.0)
Platelets: 306 10*3/uL (ref 150–400)
RBC: 2.8 MIL/uL — ABNORMAL LOW (ref 3.87–5.11)
RDW: 14.8 % (ref 11.5–15.5)
WBC: 10.6 10*3/uL — ABNORMAL HIGH (ref 4.0–10.5)

## 2014-06-30 LAB — PREALBUMIN: Prealbumin: 18.3 mg/dL (ref 17.0–34.0)

## 2014-06-30 LAB — MAGNESIUM: MAGNESIUM: 2 mg/dL (ref 1.5–2.5)

## 2014-06-30 LAB — PHOSPHORUS: Phosphorus: 3.4 mg/dL (ref 2.3–4.6)

## 2014-06-30 MED ORDER — INSULIN ASPART 100 UNIT/ML ~~LOC~~ SOLN: 0.0000 [IU] | SUBCUTANEOUS | Status: DC

## 2014-06-30 MED ORDER — FENTANYL CITRATE 0.05 MG/ML IJ SOLN: 50.0000 ug | INTRAMUSCULAR | Status: DC | PRN

## 2014-06-30 MED ORDER — OSMOLITE 1.2 CAL PO LIQD: 1000.0000 mL | ORAL | Status: DC

## 2014-06-30 MED ORDER — POTASSIUM CHLORIDE 10 MEQ/50ML IV SOLN
10.0000 meq | INTRAVENOUS | Status: AC
  Administered 2014-06-30 (×2): 10 meq via INTRAVENOUS
  Filled 2014-06-30: qty 50

## 2014-06-30 MED ORDER — PANTOPRAZOLE SODIUM 40 MG IV SOLR: 40.0000 mg | INTRAVENOUS | Status: DC

## 2014-06-30 MED ORDER — PRO-STAT SUGAR FREE PO LIQD: 30.0000 mL | Freq: Three times a day (TID) | ORAL | Status: DC

## 2014-06-30 MED ORDER — VANCOMYCIN HCL IN DEXTROSE 1-5 GM/200ML-% IV SOLN
1000.0000 mg | INTRAVENOUS | Status: DC
Start: 2014-06-30 — End: 2014-07-23

## 2014-06-30 MED ORDER — HYDROCORTISONE NA SUCCINATE PF 100 MG IJ SOLR
50.0000 mg | Freq: Four times a day (QID) | INTRAMUSCULAR | Status: DC
  Administered 2014-06-30 (×3): 50 mg via INTRAVENOUS
  Filled 2014-06-30 (×5): qty 1

## 2014-06-30 MED ORDER — SODIUM CHLORIDE 0.45 % IV SOLN: INTRAVENOUS | Status: DC

## 2014-06-30 MED ORDER — LORAZEPAM 2 MG/ML IJ SOLN: 1.0000 mg | INTRAMUSCULAR | Status: DC | PRN

## 2014-06-30 MED ORDER — HYDROCORTISONE NA SUCCINATE PF 100 MG IJ SOLR: 50.0000 mg | Freq: Four times a day (QID) | INTRAMUSCULAR | Status: DC

## 2014-06-30 NOTE — Progress Notes (Signed)
Report called to [facility] at [facility].

## 2014-06-30 NOTE — Discharge Summary (Signed)
Physician Discharge Summary       Patient ID: Katelyn Rojas MRN: [id number] DOB/AGE: 1941/09/16 73 y.o.  Admit date: 06/17/2014 Discharge date: 06/30/2014  Discharge Diagnoses:   Chronic respiratory failure  COPD  Tracheostomy status  History of MRSA and enterococcus bacteremia  Tracheitis (cultures + for Alcaligenes bacteria)  Sacra decubitus ulcer DM  Perforated viscous  S/p exploratory lap with new G-tube placed, colectomy and colostomy   Detailed Hospital Course:   73 y.o. F, [name] resident on chronic trach, brought to ED for concerns that PEG tube had perforated her colon. Per reports, PEG has been draining chronically, some of drainage thought to be stool. Surgeon at [name] was concerned that PEG may be transversing colon into stomach. Pt was brought to ED for evaluation of "perforated viscus"; however, upon arrival to [facility] ED, this was determined to not be the case. PCCM was called for admission. Both GI and surgical services were consulted. GI did not feel she was a candidate for colonoscopy. Eventually after multiple discussions with family she underwent #1 segmental transverse colectomy #2 wedge gastrectomy  #3 open Stamm gastrostomy #4 End colostomy on 8/14. Post-op wound ostomy care was consulted for ostomy care, and surgical wound care. Tube feeds were started. She was deemed ready for d/c to LTAC setting as of 8/18.      Discharge Plan by active problems   S/p exploratory lap with new G-tube placed, colectomy and colostomy for malpositioned PEG tube Plan continue with TF  BID wet to dry dressing changes Wound care  Surgery will set up f/u in 2 weeks   Chronic respiratory failure  COPD  Tracheostomy Status  Plan:  Attempt to advance to TC today if able with mandatory vent support at night.  Titrate O2 down as able.  Hx MRSA and enterococcus bacteremia Hx Tracheitis (cultures + for Alcaligenes bacteria)  Sacra decubitus ulcer  Leukocytosis post-op, on  steroids stress coverage  Plan:  Abx: Vanc, Cipro, Flagyl (from [facility]).  Monitor fever curve / WBC's.  cipro 8/5>>  Flagyl 8/5>>  Vanco 8/5>>  Low threshold empiric antifungals if spike / clinical declines  DM  relative AI - on prednisone chronically.  Hypotensive overnight  Plan:  SSI.  Stress dose steroids, wean as able   Significant Hospital tests/ studies  Consults  GI and general surgery   Discharge Exam: BP 95/62  Pulse 107  Temp(Src) 97.4 F (36.3 C) (Oral)  Resp 11  Ht 5' 1.02" (1.55 m)  Wt 66.6 kg (146 lb 13.2 oz)  BMI 27.72 kg/m2  SpO2 100%  General: chronically ill appearing on vent.  HEENT: trach in place, clean site, no JVD or LN  PULM: decreased bs but fairly clear.  CV: minimal tachy but regular, no murmur  AB: postop belly, decreased bs  Ext: contractures, edema noted  Neuro: awake, mouths words, appropriate   Labs at discharge Lab Results  Component Value Date   CREATININE 0.45* 06/30/2014   BUN 39* 06/30/2014   NA 143 06/30/2014   K 3.8 06/30/2014   CL 100 06/30/2014   CO2 35* 06/30/2014   Lab Results  Component Value Date   WBC 10.6* 06/30/2014   HGB 8.7* 06/30/2014   HCT 28.4* 06/30/2014   MCV 101.4* 06/30/2014   PLT 306 06/30/2014   Lab Results  Component Value Date   ALT 13 06/29/2014   AST 16 06/29/2014   ALKPHOS 82 06/29/2014   BILITOT 0.3 06/29/2014   Lab Results  Component  Value Date   INR 1.21 06/26/2014   INR 1.28 06/23/2014   INR 1.32 06/22/2014    Current radiology studies No results found.  Disposition:  03-Skilled Nursing Facility      Discharge Instructions   Diet - low sodium heart healthy    Complete by:  As directed      Discharge instructions    Complete by:  As directed   Resume prior vent settings     Increase activity slowly    Complete by:  As directed             Medication List    STOP taking these medications       aspirin 81 MG tablet     bacitracin ointment     docusate 50 MG/5ML liquid    Commonly known as:  COLACE     heparin 5000 UNIT/ML injection     metoprolol tartrate 25 MG tablet  Commonly known as:  LOPRESSOR     oxycodone 5 MG capsule  Commonly known as:  OXY-IR     pantoprazole 40 MG tablet  Commonly known as:  PROTONIX  Replaced by:  pantoprazole 40 MG injection     predniSONE 10 MG tablet  Commonly known as:  DELTASONE     vancomycin 250 MG capsule  Commonly known as:  VANCOCIN      TAKE these medications       acetaminophen 325 MG tablet  Commonly known as:  TYLENOL  650 mg by PEG Tube route every 6 (six) hours as needed (pain).     albuterol 108 (90 BASE) MCG/ACT inhaler  Commonly known as:  PROVENTIL HFA;VENTOLIN HFA  Inhale 1-2 puffs into the lungs every 6 (six) hours as needed for wheezing or shortness of breath.     chlorhexidine 0.12 % solution  Commonly known as:  PERIDEX  Use as directed 15 mLs in the mouth or throat 2 (two) times daily.     ciprofloxacin 400 MG/40ML Soln injection  Commonly known as:  CIPRO  Inject 400 mg into the vein 2 (two) times daily. For infection     citalopram 20 MG tablet  Commonly known as:  CELEXA  20 mg by PEG Tube route daily.     clonazePAM 0.25 MG disintegrating tablet  Commonly known as:  KLONOPIN  0.25 mg by PEG Tube route 3 (three) times daily.     donepezil 10 MG tablet  Commonly known as:  ARICEPT  10 mg by PEG Tube route at bedtime. For dementia     feeding supplement (OSMOLITE 1.2 CAL) Liqd  Place 1,000 mLs into feeding tube daily.     feeding supplement (PRO-STAT SUGAR FREE 64) Liqd  Place 30 mLs into feeding tube 3 (three) times daily.     hydrocortisone sodium succinate 100 MG Solr injection  Commonly known as:  SOLU-CORTEF  Inject 1 mL (50 mg total) into the vein every 6 (six) hours.     insulin aspart 100 UNIT/ML injection  Commonly known as:  novoLOG  Inject 0-9 Units into the skin every 4 (four) hours.     LORazepam 2 MG/ML injection  Commonly known as:  ATIVAN   Inject 0.5-1 mLs (1-2 mg total) into the vein every 3 (three) hours as needed for anxiety or sedation.     metroNIDAZOLE 50 mg/ml oral suspension  Commonly known as:  FLAGYL  Inject 500 mg into the vein every 8 (eight) hours. For infection  pantoprazole 40 MG injection  Commonly known as:  PROTONIX  Inject 40 mg into the vein daily.     sodium chloride 0.45 % solution  kvo     vancomycin 1 GM/200ML Soln  Commonly known as:  VANCOCIN  Inject 200 mLs (1,000 mg total) into the vein daily.     zinc sulfate 220 MG capsule  Give 220 mg by tube daily.         Discharged Condition: fair  Physician Statement:   The Patient was personally examined, the discharge assessment and plan has been personally reviewed and I agree with ACNP Kasheena Sambrano's assessment and plan. > 30 minutes of time have been dedicated to discharge assessment, planning and discharge instructions.   Signed: Venancio Chenier,PETE 06/30/2014, 10:57 AM

## 2014-06-30 NOTE — Progress Notes (Signed)
[facility] called will pickup after  6 pm for transfer to  [name].

## 2014-06-30 NOTE — Progress Notes (Signed)
PULMONARY / CRITICAL CARE MEDICINE   Name: Katelyn Rojas MRN: [id number] DOB: 02/10/1941    ADMISSION DATE:  06/17/2014 CONSULTATION DATE:  06/17/14  REFERRING MD :  [name] >> ED  CHIEF COMPLAINT: ? Perforated viscus   INITIAL PRESENTATION: 73 y.o. F, [name] resident on chronic trach, brought to ED for concerns that PEG tube had perforated her colon. Per reports, PEG has been draining chronically, some of drainage thought to be stool. Surgeon at [name] was concerned that PEG may be transversing colon into stomach. Pt was brought to ED for evaluation of "perforated viscus"; however, [facility], this was determined to not be the case. PCCM was called for admission.   STUDIES:  8/5 KUB >>> no free air, generalized bowel distention, stable gastrostomy.  8/6 >> No surgery needed, IR to evaluate the G-tube.  8/6 >> BE shows Gastrostomy tube traverses distal transverse segment. Some contrast noted to exit at skin entry site along G tube. No obstruction  SIGNIFICANT EVENTS:  8/5 admit >>> [facility] from [name], [facility] of perforated [facility]cus[facility] to [facility] be the case.  8/7>> Surgery discussing with family about revision of PEG tube, Dr. Dwain SarnaWakeField to meet with family on 8/10 to discuss surgical options. Plan to go to or 8/10 possibly.  8/10 TPN started. Surgery planning on to OR soon. 8/12 family has not decided about surgery yet. 8/13 apparently family has decided to proceed with surgery 8/14- Ex lap, new G tube placed, colectomy, colostomy  SUBJECTIVE:  Stable on vent overnight, no increased wob.  Alert and interactive.  VITAL SIGNS: Temp:  [97.4 F (36.3 C)-98.6 F (37 C)] 98.2 F (36.8 C) (08/18 0000) Pulse Rate:  [86-109] 96 (08/18 0400) Resp:  [11-17] 15 (08/18 0400) BP: (95-156)/(51-68) 112/51 mmHg (08/18 0400) SpO2:  [97 %-100 %] 98 % (08/18 0400) FiO2 (%):  [30 %] 30 % (08/18 0400) Weight:  [149 lb 4 oz (67.7 kg)] 149 lb 4 oz (67.7 kg)  (08/17 0437) HEMODYNAMICS:   VENTILATOR SETTINGS: Vent Mode:  [-] PRVC FiO2 (%):  [30 %] 30 % Set Rate:  [14 bmp] 14 bmp Vt Set:  [400 mL] 400 mL PEEP:  [5 cmH20] 5 cmH20 Plateau Pressure:  [17 cmH20-26 cmH20] 24 cmH20 INTAKE / OUTPUT:  Intake/Output Summary (Last 24 hours) at 06/30/14 0419 Last data filed at 06/30/14 0400  Gross per 24 hour  Intake 3026.09 ml  Output   1710 ml  Net 1316.09 ml   PHYSICAL EXAMINATION: General: chronically ill appearing on vent. HEENT: trach in place, clean site, no JVD or LN PULM: decreased bs but fairly clear. CV: minimal tachy but regular, no murmur AB: postop belly, decreased bs Ext: contractures, edema noted Neuro: awake, mouths words, appropriate  LABS:  CBC  Recent Labs Lab 06/25/14 2200 06/28/14 0402 06/29/14 0513  WBC 17.6* 18.3* 12.9*  HGB 10.2* 8.7* 8.4*  HCT 32.9* 28.1* 27.0*  PLT 398 287 309   Coag's  Recent Labs Lab 06/23/14 0525 06/26/14 0500  APTT 26  --   INR 1.28 1.21   BMET  Recent Labs Lab 06/27/14 0325 06/28/14 0402 06/29/14 0513  NA 143 136* 142  K 3.3* 4.8 3.6*  CL 99 95* 99  CO2 35* 31 37*  BUN 27* 34* 32*  CREATININE 0.41* 0.51 0.43*  GLUCOSE 110* 127* 111*   Electrolytes  Recent Labs Lab 06/27/14 0325 06/28/14 0402 06/29/14 0513  CALCIUM 8.8 8.8 8.9  MG 1.8  2.1 2.0  PHOS 3.2 3.2 3.5   Sepsis Markers No results found for this basename: LATICACIDVEN, PROCALCITON, O2SATVEN,  in the last 168 hours ABG No results found for this basename: PHART, PCO2ART, PO2ART,  in the last 168 hours Liver Enzymes  Recent Labs Lab 06/23/14 0525 06/25/14 0600 06/29/14 0513  AST 12 16 16   ALT 15 11 13   ALKPHOS 99 105 82  BILITOT <0.2* <0.2* 0.3  ALBUMIN 2.5* 2.7* 2.6*   Cardiac Enzymes No results found for this basename: TROPONINI, PROBNP,  in the last 168 hours Glucose  Recent Labs Lab 06/29/14 0429 06/29/14 0757 06/29/14 1157 06/29/14 1623 06/29/14 2045 06/30/14 0021  GLUCAP  117* 170* 122* 120* 191* 111*    Imaging No results found.   ASSESSMENT / PLAN:  PULMONARY  Trach 04/01/14  A:  Chronic respiratory failure  COPD  Tracheostomy Status  P:  Attempt to advance to TC today if able with mandatory vent support at night. Titrate O2 down as able.  INFECTIOUS  A:  Hx MRSA and enterococcus bacteremia Hx Tracheitis (cultures + for Alcaligenes bacteria)  Sacra decubitus ulcer  Leukocytosis post-op, on steroids stress coverage P:  Abx: Vanc, Cipro, Flagyl (from [facility]).  Monitor fever curve / WBC's.  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  Hypotensive overnight P:  SSI.  Increase steroids to full stress dose given overnight events.  [name], M.D. [facility] Pulmonary/Critical Care Medicine. Pager: [phone]. After hours pager: [phone].

## 2014-06-30 NOTE — Progress Notes (Signed)
Patient ID: Katelyn Rojas, female   DOB: 14-Jun-1941, 73 y.o.   MRN: [id number]     [place]      [address] [place], [place] [address]    Phone: [phone] FAX: [phone]     Subjective: No changes.  Afebrile.    Objective:  Vital signs:  Filed Vitals:   06/30/14 0454 06/30/14 0500 06/30/14 0830 06/30/14 0845  BP:   95/62 95/62  Pulse: 89  79 107  Temp:   97.4 F (36.3 C)   TempSrc:   Oral   Resp: 18  11   Height:      Weight:  146 lb 13.2 oz (66.6 kg)    SpO2: 98%  100% 100%    Last BM Date: 06/29/14  Intake/Output   Yesterday:  08/17 0701 - 08/18 0700 In: 3226.1 [I.V.:230; NG/GT:202.5; IV Piggyback:500; TPN:2293.6] Out: 1300 [Urine:1300] This shift:  Total I/O In: -  Out: 175 [Urine:175]   Physical Exam: General: Pt awake/alert and in no acute distress Abdomen: Soft.  Nondistended.  Appropriately tender.  Midline wound is beefy red, superficial, without any purulent drainage.  LUQ feeding tube in place, no surrounding erythema.  Rt sided colostomy with loose stool.  No evidence of peritonitis.  No incarcerated hernias.    Problem List:   Active Problems:   Abdominal pain   Congestive dilated cardiomyopathy   Colon perforation    Results:   Labs: Results for orders placed during the hospital encounter of 06/17/14 (from the past 48 hour(s))  GLUCOSE, CAPILLARY     Status: Abnormal   Collection Time    06/28/14 12:03 PM      Result Value Ref Range   Glucose-Capillary 122 (*) 70 - 99 mg/dL  GLUCOSE, CAPILLARY     Status: Abnormal   Collection Time    06/28/14  4:29 PM      Result Value Ref Range   Glucose-Capillary 112 (*) 70 - 99 mg/dL  GLUCOSE, CAPILLARY     Status: Abnormal   Collection Time    06/28/14  7:42 PM      Result Value Ref Range   Glucose-Capillary 136 (*) 70 - 99 mg/dL  GLUCOSE, CAPILLARY     Status: Abnormal   Collection Time    06/29/14 12:08 AM      Result Value  Ref Range   Glucose-Capillary 145 (*) 70 - 99 mg/dL  GLUCOSE, CAPILLARY     Status: Abnormal   Collection Time    06/29/14  4:29 AM      Result Value Ref Range   Glucose-Capillary 117 (*) 70 - 99 mg/dL  CBC     Status: Abnormal   Collection Time    06/29/14  5:13 AM      Result Value Ref Range   WBC 12.9 (*) 4.0 - 10.5 K/uL   RBC 2.73 (*) 3.87 - 5.11 MIL/uL   Hemoglobin 8.4 (*) 12.0 - 15.0 g/dL   HCT 27.0 (*) 36.0 - 46.0 %   MCV 98.9  78.0 - 100.0 fL   MCH 30.8  26.0 - 34.0 pg   MCHC 31.1  30.0 - 36.0 g/dL   RDW 15.2  11.5 - 15.5 %   Platelets 309  150 - 400 K/uL  VANCOMYCIN, TROUGH     Status: None   Collection Time    06/29/14  5:13 AM      Result Value Ref Range   Vancomycin Tr  17.3  10.0 - 20.0 ug/mL  COMPREHENSIVE METABOLIC PANEL     Status: Abnormal   Collection Time    06/29/14  5:13 AM      Result Value Ref Range   Sodium 142  137 - 147 mEq/L   Potassium 3.6 (*) 3.7 - 5.3 mEq/L   Comment: DELTA CHECK NOTED     NO VISIBLE HEMOLYSIS   Chloride 99  96 - 112 mEq/L   CO2 37 (*) 19 - 32 mEq/L   Glucose, Bld 111 (*) 70 - 99 mg/dL   BUN 32 (*) 6 - 23 mg/dL   Creatinine, Ser 0.43 (*) 0.50 - 1.10 mg/dL   Calcium 8.9  8.4 - 10.5 mg/dL   Total Protein 6.1  6.0 - 8.3 g/dL   Albumin 2.6 (*) 3.5 - 5.2 g/dL   AST 16  0 - 37 U/L   ALT 13  0 - 35 U/L   Alkaline Phosphatase 82  39 - 117 U/L   Total Bilirubin 0.3  0.3 - 1.2 mg/dL   GFR calc non Af Amer >90  >90 mL/min   GFR calc Af Amer >90  >90 mL/min   Comment: (NOTE)     The eGFR has been calculated using the CKD EPI equation.     This calculation has not been validated in all clinical situations.     eGFR's persistently <90 mL/min signify possible Chronic Kidney     Disease.   Anion gap 6  5 - 15  MAGNESIUM     Status: None   Collection Time    06/29/14  5:13 AM      Result Value Ref Range   Magnesium 2.0  1.5 - 2.5 mg/dL  PHOSPHORUS     Status: None   Collection Time    06/29/14  5:13 AM      Result Value Ref  Range   Phosphorus 3.5  2.3 - 4.6 mg/dL  GLUCOSE, CAPILLARY     Status: Abnormal   Collection Time    06/29/14  7:57 AM      Result Value Ref Range   Glucose-Capillary 170 (*) 70 - 99 mg/dL  GLUCOSE, CAPILLARY     Status: Abnormal   Collection Time    06/29/14 11:57 AM      Result Value Ref Range   Glucose-Capillary 122 (*) 70 - 99 mg/dL  TRIGLYCERIDES     Status: None   Collection Time    06/29/14  1:11 PM      Result Value Ref Range   Triglycerides 25  <150 mg/dL  PREALBUMIN     Status: None   Collection Time    06/29/14  1:11 PM      Result Value Ref Range   Prealbumin 18.3  17.0 - 34.0 mg/dL   Comment: Performed at [facility], CAPILLARY     Status: Abnormal   Collection Time    06/29/14  4:23 PM      Result Value Ref Range   Glucose-Capillary 120 (*) 70 - 99 mg/dL  GLUCOSE, CAPILLARY     Status: Abnormal   Collection Time    06/29/14  8:45 PM      Result Value Ref Range   Glucose-Capillary 191 (*) 70 - 99 mg/dL  GLUCOSE, CAPILLARY     Status: Abnormal   Collection Time    06/30/14 12:21 AM      Result Value Ref Range   Glucose-Capillary 111 (*) 70 -  99 mg/dL  GLUCOSE, CAPILLARY     Status: Abnormal   Collection Time    06/30/14  4:24 AM      Result Value Ref Range   Glucose-Capillary 154 (*) 70 - 99 mg/dL  CBC     Status: Abnormal   Collection Time    06/30/14  5:15 AM      Result Value Ref Range   WBC 10.6 (*) 4.0 - 10.5 K/uL   RBC 2.80 (*) 3.87 - 5.11 MIL/uL   Hemoglobin 8.7 (*) 12.0 - 15.0 g/dL   HCT 28.4 (*) 36.0 - 46.0 %   MCV 101.4 (*) 78.0 - 100.0 fL   MCH 31.1  26.0 - 34.0 pg   MCHC 30.6  30.0 - 36.0 g/dL   RDW 14.8  11.5 - 15.5 %   Platelets 306  150 - 400 K/uL  BASIC METABOLIC PANEL     Status: Abnormal   Collection Time    06/30/14  5:15 AM      Result Value Ref Range   Sodium 143  137 - 147 mEq/L   Potassium 3.8  3.7 - 5.3 mEq/L   Chloride 100  96 - 112 mEq/L   CO2 35 (*) 19 - 32 mEq/L   Glucose, Bld 143 (*) 70 - 99  mg/dL   BUN 39 (*) 6 - 23 mg/dL   Creatinine, Ser 0.45 (*) 0.50 - 1.10 mg/dL   Calcium 8.7  8.4 - 10.5 mg/dL   GFR calc non Af Amer >90  >90 mL/min   GFR calc Af Amer >90  >90 mL/min   Comment: (NOTE)     The eGFR has been calculated using the CKD EPI equation.     This calculation has not been validated in all clinical situations.     eGFR's persistently <90 mL/min signify possible Chronic Kidney     Disease.   Anion gap 8  5 - 15  MAGNESIUM     Status: None   Collection Time    06/30/14  5:15 AM      Result Value Ref Range   Magnesium 2.0  1.5 - 2.5 mg/dL  PHOSPHORUS     Status: None   Collection Time    06/30/14  5:15 AM      Result Value Ref Range   Phosphorus 3.4  2.3 - 4.6 mg/dL  GLUCOSE, CAPILLARY     Status: Abnormal   Collection Time    06/30/14  8:29 AM      Result Value Ref Range   Glucose-Capillary 158 (*) 70 - 99 mg/dL    Imaging / Studies: No results found.  Medications / Allergies:  Scheduled Meds: . antiseptic oral rinse  7 mL Mouth Rinse QID  . chlorhexidine  15 mL Mouth Rinse BID  . ciprofloxacin  400 mg Intravenous Q12H  . feeding supplement (OSMOLITE 1.2 CAL)  1,000 mL Per Tube Q24H  . feeding supplement (PRO-STAT SUGAR FREE 64)  30 mL Per Tube TID  . hydrocortisone sod succinate (SOLU-CORTEF) inj  50 mg Intravenous Q6H  . insulin aspart  0-9 Units Subcutaneous 6 times per day  . metronidazole  500 mg Intravenous Q8H  . pantoprazole (PROTONIX) IV  40 mg Intravenous Q24H  . potassium chloride  10 mEq Intravenous Q1 Hr x 2  . vancomycin  1,000 mg Intravenous Q24H   Continuous Infusions: . sodium chloride 10 mL/hr at 06/29/14 1409  . Marland KitchenTPN (CLINIMIX-E) Adult 40 mL/hr at 06/30/14 [phone]  And  . fat emulsion 250 mL (06/29/14 2015)   PRN Meds:.albuterol, fentaNYL, LORazepam, morphine injection  Antibiotics: Anti-infectives   Start     Dose/Rate Route Frequency Ordered Stop   06/25/14 1300  metroNIDAZOLE (FLAGYL) IVPB 500 mg  Status:  Discontinued     Comments:  On call to or tomorrow   500 mg 100 mL/hr over 60 Minutes Intravenous  Once 06/25/14 1248 06/29/14 0740   06/18/14 0600  vancomycin (VANCOCIN) IVPB 1000 mg/200 mL premix     1,000 mg 200 mL/hr over 60 Minutes Intravenous Every 24 hours 06/17/14 2234     06/17/14 2100  ciprofloxacin (CIPRO) IVPB 400 mg     400 mg 200 mL/hr over 60 Minutes Intravenous Every 12 hours 06/17/14 1816     06/17/14 1700  metroNIDAZOLE (FLAGYL) IVPB 500 mg     500 mg 100 mL/hr over 60 Minutes Intravenous Every 8 hours 06/17/14 1658        Assessment/Plan Danelly Buikema  73 y.o. female  4 Days Post-Op  Procedure(s): EXPLORATORY LAPAROTOMY TRANSVERSE COLECTOMY/COLOSTOMY NEW GASTROSTOMY TUBE -continue with TF -BID wet to dry dressing changes -stable for DC to LTACH at [name] from surgical standpoint -will arrange outpatient follow up with Dr. Donne Hazel in 2-3 weeks    Erby Pian, [facility] Surgery Pager [phone](7A-4:30P) For consults and floor pages call [phone](7A-4:30P)  06/30/2014 10:20 AM

## 2014-06-30 NOTE — Progress Notes (Signed)
PARENTERAL NUTRITION CONSULT NOTE   Pharmacy Consult for TPN Indication: Perforated colon  Allergies  Allergen Reactions  . Namenda [Memantine Hcl] Other (See Comments)    "hallucinations"   . Codeine Other (See Comments)    Unknown. [facility]  . Penicillins Other (See Comments)    Unknown; listed [facility]    Patient Measurements: Height: 5' 1.02" (155 cm) Weight: 146 lb 13.2 oz (66.6 kg) IBW/kg (Calculated) : 47.86   Vital Signs: Temp: 98 F (36.7 C) (08/18 0400) Temp src: Axillary (08/18 0400) BP: 112/51 mmHg (08/18 0400) Pulse Rate: 89 (08/18 0454) Intake/Output from previous day: 08/17 0701 - 08/18 0700 In: 3226.1 [I.V.:230; NG/GT:202.5; IV Piggyback:500; TPN:2293.6] Out: 1300 [Urine:1300] Intake/Output from this shift:    Labs:  Recent Labs  06/28/14 0402 06/29/14 0513 06/30/14 0515  WBC 18.3* 12.9* 10.6*  HGB 8.7* 8.4* 8.7*  HCT 28.1* 27.0* 28.4*  PLT 287 309 306     Recent Labs  06/28/14 0402 06/29/14 0513 06/29/14 1311 06/30/14 0515  NA 136* 142  --  143  K 4.8 3.6*  --  3.8  CL 95* 99  --  100  CO2 31 37*  --  35*  GLUCOSE 127* 111*  --  143*  BUN 34* 32*  --  39*  CREATININE 0.51 0.43*  --  0.45*  CALCIUM 8.8 8.9  --  8.7  MG 2.1 2.0  --  2.0  PHOS 3.2 3.5  --  3.4  PROT  --  6.1  --   --   ALBUMIN  --  2.6*  --   --   AST  --  16  --   --   ALT  --  13  --   --   ALKPHOS  --  82  --   --   BILITOT  --  0.3  --   --   PREALBUMIN  --   --  18.3  --   TRIG  --   --  25  --    Estimated Creatinine Clearance: 56.4 ml/min (by C-G formula based on Cr of 0.45).    Recent Labs  06/29/14 2045 06/30/14 0021 06/30/14 0424  GLUCAP 191* 111* 154*    Insulin Requirements in the past 24 hours:  6 units SSI + 13 units regular insulin in TPN bag  Current Nutrition:  Clinimix E 5/15 at 7680mL/hr + lipid 20% emulsion at 465mL/hr- provides 96g protein and 1603 kcal daily- meeting 100% of needs  Nutritional  Goals:   1400-1600 kCal, 90-10 grams of protein per day (goal change per RD note 8/17)  Assessment: 73 y/o F from [name] with chronic trach presents with draining PEG tube found to be trans-colonic.  GI: Pt on TF at [name] with prealbumin 19.2 on 8/5. Current prealbumin 20.9. Started TPN in anticipation of prolonged inability to receive TF post-op. POD#4 colectomy with colostomy and new gastrostomy tube- gastrotomy site and open wound. Last BM 8/17. TFs started via G-tube on 8/17 - current at 30 ml/hr - 75% of goal rate - no residuals noted, will go ahead and reduce TPN to half rate this morning will plans to stop after today's bag has completed infusing.   Endo: DM.  She is  on chronic steroids- currently on stress dose IV HC- 50mg  IV q8h. CBGs/24h 117-191- well controlled with SSI + insulin in TPN bag  Lytes: K 3.8 << 3.6 (s/p 4 runs KCl on  8/17), Mag 2, Phos 3.4, CorCa 9.6,  will plan to replace potassium again this morning  Renal: Scr 0.45 << 0.42, CrCl hard to estimate given poor muscle mass. 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  Cards: Hx HTN. BP soft-wnl, HR normal-tachy- currently on no CV meds.  Hepatobil: LFT's WNL, alb 2.6. Trigs 25 on 8/17  Neuro: Anxiety, dementia, lumbar compression fx.   ID: Vanco/Flagyl/Cipro (all since 8/4) for intraabdominal process. Afebrile. WBC 10.6 << 12.9 . .  Best Practices: IV PPI, SCDs  TPN Access: 8/6 double-lumen PICC line  TPN day#: 8 (8/10 >> current)  Plan:  1. Reduce Clinimix E 5/15 at 54ml/hr + lipid 20% emulsion at 44ml/hr this morning as the TF rate continues to increase - will plan to discontinue after the completion of today's bag 2. Continue CBGs and SSI as ordered 3. KCl 10 mEq IV x 2 runs this morning 4. Will d/c TPN labs  5. Will continue to monitor TF progression 6. F/u start of Lovenox as per surgery's plans  Georgina Pillion, PharmD, BCPS Clinical Pharmacist Pager:  [phone] 06/30/2014 7:59 AM

## 2014-06-30 NOTE — Progress Notes (Signed)
Katelyn Rojas. Corliss Skains, MD, [facility] Surgery  General/ Trauma Surgery  06/30/2014 10:38 AM

## 2014-07-01 ENCOUNTER — Inpatient Hospital Stay (HOSPITAL_COMMUNITY)
Admission: EM | Admit: 2014-07-01 | Discharge: 2014-07-23 | DRG: 393 | Disposition: A | Discharge status: Other Acute Inpatient Hospital | Payer: Medicare Other | Attending: Internal Medicine | Admitting: Internal Medicine

## 2014-07-01 ENCOUNTER — Emergency Department (HOSPITAL_COMMUNITY): Payer: Medicare Other

## 2014-07-01 ENCOUNTER — Encounter (HOSPITAL_COMMUNITY): Payer: Self-pay | Admitting: Emergency Medicine

## 2014-07-01 DIAGNOSIS — K631 Perforation of intestine (nontraumatic): Secondary | ICD-10-CM

## 2014-07-01 DIAGNOSIS — R633 Feeding difficulties, unspecified: Secondary | ICD-10-CM | POA: Diagnosis present

## 2014-07-01 DIAGNOSIS — R0600 Dyspnea, unspecified: Secondary | ICD-10-CM

## 2014-07-01 DIAGNOSIS — E873 Alkalosis: Secondary | ICD-10-CM | POA: Diagnosis not present

## 2014-07-01 DIAGNOSIS — F411 Generalized anxiety disorder: Secondary | ICD-10-CM | POA: Diagnosis not present

## 2014-07-01 DIAGNOSIS — T8131XA Disruption of external operation (surgical) wound, not elsewhere classified, initial encounter: Secondary | ICD-10-CM | POA: Diagnosis not present

## 2014-07-01 DIAGNOSIS — E876 Hypokalemia: Secondary | ICD-10-CM | POA: Diagnosis present

## 2014-07-01 DIAGNOSIS — Z9981 Dependence on supplemental oxygen: Secondary | ICD-10-CM | POA: Diagnosis not present

## 2014-07-01 DIAGNOSIS — L8993 Pressure ulcer of unspecified site, stage 3: Secondary | ICD-10-CM | POA: Diagnosis not present

## 2014-07-01 DIAGNOSIS — IMO0001 Reserved for inherently not codable concepts without codable children: Secondary | ICD-10-CM

## 2014-07-01 DIAGNOSIS — L89109 Pressure ulcer of unspecified part of back, unspecified stage: Secondary | ICD-10-CM | POA: Diagnosis not present

## 2014-07-01 DIAGNOSIS — J449 Chronic obstructive pulmonary disease, unspecified: Secondary | ICD-10-CM | POA: Diagnosis not present

## 2014-07-01 DIAGNOSIS — Z8614 Personal history of Methicillin resistant Staphylococcus aureus infection: Secondary | ICD-10-CM | POA: Diagnosis not present

## 2014-07-01 DIAGNOSIS — K560 Paralytic ileus: Secondary | ICD-10-CM | POA: Diagnosis not present

## 2014-07-01 DIAGNOSIS — I420 Dilated cardiomyopathy: Secondary | ICD-10-CM

## 2014-07-01 DIAGNOSIS — Z794 Long term (current) use of insulin: Secondary | ICD-10-CM | POA: Diagnosis not present

## 2014-07-01 DIAGNOSIS — Z87891 Personal history of nicotine dependence: Secondary | ICD-10-CM

## 2014-07-01 DIAGNOSIS — E1169 Type 2 diabetes mellitus with other specified complication: Secondary | ICD-10-CM | POA: Diagnosis not present

## 2014-07-01 DIAGNOSIS — G8929 Other chronic pain: Secondary | ICD-10-CM

## 2014-07-01 DIAGNOSIS — I9589 Other hypotension: Secondary | ICD-10-CM | POA: Diagnosis present

## 2014-07-01 DIAGNOSIS — Z9911 Dependence on respirator [ventilator] status: Secondary | ICD-10-CM | POA: Diagnosis not present

## 2014-07-01 DIAGNOSIS — Z8674 Personal history of sudden cardiac arrest: Secondary | ICD-10-CM | POA: Diagnosis not present

## 2014-07-01 DIAGNOSIS — K56609 Unspecified intestinal obstruction, unspecified as to partial versus complete obstruction: Secondary | ICD-10-CM

## 2014-07-01 DIAGNOSIS — E118 Type 2 diabetes mellitus with unspecified complications: Secondary | ICD-10-CM

## 2014-07-01 DIAGNOSIS — Z933 Colostomy status: Secondary | ICD-10-CM

## 2014-07-01 DIAGNOSIS — J189 Pneumonia, unspecified organism: Secondary | ICD-10-CM

## 2014-07-01 DIAGNOSIS — D638 Anemia in other chronic diseases classified elsewhere: Secondary | ICD-10-CM | POA: Diagnosis not present

## 2014-07-01 DIAGNOSIS — K9423 Gastrostomy malfunction: Secondary | ICD-10-CM | POA: Diagnosis not present

## 2014-07-01 DIAGNOSIS — E1165 Type 2 diabetes mellitus with hyperglycemia: Secondary | ICD-10-CM

## 2014-07-01 DIAGNOSIS — I10 Essential (primary) hypertension: Secondary | ICD-10-CM | POA: Diagnosis not present

## 2014-07-01 DIAGNOSIS — Z79899 Other long term (current) drug therapy: Secondary | ICD-10-CM

## 2014-07-01 DIAGNOSIS — L89153 Pressure ulcer of sacral region, stage 3: Secondary | ICD-10-CM | POA: Diagnosis present

## 2014-07-01 DIAGNOSIS — Z515 Encounter for palliative care: Secondary | ICD-10-CM

## 2014-07-01 DIAGNOSIS — E43 Unspecified severe protein-calorie malnutrition: Secondary | ICD-10-CM | POA: Diagnosis present

## 2014-07-01 DIAGNOSIS — Z66 Do not resuscitate: Secondary | ICD-10-CM | POA: Diagnosis not present

## 2014-07-01 DIAGNOSIS — G894 Chronic pain syndrome: Secondary | ICD-10-CM

## 2014-07-01 DIAGNOSIS — R1310 Dysphagia, unspecified: Secondary | ICD-10-CM

## 2014-07-01 DIAGNOSIS — J438 Other emphysema: Secondary | ICD-10-CM

## 2014-07-01 DIAGNOSIS — Z90710 Acquired absence of both cervix and uterus: Secondary | ICD-10-CM | POA: Diagnosis not present

## 2014-07-01 DIAGNOSIS — Y833 Surgical operation with formation of external stoma as the cause of abnormal reaction of the patient, or of later complication, without mention of misadventure at the time of the procedure: Secondary | ICD-10-CM | POA: Diagnosis not present

## 2014-07-01 DIAGNOSIS — Z7189 Other specified counseling: Secondary | ICD-10-CM

## 2014-07-01 DIAGNOSIS — Z930 Tracheostomy status: Secondary | ICD-10-CM

## 2014-07-01 DIAGNOSIS — F0390 Unspecified dementia without behavioral disturbance: Secondary | ICD-10-CM | POA: Diagnosis present

## 2014-07-01 DIAGNOSIS — R609 Edema, unspecified: Secondary | ICD-10-CM | POA: Diagnosis not present

## 2014-07-01 DIAGNOSIS — J9612 Chronic respiratory failure with hypercapnia: Secondary | ICD-10-CM

## 2014-07-01 DIAGNOSIS — Z880 Allergy status to penicillin: Secondary | ICD-10-CM | POA: Diagnosis not present

## 2014-07-01 DIAGNOSIS — R1084 Generalized abdominal pain: Secondary | ICD-10-CM

## 2014-07-01 DIAGNOSIS — J9610 Chronic respiratory failure, unspecified whether with hypoxia or hypercapnia: Secondary | ICD-10-CM | POA: Diagnosis present

## 2014-07-01 DIAGNOSIS — R636 Underweight: Secondary | ICD-10-CM

## 2014-07-01 DIAGNOSIS — J4489 Other specified chronic obstructive pulmonary disease: Secondary | ICD-10-CM | POA: Diagnosis present

## 2014-07-01 DIAGNOSIS — IMO0002 Reserved for concepts with insufficient information to code with codable children: Secondary | ICD-10-CM

## 2014-07-01 DIAGNOSIS — J411 Mucopurulent chronic bronchitis: Secondary | ICD-10-CM

## 2014-07-01 DIAGNOSIS — Z789 Other specified health status: Secondary | ICD-10-CM | POA: Diagnosis present

## 2014-07-01 DIAGNOSIS — R627 Adult failure to thrive: Secondary | ICD-10-CM | POA: Diagnosis present

## 2014-07-01 DIAGNOSIS — E119 Type 2 diabetes mellitus without complications: Secondary | ICD-10-CM

## 2014-07-01 DIAGNOSIS — S31109A Unspecified open wound of abdominal wall, unspecified quadrant without penetration into peritoneal cavity, initial encounter: Secondary | ICD-10-CM

## 2014-07-01 DIAGNOSIS — J9601 Acute respiratory failure with hypoxia: Secondary | ICD-10-CM

## 2014-07-01 DIAGNOSIS — R1013 Epigastric pain: Secondary | ICD-10-CM

## 2014-07-01 DIAGNOSIS — R600 Localized edema: Secondary | ICD-10-CM | POA: Diagnosis present

## 2014-07-01 DIAGNOSIS — R531 Weakness: Secondary | ICD-10-CM

## 2014-07-01 DIAGNOSIS — R933 Abnormal findings on diagnostic imaging of other parts of digestive tract: Secondary | ICD-10-CM

## 2014-07-01 DIAGNOSIS — J9611 Chronic respiratory failure with hypoxia: Secondary | ICD-10-CM

## 2014-07-01 DIAGNOSIS — R109 Unspecified abdominal pain: Secondary | ICD-10-CM

## 2014-07-01 LAB — URINALYSIS, ROUTINE W REFLEX MICROSCOPIC
Bilirubin Urine: NEGATIVE
Glucose, UA: NEGATIVE mg/dL
Ketones, ur: NEGATIVE mg/dL
NITRITE: NEGATIVE
PROTEIN: NEGATIVE mg/dL
Specific Gravity, Urine: 1.02 (ref 1.005–1.030)
Urobilinogen, UA: 0.2 mg/dL (ref 0.0–1.0)
pH: 5.5 (ref 5.0–8.0)

## 2014-07-01 LAB — COMPREHENSIVE METABOLIC PANEL
ALT: 22 U/L (ref 0–35)
ANION GAP: 9 (ref 5–15)
AST: 21 U/L (ref 0–37)
Albumin: 2.3 g/dL — ABNORMAL LOW (ref 3.5–5.2)
Alkaline Phosphatase: 89 U/L (ref 39–117)
BUN: 27 mg/dL — ABNORMAL HIGH (ref 6–23)
CALCIUM: 8.6 mg/dL (ref 8.4–10.5)
CO2: 36 mEq/L — ABNORMAL HIGH (ref 19–32)
Chloride: 100 mEq/L (ref 96–112)
Creatinine, Ser: 0.39 mg/dL — ABNORMAL LOW (ref 0.50–1.10)
GFR calc Af Amer: >90 mL/min (ref >90)
Glucose, Bld: 178 mg/dL — ABNORMAL HIGH (ref 70–99)
Potassium: 3.8 mEq/L (ref 3.7–5.3)
SODIUM: 145 meq/L (ref 137–147)
TOTAL PROTEIN: 5.4 g/dL — AB (ref 6.0–8.3)
Total Bilirubin: 0.2 mg/dL — ABNORMAL LOW (ref 0.3–1.2)

## 2014-07-01 LAB — CBC WITH DIFFERENTIAL/PLATELET
Basophils Absolute: 0 10*3/uL (ref 0.0–0.1)
Basophils Relative: 0 % (ref 0–1)
EOS ABS: 0 10*3/uL (ref 0.0–0.7)
EOS PCT: 0 % (ref 0–5)
HEMATOCRIT: 30.1 % — AB (ref 36.0–46.0)
Hemoglobin: 9.2 g/dL — ABNORMAL LOW (ref 12.0–15.0)
LYMPHS ABS: 0.6 10*3/uL — AB (ref 0.7–4.0)
Lymphocytes Relative: 5 % — ABNORMAL LOW (ref 12–46)
MCH: 31.2 pg (ref 26.0–34.0)
MCHC: 30.6 g/dL (ref 30.0–36.0)
MCV: 102 fL — AB (ref 78.0–100.0)
MONO ABS: 0.5 10*3/uL (ref 0.1–1.0)
Monocytes Relative: 4 % (ref 3–12)
Neutro Abs: 12.3 10*3/uL — ABNORMAL HIGH (ref 1.7–7.7)
Neutrophils Relative %: 91 % — ABNORMAL HIGH (ref 43–77)
PLATELETS: 318 10*3/uL (ref 150–400)
RBC: 2.95 MIL/uL — ABNORMAL LOW (ref 3.87–5.11)
RDW: 14.9 % (ref 11.5–15.5)
WBC: 13.5 10*3/uL — AB (ref 4.0–10.5)

## 2014-07-01 LAB — URINE MICROSCOPIC-ADD ON

## 2014-07-01 LAB — I-STAT TROPONIN, ED: Troponin i, poc: 0.01 ng/mL (ref 0.00–0.08)

## 2014-07-01 LAB — I-STAT CG4 LACTIC ACID, ED: LACTIC ACID, VENOUS: 1.54 mmol/L (ref 0.5–2.2)

## 2014-07-01 LAB — LIPASE, BLOOD: Lipase: 14 U/L (ref 11–59)

## 2014-07-01 MED ORDER — IOHEXOL 300 MG/ML  SOLN: 25.0000 mL | INTRAMUSCULAR | Status: AC

## 2014-07-01 MED ORDER — SODIUM CHLORIDE 0.9 % IV SOLN
1000.0000 mL | INTRAVENOUS | Status: DC
  Administered 2014-07-01: 1000 mL via INTRAVENOUS

## 2014-07-01 MED ORDER — IOHEXOL 300 MG/ML  SOLN
80.0000 mL | Freq: Once | INTRAMUSCULAR | Status: AC | PRN
  Administered 2014-07-01: 100 mL via INTRAVENOUS

## 2014-07-01 MED ORDER — SODIUM CHLORIDE 0.9 % IV SOLN
1000.0000 mL | Freq: Once | INTRAVENOUS | Status: AC
  Administered 2014-07-01: 1000 mL via INTRAVENOUS

## 2014-07-01 MED ORDER — ONDANSETRON HCL 4 MG/2ML IJ SOLN
4.0000 mg | Freq: Once | INTRAMUSCULAR | Status: AC
Start: 2014-07-01 — End: 2014-07-01
  Administered 2014-07-01: 4 mg via INTRAVENOUS
  Filled 2014-07-01: qty 2

## 2014-07-01 MED ORDER — HYDROMORPHONE HCL PF 1 MG/ML IJ SOLN
0.5000 mg | INTRAMUSCULAR | Status: DC | PRN
  Administered 2014-07-01: 0.5 mg via INTRAVENOUS
  Filled 2014-07-01: qty 1

## 2014-07-01 NOTE — Consult Note (Signed)
  This patient comes from [name].  Had G-tube place and Friday along with a colostomy.  G-tube inadvertently pulled out this AM at the [name] facility.  Patient brought to the ED at [facility] several hours later.  Attempts to replace the G-tube at [name] were unsuccessful.  I completely removed the tube here and using a long cotton tip stick, passe the tube back into a position that may be extra gastric, but CT is peinding, and the tube appears to irrigate well although it does not aspirate well.  I would be surprised if after 8-10 hours I was able to get the tube back into place.  If it is not in the proper position the patient may need surgery for irrigation and replacement of the tube.  I reviewed the CT scan with contrast through the NGT and there is no evidence of gastric leakage.  The replaced G-tube is not in the proper position and I have removed it.  The midline wound is not healing well at all.  She is on a course for additional procedures and more complications.  I have briefly spoken with the family about this.  Marta Lamas. Gae Bon, MD, FACS [phone] [facility] Surgery

## 2014-07-01 NOTE — ED Provider Notes (Signed)
CSN: [id number]     Arrival date & time 07/01/14  1823 History   First MD Initiated Contact with Patient 07/01/14 1826     Chief Complaint  Patient presents with  . GI Problem   L5 caveat: Patient is unable to speak or communicate HPI The patient is a chronically ill patient with a tracheostomy maintained on a ventilator. The patient has history of a prior PEA arrest. Patient is a resident of [facility].  Patient was recently in the hospital after it was discovered that she had a PEG tube placed that had transverse colon. The patient had an exploratory laparotomy performed on August 14. At that time she had the tube removed. The portion of the colon that was perforated was removed and she had a partial colectomy. The patient also had a gastrotomy with removal of the portion of the stomach that was involved. Patient was discharged back to [facility] facility in the last couple of days. Starting yesterday they noticed that the PEG tube became dislodged. They replaced the PEG tube at the facility. Since that time they've noticed serosanguineous fluid draining as well as bubbling from the site. Patient was transferred to the emergency room for further evaluation. Past Medical History  Diagnosis Date  . Hypertension   . 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 Arthritis   . Anxiety   . Cachexia 12/2013  . DM type 2 (diabetes mellitus, type 2)   . 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 Anemia 12/2013  . Dementia   . Compression fracture of lumbar vertebra, non-traumatic 12/2013    L3 and L1.    Past Surgical History  Procedure Laterality Date  . Vaginal hysterectomy      ? Abdominal vs. Vaginal  . Laparotomy N/A 06/26/2014    Procedure: EXPLORATORY LAPAROTOMY;  Surgeon: Emelia LoronMatthew Wakefield, MD;  [facility];  Service: General;  Laterality: N/A;  . Partial colectomy N/A 06/26/2014    Procedure: TRANSVERSE COLECTOMY/COLOSTOMY;  Surgeon: Emelia LoronMatthew  Wakefield, MD;  [facility];  Service: General;  Laterality: N/A;  . Gastrostomy N/A 06/26/2014    Procedure: NEW GASTROSTOMY TUBE;  Surgeon: Emelia LoronMatthew Wakefield, MD;  [facility];  Service: General;  Laterality: N/A;   Family History  Problem Relation Age of Onset  . Bleeding Disorder Mother     had cardiomegaly, and died to cerebral hemorrhage around age 71  . Heart attack Neg Hx     No family history of heart problem   History  Substance Use Topics  . Smoking status: Former Games developermoker  . Smokeless tobacco: Not on file  . Alcohol Use: Not on file   OB History   Grav Para Term Preterm Abortions TAB SAB Ect Mult Living                 Review of Systems  All other systems reviewed and are negative.     Allergies  Namenda; Codeine; and Penicillins  Home Medications   Prior to Admission medications   Medication Sig Start Date End Date Taking? Authorizing Provider  albuterol (PROVENTIL HFA;VENTOLIN HFA) 108 (90 BASE) MCG/ACT inhaler Inhale 1-2 puffs into the lungs every 6 (six) hours as needed for wheezing or shortness of breath.   Yes Historical Provider, MD  Amino Acids-Protein Hydrolys (FEEDING SUPPLEMENT, PRO-STAT SUGAR FREE 64,) LIQD Place 30 mLs into feeding tube 3 (three) times daily. 06/30/14  Yes Simonne MartinetPeter E Babcock, NP  chlorhexidine (PERIDEX) 0.12 % solution Use as directed 15 mLs in the mouth or throat 2 (two) times daily.   Yes Historical Provider, MD  ciprofloxacin (CIPRO) 400 MG/40ML SOLN injection Inject 400 mg into the vein 2 (two) times daily. For infection 06/16/14  Yes Historical Provider, MD  dextrose 10 % infusion Inject 50 mL/hr into the vein continuous.   Yes Historical Provider, MD  hydrocortisone sodium succinate (SOLU-CORTEF) 100 MG SOLR injection Inject 1 mL (50 mg total) into the vein every 6 (six) hours. 06/30/14  Yes Simonne Martinet, NP  insulin aspart (NOVOLOG) 100 UNIT/ML injection Inject 0-9 Units into the skin every 4 (four) hours. 06/30/14  Yes Simonne Martinet, NP  LORazepam (ATIVAN) 2 MG/ML injection Inject 0.5-1 mLs (1-2 mg total) into the vein every 3 (three) hours as needed for anxiety or sedation. 06/30/14  Yes Simonne Martinet, NP  metroNIDAZOLE (FLAGYL) 50 mg/ml oral suspension Inject 500 mg into the vein every 8 (eight) hours. For infection 06/17/14  Yes Historical Provider, MD  morphine 2 MG/ML injection Inject 2 mg into the vein every 4 (four) hours as needed.   Yes Historical Provider, MD  Nutritional Supplements (FEEDING SUPPLEMENT, OSMOLITE 1.2 CAL,) LIQD Place 1,000 mLs into feeding tube daily. 06/30/14  Yes Simonne Martinet, NP  pantoprazole (PROTONIX) 40 MG injection Inject 40 mg into the vein daily. 06/30/14  Yes Simonne Martinet, NP  vancomycin (VANCOCIN) 1 GM/200ML SOLN Inject 200 mLs (1,000 mg total) into the vein daily. 06/30/14  Yes Simonne Martinet, NP  acetaminophen (TYLENOL) 325 MG tablet 650 mg by PEG Tube route every 6 (six) hours as needed (pain).    Historical Provider, MD  citalopram (CELEXA) 20 MG tablet 20 mg by PEG Tube route daily.    Historical Provider, MD  clonazePAM (KLONOPIN) 0.25 MG disintegrating tablet 0.25 mg by PEG Tube route 3 (three) times daily.    Historical Provider, MD  donepezil (ARICEPT) 10 MG tablet 10 mg by PEG Tube route at bedtime. For dementia    Historical Provider, MD  zinc sulfate 220 MG capsule Give 220 mg by tube daily.     Historical Provider, MD   BP 95/50  Pulse 85  Temp(Src) 99.3 F (37.4 C) (Rectal)  Resp 13  SpO2 100% Physical Exam  Nursing note and vitals reviewed. Constitutional:  Chronically ill appearing, frail  HENT:  Head: Normocephalic and atraumatic.  Right Ear: External ear normal.  Left Ear: External ear normal.  Eyes: Conjunctivae are normal. Right eye exhibits no discharge. Left eye exhibits no discharge. No scleral icterus.  Neck: Neck supple. No tracheal deviation present.  Cardiovascular: Normal rate, regular rhythm and intact distal pulses.   Pulmonary/Chest:  Effort normal and breath sounds normal. No stridor. No respiratory distress. She has no wheezes. She has no rales.  Abdominal: She exhibits distension. There is tenderness. There is no rigidity, no rebound and no guarding. No hernia.  Colostomy noted, there is bubbling fluid draining from the gastrostomy tube site  Genitourinary:  Foley catheter is in place draining cloudy urine  Musculoskeletal: She exhibits edema (bilateral lower extremities). She exhibits no tenderness.  Neurological: She is alert. She has normal strength. No cranial nerve deficit (no facial droop, extraocular movements intact, no slurred speech) or sensory deficit. She exhibits normal muscle tone. She displays no seizure activity. Coordination normal.  Skin: Skin is warm and dry. No rash noted.  Psychiatric: She has a normal mood and affect.  ED Course  Procedures (including critical care time) Labs Review Labs Reviewed  CBC WITH DIFFERENTIAL - Abnormal; Notable for the following:    WBC 13.5 (*)    RBC 2.95 (*)    Hemoglobin 9.2 (*)    HCT 30.1 (*)    MCV 102.0 (*)    Neutrophils Relative % 91 (*)    Neutro Abs 12.3 (*)    Lymphocytes Relative 5 (*)    Lymphs Abs 0.6 (*)    All other components within normal limits  COMPREHENSIVE METABOLIC PANEL - Abnormal; Notable for the following:    CO2 36 (*)    Glucose, Bld 178 (*)    BUN 27 (*)    Creatinine, Ser 0.39 (*)    Total Protein 5.4 (*)    Albumin 2.3 (*)    Total Bilirubin 0.2 (*)    All other components within normal limits  URINALYSIS, ROUTINE W REFLEX MICROSCOPIC - Abnormal; Notable for the following:    Color, Urine AMBER (*)    APPearance TURBID (*)    Hgb urine dipstick SMALL (*)    Leukocytes, UA LARGE (*)    All other components within normal limits  URINE MICROSCOPIC-ADD ON - Abnormal; Notable for the following:    Bacteria, UA FEW (*)    Crystals CA OXALATE CRYSTALS (*)    All other components within normal limits  URINE CULTURE   LIPASE, BLOOD  I-STAT CG4 LACTIC ACID, ED  Rosezena Sensor, ED    Imaging Review Dg Chest Portable 1 View  07/01/2014   CLINICAL DATA:  GI problem.  COPD.  Diabetes.  Dementia.  EXAM: PORTABLE CHEST - 1 VIEW  COMPARISON:  06/23/2014  FINDINGS: Moderate patient rotation to the right. A right-sided PICC line terminates at the low SVC. Tracheostomy is grossly normal in position.  Cardiomegaly. Suspect a small right pleural effusion. No pneumothorax. No gross congestive failure. Improved bibasilar aeration. There may be mild left base atelectasis remaining.  Left upper quadrant gas could be within small bowel loops or the stomach.  IMPRESSION: Moderately position degraded exam. Possible trace right pleural fluid and mild left base atelectasis. No gross acute disease. Overall improved aeration since 06/23/2014.   Electronically Signed   By: Jeronimo Greaves M.D.   On: 07/01/2014 19:25   Dg Abd Portable 1v  07/01/2014   CLINICAL DATA:  Abdominal pain.  EXAM: PORTABLE ABDOMEN - 1 VIEW  COMPARISON:  06/17/2014  FINDINGS: There is some scattered residual contrast in the colon. Air-filled small bowel loops are noted. Could not exclude a small bowel obstruction. The NG tube tip is in the antral region of the stomach.  IMPRESSION: NG tube is in the stomach.  Dilated air-filled small bowel loops, suggesting small bowel obstruction.   Electronically Signed   By: Loralie Champagne M.D.   On: 07/01/2014 21:04     MDM   Final diagnoses:  Small bowel obstruction  Ventilator dependent    [name] plain films suggest a bowel obstruction.  Her CT scan is still pending.  I have consulted with Dr. Lindie Spruce, general surgery.  He is in the emergency room evaluating the patient. He requests admission to the critical care service Center and her multiple medical problems and chronic ventilator dependence.    Linwood Dibbles, MD 07/01/14 [phone]

## 2014-07-01 NOTE — H&P (Signed)
PULMONARY / CRITICAL CARE MEDICINE   Name: Katelyn Rojas MRN: [id number] DOB: September 14, 1941    ADMISSION DATE:  07/01/2014 CONSULTATION DATE:  07/01/2014  REFERRING MD :  EDP  CHIEF COMPLAINT:  PEG tube malfunction  INITIAL PRESENTATION: 73 year old female from [name]. Chronic vent/trach. D/c from [facility] 8/18 to [name], PEG noted to be dislodged 8/19. It was replaced at [name] but continued to have drainage and bubbling around tube. Brought to ED for evaluation. PCCM called for admission.  STUDIES:  8/19 - XR abdomen: NG tube in stomach, air filled bowel loops. Concern for obstruction.  8/19 CT abdomen/pelvis: >>>  SIGNIFICANT EVENTS: 8/5 - 8/18 > admission to [facility] 8/14 - To OR for segmental transverse colectomy, wedge gastrectomy, open Stamm gastrostomy, and End colostomy 8/19 - PEG dislodged, attempted to replace at [facility]. drainage at site, to ED.  HISTORY OF PRESENT ILLNESS:  73 y.o. F, [name] resident on chronic trach. Recent admission to [facility] with complication fo PEG. Surgeon at [name] was concerned that PEG may be transversing colon into stomach. Pt was brought to ED for evaluation of "perforated viscus"; however, upon arrival to [facility] ED, this was determined to not be the case. Eventually after multiple discussions with family she underwent segmental transverse colectomy, wedge gastrectomy, open Stamm gastrostomy, and End colostomy on 8/14. She was started on TF and was discharged to [facility] 8/18. Later that day PEG became dislodged, and was replaced at [name]. Since that time there has been drainage around the tube. She was brought to ED for eval. PCCM called for admission.   PAST MEDICAL HISTORY :  Past Medical History  Diagnosis Date  . Hypertension   . 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 Arthritis   . Anxiety   . Cachexia 12/2013  . DM type 2 (diabetes mellitus, type 2)   . 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 Anemia 12/2013  . Dementia    . Compression fracture of lumbar vertebra, non-traumatic 12/2013    L3 and L1.    Past Surgical History  Procedure Laterality Date  . Vaginal hysterectomy      ? Abdominal vs. Vaginal  . Laparotomy N/A 06/26/2014    Procedure: EXPLORATORY LAPAROTOMY;  Surgeon: Emelia Loron, MD;  Location: [facility] OR;  Service: General;  Laterality: N/A;  . Partial colectomy N/A 06/26/2014    Procedure: TRANSVERSE COLECTOMY/COLOSTOMY;  Surgeon: Emelia Loron, MD;  Location: [facility] OR;  Service: General;  Laterality: N/A;  . Gastrostomy N/A 06/26/2014    Procedure: NEW GASTROSTOMY TUBE;  Surgeon: Emelia Loron, MD;  Location: [facility] OR;  Service: General;  Laterality: N/A;   Prior to Admission medications   Medication Sig Start Date End Date Taking? Authorizing Provider  albuterol (PROVENTIL HFA;VENTOLIN HFA) 108 (90 BASE) MCG/ACT inhaler Inhale 1-2 puffs into the lungs every 6 (six) hours as needed for wheezing or shortness of breath.   Yes Historical Provider, MD  Amino Acids-Protein Hydrolys (FEEDING SUPPLEMENT, PRO-STAT SUGAR FREE 64,) LIQD Place 30 mLs into feeding tube 3 (three) times daily. 06/30/14  Yes Simonne Martinet, NP  chlorhexidine (PERIDEX) 0.12 % solution Use as directed 15 mLs in the mouth or throat 2 (two) times daily.   Yes Historical Provider, MD  ciprofloxacin (CIPRO) 400 MG/40ML SOLN injection Inject 400 mg into the vein 2 (two) times daily. For infection 06/16/14  Yes Historical Provider, MD  dextrose 10 % infusion Inject 50 mL/hr into the vein continuous.  Yes Historical Provider, MD  hydrocortisone sodium succinate (SOLU-CORTEF) 100 MG SOLR injection Inject 1 mL (50 mg total) into the vein every 6 (six) hours. 06/30/14  Yes Simonne Martinet, NP  insulin aspart (NOVOLOG) 100 UNIT/ML injection Inject 0-9 Units into the skin every 4 (four) hours. 06/30/14  Yes Simonne Martinet, NP  LORazepam (ATIVAN) 2 MG/ML injection Inject 0.5-1 mLs (1-2 mg total) into the vein every 3 (three) hours as needed for  anxiety or sedation. 06/30/14  Yes Simonne Martinet, NP  metroNIDAZOLE (FLAGYL) 50 mg/ml oral suspension Inject 500 mg into the vein every 8 (eight) hours. For infection 06/17/14  Yes Historical Provider, MD  morphine 2 MG/ML injection Inject 2 mg into the vein every 4 (four) hours as needed.   Yes Historical Provider, MD  Nutritional Supplements (FEEDING SUPPLEMENT, OSMOLITE 1.2 CAL,) LIQD Place 1,000 mLs into feeding tube daily. 06/30/14  Yes Simonne Martinet, NP  pantoprazole (PROTONIX) 40 MG injection Inject 40 mg into the vein daily. 06/30/14  Yes Simonne Martinet, NP  vancomycin (VANCOCIN) 1 GM/200ML SOLN Inject 200 mLs (1,000 mg total) into the vein daily. 06/30/14  Yes Simonne Martinet, NP  acetaminophen (TYLENOL) 325 MG tablet 650 mg by PEG Tube route every 6 (six) hours as needed (pain).    Historical Provider, MD  citalopram (CELEXA) 20 MG tablet 20 mg by PEG Tube route daily.    Historical Provider, MD  clonazePAM (KLONOPIN) 0.25 MG disintegrating tablet 0.25 mg by PEG Tube route 3 (three) times daily.    Historical Provider, MD  donepezil (ARICEPT) 10 MG tablet 10 mg by PEG Tube route at bedtime. For dementia    Historical Provider, MD  zinc sulfate 220 MG capsule Give 220 mg by tube daily.     Historical Provider, MD   Allergies  Allergen Reactions  . Namenda [Memantine Hcl] Other (See Comments)    "hallucinations"   . Codeine Other (See Comments)    Unknown. Listed on [facility]  . Penicillins Other (See Comments)    Unknown; listed on [facility] MAR    FAMILY HISTORY:  Family History  Problem Relation Age of Onset  . Bleeding Disorder Mother     had cardiomegaly, and died to cerebral hemorrhage around age 53  . Heart attack Neg Hx     No family history of heart problem   SOCIAL HISTORY:  reports that she has quit smoking. She does not have any smokeless tobacco history on file. Her alcohol and drug histories are not on file.  REVIEW OF SYSTEMS:   Unable,  chronic vent, trach  SUBJECTIVE:   VITAL SIGNS: Temp:  [98.5 F (36.9 C)-99.3 F (37.4 C)] 99.3 F (37.4 C) (08/19 2115) Pulse Rate:  [84-112] 85 (08/19 2215) Resp:  [13-17] 13 (08/19 2215) BP: (92-100)/(50-57) 95/50 mmHg (08/19 2215) SpO2:  [100 %] 100 % (08/19 2215) FiO2 (%):  [30 %-40 %] 35 % (08/19 2115) HEMODYNAMICS:   VENTILATOR SETTINGS: Vent Mode:  [-] PRVC FiO2 (%):  [30 %-40 %] 35 % Set Rate:  [14 bmp] 14 bmp Vt Set:  [400 mL-460 mL] 460 mL PEEP:  [5 cmH20] 5 cmH20 Plateau Pressure:  [27 cmH20] 27 cmH20 INTAKE / OUTPUT:  Intake/Output Summary (Last 24 hours) at 07/01/14 2326 Last data filed at 07/01/14 2105  Gross per 24 hour  Intake   1000 ml  Output      0 ml  Net   1000 ml  PHYSICAL EXAMINATION: General:  Elderly female, on vent, in NAD Neuro:  Non-verbal, awake, alert.  HEENT:  [place]/AT, PERRL. Trach in place Cardiovascular:  RRR, no MRG Lungs:  Clear anterior, synchronous with vent, breathing over set rate Abdomen:  Soft, non-distended. PEG  Musculoskeletal:  No acute deformity.  Skin:  Intact, +1 pitting peripheral edema.   LABS:  CBC  Recent Labs Lab 06/29/14 0513 06/30/14 0515 07/01/14 1843  WBC 12.9* 10.6* 13.5*  HGB 8.4* 8.7* 9.2*  HCT 27.0* 28.4* 30.1*  PLT 309 306 318   Coag's  Recent Labs Lab 06/26/14 0500  INR 1.21   BMET  Recent Labs Lab 06/29/14 0513 06/30/14 0515 07/01/14 1843  NA 142 143 145  K 3.6* 3.8 3.8  CL 99 100 100  CO2 37* 35* 36*  BUN 32* 39* 27*  CREATININE 0.43* 0.45* 0.39*  GLUCOSE 111* 143* 178*   Electrolytes  Recent Labs Lab 06/28/14 0402 06/29/14 0513 06/30/14 0515 07/01/14 1843  CALCIUM 8.8 8.9 8.7 8.6  MG 2.1 2.0 2.0  --   PHOS 3.2 3.5 3.4  --    Sepsis Markers  Recent Labs Lab 07/01/14 1952  LATICACIDVEN 1.54   ABG No results found for this basename: PHART, PCO2ART, PO2ART,  in the last 168 hours Liver Enzymes  Recent Labs Lab 06/25/14 0600 06/29/14 0513  07/01/14 1843  AST 16 16 21   ALT 11 13 22   ALKPHOS 105 82 89  BILITOT <0.2* 0.3 0.2*  ALBUMIN 2.7* 2.6* 2.3*   Cardiac Enzymes No results found for this basename: TROPONINI, PROBNP,  in the last 168 hours Glucose  Recent Labs Lab 06/29/14 2045 06/30/14 0021 06/30/14 0424 06/30/14 0829 06/30/14 1244 06/30/14 1659  GLUCAP 191* 111* 154* 158* 112* 191*    Imaging No results found.   ASSESSMENT / PLAN:  PULMONARY Trach 04/01/2014 A: VDRF COPD Trach Status  P:   Full vent support.  Wean as able.  Albuterol PRN. ABG  CARDIOVASCULAR PICC 8/6 >>> A:  Chronic Hypotension - SBP baseline 80's  P:  Monitor  RENAL A:   No acute issues  P:   Follow BMP  GASTROINTESTINAL A:   PEG malfunctioning ?SBO  P:   NPO SUP ppx, IV PPI Surgery to see  HEMATOLOGIC A:   Anemia - Chronic  P:  Follow CBC VTE ppx - Heparin  INFECTIOUS A:   Hx MRSA and enterococcus bacteremia Hx Tracheitis (cultures + for Alcaligenes bacteria)  Sacral decubitus ulcer  P:   UC 8/19 >>> Continue pre-admission cipro, flagyl, vancomycin. Change to IV Low threshold empiric antifungals if spike / clinical declines  ENDOCRINE A:   DM Relative AI   P:   SSI Stress dose steriods  NEUROLOGIC A:   Dementia Anxiety  P:   RASS goal: 0 PRN fentanyl for RASS goal and pain.   Global: Discussion with family. They are unhappy that she is back in the hospital. They feel that she is progressing when she is at [name], and staff there is hopeful that she can wean. She is to remain a full code at this time.   Joneen Roach, ACNP [place] [facility]/Critical [facility] Pager [phone] or [phone]   I have personally obtained a history, examined the patient, evaluated laboratory and imaging results, formulated the assessment and plan and placed orders. CRITICAL CARE: The patient is critically ill with multiple organ systems failure and requires high complexity decision making  for assessment and support, frequent evaluation and titration of  therapies, application of advanced monitoring technologies and extensive interpretation of multiple databases. Critical Care Time devoted to patient care services described in this note is ___minutes.

## 2014-07-01 NOTE — ED Notes (Signed)
Per [facility]: Pt from [name] with PEG tube problems that started yesterday.  PEG tube dislodged at facility;  Facility replaced tube and since tube has been draining serosanguineous fluid; bubbling noted at site of insertion.  Pt is trached due to respiratory failure from COPD.

## 2014-07-02 DIAGNOSIS — F0390 Unspecified dementia without behavioral disturbance: Secondary | ICD-10-CM

## 2014-07-02 DIAGNOSIS — R627 Adult failure to thrive: Secondary | ICD-10-CM | POA: Diagnosis present

## 2014-07-02 DIAGNOSIS — Z9981 Dependence on supplemental oxygen: Secondary | ICD-10-CM | POA: Diagnosis not present

## 2014-07-02 DIAGNOSIS — R633 Feeding difficulties, unspecified: Secondary | ICD-10-CM | POA: Diagnosis present

## 2014-07-02 DIAGNOSIS — E873 Alkalosis: Secondary | ICD-10-CM | POA: Diagnosis not present

## 2014-07-02 DIAGNOSIS — E43 Unspecified severe protein-calorie malnutrition: Secondary | ICD-10-CM

## 2014-07-02 DIAGNOSIS — Z880 Allergy status to penicillin: Secondary | ICD-10-CM | POA: Diagnosis not present

## 2014-07-02 DIAGNOSIS — Z794 Long term (current) use of insulin: Secondary | ICD-10-CM | POA: Diagnosis not present

## 2014-07-02 DIAGNOSIS — I10 Essential (primary) hypertension: Secondary | ICD-10-CM | POA: Diagnosis present

## 2014-07-02 DIAGNOSIS — D638 Anemia in other chronic diseases classified elsewhere: Secondary | ICD-10-CM | POA: Diagnosis present

## 2014-07-02 DIAGNOSIS — K560 Paralytic ileus: Secondary | ICD-10-CM | POA: Diagnosis not present

## 2014-07-02 DIAGNOSIS — K9423 Gastrostomy malfunction: Secondary | ICD-10-CM | POA: Diagnosis present

## 2014-07-02 DIAGNOSIS — R109 Unspecified abdominal pain: Secondary | ICD-10-CM

## 2014-07-02 DIAGNOSIS — F411 Generalized anxiety disorder: Secondary | ICD-10-CM | POA: Diagnosis present

## 2014-07-02 DIAGNOSIS — L89109 Pressure ulcer of unspecified part of back, unspecified stage: Secondary | ICD-10-CM | POA: Diagnosis present

## 2014-07-02 DIAGNOSIS — Z8674 Personal history of sudden cardiac arrest: Secondary | ICD-10-CM | POA: Diagnosis not present

## 2014-07-02 DIAGNOSIS — Z8614 Personal history of Methicillin resistant Staphylococcus aureus infection: Secondary | ICD-10-CM | POA: Diagnosis not present

## 2014-07-02 DIAGNOSIS — I9589 Other hypotension: Secondary | ICD-10-CM | POA: Diagnosis present

## 2014-07-02 DIAGNOSIS — Z9911 Dependence on respirator [ventilator] status: Secondary | ICD-10-CM | POA: Diagnosis not present

## 2014-07-02 DIAGNOSIS — L8993 Pressure ulcer of unspecified site, stage 3: Secondary | ICD-10-CM

## 2014-07-02 DIAGNOSIS — R0902 Hypoxemia: Secondary | ICD-10-CM

## 2014-07-02 DIAGNOSIS — Z930 Tracheostomy status: Secondary | ICD-10-CM | POA: Diagnosis not present

## 2014-07-02 DIAGNOSIS — Z66 Do not resuscitate: Secondary | ICD-10-CM | POA: Diagnosis present

## 2014-07-02 DIAGNOSIS — E876 Hypokalemia: Secondary | ICD-10-CM | POA: Diagnosis present

## 2014-07-02 DIAGNOSIS — J449 Chronic obstructive pulmonary disease, unspecified: Secondary | ICD-10-CM | POA: Diagnosis present

## 2014-07-02 DIAGNOSIS — Z87891 Personal history of nicotine dependence: Secondary | ICD-10-CM | POA: Diagnosis not present

## 2014-07-02 DIAGNOSIS — Z933 Colostomy status: Secondary | ICD-10-CM | POA: Diagnosis not present

## 2014-07-02 DIAGNOSIS — Z90710 Acquired absence of both cervix and uterus: Secondary | ICD-10-CM | POA: Diagnosis not present

## 2014-07-02 DIAGNOSIS — R933 Abnormal findings on diagnostic imaging of other parts of digestive tract: Secondary | ICD-10-CM

## 2014-07-02 DIAGNOSIS — Y833 Surgical operation with formation of external stoma as the cause of abnormal reaction of the patient, or of later complication, without mention of misadventure at the time of the procedure: Secondary | ICD-10-CM | POA: Diagnosis present

## 2014-07-02 DIAGNOSIS — E1169 Type 2 diabetes mellitus with other specified complication: Secondary | ICD-10-CM | POA: Diagnosis not present

## 2014-07-02 DIAGNOSIS — IMO0002 Reserved for concepts with insufficient information to code with codable children: Secondary | ICD-10-CM | POA: Diagnosis not present

## 2014-07-02 DIAGNOSIS — T8131XA Disruption of external operation (surgical) wound, not elsewhere classified, initial encounter: Secondary | ICD-10-CM | POA: Diagnosis not present

## 2014-07-02 DIAGNOSIS — R609 Edema, unspecified: Secondary | ICD-10-CM | POA: Diagnosis present

## 2014-07-02 DIAGNOSIS — J9610 Chronic respiratory failure, unspecified whether with hypoxia or hypercapnia: Secondary | ICD-10-CM | POA: Diagnosis present

## 2014-07-02 DIAGNOSIS — Z79899 Other long term (current) drug therapy: Secondary | ICD-10-CM | POA: Diagnosis not present

## 2014-07-02 LAB — BASIC METABOLIC PANEL
ANION GAP: 7 (ref 5–15)
BUN: 23 mg/dL (ref 6–23)
CO2: 33 mEq/L — ABNORMAL HIGH (ref 19–32)
CREATININE: 0.41 mg/dL — AB (ref 0.50–1.10)
Calcium: 8.4 mg/dL (ref 8.4–10.5)
Chloride: 101 mEq/L (ref 96–112)
Glucose, Bld: 87 mg/dL (ref 70–99)
Potassium: 3.1 mEq/L — ABNORMAL LOW (ref 3.7–5.3)
Sodium: 141 mEq/L (ref 137–147)

## 2014-07-02 LAB — I-STAT ARTERIAL BLOOD GAS, ED
Acid-Base Excess: 12 mmol/L — ABNORMAL HIGH (ref 0.0–2.0)
Bicarbonate: 36.4 mEq/L — ABNORMAL HIGH (ref 20.0–24.0)
O2 Saturation: 96 %
TCO2: 38 mmol/L (ref 0–100)
pCO2 arterial: 49 mmHg — ABNORMAL HIGH (ref 35.0–45.0)
pH, Arterial: 7.479 — ABNORMAL HIGH (ref 7.350–7.450)
pO2, Arterial: 76 mmHg — ABNORMAL LOW (ref 80.0–100.0)

## 2014-07-02 LAB — CBC
HCT: 25.3 % — ABNORMAL LOW (ref 36.0–46.0)
Hemoglobin: 7.7 g/dL — ABNORMAL LOW (ref 12.0–15.0)
MCH: 30 pg (ref 26.0–34.0)
MCHC: 30.4 g/dL (ref 30.0–36.0)
MCV: 98.4 fL (ref 78.0–100.0)
Platelets: 282 10*3/uL (ref 150–400)
RBC: 2.57 MIL/uL — ABNORMAL LOW (ref 3.87–5.11)
RDW: 14.8 % (ref 11.5–15.5)
WBC: 9.9 10*3/uL (ref 4.0–10.5)

## 2014-07-02 LAB — GLUCOSE, CAPILLARY
GLUCOSE-CAPILLARY: 111 mg/dL — AB (ref 70–99)
Glucose-Capillary: 102 mg/dL — ABNORMAL HIGH (ref 70–99)
Glucose-Capillary: 108 mg/dL — ABNORMAL HIGH (ref 70–99)
Glucose-Capillary: 153 mg/dL — ABNORMAL HIGH (ref 70–99)
Glucose-Capillary: 163 mg/dL — ABNORMAL HIGH (ref 70–99)
Glucose-Capillary: 99 mg/dL (ref 70–99)

## 2014-07-02 MED ORDER — VANCOMYCIN HCL IN DEXTROSE 1-5 GM/200ML-% IV SOLN
1000.0000 mg | INTRAVENOUS | Status: DC
  Administered 2014-07-02 – 2014-07-17 (×16): 1000 mg via INTRAVENOUS
  Filled 2014-07-02 (×18): qty 200

## 2014-07-02 MED ORDER — SODIUM CHLORIDE 0.9 % IV SOLN: INTRAVENOUS | Status: DC

## 2014-07-02 MED ORDER — HEPARIN SODIUM (PORCINE) 5000 UNIT/ML IJ SOLN: 5000.0000 [IU] | Freq: Three times a day (TID) | INTRAMUSCULAR | Status: DC

## 2014-07-02 MED ORDER — ACETAMINOPHEN 650 MG RE SUPP: 650.0000 mg | Freq: Four times a day (QID) | RECTAL | Status: DC | PRN

## 2014-07-02 MED ORDER — HEPARIN SODIUM (PORCINE) 5000 UNIT/ML IJ SOLN
5000.0000 [IU] | Freq: Three times a day (TID) | INTRAMUSCULAR | Status: DC
  Administered 2014-07-02 – 2014-07-20 (×55): 5000 [IU] via SUBCUTANEOUS
  Filled 2014-07-02 (×59): qty 1

## 2014-07-02 MED ORDER — PANTOPRAZOLE SODIUM 40 MG IV SOLR
40.0000 mg | Freq: Every day | INTRAVENOUS | Status: DC
  Administered 2014-07-02 – 2014-07-18 (×17): 40 mg via INTRAVENOUS
  Filled 2014-07-02 (×19): qty 40

## 2014-07-02 MED ORDER — FENTANYL CITRATE 0.05 MG/ML IJ SOLN
25.0000 ug | INTRAMUSCULAR | Status: DC | PRN
Start: 2014-07-02 — End: 2014-07-07
  Administered 2014-07-02: 25 ug via INTRAVENOUS
  Administered 2014-07-02 (×3): 100 ug via INTRAVENOUS
  Administered 2014-07-03 (×4): 25 ug via INTRAVENOUS
  Administered 2014-07-04: 50 ug via INTRAVENOUS
  Administered 2014-07-04 (×4): 100 ug via INTRAVENOUS
  Administered 2014-07-04: 50 ug via INTRAVENOUS
  Administered 2014-07-05 – 2014-07-07 (×14): 100 ug via INTRAVENOUS
  Filled 2014-07-02 (×28): qty 2

## 2014-07-02 MED ORDER — INSULIN ASPART 100 UNIT/ML ~~LOC~~ SOLN
0.0000 [IU] | SUBCUTANEOUS | Status: DC
  Administered 2014-07-02 (×2): 2 [IU] via SUBCUTANEOUS
  Administered 2014-07-03 (×3): 1 [IU] via SUBCUTANEOUS
  Administered 2014-07-03 – 2014-07-04 (×2): 2 [IU] via SUBCUTANEOUS
  Administered 2014-07-05: 5 [IU] via SUBCUTANEOUS
  Administered 2014-07-05: 3 [IU] via SUBCUTANEOUS
  Administered 2014-07-05 – 2014-07-06 (×7): 2 [IU] via SUBCUTANEOUS
  Administered 2014-07-06: 06:00:00 via SUBCUTANEOUS
  Administered 2014-07-06: 1 [IU] via SUBCUTANEOUS
  Administered 2014-07-06 – 2014-07-07 (×3): 2 [IU] via SUBCUTANEOUS
  Administered 2014-07-07: 3 [IU] via SUBCUTANEOUS
  Administered 2014-07-07: 2 [IU] via SUBCUTANEOUS
  Administered 2014-07-07: 3 [IU] via SUBCUTANEOUS
  Administered 2014-07-07 – 2014-07-08 (×6): 2 [IU] via SUBCUTANEOUS
  Administered 2014-07-09 (×2): 3 [IU] via SUBCUTANEOUS
  Administered 2014-07-09: 5 [IU] via SUBCUTANEOUS

## 2014-07-02 MED ORDER — ALBUTEROL SULFATE (2.5 MG/3ML) 0.083% IN NEBU
2.5000 mg | INHALATION_SOLUTION | RESPIRATORY_TRACT | Status: DC | PRN
  Administered 2014-07-13: 2.5 mg via RESPIRATORY_TRACT
  Filled 2014-07-02: qty 3

## 2014-07-02 MED ORDER — PANTOPRAZOLE SODIUM 40 MG IV SOLR: 40.0000 mg | Freq: Every day | INTRAVENOUS | Status: DC

## 2014-07-02 MED ORDER — SODIUM CHLORIDE 0.9 % IV SOLN: 250.0000 mL | INTRAVENOUS | Status: DC | PRN

## 2014-07-02 MED ORDER — INSULIN ASPART 100 UNIT/ML ~~LOC~~ SOLN: 2.0000 [IU] | SUBCUTANEOUS | Status: DC

## 2014-07-02 MED ORDER — CIPROFLOXACIN IN D5W 400 MG/200ML IV SOLN
400.0000 mg | Freq: Two times a day (BID) | INTRAVENOUS | Status: DC
  Administered 2014-07-02 – 2014-07-17 (×32): 400 mg via INTRAVENOUS
  Filled 2014-07-02 (×34): qty 200

## 2014-07-02 MED ORDER — SODIUM CHLORIDE 0.9 % IV SOLN
INTRAVENOUS | Status: DC
  Administered 2014-07-02 – 2014-07-08 (×2): via INTRAVENOUS
  Administered 2014-07-15: 1000 mL via INTRAVENOUS
  Administered 2014-07-17 – 2014-07-20 (×2): via INTRAVENOUS

## 2014-07-02 MED ORDER — METRONIDAZOLE IN NACL 5-0.79 MG/ML-% IV SOLN
500.0000 mg | Freq: Three times a day (TID) | INTRAVENOUS | Status: DC
  Administered 2014-07-02 – 2014-07-17 (×47): 500 mg via INTRAVENOUS
  Filled 2014-07-02 (×49): qty 100

## 2014-07-02 MED ORDER — KCL IN DEXTROSE-NACL 20-5-0.45 MEQ/L-%-% IV SOLN
INTRAVENOUS | Status: AC
  Administered 2014-07-02: 07:00:00 via INTRAVENOUS
  Administered 2014-07-03: 75 mL/h via INTRAVENOUS
  Filled 2014-07-02 (×4): qty 1000

## 2014-07-02 MED ORDER — HYDROCORTISONE NA SUCCINATE PF 100 MG IJ SOLR
50.0000 mg | Freq: Four times a day (QID) | INTRAMUSCULAR | Status: DC
  Administered 2014-07-02 – 2014-07-09 (×30): 50 mg via INTRAVENOUS
  Filled 2014-07-02 (×33): qty 1

## 2014-07-02 NOTE — Progress Notes (Signed)
PULMONARY / CRITICAL CARE MEDICINE   Name: Katelyn Rojas MRN: [id number] DOB: 11/22/1940    ADMISSION DATE:  07/01/2014  REFERRING MD :  EDP  CHIEF COMPLAINT:  PEG tube malfunction  INITIAL PRESENTATION: 73 year old female from [name]. Chronic vent/trach. [facility] to [name], PEG noted to be dislodged 8/19. It was replaced at [name] but continued to have drainage and bubbling around tube. Brought to ED for evaluation. PCCM called for admission.  STUDIES:  8/19 - XR abdomen: NG tube in stomach, air filled bowel loops. Concern for obstruction.  8/19 CT abdomen/pelvis: >>>  SIGNIFICANT EVENTS: 8/5 - 8/18 > [name] - To OR for segmental transverse colectomy, wedge gastrectomy, open Stamm gastrostomy, and End colostomy 8/19 - PEG dislodged, attempted to replace at [facility]. drainage at site, to ED.  SUBJECTIVE:  Resting comfortable.  On full vent support.  VITAL SIGNS: Temp:  [98.5 F (36.9 C)-99.3 F (37.4 C)] 99.3 F (37.4 C) (08/19 2115) Pulse Rate:  [84-112] 98 (08/20 0334) Resp:  [13-17] 14 (08/20 0334) BP: (92-116)/(49-57) 107/53 mmHg (08/20 0200) SpO2:  [100 %] 100 % (08/20 0334) FiO2 (%):  [30 %-40 %] 35 % (08/20 0334) Weight:  [132 lb 11.5 oz (60.2 kg)-146 lb 13.2 oz (66.6 kg)] 132 lb 11.5 oz (60.2 kg) (08/20 0115) VENTILATOR SETTINGS: Vent Mode:  [-] PRVC FiO2 (%):  [30 %-40 %] 35 % Set Rate:  [14 bmp] 14 bmp Vt Set:  [400 mL-460 mL] 460 mL PEEP:  [5 cmH20] 5 cmH20 Plateau Pressure:  [10 cmH20-27 cmH20] 10 cmH20 INTAKE / OUTPUT:  Intake/Output Summary (Last 24 hours) at 07/02/14 [id number] Last data filed at 07/01/14 2105  Gross per 24 hour  Intake   1000 ml  Output      0 ml  Net   1000 ml    PHYSICAL EXAMINATION: General:  Elderly female, on vent, in NAD Neuro:  Non-verbal, awake, alert.  HEENT:  [place]/AT, PERRL. Trach in place Cardiovascular:  RRR, no MRG Lungs:  Clear anterior, synchronous with vent, breathing over set rate Abdomen:   Soft, non-distended. PEG  Musculoskeletal:  No acute deformity.  Skin:  Intact, +1 pitting peripheral edema.   LABS:  CBC  Recent Labs Lab 06/30/14 0515 07/01/14 1843 07/02/14 0346  WBC 10.6* 13.5* 9.9  HGB 8.7* 9.2* 7.7*  HCT 28.4* 30.1* 25.3*  PLT 306 318 282   Coag's  Recent Labs Lab 06/26/14 0500  INR 1.21   BMET  Recent Labs Lab 06/30/14 0515 07/01/14 1843 07/02/14 0346  NA 143 145 141  K 3.8 3.8 3.1*  CL 100 100 101  CO2 35* 36* 33*  BUN 39* 27* 23  CREATININE 0.45* 0.39* 0.41*  GLUCOSE 143* 178* 87   Electrolytes  Recent Labs Lab 06/28/14 0402 06/29/14 0513 06/30/14 0515 07/01/14 1843 07/02/14 0346  CALCIUM 8.8 8.9 8.7 8.6 8.4  MG 2.1 2.0 2.0  --   --   PHOS 3.2 3.5 3.4  --   --    Sepsis Markers  Recent Labs Lab 07/01/14 1952  LATICACIDVEN 1.54   ABG  Recent Labs Lab 07/02/14 0052  PHART 7.479*  PCO2ART 49.0*  PO2ART 76.0*   Liver Enzymes  Recent Labs Lab 06/29/14 0513 07/01/14 1843  AST 16 21  ALT 13 22  ALKPHOS 82 89  BILITOT 0.3 0.2*  ALBUMIN 2.6* 2.3*   Glucose  Recent Labs Lab 06/30/14 0424 06/30/14 0829 06/30/14 1244 06/30/14 1659 07/02/14 [id number]  07/02/14 0500  GLUCAP 154* 158* 112* 191* 102* 108*    Imaging Dg Chest Portable 1 View  07/01/2014   CLINICAL DATA:  GI problem.  COPD.  Diabetes.  Dementia.  EXAM: PORTABLE CHEST - 1 VIEW  COMPARISON:  06/23/2014  FINDINGS: Moderate patient rotation to the right. A right-sided PICC line terminates at the low SVC. Tracheostomy is grossly normal in position.  Cardiomegaly. Suspect a small right pleural effusion. No pneumothorax. No gross congestive failure. Improved bibasilar aeration. There may be mild left base atelectasis remaining.  Left upper quadrant gas could be within small bowel loops or the stomach.  IMPRESSION: Moderately position degraded exam. Possible trace right pleural fluid and mild left base atelectasis. No gross acute disease. Overall improved  aeration since 06/23/2014.   Electronically Signed   By: Jeronimo Greaves M.D.   On: 07/01/2014 19:25   Dg Abd Portable 1v  07/01/2014   CLINICAL DATA:  Abdominal pain.  EXAM: PORTABLE ABDOMEN - 1 VIEW  COMPARISON:  06/17/2014  FINDINGS: There is some scattered residual contrast in the colon. Air-filled small bowel loops are noted. Could not exclude a small bowel obstruction. The NG tube tip is in the antral region of the stomach.  IMPRESSION: NG tube is in the stomach.  Dilated air-filled small bowel loops, suggesting small bowel obstruction.   Electronically Signed   By: Loralie Champagne M.D.   On: 07/01/2014 21:04     ASSESSMENT / PLAN:  PULMONARY Trach 04/01/2014  A: Chronic respiratory failure 2nd to severe COPD.  Tracheostomy status with respirator dependence. P:   Pressure support wean as tolerated F/u CXR, ABG as needed  PRN albuterol  CARDIOVASCULAR Rt PICC 8/6 >>>  A:  Chronic Hypotension - SBP baseline 80's P:  Monitor hemodynamics  RENAL A:   Hypokalemia. P:   F/u and replace electrolytes as needed Monitor renal fx, urine outpt  GASTROINTESTINAL A:   PEG malfunctioning ?SBO Severe protein calorie malnutrition. P:   PEG, wound care per surgery NPO ?if she might need TNA protonix for SUP  HEMATOLOGIC A:   Anemia of chronic disease. P:  Follow CBC VTE ppx - Heparin  INFECTIOUS A:   Hx MRSA and enterococcus bacteremia. Hx Tracheitis (cultures + for Alcaligenes bacteria).  Sacral decubitus ulcer. P:   UC 8/19 >>> Continue pre-admission cipro, flagyl, vancomycin. Change to IV  ENDOCRINE A:   DM type II. Relative AI. P:   SSI Stress dose steriods  NEUROLOGIC A:   Dementia. Anxiety. P:   RASS goal: 0 PRN fentanyl for RASS goal and pain.   Goals of Care >> Family remains optimistic that she will improve/recover and feel she was making good progress with care at [name].  They would like her to return to [name] once her current acute  medical/surgical issues are addressed.    Coralyn Helling, MD [facility] Pulmonary/Critical Care 07/02/2014, 6:11 AM Pager:  [phone] After 3pm call: [phone]

## 2014-07-02 NOTE — Progress Notes (Signed)
[facility] Progress Note     Subjective: Pt asleep, minimally arousable, yawning.  Does not follow commands.  Moves to painful stimuli.  No family at bedside.  Objective: Vital signs in last 24 hours: Temp:  [98.5 F (36.9 C)-99.3 F (37.4 C)] 99.3 F (37.4 C) (08/20 0400) Pulse Rate:  [84-114] 112 (08/20 0600) Resp:  [13-17] 17 (08/20 0600) BP: (92-116)/(49-61) 109/55 mmHg (08/20 0600) SpO2:  [99 %-100 %] 99 % (08/20 0600) FiO2 (%):  [30 %-40 %] 35 % (08/20 0334) Weight:  [132 lb 11.5 oz (60.2 kg)-146 lb 13.2 oz (66.6 kg)] 132 lb 11.5 oz (60.2 kg) (08/20 0115) Last BM Date: 06/29/14  Intake/Output from previous day: 08/19 0701 - 08/20 0700 In: 1076.3 [I.V.:1076.3] Out: 250 [Urine:250] Intake/Output this shift:    PE: Gen:  NAD, asleep, yawning, alert to pain perception only Card:  RRR, no M/G/R heard Pulm:  Vent dependent, CTA, no W/R/R Abd: Soft, noticeably tender to palpation over G-tube site and midline wound, ND, +BS, no HSM, midline incision intact with minor separation of fascia and slough, no visible granulation tissue, g-tube site with minimal serous drainage, indurated around old G tube site Ext:  Edematous, contracted  Lab Results:   Recent Labs  07/01/14 1843 07/02/14 0346  WBC 13.5* 9.9  HGB 9.2* 7.7*  HCT 30.1* 25.3*  PLT 318 282   BMET  Recent Labs  07/01/14 1843 07/02/14 0346  NA 145 141  K 3.8 3.1*  CL 100 101  CO2 36* 33*  GLUCOSE 178* 87  BUN 27* 23  CREATININE 0.39* 0.41*  CALCIUM 8.6 8.4   PT/INR No results found for this basename: LABPROT, INR,  in the last 72 hours CMP     Component Value Date/Time   NA 141 07/02/2014 0346   K 3.1* 07/02/2014 0346   CL 101 07/02/2014 0346   CO2 33* 07/02/2014 0346   GLUCOSE 87 07/02/2014 0346   BUN 23 07/02/2014 0346   CREATININE 0.41* 07/02/2014 0346   CALCIUM 8.4 07/02/2014 0346   PROT 5.4* 07/01/2014 1843   ALBUMIN 2.3* 07/01/2014 1843   AST 21 07/01/2014 1843   ALT 22 07/01/2014  1843   ALKPHOS 89 07/01/2014 1843   BILITOT 0.2* 07/01/2014 1843   GFRNONAA >90 07/02/2014 0346   GFRAA >90 07/02/2014 0346   Lipase     Component Value Date/Time   LIPASE 14 07/01/2014 1843       Studies/Results: Ct Abdomen Pelvis W Contrast  07/02/2014   CLINICAL DATA:  Possible infection. " "GI problem"  EXAM: CT ABDOMEN AND PELVIS WITH CONTRAST  TECHNIQUE: Multidetector CT imaging of the abdomen and pelvis was performed using the standard protocol following bolus administration of intravenous contrast.  CONTRAST:  OMNIPAQUE IOHEXOL 300 MG/ML  SOLN  COMPARISON:  05/08/2014  FINDINGS: BODY WALL: There is anasarca.  P  LOWER CHEST: Small layering pleural effusions and bibasilar atelectasis. Centrilobular emphysema.  ABDOMEN/PELVIS:  Liver: Essentially negative.  Biliary: No evidence of biliary obstruction or stone.  Pancreas: Unchanged mild enlargement of the main duct. No visible mass, inflammation, or calculus.  Spleen: Unremarkable.  Adrenals: Unremarkable.  Kidneys and ureters: 4.3 cm right renal cyst, herniating into the pelvis. No hydronephrosis.  Bladder: Decompressed around a Foley catheter.  Reproductive: There may have been hysterectomy.  No adnexal mass.  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 Contrast  was injected through the nasogastric tube, which continued through small bowel and colon into the right abdomen colostomy. The colostomy is edematous status post recent surgery. There are loops of small bowel in the left abdomen which are dilated and fluid-filled, but do not contain positive oral contrast. These are presumably in continuity with the contrast containing segments given the surgical history. Extraluminal more dense contrast is seen in the left abdomen and possibly in the pelvis. Reportedly there was injection of barium for tube placement confirmation at outside  hospital. The recently placed percutaneous gastrostomy tube is extra luminal, tip in the left abdomen. There is no extravasation of the more low-density positive oral contrast to suggest active leak. Pneumoperitoneum is present in the upper abdomen. Some retained barium present within the rectum.  OSSEOUS: No acute abnormalities.  IMPRESSION: 1. Extra luminal percutaneous gastrostomy tube after replacement. Pneumoperitoneum and extraluminal barium likely related to prior attempts to replace the catheter. No extravasation of contrast administered via the nasogastric tube. 2. Dilated small bowel loops with fluid levels and angulation consistent with partial obstruction. Contrast via nasogastric tube does reach the right abdominal transverse colostomy.   Electronically Signed   By: Tiburcio Pea M.D.   On: 07/02/2014 01:09   Dg Chest Portable 1 View  07/01/2014   CLINICAL DATA:  GI problem.  COPD.  Diabetes.  Dementia.  EXAM: PORTABLE CHEST - 1 VIEW  COMPARISON:  06/23/2014  FINDINGS: Moderate patient rotation to the right. A right-sided PICC line terminates at the low SVC. Tracheostomy is grossly normal in position.  Cardiomegaly. Suspect a small right pleural effusion. No pneumothorax. No gross congestive failure. Improved bibasilar aeration. There may be mild left base atelectasis remaining.  Left upper quadrant gas could be within small bowel loops or the stomach.  IMPRESSION: Moderately position degraded exam. Possible trace right pleural fluid and mild left base atelectasis. No gross acute disease. Overall improved aeration since 06/23/2014.   Electronically Signed   By: Jeronimo Greaves M.D.   On: 07/01/2014 19:25   Dg Abd Portable 1v  07/01/2014   CLINICAL DATA:  Abdominal pain.  EXAM: PORTABLE ABDOMEN - 1 VIEW  COMPARISON:  06/17/2014  FINDINGS: There is some scattered residual contrast in the colon. Air-filled small bowel loops are noted. Could not exclude a small bowel obstruction. The NG tube tip is in  the antral region of the stomach.  IMPRESSION: NG tube is in the stomach.  Dilated air-filled small bowel loops, suggesting small bowel obstruction.   Electronically Signed   By: Loralie Champagne M.D.   On: 07/01/2014 21:04    Anti-infectives: Anti-infectives   Start     Dose/Rate Route Frequency Ordered Stop   07/02/14 0800  metroNIDAZOLE (FLAGYL) IVPB 500 mg     500 mg 100 mL/hr over 60 Minutes Intravenous Every 8 hours 07/02/14 0054     07/02/14 0600  vancomycin (VANCOCIN) IVPB 1000 mg/200 mL premix     1,000 mg 200 mL/hr over 60 Minutes Intravenous Every 24 hours 07/02/14 0054     07/02/14 0200  ciprofloxacin (CIPRO) IVPB 400 mg     400 mg 200 mL/hr over 60 Minutes Intravenous Every 12 hours 07/02/14 0054         Assessment/Plan Transcolonic percutaneous gastrostomy tube POD #6 s/p segmental transverse colectomy, wedge gastrectomy, open Stamm gastrostomy, End colostomy Now with Dislodged G-tube  Plan: 1.  Continue wound care, ? add abdominal binder, no PO by mouth, NG tube currently in place but  clamped off 2.  Will ask Dr. Nicholes MangoMann/Hung if they would be willing to assist with re-placement of G-tube with endoscopic guidance, or we could ask IR to replace.  Would have to coordinate with GI if agreeable - tomorrow? 3.  Surgery is not a good option at this point.  The wound is not healing well, there is no granulation tissue, if her wound opens up could require emergent ex lap 4.  Suggest TPN for now 5.  SCD's and heparin    LOS: 1 day    Aris GeorgiaDORT, Krishiv Sandler 07/02/2014, 7:48 AM Pager: [phone]

## 2014-07-02 NOTE — Progress Notes (Signed)
Follow up trach consult. Plan is for pt to be discharged back to [name]. No education needed at this time. Will follow as needed.

## 2014-07-02 NOTE — Progress Notes (Signed)
Trach Team Note Patient Details Name: Katelyn Rojas MRN: [id number] DOB: [birth year] Today's Date: 07/02/2014 Time:  -    SLP documenting as part of trach team.  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  Uncertain if she used vent PMSV at [name].  Will defer PMSV at this time.  If hospital stay is longer than expected and pt. Attempting/needing to communicate, please reconsult if appropriate.   Breck Coons [place].Ed [employer] [phone]  07/02/2014

## 2014-07-02 NOTE — Plan of Care (Signed)
Problem: Consults Goal: Nutrition Consult-if indicated Outcome: Not Progressing Waiting for PEG placement  Problem: Phase II Progression Outcomes Goal: IV changed to normal saline lock Outcome: Not Progressing d5 1/2 NS with 20K running@ 27ml/hr  Problem: Phase III Progression Outcomes Goal: Voiding independently Outcome: Not Progressing FCSD

## 2014-07-02 NOTE — Progress Notes (Signed)
The patient's surgically placed PEG dislodged.  I will attempt endoscopic placement of a PEG tube.

## 2014-07-02 NOTE — Trach Care Team (Signed)
Trach Care Progression Note   Patient Details Name: Katelyn Rojas MRN: [id number] DOB: 1941-04-12 Today's Date: 07/02/2014   Tracheostomy Assessment    Tracheostomy Shiley 6 mm Cuffed (Active)  Status Secured 07/02/2014 11:08 AM  Site Assessment Clean;Dry 07/02/2014 11:08 AM  Site Care Protective barrier to skin 07/02/2014  1:15 AM  Inner Cannula Care Changed/new 06/30/2014  5:00 AM  Ties Assessment Clean;Dry;Secure 07/02/2014 11:08 AM  Cuff pressure (cm) 28 cm 06/30/2014  3:18 PM  Emergency Equipment at bedside Yes 07/02/2014 11:08 AM     Care Needs     Respiratory Therapy O2 Device: Ventilator FiO2 (%): 35 % SpO2: 97 %    Speech Language Pathology  SLP chart review complete: Patient does not need SLP services at this time (pt. is chronic trach/vent, came from [name] for PEG complic)   Physical Therapy      Occupational Therapy      Nutritional [name] Current Diet: NPO    Case Management/Social Work  Plan for discharge back to [name] when ready.     Provider   [facility] Care Team Recommendations         [facility] Members -  Harlon Ditty, SLP, Gretta Cool, SW, Cherylin Mylar, RT      None         Cassell Voorhies, Silva Bandy (scribe for team) 07/02/2014, 1:44 PM

## 2014-07-02 NOTE — ED Notes (Signed)
G-tube removed by Dr. Lindie Spruce d/t malposition.

## 2014-07-02 NOTE — Progress Notes (Signed)
Complicated situation with poor prognosis GI will hopefully be able to help with percutaneous gastrostomy, which will be safer since the stomach is sutured to the anterior abdominal wall.  Open surgery would be risker than an endoscopic procedure.  Wilmon Arms. Corliss Skains, MD, [facility]  General/ Trauma Surgery  07/02/2014 11:36 AM

## 2014-07-02 NOTE — Progress Notes (Signed)
ANTIBIOTIC CONSULT NOTE - INITIAL  Pharmacy Consult for cipro, flagyl, vancomycin Indication: intra-abdominal infection, hx MRSA and enterococcus bacteremia  Allergies  Allergen Reactions  . Namenda [Memantine Hcl] Other (See Comments)    "hallucinations"   . Codeine Other (See Comments)    Unknown. Listed on [facility]  . Penicillins Other (See Comments)    Unknown; [facility]    Patient Measurements: Height: 5' 1.02" (155 cm) Weight: 146 lb 13.2 oz (66.6 kg) IBW/kg (Calculated) : 47.86   Vital Signs: Temp: 99.3 F (37.4 C) (08/19 2115) Temp src: Rectal (08/19 2115) BP: 116/49 mmHg (08/20 0015) Pulse Rate: 85 (08/20 0015) Intake/Output from previous day: 08/19 0701 - 08/20 0700 In: 1000 [I.V.:1000] Out: -  Intake/Output from this shift: Total I/O In: 1000 [I.V.:1000] Out: -   Labs:  Recent Labs  06/29/14 0513 06/30/14 0515 07/01/14 1843  WBC 12.9* 10.6* 13.5*  HGB 8.4* 8.7* 9.2*  PLT 309 306 318  CREATININE 0.43* 0.45* 0.39*   Estimated Creatinine Clearance: 56.4 ml/min (by C-G formula based on Cr of 0.39).  Recent Labs  06/29/14 0513  VANCOTROUGH 17.3     Microbiology: Recent Results (from the past 720 hour(s))  MRSA PCR SCREENING     Status: Abnormal   Collection Time    06/17/14  6:28 PM      Result Value Ref Range Status   MRSA by PCR POSITIVE (*) NEGATIVE Final   Comment:            The GeneXpert MRSA Assay (FDA     approved for NASAL specimens     only), is one component of a     comprehensive MRSA colonization     surveillance program. It is not     intended to diagnose MRSA     infection nor to guide or     monitor treatment for     MRSA infections.     RESULT CALLED TO, READ BACK BY AND VERIFIED WITH:     A BECK RN 2031 06/17/14 A BROWNING  SURGICAL PCR SCREEN     Status: Abnormal   Collection Time    06/26/14  8:20 AM      Result Value Ref Range Status   MRSA, PCR POSITIVE (*) NEGATIVE Final   Staphylococcus aureus POSITIVE (*) NEGATIVE Final   Comment:            The Xpert SA Assay (FDA     approved for NASAL specimens     in patients over 71 years of age),     is one component of     a comprehensive surveillance     program.  Test performance has     been validated by [facility] for patients greater     than or equal to 71 year old.     It is not intended     to diagnose infection nor to     guide or monitor treatment.    Medical History: Past Medical History  Diagnosis Date  . Hypertension   . 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 Arthritis   . Anxiety   . Cachexia 12/2013  . DM type 2 (diabetes mellitus, type 2)   . 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 Anemia 12/2013  . Dementia   . Compression fracture of lumbar vertebra, non-traumatic 12/2013    L3 and L1.  Medications:  See EMR Assessment: 73 year old female from [name]. Chronic vent/trach. D/c from [facility] 8/18 to [name], PEG noted to be dislodged 8/19. It was replaced at [name] but continued to have drainage and bubbling around tube. Brought to ED for evaluation. Patient known to pharmacy service for previous abx dosing. Will convert cipro/flagyl to IV (1:1) and continue vancomycin 1g q24 as previously ordered(patient receives in am daily). Last trough on this dose was 8/17 which resulted at 17.3.  Goal of Therapy:  Vancomycin trough level 15-20 mcg/ml  Plan:  Recheck VT 2-3d Follow up culture results Continue Vancomycin 1g q24 daily Cipro 400mg  IV q12 hours Flagyl 500mg  IV q8 hours  Sheppard Coil PharmD., BCPS Clinical Pharmacist Pager [phone] 07/02/2014 12:51 AM

## 2014-07-02 NOTE — Progress Notes (Signed)
Utilization Review Completed.  

## 2014-07-03 ENCOUNTER — Encounter (HOSPITAL_COMMUNITY): Payer: Self-pay | Admitting: Gastroenterology

## 2014-07-03 ENCOUNTER — Encounter (HOSPITAL_COMMUNITY)
Admission: EM | Disposition: A | Discharge status: Nursing Home (Includes ICF) | Payer: Self-pay | Source: Home / Self Care | Attending: Pulmonary Disease

## 2014-07-03 DIAGNOSIS — J9600 Acute respiratory failure, unspecified whether with hypoxia or hypercapnia: Secondary | ICD-10-CM

## 2014-07-03 HISTORY — PX: PEG PLACEMENT: SHX5437

## 2014-07-03 LAB — BASIC METABOLIC PANEL
ANION GAP: 8 (ref 5–15)
BUN: 17 mg/dL (ref 6–23)
CO2: 31 meq/L (ref 19–32)
CREATININE: 0.4 mg/dL — AB (ref 0.50–1.10)
Calcium: 7.8 mg/dL — ABNORMAL LOW (ref 8.4–10.5)
Chloride: 98 mEq/L (ref 96–112)
GFR calc Af Amer: >90 mL/min (ref >90)
GFR calc non Af Amer: >90 mL/min (ref >90)
GLUCOSE: 387 mg/dL — AB (ref 70–99)
Potassium: 4.4 mEq/L (ref 3.7–5.3)
Sodium: 137 mEq/L (ref 137–147)

## 2014-07-03 LAB — CBC
HCT: 25.5 % — ABNORMAL LOW (ref 36.0–46.0)
Hemoglobin: 8.3 g/dL — ABNORMAL LOW (ref 12.0–15.0)
MCH: 31.2 pg (ref 26.0–34.0)
MCHC: 32.5 g/dL (ref 30.0–36.0)
MCV: 95.9 fL (ref 78.0–100.0)
Platelets: 303 10*3/uL (ref 150–400)
RBC: 2.66 MIL/uL — ABNORMAL LOW (ref 3.87–5.11)
RDW: 14.7 % (ref 11.5–15.5)
WBC: 10.8 10*3/uL — ABNORMAL HIGH (ref 4.0–10.5)

## 2014-07-03 LAB — GLUCOSE, CAPILLARY
GLUCOSE-CAPILLARY: 140 mg/dL — AB (ref 70–99)
Glucose-Capillary: 107 mg/dL — ABNORMAL HIGH (ref 70–99)
Glucose-Capillary: 135 mg/dL — ABNORMAL HIGH (ref 70–99)
Glucose-Capillary: 139 mg/dL — ABNORMAL HIGH (ref 70–99)
Glucose-Capillary: 156 mg/dL — ABNORMAL HIGH (ref 70–99)
Glucose-Capillary: 175 mg/dL — ABNORMAL HIGH (ref 70–99)

## 2014-07-03 LAB — URINE CULTURE

## 2014-07-03 LAB — MAGNESIUM: Magnesium: 1.4 mg/dL — ABNORMAL LOW (ref 1.5–2.5)

## 2014-07-03 SURGERY — INSERTION, PEG TUBE
Anesthesia: Moderate Sedation

## 2014-07-03 MED ORDER — FAT EMULSION 20 % IV EMUL
250.0000 mL | INTRAVENOUS | Status: AC
  Administered 2014-07-03: 250 mL via INTRAVENOUS
  Filled 2014-07-03: qty 250

## 2014-07-03 MED ORDER — MIDAZOLAM HCL 10 MG/2ML IJ SOLN
INTRAMUSCULAR | Status: DC | PRN
  Administered 2014-07-03 (×2): 2 mg via INTRAVENOUS

## 2014-07-03 MED ORDER — MAGNESIUM SULFATE 40 MG/ML IJ SOLN
2.0000 g | Freq: Once | INTRAMUSCULAR | Status: AC
  Administered 2014-07-03: 2 g via INTRAVENOUS
  Filled 2014-07-03: qty 50

## 2014-07-03 MED ORDER — MIDAZOLAM HCL 5 MG/ML IJ SOLN
INTRAMUSCULAR | Status: AC
  Filled 2014-07-03: qty 1

## 2014-07-03 MED ORDER — KCL IN DEXTROSE-NACL 20-5-0.45 MEQ/L-%-% IV SOLN
INTRAVENOUS | Status: AC
  Administered 2014-07-04: 02:00:00 via INTRAVENOUS
  Filled 2014-07-03 (×3): qty 1000

## 2014-07-03 MED ORDER — FENTANYL CITRATE 0.05 MG/ML IJ SOLN
INTRAMUSCULAR | Status: AC
  Filled 2014-07-03: qty 2

## 2014-07-03 MED ORDER — FENTANYL CITRATE 0.05 MG/ML IJ SOLN
INTRAMUSCULAR | Status: DC | PRN
  Administered 2014-07-03 (×2): 25 ug via INTRAVENOUS

## 2014-07-03 MED ORDER — TRACE MINERALS CR-CU-F-FE-I-MN-MO-SE-ZN IV SOLN
INTRAVENOUS | Status: DC
  Administered 2014-07-03: 18:00:00 via INTRAVENOUS
  Filled 2014-07-03: qty 1000

## 2014-07-03 MED ORDER — FAT EMULSION 20 % IV EMUL
250.0000 mL | INTRAVENOUS | Status: DC
Start: 2014-07-03 — End: 2014-07-03
  Filled 2014-07-03: qty 250

## 2014-07-03 MED ORDER — TRACE MINERALS CR-CU-F-FE-I-MN-MO-SE-ZN IV SOLN
INTRAVENOUS | Status: DC
  Filled 2014-07-03: qty 1000

## 2014-07-03 NOTE — Progress Notes (Signed)
[facility] Progress Note  Day of Surgery  Subjective: Pt minimally arousable. Does not follow commands. Moves to painful stimuli. No family at bedside.  GI set up at bedside for bedside PEG.     Objective: Vital signs in last 24 hours: Temp:  [98.6 F (37 C)-99.4 F (37.4 C)] 99.4 F (37.4 C) (08/21 0526) Pulse Rate:  [81-112] 95 (08/21 0706) Resp:  [11-26] 22 (08/21 0706) BP: (104-137)/(51-70) 115/60 mmHg (08/21 0706) SpO2:  [97 %-100 %] 99 % (08/21 0706) FiO2 (%):  [35 %] 35 % (08/21 0303) Weight:  [126 lb 1.7 oz (57.2 kg)] 126 lb 1.7 oz (57.2 kg) (08/21 0526) Last BM Date: 07/02/14  Intake/Output from previous day: 08/20 0701 - 08/21 0700 In: 2750.7 [I.V.:1850.7; IV Piggyback:900] Out: 350 [Urine:225; Stool:125] Intake/Output this shift:    PE: Gen:  Sleepy, NAD, alert to pain perception only Abd: Soft, noticeably tender to palpation over G-tube site and midline wound, ND, +BS, no HSM, midline incision intact with minor separation of fascia and slough, no visible granulation tissue, g-tube site with minimal serous drainage, indurated around old G tube site  Ext: Edematous, contracted   Lab Results:   Recent Labs  07/02/14 0346 07/03/14 0620  WBC 9.9 10.8*  HGB 7.7* 8.3*  HCT 25.3* 25.5*  PLT 282 303   BMET  Recent Labs  07/01/14 1843 07/02/14 0346  NA 145 141  K 3.8 3.1*  CL 100 101  CO2 36* 33*  GLUCOSE 178* 87  BUN 27* 23  CREATININE 0.39* 0.41*  CALCIUM 8.6 8.4   PT/INR No results found for this basename: LABPROT, INR,  in the last 72 hours CMP     Component Value Date/Time   NA 141 07/02/2014 0346   K 3.1* 07/02/2014 0346   CL 101 07/02/2014 0346   CO2 33* 07/02/2014 0346   GLUCOSE 87 07/02/2014 0346   BUN 23 07/02/2014 0346   CREATININE 0.41* 07/02/2014 0346   CALCIUM 8.4 07/02/2014 0346   PROT 5.4* 07/01/2014 1843   ALBUMIN 2.3* 07/01/2014 1843   AST 21 07/01/2014 1843   ALT 22 07/01/2014 1843   ALKPHOS 89 07/01/2014 1843    BILITOT 0.2* 07/01/2014 1843   GFRNONAA >90 07/02/2014 0346   GFRAA >90 07/02/2014 0346   Lipase     Component Value Date/Time   LIPASE 14 07/01/2014 1843       Studies/Results: Ct Abdomen Pelvis W Contrast  07/02/2014   CLINICAL DATA:  Possible infection. " "GI problem"  EXAM: CT ABDOMEN AND PELVIS WITH CONTRAST  TECHNIQUE: Multidetector CT imaging of the abdomen and pelvis was performed using the standard protocol following bolus administration of intravenous contrast.  CONTRAST:  100mL OMNIPAQUE IOHEXOL 300 MG/ML  SOLN  COMPARISON:  05/08/2014  FINDINGS: BODY WALL: There is anasarca.  P  LOWER CHEST: Small layering pleural effusions and bibasilar atelectasis. Centrilobular emphysema.  ABDOMEN/PELVIS:  Liver: Essentially negative.  Biliary: No evidence of biliary obstruction or stone.  Pancreas: Unchanged mild enlargement of the main duct. No visible mass, inflammation, or calculus.  Spleen: Unremarkable.  Adrenals: Unremarkable.  Kidneys and ureters: 4.3 cm right renal cyst, herniating into the pelvis. No hydronephrosis.  Bladder: Decompressed around a Foley catheter.  Reproductive: There may have been hysterectomy.  No adnexal mass.  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 Contrast was injected through the nasogastric tube, which continued  through small bowel and colon into the right abdomen colostomy. The colostomy is edematous status post recent surgery. There are loops of small bowel in the left abdomen which are dilated and fluid-filled, but do not contain positive oral contrast. These are presumably in continuity with the contrast containing segments given the surgical history. Extraluminal more dense contrast is seen in the left abdomen and possibly in the pelvis. Reportedly there was injection of barium for tube placement confirmation at outside hospital. The recently placed percutaneous  gastrostomy tube is extra luminal, tip in the left abdomen. There is no extravasation of the more low-density positive oral contrast to suggest active leak. Pneumoperitoneum is present in the upper abdomen. Some retained barium present within the rectum.  OSSEOUS: No acute abnormalities.  IMPRESSION: 1. Extra luminal percutaneous gastrostomy tube after replacement. Pneumoperitoneum and extraluminal barium likely related to prior attempts to replace the catheter. No extravasation of contrast administered via the nasogastric tube. 2. Dilated small bowel loops with fluid levels and angulation consistent with partial obstruction. Contrast via nasogastric tube does reach the right abdominal transverse colostomy.   Electronically Signed   By: Tiburcio Pea M.D.   On: 07/02/2014 01:09   Dg Chest Portable 1 View  07/01/2014   CLINICAL DATA:  GI problem.  COPD.  Diabetes.  Dementia.  EXAM: PORTABLE CHEST - 1 VIEW  COMPARISON:  06/23/2014  FINDINGS: Moderate patient rotation to the right. A right-sided PICC line terminates at the low SVC. Tracheostomy is grossly normal in position.  Cardiomegaly. Suspect a small right pleural effusion. No pneumothorax. No gross congestive failure. Improved bibasilar aeration. There may be mild left base atelectasis remaining.  Left upper quadrant gas could be within small bowel loops or the stomach.  IMPRESSION: Moderately position degraded exam. Possible trace right pleural fluid and mild left base atelectasis. No gross acute disease. Overall improved aeration since 06/23/2014.   Electronically Signed   By: Jeronimo Greaves M.D.   On: 07/01/2014 19:25   Dg Abd Portable 1v  07/01/2014   CLINICAL DATA:  Abdominal pain.  EXAM: PORTABLE ABDOMEN - 1 VIEW  COMPARISON:  06/17/2014  FINDINGS: There is some scattered residual contrast in the colon. Air-filled small bowel loops are noted. Could not exclude a small bowel obstruction. The NG tube tip is in the antral region of the stomach.   IMPRESSION: NG tube is in the stomach.  Dilated air-filled small bowel loops, suggesting small bowel obstruction.   Electronically Signed   By: Loralie Champagne M.D.   On: 07/01/2014 21:04    Anti-infectives: Anti-infectives   Start     Dose/Rate Route Frequency Ordered Stop   07/02/14 0800  metroNIDAZOLE (FLAGYL) IVPB 500 mg     500 mg 100 mL/hr over 60 Minutes Intravenous Every 8 hours 07/02/14 0054     07/02/14 0600  vancomycin (VANCOCIN) IVPB 1000 mg/200 mL premix     1,000 mg 200 mL/hr over 60 Minutes Intravenous Every 24 hours 07/02/14 0054     07/02/14 0200  ciprofloxacin (CIPRO) IVPB 400 mg     400 mg 200 mL/hr over 60 Minutes Intravenous Every 12 hours 07/02/14 0054         Assessment/Plan Transcolonic percutaneous gastrostomy tube POD #7 s/p segmental transverse colectomy, wedge gastrectomy, open Stamm gastrostomy, End colostomy  Now with Dislodged G-tube   Plan:  1. Continue wound care, add abdominal binder, no PO by mouth 2. Dr. Elnoria Howard agreeable to attempt replacement of G tube endoscopically.  This  does come with risk since the stomach has  is sutured to the anterior abdominal wall, but open surgery would be more risky than endoscopic procedure.   3. Surgery is not a good option at this point. The wound is not healing well, there is no granulation tissue 4. Suggest TPN for now, then tube feeds at the earliest Sunday or Monday to allow old gastrostomy tube site time to heal since Dr. Elnoria Howard needed to create another G-tube site 5. SCD's and heparin     LOS: 2 days    Aris Georgia 07/03/2014, 7:26 AM Pager: [phone]

## 2014-07-03 NOTE — Progress Notes (Addendum)
INITIAL NUTRITION ASSESSMENT  DOCUMENTATION CODES Per approved criteria  -Severe malnutrition in the context of chronic illness   INTERVENTION:  TPN per pharmacy  RD to follow for nutrition care plan  NUTRITION DIAGNOSIS: Inadequate oral intake related to inability to eat as evidenced by NPO status  Goal: Pt to meet >/= 90% of their estimated nutrition needs   Monitor:  TF regimen & tolerance, respiratory status, weight, labs, I/O's  Reason for Assessment: Consult  73 y.o. female  Admitting Dx: PEG tube malfunction    ASSESSMENT: 73 year old female from [name]. Chronic vent/trach. [name], PEG noted to be dislodged 8/19. It was replaced at [name] but continued to have drainage and bubbling around tube. Brought to ED for evaluation. PCCM called for admission.   [name] usual TF regimen is Isosource 1.5: 300 ml every 6 hours with free water flushes 30 ml/h to provide 1800 kcals, 82 gm protein, 1637 ml free water daily.  Patient is currently on ventilator support -- trach MV: 6.4 L/min Temp (24hrs), Avg:98.8 F (37.1 C), Min:98.2 F (36.8 C), Max:99.4 F (37.4 C)   Patient s/p procedure 8/21: EGD WITH PEG TUBE PLACEMENT  Per GI, unable to use PEG for next 48 hours.  RD consulted for TPN initiation.  Patient is receiving TPN with Clinimix E 5/15 @ 40 ml/hr and lipids @ 10 ml/hr. Provides 1162 kcal and 48 grams protein per day. Meets 83% minimum estimated energy needs and 53% minimum estimated protein needs.  Nutrition Focused Physical Exam:   Subcutaneous Fat:  Orbital Region: severe depletion  Upper Arm Region: severe depletion  Thoracic and Lumbar Region: NA   Muscle:  Temple Region: severe depletion  Clavicle Bone Region: severe depletion  Clavicle and Acromion Bone Region: severe depletion  Scapular Bone Region: NA  Dorsal Hand: severe depletion  Patellar Region: severe depletion  Anterior Thigh Region: severe depletion  Posterior  Calf Region: severe depletion   Edema: none  Patient meets criteria for severe malnutrition in the context of chronic illness as evidenced by severe depletion of muscle and subcutaneous fat mass.   Height: Ht Readings from Last 1 Encounters:  07/02/14 5\' 1"  (1.549 m)    Weight: Wt Readings from Last 1 Encounters:  07/03/14 126 lb 1.7 oz (57.2 kg)    Ideal Body Weight: 105 lb  % Ideal Body Weight: 120%  Wt Readings from Last 10 Encounters:  07/03/14 126 lb 1.7 oz (57.2 kg)  07/03/14 126 lb 1.7 oz (57.2 kg)  06/30/14 146 lb 13.2 oz (66.6 kg)  06/30/14 146 lb 13.2 oz (66.6 kg)  02/05/14 87 lb (39.463 kg)  01/13/14 86 lb 14.4 oz (39.418 kg)  12/27/13 86 lb 13.8 oz (39.4 kg)    Usual Body Weight: 87 lb -- March 2015  % Usual Body Weight: 144%  BMI:  Body mass index is 23.84 kg/(m^2).  Estimated Nutritional Needs: Kcal: 1400-1600 Protein: 90-100 gm Fluid: >/= 1.5 L1  Skin: Stage IV sacral ulcer, abdominal wound   Diet Order: NPO  EDUCATION NEEDS: -No education needs identified at this time   Intake/Output Summary (Last 24 hours) at 07/03/14 0945 Last data filed at 07/03/14 0852  Gross per 24 hour  Intake 2650.67 ml  Output    350 ml  Net 2300.67 ml    Labs:   Recent Labs Lab 06/28/14 0402 06/29/14 0513 06/30/14 0515 07/01/14 1843 07/02/14 0346 07/03/14 0620  NA 136* 142 143 145 141 137  K 4.8 3.6* 3.8 3.8 3.1* 4.4  CL 95* 99 100 100 101 98  CO2 31 37* 35* 36* 33* 31  BUN 34* 32* 39* 27* 23 17  CREATININE 0.51 0.43* 0.45* 0.39* 0.41* 0.40*  CALCIUM 8.8 8.9 8.7 8.6 8.4 7.8*  MG 2.1 2.0 2.0  --   --  1.4*  PHOS 3.2 3.5 3.4  --   --   --   GLUCOSE 127* 111* 143* 178* 87 387*    CBG (last 3)   Recent Labs  07/03/14 0031 07/03/14 0525 07/03/14 0823  GLUCAP 135* 140* 156*    Scheduled Meds: . ciprofloxacin  400 mg Intravenous Q12H  . heparin  5,000 Units Subcutaneous 3 times per day  . hydrocortisone sodium succinate  50 mg Intravenous  Q6H  . insulin aspart  0-9 Units Subcutaneous 6 times per day  . metronidazole  500 mg Intravenous Q8H  . pantoprazole (PROTONIX) IV  40 mg Intravenous QHS  . vancomycin  1,000 mg Intravenous Q24H    Continuous Infusions: . sodium chloride Stopped (07/02/14 2128)  . dextrose 5 % and 0.45 % NaCl with KCl 20 mEq/L 75 mL/hr (07/03/14 6734)    Past Medical History  Diagnosis Date  . Hypertension   . 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 Arthritis   . Anxiety   . Cachexia 12/2013  . DM type 2 (diabetes mellitus, type 2)   . 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 Anemia 12/2013  . Dementia   . Compression fracture of lumbar vertebra, non-traumatic 12/2013    L3 and L1.     Past Surgical History  Procedure Laterality Date  . Vaginal hysterectomy      ? Abdominal vs. Vaginal  . Laparotomy N/A 06/26/2014    Procedure: EXPLORATORY LAPAROTOMY;  Surgeon: Emelia Loron, MD;  Location: [facility] OR;  Service: General;  Laterality: N/A;  . Partial colectomy N/A 06/26/2014    Procedure: TRANSVERSE COLECTOMY/COLOSTOMY;  Surgeon: Emelia Loron, MD;  Location[facility] OR;  Service: General;  Laterality: N/A;  . Gastrostomy N/A 06/26/2014    Procedure: NEW GASTROSTOMY TUBE;  Surgeon: Emelia Loron, MD;  Location: [facility] OR;  Service: General;  Laterality: N/A;    Maureen Chatters, RD, LDN Pager #: [phone] After-Hours Pager #: [phone]

## 2014-07-03 NOTE — Progress Notes (Signed)
PARENTERAL NUTRITION CONSULT NOTE   Pharmacy Consult for TPN Indication: PEG tube malfunction   Patient Measurements: Height: 5\' 1"  (154.9 cm) Weight: 126 lb 1.7 oz (57.2 kg) IBW/kg (Calculated) : 47.8   Vital Signs: Temp: 98.2 F (36.8 C) (08/21 0851) Temp src: Oral (08/21 0851) BP: 108/62 mmHg (08/21 0815) Pulse Rate: 96 (08/21 0815) Intake/Output from previous day: 08/20 0701 - 08/21 0700 In: 2750.7 [I.V.:1850.7; IV Piggyback:900] Out: 350 [Urine:225; Stool:125] Intake/Output from this shift:    Labs:  Recent Labs  07/01/14 1843 07/02/14 0346 07/03/14 0620  WBC 13.5* 9.9 10.8*  HGB 9.2* 7.7* 8.3*  HCT 30.1* 25.3* 25.5*  PLT 318 282 303     Recent Labs  07/01/14 1843 07/02/14 0346 07/03/14 0620  NA 145 141 137  K 3.8 3.1* 4.4  CL 100 101 98  CO2 36* 33* 31  GLUCOSE 178* 87 387*  BUN 27* 23 17  CREATININE 0.39* 0.41* 0.40*  CALCIUM 8.6 8.4 7.8*  MG  --   --  1.4*  PROT 5.4*  --   --   ALBUMIN 2.3*  --   --   AST 21  --   --   ALT 22  --   --   ALKPHOS 89  --   --   BILITOT 0.2*  --   --    Estimated Creatinine Clearance: 48.7 ml/min (by C-G formula based on Cr of 0.4).    Recent Labs  07/03/14 0031 07/03/14 0525 07/03/14 0823  GLUCAP 135* 140* 156*    Insulin Requirements in the past 24 hours:  6 units SSI  Current Nutrition:  NPO  Nutritional Goals:   1400-1600 kCal, 90-10 grams of protein per day (goal change per RD note 8/17)  Assessment: 73 year old female from [name]. Chronic vent/trach. D/c from [facility] 8/18 to [name], PEG noted to be dislodged 8/19. It was replaced at [name] but continued to have drainage and bubbling around tube. Brought to ED for evaluation. Marland Kitchen  GI: 8/5 - 8/18 > admission to [facility]  8/14 - To OR for segmental transverse colectomy, wedge gastrectomy, open Stamm gastrostomy, and End colostomy  8/19 - PEG dislodged, attempted to replace at [facility]. drainage at site, to ED.  8/21 PEG replaced in ENDO by Dr Elnoria Howard.  Started on TNA per Surg recs.    Endo: DM.  She is  on chronic steroids- currently on stress dose IV HC- 50mg  IV q6h. CBGs/24h 99-156  Lytes: K 4.4, mg 1.4, Na 137  Renal: Scr 0.4, UOP 0.3 mL/kg/hr   Pulm: COPD, chronic trach on vent support.  Cards: Hx HTN. BP soft-wnl, HR normal-tachy- currently on no CV meds.  Hepatobil: LFT's WNL, alb 2.3. Trigs 25 on 8/17  Neuro: Anxiety, dementia, lumbar compression fx.   ID: Vanco/Flagyl/Cipro (all since 8/4) for intraabdominal process. Afebrile. WBC 10.8  Best Practices: IV PPI, Heparin SQ  TPN Access: 8/6 double-lumen PICC line  TPN day#: 8/10 >> 8/18; resumed 8/21  Plan:  1. Start Clinimix E 5/15 at 46ml/hr + lipid 20% emulsion at 10 ml/hr.  Add 8 units of regular insulin 2. Continue CBGs and SSI as ordered 3. Magnesium 2g IV x 1 dose 4. TPN labs tomorrow and every Monday and Thursday 5. Monitor for restarting tube feeds 6. Reduce IV fluids to 35 mL/hr when TPN starts  Celedonio Miyamoto, PharmD, [facility] Clinical Pharmacist Pager [phone]   07/03/2014 9:01 AM

## 2014-07-03 NOTE — Progress Notes (Signed)
PULMONARY / CRITICAL CARE MEDICINE   Name: Katelyn Rojas MRN: [id number] DOB: November 30, 1940    ADMISSION DATE:  07/01/2014  REFERRING MD :  EDP  CHIEF COMPLAINT:  PEG tube malfunction  INITIAL PRESENTATION: 73 year old female from [name]. Chronic vent/trach. D/c from [facility] 8/18 to [name], PEG noted to be dislodged 8/19. It was replaced at [name] but continued to have drainage and bubbling around tube. Brought to ED for evaluation. PCCM called for admission.  STUDIES:  8/19 - XR abdomen: NG tube in stomach, air filled bowel loops. Concern for obstruction.  8/19 CT abdomen/pelvis: >>>  SIGNIFICANT EVENTS: 8/5 - 8/18 > admission to [facility] 8/14 - To OR for segmental transverse colectomy, wedge gastrectomy, open Stamm gastrostomy, and End colostomy 8/19 - PEG dislodged, attempted to replace at [facility]. drainage at site, to ED. 8/21 PEG replaced in ENDO by Dr Elnoria Howard. Started on TNA per Surg recs.    SUBJECTIVE:  Resting comfortable.  On full vent support.  VITAL SIGNS: Temp:  [98.6 F (37 C)-99.4 F (37.4 C)] 99.4 F (37.4 C) (08/21 0526) Pulse Rate:  [81-108] 96 (08/21 0815) Resp:  [11-26] 26 (08/21 0815) BP: (95-137)/(56-70) 108/62 mmHg (08/21 0815) SpO2:  [97 %-100 %] 100 % (08/21 0815) FiO2 (%):  [35 %] 35 % (08/21 0822) Weight:  [57.2 kg (126 lb 1.7 oz)] 57.2 kg (126 lb 1.7 oz) (08/21 0526) VENTILATOR SETTINGS: Vent Mode:  [-] PRVC FiO2 (%):  [35 %] 35 % Set Rate:  [14 bmp] 14 bmp Vt Set:  [460 mL] 460 mL PEEP:  [5 cmH20] 5 cmH20 Plateau Pressure:  [16 cmH20-24 cmH20] 16 cmH20 INTAKE / OUTPUT:  Intake/Output Summary (Last 24 hours) at 07/03/14 0843 Last data filed at 07/03/14 0400  Gross per 24 hour  Intake 2650.67 ml  Output    350 ml  Net 2300.67 ml    PHYSICAL EXAMINATION: General:  Elderly female, on vent, in NAD Neuro:  Non-verbal, awake, alert.  HEENT:  /AT, PERRL. Trach in place Cardiovascular:  RRR, no MRG Lungs:  Clear anterior, synchronous with vent,  breathing over set rate Abdomen:  Soft, dressing intact. PEG in place. Tender to palp  Musculoskeletal:  No acute deformity.  Skin:  Intact, +1 pitting peripheral edema.   LABS:  CBC  Recent Labs Lab 07/01/14 1843 07/02/14 0346 07/03/14 0620  WBC 13.5* 9.9 10.8*  HGB 9.2* 7.7* 8.3*  HCT 30.1* 25.3* 25.5*  PLT 318 282 303   Coag's No results found for this basename: APTT, INR,  in the last 168 hours BMET  Recent Labs Lab 07/01/14 1843 07/02/14 0346 07/03/14 0620  NA 145 141 137  K 3.8 3.1* 4.4  CL 100 101 98  CO2 36* 33* 31  BUN 27* 23 17  CREATININE 0.39* 0.41* 0.40*  GLUCOSE 178* 87 387*   Electrolytes  Recent Labs Lab 06/28/14 0402 06/29/14 0513 06/30/14 0515 07/01/14 1843 07/02/14 0346 07/03/14 0620  CALCIUM 8.8 8.9 8.7 8.6 8.4 7.8*  MG 2.1 2.0 2.0  --   --  1.4*  PHOS 3.2 3.5 3.4  --   --   --    Sepsis Markers  Recent Labs Lab 07/01/14 1952  LATICACIDVEN 1.54   ABG  Recent Labs Lab 07/02/14 0052  PHART 7.479*  PCO2ART 49.0*  PO2ART 76.0*   Liver Enzymes  Recent Labs Lab 06/29/14 0513 07/01/14 1843  AST 16 21  ALT 13 22  ALKPHOS 82 89  BILITOT 0.3 0.2*  ALBUMIN  2.6* 2.3*   Glucose  Recent Labs Lab 07/02/14 1152 07/02/14 1636 07/02/14 1936 07/03/14 0031 07/03/14 0525 07/03/14 0823  GLUCAP 111* 163* 99 135* 140* 156*    Imaging Ct Abdomen Pelvis W Contrast  07/02/2014   CLINICAL DATA:  Possible infection. " "GI problem"  EXAM: CT ABDOMEN AND PELVIS WITH CONTRAST  TECHNIQUE: Multidetector CT imaging of the abdomen and pelvis was performed using the standard protocol following bolus administration of intravenous contrast.  CONTRAST:  100mL OMNIPAQUE IOHEXOL 300 MG/ML  SOLN  COMPARISON:  05/08/2014  FINDINGS: BODY WALL: There is anasarca.  P  LOWER CHEST: Small layering pleural effusions and bibasilar atelectasis. Centrilobular emphysema.  ABDOMEN/PELVIS:  Liver: Essentially negative.  Biliary: No evidence of biliary  obstruction or stone.  Pancreas: Unchanged mild enlargement of the main duct. No visible mass, inflammation, or calculus.  Spleen: Unremarkable.  Adrenals: Unremarkable.  Kidneys and ureters: 4.3 cm right renal cyst, herniating into the pelvis. No hydronephrosis.  Bladder: Decompressed around a Foley catheter.  Reproductive: There may have been hysterectomy.  No adnexal mass.  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 Contrast was injected through the nasogastric tube, which continued through small bowel and colon into the right abdomen colostomy. The colostomy is edematous status post recent surgery. There are loops of small bowel in the left abdomen which are dilated and fluid-filled, but do not contain positive oral contrast. These are presumably in continuity with the contrast containing segments given the surgical history. Extraluminal more dense contrast is seen in the left abdomen and possibly in the pelvis. Reportedly there was injection of barium for tube placement confirmation at outside hospital. The recently placed percutaneous gastrostomy tube is extra luminal, tip in the left abdomen. There is no extravasation of the more low-density positive oral contrast to suggest active leak. Pneumoperitoneum is present in the upper abdomen. Some retained barium present within the rectum.  OSSEOUS: No acute abnormalities.  IMPRESSION: 1. Extra luminal percutaneous gastrostomy tube after replacement. Pneumoperitoneum and extraluminal barium likely related to prior attempts to replace the catheter. No extravasation of contrast administered via the nasogastric tube. 2. Dilated small bowel loops with fluid levels and angulation consistent with partial obstruction. Contrast via nasogastric tube does reach the right abdominal transverse colostomy.   Electronically Signed   By: Tiburcio PeaJonathan  Watts M.D.   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  Tracheostomy status with respirator dependence. P:   Pressure support wean as tolerated F/u CXR, ABG as needed  PRN albuterol  CARDIOVASCULAR Rt PICC 8/6 >>>  A:  Chronic Hypotension - SBP baseline 80's P:  Monitor hemodynamics  RENAL A:   Hypokalemia. P:   F/u and replace electrolytes as needed Monitor renal fx, urine outpt  GASTROINTESTINAL A:   PEG malfunctioning ?SBO Severe protein calorie malnutrition. P:   FOR PEG placement by GI 8/21 Start TNA (per surg recs) protonix for SUP  HEMATOLOGIC A:   Anemia of chronic disease. P:  Follow CBC VTE ppx - Heparin  INFECTIOUS A:   Hx MRSA and enterococcus bacteremia. Hx Tracheitis (cultures + for Alcaligenes bacteria).  Sacral decubitus ulcer. P:   UC 8/19 >>> Continue pre-admission cipro, flagyl, vancomycin. Change to IV  ENDOCRINE A:   DM type II. Relative AI. P:   SSI Stress dose  steriods  NEUROLOGIC A:   Dementia. Anxiety. P:   RASS goal: 0 PRN fentanyl for RASS goal and pain.  Resume her aricept as soon as able   Goals of Care >> Family remains optimistic that she will improve/recover and feel she was making good progress with care at [name].  They would like her to return to [name] once her current acute medical/surgical issues are addressed.    Patient seen and examined, agree with above note.  I dictated the care and orders written for this patient under my direction.  [name], MD [phone]

## 2014-07-03 NOTE — Progress Notes (Deleted)
Pt with ulcerated area around trach, Rn aware. A little bloody drainage.

## 2014-07-03 NOTE — H&P (View-Only) (Signed)
The patient's surgically placed PEG dislodged.  I will attempt endoscopic placement of a PEG tube. 

## 2014-07-03 NOTE — Progress Notes (Signed)
Observed PEG placement by Dr. Elnoria Howard.  No apparent complications.  Old gastrostomy site visualized.  [name] prognosis remains poor  Do not use feeding tube for at least 48 hours.Katelyn Rojas. Katelyn Skains, MD, [facility]  General/ Trauma Surgery  07/03/2014 10:19 AM

## 2014-07-03 NOTE — Progress Notes (Signed)
RT note-Called to assist with bedside PEG tube procedure. Trach cuff deflated during procedure, patient did maintain VT during procedure and remained on full support. Cuff was then re-inflated and checked with manometer.

## 2014-07-03 NOTE — Interval H&P Note (Signed)
History and Physical Interval Note:  07/03/2014 7:43 AM  Katelyn Rojas  has presented today for surgery, with the diagnosis of Feeding difficulties.  The various methods of treatment have been discussed with the patient and family. After consideration of risks, benefits and other options for treatment, the patient has consented to  Procedure(s): PERCUTANEOUS ENDOSCOPIC GASTROSTOMY (PEG) PLACEMENT (N/A) as a surgical intervention .  The patient's history has been reviewed, patient examined, no change in status, stable for surgery.  I have reviewed the patient's chart and labs.  Questions were answered to the patient's satisfaction.     Wyn Nettle D

## 2014-07-03 NOTE — Op Note (Signed)
Eligha Bridegroom [facility] [address] [place], [address]   OPERATIVE PROCEDURE REPORT  PATIENT: Katelyn, Rojas  MR#: [id number] BIRTHDATE: 28-Jun-1941  GENDER: Female ENDOSCOPIST: Jeani Hawking, MD ASSISTANT:   Olene Craven, technician and Hilbert Odor, RN  PROCEDURE DATE: 07/03/2014 PROCEDURE:   EGD w/ percutaneous gastrostomy tube placement ASA CLASS:   Class III INDICATIONS:Feeding difficulties. MEDICATIONS: Versed 4 mg IV and Fentanyl 50 mcg IV TOPICAL ANESTHETIC:   none  DESCRIPTION OF PROCEDURE:   After the risks benefits and alternatives of the procedure were thoroughly explained, informed consent was obtained.  The     endoscope was introduced through the mouth  and advanced to the second portion of the duodenum Without limitations.      The instrument was slowly withdrawn as the mucosa was fully examined.     FINDINGS: In the distal gastric lumen there was evidence of gastric surgery.  The prior gastrostomy site was identified.  Externally, the gastrostomy was identified and the area was prepped and drapped in a sterile fashion.  Two ml of 1:10,000 lidocaine was injected and then the itronducer needle was inserted.  In an oblique angle with the tip towards the right side the introducer needle was visualized inthe gastric lumen just lateral the the surgical gastrostomy.  The guidewire was grasped with the snare and pulled out through the oral cavity.  The 24 Fr Pull PEG was then attached to the guidewire and pulled through and secured in the gastric lumen.  Endoscopic viaulaization revealed that it was in excellent position.          The scope was then withdrawn from the patient and the procedure terminated.  COMPLICATIONS: There were no complications.  IMPRESSION: 1) Successful PEG tube placement.  RECOMMENDATIONS: 1) Do not use PEG tube for the next 48 hours.  _______________________________ eSignedJeani Hawking, MD 07/03/2014  10:19 AM

## 2014-07-04 DIAGNOSIS — J189 Pneumonia, unspecified organism: Secondary | ICD-10-CM

## 2014-07-04 DIAGNOSIS — R5381 Other malaise: Secondary | ICD-10-CM

## 2014-07-04 DIAGNOSIS — R5383 Other fatigue: Secondary | ICD-10-CM

## 2014-07-04 LAB — COMPREHENSIVE METABOLIC PANEL
ALT: 14 U/L (ref 0–35)
AST: 27 U/L (ref 0–37)
Albumin: 2.1 g/dL — ABNORMAL LOW (ref 3.5–5.2)
Alkaline Phosphatase: 73 U/L (ref 39–117)
Anion gap: 11 (ref 5–15)
BUN: 15 mg/dL (ref 6–23)
CALCIUM: 8.2 mg/dL — AB (ref 8.4–10.5)
CHLORIDE: 97 meq/L (ref 96–112)
CO2: 30 mEq/L (ref 19–32)
CREATININE: 0.44 mg/dL — AB (ref 0.50–1.10)
GLUCOSE: 267 mg/dL — AB (ref 70–99)
Potassium: 3.9 mEq/L (ref 3.7–5.3)
Sodium: 138 mEq/L (ref 137–147)
Total Bilirubin: 0.3 mg/dL (ref 0.3–1.2)
Total Protein: 5 g/dL — ABNORMAL LOW (ref 6.0–8.3)

## 2014-07-04 LAB — CBC
HEMATOCRIT: 27.4 % — AB (ref 36.0–46.0)
Hemoglobin: 8.9 g/dL — ABNORMAL LOW (ref 12.0–15.0)
MCH: 31.4 pg (ref 26.0–34.0)
MCHC: 32.5 g/dL (ref 30.0–36.0)
MCV: 96.8 fL (ref 78.0–100.0)
PLATELETS: 325 10*3/uL (ref 150–400)
RBC: 2.83 MIL/uL — ABNORMAL LOW (ref 3.87–5.11)
RDW: 14.6 % (ref 11.5–15.5)
WBC: 11.1 10*3/uL — ABNORMAL HIGH (ref 4.0–10.5)

## 2014-07-04 LAB — PREALBUMIN: PREALBUMIN: 24.2 mg/dL (ref 17.0–34.0)

## 2014-07-04 LAB — DIFFERENTIAL
BASOS ABS: 0 10*3/uL (ref 0.0–0.1)
Basophils Relative: 0 % (ref 0–1)
EOS PCT: 0 % (ref 0–5)
Eosinophils Absolute: 0 10*3/uL (ref 0.0–0.7)
Lymphocytes Relative: 5 % — ABNORMAL LOW (ref 12–46)
Lymphs Abs: 0.6 10*3/uL — ABNORMAL LOW (ref 0.7–4.0)
MONO ABS: 0.7 10*3/uL (ref 0.1–1.0)
Monocytes Relative: 6 % (ref 3–12)
Neutro Abs: 9.9 10*3/uL — ABNORMAL HIGH (ref 1.7–7.7)
Neutrophils Relative %: 89 % — ABNORMAL HIGH (ref 43–77)

## 2014-07-04 LAB — GLUCOSE, CAPILLARY
GLUCOSE-CAPILLARY: 140 mg/dL — AB (ref 70–99)
GLUCOSE-CAPILLARY: 26 mg/dL — AB (ref 70–99)
GLUCOSE-CAPILLARY: 28 mg/dL — AB (ref 70–99)
Glucose-Capillary: 108 mg/dL — ABNORMAL HIGH (ref 70–99)
Glucose-Capillary: 130 mg/dL — ABNORMAL HIGH (ref 70–99)
Glucose-Capillary: 190 mg/dL — ABNORMAL HIGH (ref 70–99)
Glucose-Capillary: 40 mg/dL — CL (ref 70–99)
Glucose-Capillary: 40 mg/dL — CL (ref 70–99)
Glucose-Capillary: 45 mg/dL — ABNORMAL LOW (ref 70–99)
Glucose-Capillary: 50 mg/dL — ABNORMAL LOW (ref 70–99)
Glucose-Capillary: 78 mg/dL (ref 70–99)
Glucose-Capillary: 96 mg/dL (ref 70–99)

## 2014-07-04 LAB — TRIGLYCERIDES: Triglycerides: 332 mg/dL — ABNORMAL HIGH (ref <150)

## 2014-07-04 LAB — PHOSPHORUS: Phosphorus: 3.9 mg/dL (ref 2.3–4.6)

## 2014-07-04 LAB — MAGNESIUM: MAGNESIUM: 2 mg/dL (ref 1.5–2.5)

## 2014-07-04 MED ORDER — DEXTROSE 50 % IV SOLN
50.0000 mL | Freq: Once | INTRAVENOUS | Status: AC | PRN
  Administered 2014-07-04: 50 mL via INTRAVENOUS

## 2014-07-04 MED ORDER — SODIUM CHLORIDE 4 MEQ/ML IV SOLN
INTRAVENOUS | Status: DC
  Administered 2014-07-04: 16:00:00 via INTRAVENOUS
  Filled 2014-07-04 (×2): qty 1000

## 2014-07-04 MED ORDER — DEXTROSE 50 % IV SOLN
1.0000 | Freq: Once | INTRAVENOUS | Status: AC
  Administered 2014-07-04: 50 mL via INTRAVENOUS
  Filled 2014-07-04: qty 50

## 2014-07-04 MED ORDER — FAT EMULSION 20 % IV EMUL
250.0000 mL | INTRAVENOUS | Status: AC
  Administered 2014-07-04: 250 mL via INTRAVENOUS
  Filled 2014-07-04: qty 250

## 2014-07-04 MED ORDER — DEXTROSE 50 % IV SOLN
1.0000 | Freq: Once | INTRAVENOUS | Status: AC
  Administered 2014-07-04: 50 mL via INTRAVENOUS

## 2014-07-04 MED ORDER — DEXTROSE 50 % IV SOLN
INTRAVENOUS | Status: AC
  Administered 2014-07-04: 50 mL
  Filled 2014-07-04: qty 50

## 2014-07-04 MED ORDER — TRACE MINERALS CR-CU-F-FE-I-MN-MO-SE-ZN IV SOLN
INTRAVENOUS | Status: AC
  Administered 2014-07-04: 18:00:00 via INTRAVENOUS
  Filled 2014-07-04: qty 2000

## 2014-07-04 MED ORDER — KCL IN DEXTROSE-NACL 20-5-0.45 MEQ/L-%-% IV SOLN
INTRAVENOUS | Status: DC
  Filled 2014-07-04 (×3): qty 1000

## 2014-07-04 MED ORDER — DEXTROSE 50 % IV SOLN
INTRAVENOUS | Status: AC
  Filled 2014-07-04: qty 50

## 2014-07-04 NOTE — Progress Notes (Signed)
eLink Physician-Brief Progress Note Patient Name: Katelyn Rojas DOB: [birth year] MRN: [id number]   Date of Service  07/04/2014  HPI/Events of Note  Nurse call - hypoglycemia, on TPN with insulin  eICU Interventions  Stop TPN - start D10 at same rate until new TPN bag comes.      Intervention Category Major Interventions: Acid-Base disturbance - evaluation and management Intermediate Interventions: OtherCarolan Clines. 07/04/2014, 3:38 PM

## 2014-07-04 NOTE — Progress Notes (Signed)
  Hypoglycemic Event  CBG: 40  Treatment: Dextrose 50% IV  Symptoms: Drowsy  Follow-up CBG: Time:0829 CBG Result:149  Possible Reasons for Event: Unknown  Comments/MD notified:Md aware    Audiel Scheiber, Tilford Pillar  Remember to initiate Hypoglycemia Order Set & complete

## 2014-07-04 NOTE — Progress Notes (Signed)
[facility] Progress Note  1 Day Post-Op  Subjective: Pt awake and alert, able to tell me she's not in much pain.  Nurses say she's asking for meds around the clock.  No N/V.  Pain around new peg site.  Dressing changes going well.    Objective: Vital signs in last 24 hours: Temp:  [97.7 F (36.5 C)-98.8 F (37.1 C)] 98 F (36.7 C) (08/22 0421) Pulse Rate:  [75-106] 94 (08/22 0421) Resp:  [13-26] 13 (08/22 0421) BP: (91-117)/(46-65) 103/55 mmHg (08/22 0421) SpO2:  [99 %-100 %] 100 % (08/22 0421) FiO2 (%):  [35 %] 35 % (08/22 0350) Last BM Date: 07/03/14 (colostomy)  Intake/Output from previous day: 08/21 0701 - 08/22 0700 In: 2802.1 [I.V.:1190.4; IV Piggyback:950; TPN:661.7] Out: 1025 [Urine:1025] Intake/Output this shift:    PE: Gen:  Alert, NAD, pleasant Abd: Soft, mild tenderness over new G-tube site and midline wound, ND, +BS, no HSM, midline incision intact with minor separation of fascia and slough, no visible granulation tissue, g-tube with dressing intact Ext: Edematous, contracted   Lab Results:   Recent Labs  07/03/14 0620 07/04/14 0510  WBC 10.8* 11.1*  HGB 8.3* 8.9*  HCT 25.5* 27.4*  PLT 303 325   BMET  Recent Labs  07/03/14 0620 07/04/14 0510  NA 137 138  K 4.4 3.9  CL 98 97  CO2 31 30  GLUCOSE 387* 267*  BUN 17 15  CREATININE 0.40* 0.44*  CALCIUM 7.8* 8.2*   PT/INR No results found for this basename: LABPROT, INR,  in the last 72 hours CMP     Component Value Date/Time   NA 138 07/04/2014 0510   K 3.9 07/04/2014 0510   CL 97 07/04/2014 0510   CO2 30 07/04/2014 0510   GLUCOSE 267* 07/04/2014 0510   BUN 15 07/04/2014 0510   CREATININE 0.44* 07/04/2014 0510   CALCIUM 8.2* 07/04/2014 0510   PROT 5.0* 07/04/2014 0510   ALBUMIN 2.1* 07/04/2014 0510   AST 27 07/04/2014 0510   ALT 14 07/04/2014 0510   ALKPHOS 73 07/04/2014 0510   BILITOT 0.3 07/04/2014 0510   GFRNONAA >90 07/04/2014 0510   GFRAA >90 07/04/2014 0510   Lipase      Component Value Date/Time   LIPASE 14 07/01/2014 1843       Studies/Results: No results found.  Anti-infectives: Anti-infectives   Start     Dose/Rate Route Frequency Ordered Stop   07/02/14 0800  metroNIDAZOLE (FLAGYL) IVPB 500 mg     500 mg 100 mL/hr over 60 Minutes Intravenous Every 8 hours 07/02/14 0054     07/02/14 0600  vancomycin (VANCOCIN) IVPB 1000 mg/200 mL premix     1,000 mg 200 mL/hr over 60 Minutes Intravenous Every 24 hours 07/02/14 0054     07/02/14 0200  ciprofloxacin (CIPRO) IVPB 400 mg     400 mg 200 mL/hr over 60 Minutes Intravenous Every 12 hours 07/02/14 0054         Assessment/Plan Transcolonic percutaneous gastrostomy tube POD #8 s/p segmental transverse colectomy, wedge gastrectomy, open Stamm gastrostomy, End colostomy  Now with Dislodged G-tube POD #1 s/p replacement of G tube (Dr. Elnoria HowardHung - 07/03/14)  Plan:  1. Continue wound care, add abdominal binder, no PO by mouth until at least tomorrow or Monday 2. The wound is not healing well, there is no granulation tissue, continue WD dressing changes BID to midline wound 3. Suggest TPN for now, then tube feeds at the earliest Sunday or Monday  to allow old gastrostomy tube site time to heal since Dr. Elnoria Howard needed to create another G-tube site 4. SCD's and heparin 5. Disp - back to [name] when medically stable and resumed on Tube feeds     LOS: 3 days    Aris Georgia 07/04/2014, 7:37 AM Pager: [phone]

## 2014-07-04 NOTE — Progress Notes (Signed)
Hypoglycemic Event  CBG: 26  Treatment: 50% Dextrose IV  Symptoms: Drowsy  Follow-up CBG: Time:1249 CBG Result:96  Possible Reasons for Event: Unknown  Comments/MD notified:Olivia Giddings Changed IV fluids to D10    Sarissa Dern, Tilford Pillar  Remember to initiate Hypoglycemia Order Set & complete

## 2014-07-04 NOTE — Progress Notes (Signed)
Patient is resting comfortably No sign of peritonitis  Would wait at least 48 hours before starting tube feeds.  Wilmon Arms. Corliss Skains, MD, [facility]  General/ Trauma Surgery  07/04/2014 7:49 AM

## 2014-07-04 NOTE — Progress Notes (Signed)
PARENTERAL NUTRITION CONSULT NOTE   Pharmacy Consult for TPN Indication: PEG tube malfunction   Patient Measurements: Height: 5\' 1"  (154.9 cm) Weight: 120 lb 13 oz (54.8 kg) IBW/kg (Calculated) : 47.8   Vital Signs: Temp: 97.7 F (36.5 C) (08/22 0757) Temp src: Axillary (08/22 0757) BP: 106/55 mmHg (08/22 0800) Pulse Rate: 90 (08/22 0800) Intake/Output from previous day: 08/21 0701 - 08/22 0700 In: 2802.1 [I.V.:1190.4; IV Piggyback:950; TPN:661.7] Out: 1025 [Urine:1025] Intake/Output from this shift: Total I/O In: 200 [I.V.:50; IV Piggyback:100; TPN:50] Out: -   Labs:  Recent Labs  07/02/14 0346 07/03/14 0620 07/04/14 0510  WBC 9.9 10.8* 11.1*  HGB 7.7* 8.3* 8.9*  HCT 25.3* 25.5* 27.4*  PLT 282 303 325     Recent Labs  07/01/14 1843 07/02/14 0346 07/03/14 0620 07/04/14 0510  NA 145 141 137 138  K 3.8 3.1* 4.4 3.9  CL 100 101 98 97  CO2 36* 33* 31 30  GLUCOSE 178* 87 387* 267*  BUN 27* 23 17 15   CREATININE 0.39* 0.41* 0.40* 0.44*  CALCIUM 8.6 8.4 7.8* 8.2*  MG  --   --  1.4* 2.0  PHOS  --   --   --  3.9  PROT 5.4*  --   --  5.0*  ALBUMIN 2.3*  --   --  2.1*  AST 21  --   --  27  ALT 22  --   --  14  ALKPHOS 89  --   --  73  BILITOT 0.2*  --   --  0.3  TRIG  --   --   --  332*   Estimated Creatinine Clearance: 48.7 ml/min (by C-G formula based on Cr of 0.44).    Recent Labs  07/04/14 0108 07/04/14 0423 07/04/14 0554  GLUCAP 78 45* 108*    Insulin Requirements in the past 24 hours:  3 units SSI  Current Nutrition:  NPO  Nutritional Goals:   1400-1600 kCal, 90-100 grams of protein per day (goal change per RD note 8/17)  Assessment: 73 year old female from [name]. Chronic vent/trach. D/c from [facility] 8/18 to [name], PEG noted to be dislodged 8/19. It was replaced at [name] but continued to have drainage and bubbling around tube. Brought to ED for evaluation. Marland Kitchen  GI: 8/5 - 8/18 > admission to [facility]  8/14 - To OR for segmental transverse  colectomy, wedge gastrectomy, open Stamm gastrostomy, and End colostomy  8/19 - PEG dislodged, attempted to replace at [facility]. drainage at site, to ED.  8/21 PEG replaced in ENDO by Dr Elnoria Howard. Started on TNA per Surg recs. 8/22 - no signs of peritonitis, wait at least 48 hours before starting tube feeds.  Endo: DM.  She is  on chronic steroids- currently on stress dose IV HC- 50mg  IV q6h. Had several episodes of hypoglycemia overnight.  She has SSI ordered, and also has a small amount of insulin in her TPN bag.  Lytes: K 3.9, mg 2, Phos 3.9, Na 138  Renal: Scr 0.4, UOP 0.8 mL/kg/hr   Pulm: COPD, chronic trach on vent support.  Cards: Hx HTN. BP soft-wnl, HR normal-tachy- currently on no CV meds.  Hepatobil: LFT's WNL, alb 2.3. Trigs 25 on 8/17  Neuro: Anxiety, dementia, lumbar compression fx.   ID: Vanco/Flagyl/Cipro (all since 8/4) for intraabdominal process. Afebrile. WBC 11.1  Best Practices: IV PPI, Heparin SQ  TPN Access: 8/6 double-lumen PICC line  TPN day#: 8/10 >> 8/18; resumed 8/21  Plan:  1. Increase Clinimix E 5/15 to 80 ml/hr. Continue 20% Lipid Emulsion at 10 ml/hr 2. Continue CBGs and SSI as ordered.  Remove insulin from TPN. 3. CMET with AM labs. 4. TPN labs every Monday and Thursday 5. Monitor for restarting tube feeds 6. Reduce IV fluids to KVO mL/hr when TPN increased.  Estella HuskMichelle Cyann Venti, Pharm.D., BCPS, AAHIVP Clinical Pharmacist Phone: [phone] or [phone] 07/04/2014, 8:32 AM

## 2014-07-04 NOTE — Progress Notes (Signed)
Pt. CBG of 40-50 at 0000. CBG was checked three different times with two different meters. Amp of D50 given and MD notified. Will recheck in 30 minutes.

## 2014-07-04 NOTE — Progress Notes (Signed)
PULMONARY / CRITICAL CARE MEDICINE   Name: Katelyn Rojas MRN: [id number] DOB: 10/08/1941    ADMISSION DATE:  07/01/2014  REFERRING MD :  EDP  CHIEF COMPLAINT:  PEG tube malfunction  INITIAL PRESENTATION: [age] year old female from [name]. Chronic vent/trach. [facility] to [name], PEG noted to be dislodged 8/19. It was replaced at [name] but continued to have drainage and bubbling around tube. Brought to ED for evaluation. PCCM called for admission.  STUDIES:  8/19 - XR abdomen: NG tube in stomach, air filled bowel loops. Concern for obstruction.  8/19 CT abdomen/pelvis: >>>  SIGNIFICANT EVENTS: 8/5 - 8/18 > [facility] - To OR for segmental transverse colectomy, wedge gastrectomy, open Stamm gastrostomy, and End colostomy 8/19 - PEG dislodged, attempted to replace at [facility]. drainage at site, to ED. 8/21 PEG replaced in ENDO by Dr Elnoria HowardHung. Started on TNA per Surg recs.    SUBJECTIVE:  Resting comfortably.  On full vent support. No new problems reported  VITAL SIGNS: Temp:  [97.7 F (36.5 C)-98.8 F (37.1 C)] 97.7 F (36.5 C) (08/22 0757) Pulse Rate:  [75-100] 90 (08/22 0800) Resp:  [0-26] 0 (08/22 0800) BP: (91-117)/(46-60) 106/55 mmHg (08/22 0800) SpO2:  [100 %] 100 % (08/22 0800) FiO2 (%):  [30 %-35 %] 30 % (08/22 0700) Weight:  [54.8 kg (120 lb 13 oz)] 54.8 kg (120 lb 13 oz) (08/22 0500) VENTILATOR SETTINGS: Vent Mode:  [-] PRVC FiO2 (%):  [30 %-35 %] 30 % Set Rate:  [14 bmp] 14 bmp Vt Set:  [460 mL] 460 mL PEEP:  [5 cmH20] 5 cmH20 Plateau Pressure:  [17 cmH20-26 cmH20] 24 cmH20 INTAKE / OUTPUT:  Intake/Output Summary (Last 24 hours) at 07/04/14 1053 Last data filed at 07/04/14 0805  Gross per 24 hour  Intake 2902.08 ml  Output   1025 ml  Net 1877.08 ml    PHYSICAL EXAMINATION: General:  Elderly female, on vent, in NAD, passive Neuro:  Non-verbal, awake, alert.  HEENT:  [place]/AT, PERRL. Trach in place Cardiovascular:  RRR, no MRG Lungs:   Clear anterior, synchronous with vent, breathing over set rate Abdomen:  Soft, dressing intact. PEG in place. Tender to palp  Musculoskeletal:  No acute deformity.  Skin:  Intact, +1 pitting peripheral edema.   LABS:  CBC  Recent Labs Lab 07/02/14 0346 07/03/14 0620 07/04/14 0510  WBC 9.9 10.8* 11.1*  HGB 7.7* 8.3* 8.9*  HCT 25.3* 25.5* 27.4*  PLT 282 303 325   Coag's No results found for this basename: APTT, INR,  in the last 168 hours BMET  Recent Labs Lab 07/02/14 0346 07/03/14 0620 07/04/14 0510  NA 141 137 138  K 3.1* 4.4 3.9  CL 101 98 97  CO2 33* 31 30  BUN 23 17 15   CREATININE 0.41* 0.40* 0.44*  GLUCOSE 87 387* 267*   Electrolytes  Recent Labs Lab 06/29/14 0513 06/30/14 0515  07/02/14 0346 07/03/14 0620 07/04/14 0510  CALCIUM 8.9 8.7  < > 8.4 7.8* 8.2*  MG 2.0 2.0  --   --  1.4* 2.0  PHOS 3.5 3.4  --   --   --  3.9  < > = values in this interval not displayed. Sepsis Markers  Recent Labs Lab 07/01/14 1952  LATICACIDVEN 1.54   ABG  Recent Labs Lab 07/02/14 0052  PHART 7.479*  PCO2ART 49.0*  PO2ART 76.0*   Liver Enzymes  Recent Labs Lab 06/29/14 0513 07/01/14 1843 07/04/14 0510  AST  16 21 27   ALT 13 22 14   ALKPHOS 82 89 73  BILITOT 0.3 0.2* 0.3  ALBUMIN 2.6* 2.3* 2.1*   Glucose  Recent Labs Lab 07/04/14 0007 07/04/14 0108 07/04/14 0423 07/04/14 0554 07/04/14 0752 07/04/14 0829  GLUCAP 40* 78 45* 108* 40* 140*    Imaging No results found.   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  Tracheostomy status with respirator dependence. Status unchanged P:   Pressure support wean as tolerated F/u CXR, ABG as needed  PRN albuterol  CARDIOVASCULAR Rt PICC 8/6 >>>  A:  Chronic Hypotension - SBP baseline 80's P:  Monitor hemodynamics  RENAL A:   Hypokalemia. P:   F/u and replace electrolytes as needed Monitor renal fx, urine  outpt  GASTROINTESTINAL A:   PEG malfunctioning ?SBO Severe protein calorie malnutrition. P:   FOR PEG placement by GI 8/21 Start TNA (per surg recs), hopefully 8/24. Appreciate SGY following. protonix for SUP  HEMATOLOGIC A:   Anemia of chronic disease. P:  Follow CBC VTE ppx - Heparin  INFECTIOUS A:   Hx MRSA and enterococcus bacteremia. Hx Tracheitis (cultures + for Alcaligenes bacteria).  Sacral decubitus ulcer. P:   UC 8/19 >>> Continue pre-admission cipro, flagyl, vancomycin. Change to IV  ENDOCRINE A:   DM type II. Relative AI. P:   SSI Stress dose steriods  NEUROLOGIC A:   Dementia. Anxiety. P:   RASS goal: 0 PRN fentanyl for RASS goal and pain.  Resume her aricept as soon as able   Goals of Care >> Family remains optimistic that she will improve/recover and feel she was making good progress with care at [name].  They would like her to return to [name] once her current acute medical/surgical issues are addressed.     Waymon Budge, MD PCCM M [phone]   p [phone]

## 2014-07-05 DIAGNOSIS — E46 Unspecified protein-calorie malnutrition: Secondary | ICD-10-CM

## 2014-07-05 DIAGNOSIS — R1084 Generalized abdominal pain: Secondary | ICD-10-CM

## 2014-07-05 LAB — COMPREHENSIVE METABOLIC PANEL
ALT: 12 U/L (ref 0–35)
ANION GAP: 8 (ref 5–15)
AST: 19 U/L (ref 0–37)
Albumin: 2 g/dL — ABNORMAL LOW (ref 3.5–5.2)
Alkaline Phosphatase: 71 U/L (ref 39–117)
BUN: 21 mg/dL (ref 6–23)
CO2: 31 mEq/L (ref 19–32)
Calcium: 8 mg/dL — ABNORMAL LOW (ref 8.4–10.5)
Chloride: 94 mEq/L — ABNORMAL LOW (ref 96–112)
Creatinine, Ser: 0.52 mg/dL (ref 0.50–1.10)
GFR calc Af Amer: >90 mL/min (ref >90)
GFR calc non Af Amer: >90 mL/min (ref >90)
GLUCOSE: 627 mg/dL — AB (ref 70–99)
Potassium: 4.5 mEq/L (ref 3.7–5.3)
Sodium: 133 mEq/L — ABNORMAL LOW (ref 137–147)
TOTAL PROTEIN: 4.9 g/dL — AB (ref 6.0–8.3)
Total Bilirubin: 0.2 mg/dL — ABNORMAL LOW (ref 0.3–1.2)

## 2014-07-05 LAB — GLUCOSE, CAPILLARY
Glucose-Capillary: 156 mg/dL — ABNORMAL HIGH (ref 70–99)
Glucose-Capillary: 164 mg/dL — ABNORMAL HIGH (ref 70–99)
Glucose-Capillary: 164 mg/dL — ABNORMAL HIGH (ref 70–99)
Glucose-Capillary: 192 mg/dL — ABNORMAL HIGH (ref 70–99)
Glucose-Capillary: 227 mg/dL — ABNORMAL HIGH (ref 70–99)
Glucose-Capillary: 271 mg/dL — ABNORMAL HIGH (ref 70–99)

## 2014-07-05 LAB — VANCOMYCIN, TROUGH: VANCOMYCIN TR: 16.4 ug/mL (ref 10.0–20.0)

## 2014-07-05 MED ORDER — TRACE MINERALS CR-CU-F-FE-I-MN-MO-SE-ZN IV SOLN
INTRAVENOUS | Status: AC
  Administered 2014-07-05: 18:00:00 via INTRAVENOUS
  Filled 2014-07-05: qty 2000

## 2014-07-05 MED ORDER — CETYLPYRIDINIUM CHLORIDE 0.05 % MT LIQD
7.0000 mL | Freq: Four times a day (QID) | OROMUCOSAL | Status: DC
  Administered 2014-07-05 – 2014-07-23 (×68): 7 mL via OROMUCOSAL

## 2014-07-05 MED ORDER — CHLORHEXIDINE GLUCONATE 0.12 % MT SOLN
15.0000 mL | Freq: Two times a day (BID) | OROMUCOSAL | Status: DC
  Administered 2014-07-05 – 2014-07-23 (×34): 15 mL via OROMUCOSAL
  Filled 2014-07-05 (×38): qty 15

## 2014-07-05 MED ORDER — ONDANSETRON HCL 4 MG/2ML IJ SOLN
4.0000 mg | Freq: Three times a day (TID) | INTRAMUSCULAR | Status: DC | PRN
  Administered 2014-07-05 – 2014-07-10 (×4): 4 mg via INTRAVENOUS
  Filled 2014-07-05 (×4): qty 2

## 2014-07-05 MED ORDER — FUROSEMIDE 10 MG/ML IJ SOLN
40.0000 mg | Freq: Once | INTRAMUSCULAR | Status: AC
  Administered 2014-07-05: 40 mg via INTRAVENOUS
  Filled 2014-07-05: qty 4

## 2014-07-05 MED ORDER — FAT EMULSION 20 % IV EMUL
250.0000 mL | INTRAVENOUS | Status: AC
  Administered 2014-07-05: 250 mL via INTRAVENOUS
  Filled 2014-07-05: qty 250

## 2014-07-05 NOTE — Progress Notes (Signed)
Spoke with Ladona Mow [facility]) about the wound care order, and she said pt is on their list and someone will do the wound care tomorrow. Aris Georgia, PA has been paged; awaiting a call back.

## 2014-07-05 NOTE — Progress Notes (Signed)
PARENTERAL NUTRITION CONSULT NOTE   Pharmacy Consult for TPN Indication: PEG tube malfunction   Patient Measurements: Height:  (154.9 cm) Weight: 120 lb 13 oz (54.8 kg) IBW/kg (Calculated) : 47.8   Vital Signs: Temp: 98 F (36.7 C) (08/23 0809) Temp src: Oral (08/23 0809) BP: 100/48 mmHg (08/23 0809) Pulse Rate: 102 (08/23 0809) Intake/Output from previous day: 08/22 0701 - 08/23 0700 In: 2176 [I.V.:520.8; IV Piggyback:600; TPN:1055.2] Out: 300 [Urine:200; Stool:100] Intake/Output from this shift:    Labs:  Recent Labs  07/03/14 0620 07/04/14 0510  WBC 10.8* 11.1*  HGB 8.3* 8.9*  HCT 25.5* 27.4*  PLT 303 325     Recent Labs  07/03/14 0620 07/04/14 0510 07/05/14 0655  NA 137 138 133*  K 4.4 3.9 4.5  CL 98 97 94*  CO2 GLUCOSE 387* 267* 627*  BUN CREATININE 0.40* 0.44* 0.52  CALCIUM 7.8* 8.2* 8.0*  MG 1.4* 2.0  --   PHOS  --  3.9  --   PROT  --  5.0* 4.9*  ALBUMIN  --  2.1* 2.0*  AST  --  27 19  ALT  --  14 12  ALKPHOS  --  73 71  BILITOT  --  0.3 0.2*  PREALBUMIN  --  24.2  --   TRIG  --  332*  --    Estimated Creatinine Clearance: 48.7 ml/min (by C-G formula based on Cr of 0.52).    Recent Labs  07/05/14 0032 07/05/14 0437 07/05/14 0809  GLUCAP 271* 156* 164*    Insulin Requirements in the past 24 hours:  9 units SSI  Current Nutrition:  NPO Clinimix E 5/15 at 80 ml/hr + 20% lipid emulsion at 10 ml/hr provides 1603 kCal and 96 g protein per day to meet her estimated nutritional needs.  Nutritional Goals:   1400-1600 kCal, 90-100 grams of protein per day (goal change per RD note 8/17)  Assessment: 73 year old female from [name]. Chronic vent/trach. D/c from [facility] 8/18 to [name], PEG noted to be dislodged 8/19. It was replaced at [name] but continued to have drainage and bubbling around tube. Brought to ED for evaluation. Marland Kitchen  GI: 8/5 - 8/18 > admission to [facility]  8/14 - To OR for segmental transverse colectomy,  wedge gastrectomy, open Stamm gastrostomy, and End colostomy  8/19 - PEG dislodged, attempted to replace at [facility]. drainage at site, to ED.  8/21 PEG replaced in ENDO by Dr Elnoria Howard. Started on TNA per Surg recs. 8/22 - no signs of peritonitis, wait at least 48 hours before starting tube feeds. 8/23: wound dehiscence, concerning that fistulas may develop.  No tube feeds until 8/24 at the earliest.  She is at goal rate on her TPN.  Endo: DM.  She is  on chronic steroids- currently on stress dose IV HC-  IV q6h. Had several episodes of hypoglycemia 8/23, which eventually required discontinuation of her TPN that contained insulin.  Her CBGs are now 190-271-156-154 once her new TPN bag was started that does not contain insulin.  Will target CBGs <180 for her.  Note glucose of 627 was obtained from the line where her TPN is running and represents a contaminated specimen.  Lytes: K 4.5, last Mag 2, last Phos 3.9, Na 133  Renal: Scr 0.5, UOP 0.2 mL/kg/hr - unable to determine if documentation of urine output is complete  Pulm: COPD, chronic trach on vent support.  Cards: Hx HTN.  BP soft-wnl, HR normal-tachy- currently on no CV meds.  Hepatobil: LFT's WNL, alb 2. Trigs 25 on 8/17  Neuro: Anxiety, dementia, lumbar compression fx.   ID: Vanco/Flagyl/Cipro (all since at least 8/4) for intraabdominal process. Afebrile. WBC 11.1  Best Practices: IV PPI, Heparin SQ  TPN Access: 8/6 double-lumen PICC line  TPN day#: 8/10 >> 8/18; resumed 8/21  Plan:  1. Continue Clinimix E 5/15 at 80 ml/hr. Continue 20% Lipid Emulsion at 10 ml/hr 2. Continue CBGs and SSI as ordered.  Continue with no insulin in TPN. 3. TPN labs every Monday and Thursday 4. Monitor for restarting tube feeds  Estella Husk, Pharm.D., BCPS, AAHIVP Clinical Pharmacist Phone: [phone] or [phone] 07/05/2014, 8:29 AM

## 2014-07-05 NOTE — Progress Notes (Signed)
PULMONARY / CRITICAL CARE MEDICINE   Name: Katelyn Rojas MRN: [id number] DOB: 06-Apr-1941    ADMISSION DATE:  07/01/2014  REFERRING MD :  EDP  CHIEF COMPLAINT:  PEG tube malfunction  INITIAL PRESENTATION: 73 year old female from [name]. Chronic vent/trach. D/c from [facility] 8/18 to [name], PEG noted to be dislodged 8/19. It was replaced at [name] but continued to have drainage and bubbling around tube. Brought to ED for evaluation. PCCM called for admission.  STUDIES:  8/19 - XR abdomen: NG tube in stomach, air filled bowel loops. Concern for obstruction.  8/19 CT abdomen/pelvis: >>>  SIGNIFICANT EVENTS: 8/5 - 8/18 > admission to [facility] 8/14 - To OR for segmental transverse colectomy, wedge gastrectomy, open Stamm gastrostomy, and End colostomy 8/19 - PEG dislodged, attempted to replace at [facility]. drainage at site, to ED. 8/21 PEG replaced in ENDO by Dr Elnoria Howard. Started on TNA per Surg recs.    SUBJECTIVE:  Full vent support. Surgery reports wound dehiscence. Patient reports pain.Hypoglycemia when TPN interrupted.  VITAL SIGNS: Temp:  [88 F (31.1 C)-99.6 F (37.6 C)] 98 F (36.7 C) (08/23 0809) Pulse Rate:  [84-113] 102 (08/23 0809) Resp:  [13-27] 27 (08/23 0809) BP: (91-133)/(48-72) 100/48 mmHg (08/23 0809) SpO2:  [96 %-100 %] 99 % (08/23 0809) FiO2 (%):  [30 %] 30 % (08/23 0809) VENTILATOR SETTINGS: Vent Mode:  [-] PRVC FiO2 (%):  [30 %] 30 % Set Rate:  [14 bmp] 14 bmp Vt Set:  [460 mL] 460 mL PEEP:  [5 cmH20] 5 cmH20 Plateau Pressure:  [18 cmH20-25 cmH20] 25 cmH20 INTAKE / OUTPUT:  Intake/Output Summary (Last 24 hours) at 07/05/14 1035 Last data filed at 07/05/14 1029  Gross per 24 hour  Intake 1963.5 ml  Output   1050 ml  Net  913.5 ml    PHYSICAL EXAMINATION: General:  Elderly female, on vent, in NAD, passive. Just finished bath. Neuro:  Non-verbal, awake, alert. Nods indicating abdominal pain. HEENT:  [place]/AT, PERRL. Trach in place Cardiovascular:  RRR, no  MRG Lungs:  Clear anterior, synchronous with vent, breathing over set rate Abdomen:  Soft, dressing intact. PEG in place. Tender to palp  Musculoskeletal:  No acute deformity.  Skin:  Intact, +1 pitting peripheral edema.   LABS:  CBC  Recent Labs Lab 07/02/14 0346 07/03/14 0620 07/04/14 0510  WBC 9.9 10.8* 11.1*  HGB 7.7* 8.3* 8.9*  HCT 25.3* 25.5* 27.4*  PLT 282 303 325   Coag's No results found for this basename: APTT, INR,  in the last 168 hours BMET  Recent Labs Lab 07/03/14 0620 07/04/14 0510 07/05/14 0655  NA 137 138 133*  K 4.4 3.9 4.5  CL 98 97 94*  CO2 31 30 31   BUN 17 15 21   CREATININE 0.40* 0.44* 0.52  GLUCOSE 387* 267* 627*   Electrolytes  Recent Labs Lab 06/29/14 0513 06/30/14 0515  07/03/14 0620 07/04/14 0510 07/05/14 0655  CALCIUM 8.9 8.7  < > 7.8* 8.2* 8.0*  MG 2.0 2.0  --  1.4* 2.0  --   PHOS 3.5 3.4  --   --  3.9  --   < > = values in this interval not displayed. Sepsis Markers  Recent Labs Lab 07/01/14 1952  LATICACIDVEN 1.54   ABG  Recent Labs Lab 07/02/14 0052  PHART 7.479*  PCO2ART 49.0*  PO2ART 76.0*   Liver Enzymes  Recent Labs Lab 07/01/14 1843 07/04/14 0510 07/05/14 0655  AST 21 27 19   ALT 22 14 12  ALKPHOS 89 73 71  BILITOT 0.2* 0.3 0.2*  ALBUMIN 2.3* 2.1* 2.0*   Glucose  Recent Labs Lab 07/04/14 1652 07/04/14 1745 07/04/14 1940 07/05/14 0032 07/05/14 0437 07/05/14 0809  GLUCAP 28* 130* 190* 271* 156* 164*    Imaging No results found.   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  Tracheostomy status with respirator dependence. Status unchanged P:   Pressure support wean as tolerated, but not tolerating currently F/u CXR, ABG as needed  PRN albuterol  CARDIOVASCULAR Rt PICC 8/6 >>>  A:  Chronic Hypotension - SBP baseline 80's P:  Monitor hemodynamics  RENAL A:   Hypokalemia. P:   F/u and replace electrolytes as  needed Monitor renal fx, urine outpt  GASTROINTESTINAL A:   PEG malfunctioning ?SBO Severe protein calorie malnutrition. Wound dehiscence p new PEG, being managed by sgy P:    PEG placement by GI 8/21 Start TNA (per surg recs), when cleared by surg. Appreciate SGY following. protonix for SUP  HEMATOLOGIC A:   Anemia of chronic disease. P:  Follow CBC VTE ppx - Heparin  INFECTIOUS A:   Hx MRSA and enterococcus bacteremia. Hx Tracheitis (cultures + for Alcaligenes bacteria).  Sacral decubitus ulcer. P:   UC 8/19 >>> Continue pre-admission cipro, flagyl, vancomycin. Change to IV  ENDOCRINE A:   DM type II. Relative AI. P:   SSI Stress dose steriods  NEUROLOGIC A:   Dementia. Anxiety. P:   RASS goal: 0 PRN fentanyl for RASS goal and pain.  Resume her aricept as soon as able   Goals of Care >> Family remains optimistic that she will improve/recover and feel she was making good progress with care at [name].  They would like her to return to [name] once her current acute medical/surgical issues are addressed.     Waymon Budge, MD PCCM M [phone]   p [phone]

## 2014-07-05 NOTE — Progress Notes (Signed)
ANTIBIOTIC CONSULT NOTE -  Pharmacy Consult for vancomycin Indication: intra-abdominal infection/ hx MRSA and enterococcus bacteremia  Allergies  Allergen Reactions  . Namenda [Memantine Hcl] Other (See Comments)    "hallucinations"   . Codeine Other (See Comments)    Unknown. Listed on [facility]  . Penicillins Other (See Comments)    Unknown; listed on [facility]    Patient Measurements: Height: 5\' 1"  (154.9 cm) Weight: 120 lb 13 oz (54.8 kg) IBW/kg (Calculated) : 47.8   Vital Signs: Temp: 98.2 F (36.8 C) (08/23 0438) Temp src: Axillary (08/23 0438) BP: 126/48 mmHg (08/23 0732) Pulse Rate: 103 (08/23 0732) Intake/Output from previous day: 08/22 0701 - 08/23 0700 In: 2176 [I.V.:520.8; IV Piggyback:600; TPN:1055.2] Out: 300 [Urine:200; Stool:100] Intake/Output from this shift:    Labs:  Recent Labs  07/03/14 0620 07/04/14 0510  WBC 10.8* 11.1*  HGB 8.3* 8.9*  PLT 303 325  CREATININE 0.40* 0.44*   Estimated Creatinine Clearance: 48.7 ml/min (by C-G formula based on Cr of 0.44).  Recent Labs  07/05/14 0655  VANCOTROUGH 16.4     Microbiology: Recent Results (from the past 720 hour(s))  MRSA PCR SCREENING     Status: Abnormal   Collection Time    06/17/14  6:28 PM      Result Value Ref Range Status   MRSA by PCR POSITIVE (*) NEGATIVE Final   Comment:            The GeneXpert MRSA Assay (FDA     approved for NASAL specimens     only), is one component of a     comprehensive MRSA colonization     surveillance program. It is not     intended to diagnose MRSA     infection nor to guide or     monitor treatment for     MRSA infections.     RESULT CALLED TO, READ BACK BY AND VERIFIED WITH:     A BECK RN 2031 06/17/14 A BROWNING  SURGICAL PCR SCREEN     Status: Abnormal   Collection Time    06/26/14  8:20 AM      Result Value Ref Range Status   MRSA, PCR POSITIVE (*) NEGATIVE Final   Staphylococcus aureus POSITIVE (*) NEGATIVE Final    Comment:            The Xpert SA Assay (FDA     approved for NASAL specimens     in patients over 37 years of age),     is one component of     a comprehensive surveillance     program.  Test performance has     been validated by [employer] for patients greater     than or equal to 81 year old.     It is not intended     to diagnose infection nor to     guide or monitor treatment.  URINE CULTURE     Status: None   Collection Time    07/01/14  9:15 PM      Result Value Ref Range Status   Specimen Description URINE, CATHETERIZED   Final   Special Requests NONE   Final   Culture  Setup Time     Final   Value: 07/02/2014 09:02     Performed at [facility]   Colony Count     Final   Value: >=100,000 COLONIES/ML     Performed at  [facility]     Final   Value: YEAST     Performed at [facility]   Report Status 07/03/2014 FINAL   Final   Assessment: 73 year old female with h/o MRSA bacteremia for vancomycin  Goal of Therapy:  Vancomycin trough level 15-20 mcg/ml  Plan:  Continue Vancomycin 1 g IV q24h  Geannie Risen, PharmD, BCPS

## 2014-07-05 NOTE — Progress Notes (Signed)
[facility] Progress Note  2 Days Post-Op  Subjective: Pt c/o abdominal pain, nausea, and a headache.  Wound is dehisced with bowel visible and peritoneal fluid leaking out.  Glucose is extrememly high after her hypoglycemic episodes yesterday.  She is emotional crying as we discuss what's going on with her wound.    Objective: Vital signs in last 24 hours: Temp:  [97.1 F (36.2 C)-99.6 F (37.6 C)] 98.2 F (36.8 C) (08/23 0438) Pulse Rate:  [74-113] 103 (08/23 0732) Resp:  [9-24] 23 (08/23 0732) BP: (91-133)/(48-72) 126/48 mmHg (08/23 0732) SpO2:  [96 %-100 %] 98 % (08/23 0732) FiO2 (%):  [30 %] 30 % (08/23 0732) Last BM Date: 07/03/14 (colostomy)  Intake/Output from previous day: 08/22 0701 - 08/23 0700 In: 2176 [I.V.:520.8; IV Piggyback:600; TPN:1055.2] Out: 300 [Urine:200; Stool:100] Intake/Output this shift:    PE: Gen:  Alert, NAD, pleasant Abd: Soft, moderate tenderness over new G-tube site and midline wound, ND, +BS, no HSM, midline incision dehisced with evidence of evisceration of the wound as I can see bowel under a very thin layer of granulation tissue,  Skin around the incision is edematous and draining clear peritoneal fluid from the wound, new g-tube with dressing intact  Ext: Edematous, contracted   Lab Results:   Recent Labs  07/03/14 0620 07/04/14 0510  WBC 10.8* 11.1*  HGB 8.3* 8.9*  HCT 25.5* 27.4*  PLT 303 325   BMET  Recent Labs  07/04/14 0510 07/05/14 0655  NA 138 133*  K 3.9 4.5  CL 97 94*  CO2 30 31  GLUCOSE 267* 627*  BUN 15 21  CREATININE 0.44* 0.52  CALCIUM 8.2* 8.0*   PT/INR No results found for this basename: LABPROT, INR,  in the last 72 hours CMP     Component Value Date/Time   NA 133* 07/05/2014 0655   K 4.5 07/05/2014 0655   CL 94* 07/05/2014 0655   CO2 31 07/05/2014 0655   GLUCOSE 627* 07/05/2014 0655   BUN 21 07/05/2014 0655   CREATININE 0.52 07/05/2014 0655   CALCIUM 8.0* 07/05/2014 0655   PROT 4.9*  07/05/2014 0655   ALBUMIN 2.0* 07/05/2014 0655   AST 19 07/05/2014 0655   ALT 12 07/05/2014 0655   ALKPHOS 71 07/05/2014 0655   BILITOT 0.2* 07/05/2014 0655   GFRNONAA >90 07/05/2014 0655   GFRAA >90 07/05/2014 0655   Lipase     Component Value Date/Time   LIPASE 14 07/01/2014 1843       Studies/Results: No results found.  Anti-infectives: Anti-infectives   Start     Dose/Rate Route Frequency Ordered Stop   07/02/14 0800  metroNIDAZOLE (FLAGYL) IVPB 500 mg     500 mg 100 mL/hr over 60 Minutes Intravenous Every 8 hours 07/02/14 0054     07/02/14 0600  vancomycin (VANCOCIN) IVPB 1000 mg/200 mL premix     1,000 mg 200 mL/hr over 60 Minutes Intravenous Every 24 hours 07/02/14 0054     07/02/14 0200  ciprofloxacin (CIPRO) IVPB 400 mg     400 mg 200 mL/hr over 60 Minutes Intravenous Every 12 hours 07/02/14 0054         Assessment/Plan Transcolonic percutaneous gastrostomy tube POD #9 s/p segmental transverse colectomy, wedge gastrectomy, open Stamm gastrostomy, End colostomy  Now with Dislodged G-tube POD #2 s/p replacement of G tube (Dr. Elnoria Howard - 07/03/14)  Wound dehiscence with bowel visible just under the minimal layer of granulation tissue  PCM on TPN  Plan:  1. Continue wound care, abdominal binder, no PO (tube feeds) by mouth until at Monday if bowel function has improved 2. The wound is not healing well which is not unexpected given her PCM, the wound has dehisced and I can see bowel she is not fully eviscerated.  Peritoneal fluid is leaking out of the wound.  Will ask WOC to place a large Eakins pouch with red rubber catheter to suction.  Concern will be if EA fistulas develop.  Ordered CBC. 3. Suggest TPN for now, then tube feeds likely to start Monday to allow old gastrostomy tube site time to heal since Dr. Elnoria Howard needed to create another G-tube site.  Ostomy working.   4. SCD's and heparin  5. Disp - [name] when medically stable, but unsure at this point on timing due to new  development of wound dehiscence 6. Attempted to call the Jacinta Shoe to give him and update because no family at bedside, but the phone number is not going through at this time.      LOS: 4 days    Aris Georgia 07/05/2014, 8:06 AM Pager: [phone]

## 2014-07-05 NOTE — Progress Notes (Signed)
Verbal order was given this AM by the PA (surgery) not to change pt's abdominal wound dressing and to wait for the WOC-RN to place a large Eakin pouch. WOC-RN has been contacted but she will not be able place the [employer] until tomorrow. Paged the PA numerous times to see what to do in the meantime, but I did not get a call back.

## 2014-07-05 NOTE — Progress Notes (Signed)
eLink Physician-Brief Progress Note Patient Name: Katelyn Rojas DOB: 12-Aug-1941 MRN: [id number]   Date of Service  07/05/2014  HPI/Events of Note  Ana sarca, on TNA  eICU Interventions  Lasix 40 x 1     Intervention Category Intermediate Interventions: Other:  ALVA,RAKESH V. 07/05/2014, 4:02 PM

## 2014-07-05 NOTE — Progress Notes (Signed)
Wound has dehisced but bowel seems stuck like a "bowel brain". If it is not free then we won't be able to safely reclose wound in OR. Will cover with eakin's pouch and monitor

## 2014-07-06 ENCOUNTER — Encounter (HOSPITAL_COMMUNITY): Payer: Self-pay | Admitting: Gastroenterology

## 2014-07-06 DIAGNOSIS — J411 Mucopurulent chronic bronchitis: Secondary | ICD-10-CM

## 2014-07-06 DIAGNOSIS — K631 Perforation of intestine (nontraumatic): Secondary | ICD-10-CM

## 2014-07-06 LAB — COMPREHENSIVE METABOLIC PANEL WITH GFR
ALT: 11 U/L (ref 0–35)
AST: 14 U/L (ref 0–37)
Albumin: 2.1 g/dL — ABNORMAL LOW (ref 3.5–5.2)
Alkaline Phosphatase: 70 U/L (ref 39–117)
Anion gap: 8 (ref 5–15)
BUN: 26 mg/dL — ABNORMAL HIGH (ref 6–23)
CO2: 35 meq/L — ABNORMAL HIGH (ref 19–32)
Calcium: 7.8 mg/dL — ABNORMAL LOW (ref 8.4–10.5)
Chloride: 96 meq/L (ref 96–112)
Creatinine, Ser: 0.47 mg/dL — ABNORMAL LOW (ref 0.50–1.10)
GFR calc Af Amer: >90 mL/min
GFR calc non Af Amer: >90 mL/min
Glucose, Bld: 128 mg/dL — ABNORMAL HIGH (ref 70–99)
Potassium: 3 meq/L — ABNORMAL LOW (ref 3.7–5.3)
Sodium: 139 meq/L (ref 137–147)
Total Bilirubin: 0.3 mg/dL (ref 0.3–1.2)
Total Protein: 4.8 g/dL — ABNORMAL LOW (ref 6.0–8.3)

## 2014-07-06 LAB — MAGNESIUM: Magnesium: 1.6 mg/dL (ref 1.5–2.5)

## 2014-07-06 LAB — DIFFERENTIAL
BASOS PCT: 0 % (ref 0–1)
Basophils Absolute: 0 10*3/uL (ref 0.0–0.1)
EOS ABS: 0 10*3/uL (ref 0.0–0.7)
Eosinophils Relative: 0 % (ref 0–5)
Lymphocytes Relative: 2 % — ABNORMAL LOW (ref 12–46)
Lymphs Abs: 0.2 10*3/uL — ABNORMAL LOW (ref 0.7–4.0)
MONO ABS: 0.5 10*3/uL (ref 0.1–1.0)
MONOS PCT: 5 % (ref 3–12)
NEUTROS ABS: 9.7 10*3/uL — AB (ref 1.7–7.7)
Neutrophils Relative %: 93 % — ABNORMAL HIGH (ref 43–77)

## 2014-07-06 LAB — CBC
HCT: 24.8 % — ABNORMAL LOW (ref 36.0–46.0)
Hemoglobin: 8.1 g/dL — ABNORMAL LOW (ref 12.0–15.0)
MCH: 31.2 pg (ref 26.0–34.0)
MCHC: 32.7 g/dL (ref 30.0–36.0)
MCV: 95.4 fL (ref 78.0–100.0)
Platelets: 274 10*3/uL (ref 150–400)
RBC: 2.6 MIL/uL — ABNORMAL LOW (ref 3.87–5.11)
RDW: 14.5 % (ref 11.5–15.5)
WBC: 10.4 10*3/uL (ref 4.0–10.5)

## 2014-07-06 LAB — GLUCOSE, CAPILLARY
GLUCOSE-CAPILLARY: 135 mg/dL — AB (ref 70–99)
GLUCOSE-CAPILLARY: 166 mg/dL — AB (ref 70–99)
GLUCOSE-CAPILLARY: 176 mg/dL — AB (ref 70–99)
Glucose-Capillary: 222 mg/dL — ABNORMAL HIGH (ref 70–99)
Glucose-Capillary: 184 mg/dL — ABNORMAL HIGH (ref 70–99)
Glucose-Capillary: 157 mg/dL — ABNORMAL HIGH (ref 70–99)

## 2014-07-06 LAB — PREALBUMIN: Prealbumin: 23.3 mg/dL (ref 17.0–34.0)

## 2014-07-06 LAB — TRIGLYCERIDES: TRIGLYCERIDES: 38 mg/dL (ref <150)

## 2014-07-06 LAB — PHOSPHORUS: Phosphorus: 2.8 mg/dL (ref 2.3–4.6)

## 2014-07-06 MED ORDER — POTASSIUM CHLORIDE 10 MEQ/50ML IV SOLN
10.0000 meq | INTRAVENOUS | Status: AC
  Administered 2014-07-06 (×4): 10 meq via INTRAVENOUS
  Filled 2014-07-06 (×5): qty 50

## 2014-07-06 MED ORDER — TRACE MINERALS CR-CU-F-FE-I-MN-MO-SE-ZN IV SOLN
INTRAVENOUS | Status: AC
  Administered 2014-07-06: 18:00:00 via INTRAVENOUS
  Filled 2014-07-06: qty 2000

## 2014-07-06 MED ORDER — MAGNESIUM SULFATE 40 MG/ML IJ SOLN
2.0000 g | Freq: Once | INTRAMUSCULAR | Status: AC
  Administered 2014-07-06: 2 g via INTRAVENOUS
  Filled 2014-07-06: qty 50

## 2014-07-06 MED ORDER — FAT EMULSION 20 % IV EMUL
250.0000 mL | INTRAVENOUS | Status: AC
  Administered 2014-07-06: 250 mL via INTRAVENOUS
  Filled 2014-07-06: qty 250

## 2014-07-06 MED ORDER — POTASSIUM CHLORIDE 10 MEQ/50ML IV SOLN
10.0000 meq | Freq: Once | INTRAVENOUS | Status: AC
  Administered 2014-07-06: 10 meq via INTRAVENOUS

## 2014-07-06 MED ORDER — POTASSIUM CHLORIDE 10 MEQ/50ML IV SOLN
INTRAVENOUS | Status: AC
  Administered 2014-07-06: 10 meq
  Filled 2014-07-06: qty 50

## 2014-07-06 MED ORDER — FLUCONAZOLE IN SODIUM CHLORIDE 200-0.9 MG/100ML-% IV SOLN
200.0000 mg | Freq: Once | INTRAVENOUS | Status: AC
  Administered 2014-07-06: 200 mg via INTRAVENOUS
  Filled 2014-07-06: qty 100

## 2014-07-06 MED ORDER — VITAL HIGH PROTEIN PO LIQD: 1000.0000 mL | ORAL | Status: DC

## 2014-07-06 MED ORDER — FLUCONAZOLE 100MG IVPB
100.0000 mg | INTRAVENOUS | Status: DC
  Administered 2014-07-07 – 2014-07-18 (×12): 100 mg via INTRAVENOUS
  Filled 2014-07-06 (×15): qty 50

## 2014-07-06 MED ORDER — PRO-STAT SUGAR FREE PO LIQD
30.0000 mL | Freq: Four times a day (QID) | ORAL | Status: DC
  Administered 2014-07-06 (×3): 30 mL
  Filled 2014-07-06 (×8): qty 30

## 2014-07-06 MED ORDER — OSMOLITE 1.2 CAL PO LIQD
1000.0000 mL | ORAL | Status: DC
  Administered 2014-07-06: 1000 mL
  Filled 2014-07-06 (×3): qty 1000

## 2014-07-06 NOTE — Progress Notes (Signed)
PULMONARY / CRITICAL CARE MEDICINE   Name: Katelyn Rojas MRN: [id number] DOB: 10/18/1941    ADMISSION DATE:  07/01/2014  REFERRING MD :  EDP  CHIEF COMPLAINT:  PEG tube malfunction  INITIAL PRESENTATION: 73 year old female from [name]. Chronic vent/trach. D/c from [facility] 8/18 to [name], PEG noted to be dislodged 8/19. It was replaced at [name] but continued to have drainage and bubbling around tube. Brought to ED for evaluation. PCCM called for admission.  STUDIES:  8/19 - XR abdomen: NG tube in stomach, air filled bowel loops. Concern for obstruction.  8/19 CT abdomen/pelvis: >>>  SIGNIFICANT EVENTS: 8/5 - 8/18 > admission to [facility] 8/14 - To OR for segmental transverse colectomy, wedge gastrectomy, open Stamm gastrostomy, and End colostomy 8/19 - PEG dislodged, attempted to replace at [facility]. drainage at site, to ED. 8/21 PEG replaced in ENDO by Dr Elnoria Howard. Started on TNA per Surg recs.  8/23: wound dehisces   SUBJECTIVE:  Full vent support. Surgery reports wound dehiscence. Patient reports pain.not weaning  VITAL SIGNS: Temp:  [96.8 F (36 C)-98.7 F (37.1 C)] 97.3 F (36.3 C) (08/24 1201) Pulse Rate:  [84-102] 98 (08/24 1201) Resp:  [14-21] 21 (08/24 1201) BP: (91-117)/(48-57) 117/51 mmHg (08/24 1201) SpO2:  [97 %-100 %] 97 % (08/24 1201) FiO2 (%):  [30 %] 30 % (08/24 1201) Weight:  [57 kg (125 lb 10.6 oz)] 57 kg (125 lb 10.6 oz) (08/24 1201) VENTILATOR SETTINGS: Vent Mode:  [-] PSV;CPAP FiO2 (%):  [30 %] 30 % Set Rate:  [14 bmp] 14 bmp Vt Set:  [450 mL-460 mL] 460 mL PEEP:  [5 cmH20] 5 cmH20 Pressure Support:  [12 cmH20] 12 cmH20 Plateau Pressure:  [19 cmH20-24 cmH20] 22 cmH20 INTAKE / OUTPUT:  Intake/Output Summary (Last 24 hours) at 07/06/14 1338 Last data filed at 07/06/14 1213  Gross per 24 hour  Intake 1398.16 ml  Output   3700 ml  Net -2301.84 ml    PHYSICAL EXAMINATION: General:  Elderly female, on vent, in NAD, passive. Just finished bath. Neuro:   Non-verbal, awake, alert. Nods indicating abdominal pain. HEENT:  [place]/AT, PERRL. Trach in place Cardiovascular:  RRR, no MRG Lungs:  Clear anterior, synchronous with vent, breathing over set rate, occ rhonchi  Abdomen:  Soft, dressing intact. PEG in place. Tender to palp Dressing intact  Musculoskeletal:  No acute deformity.  Skin:  Intact, +3 pitting peripheral edema. Diffuse anasarca   LABS:  CBC  Recent Labs Lab 07/03/14 0620 07/04/14 0510 07/06/14 0500  WBC 10.8* 11.1* 10.4  HGB 8.3* 8.9* 8.1*  HCT 25.5* 27.4* 24.8*  PLT 303 325 274   Coag's No results found for this basename: APTT, INR,  in the last 168 hours BMET  Recent Labs Lab 07/04/14 0510 07/05/14 0655 07/06/14 0500  NA 138 133* 139  K 3.9 4.5 3.0*  CL 97 94* 96  CO2 30 31 35*  BUN 15 21 26*  CREATININE 0.44* 0.52 0.47*  GLUCOSE 267* 627* 128*   Electrolytes  Recent Labs Lab 06/30/14 0515  07/03/14 0620 07/04/14 0510 07/05/14 0655 07/06/14 0500  CALCIUM 8.7  < > 7.8* 8.2* 8.0* 7.8*  MG 2.0  --  1.4* 2.0  --  1.6  PHOS 3.4  --   --  3.9  --  2.8  < > = values in this interval not displayed. Sepsis Markers  Recent Labs Lab 07/01/14 1952  LATICACIDVEN 1.54   ABG  Recent Labs Lab 07/02/14 0052  PHART  7.479*  PCO2ART 49.0*  PO2ART 76.0*   Liver Enzymes  Recent Labs Lab 07/04/14 0510 07/05/14 0655 07/06/14 0500  AST ALT ALKPHOS 73 71 70  BILITOT 0.3 0.2* 0.3  ALBUMIN 2.1* 2.0* 2.1*   Glucose  Recent Labs Lab 07/05/14 1618 07/05/14 2020 07/06/14 0047 07/06/14 0541 07/06/14 0803 07/06/14 1213  GLUCAP 227* 164* 176* 222* 135* 166*    Imaging No results found.   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  Tracheostomy status with respirator dependence. Status unchanged P:   Pressure support wean as tolerated F/u CXR, ABG as needed  PRN albuterol  CARDIOVASCULAR Rt PICC 8/6 >>>  A:   Chronic Hypotension - SBP baseline 80's P:  Monitor hemodynamics  RENAL A:   Hypokalemia. P:   F/u and replace electrolytes as needed (ordered)  Monitor renal fx, urine outpt  GASTROINTESTINAL A:   PEG malposition s/p replacement by GI 8/21 ?SBO Severe protein calorie malnutrition. Wound dehiscence p new PEG, being managed by sgy P:   Cont TNA (per surg recs) protonix for SUP Very slow ramp up on tubes feeds   HEMATOLOGIC A:   Anemia of chronic disease. P:  Follow CBC VTE ppx - Heparin  INFECTIOUS A:   Hx MRSA and enterococcus bacteremia. Hx Tracheitis (cultures + for Alcaligenes bacteria).  Sacral decubitus ulcer. P:   UC 8/19: > 100 K yeast  BC x 2 8/24>>> Continue pre-admission cipro, flagyl, vancomycin. Changed to IV cipro 8/5>>> vanc 8/5>>> Flagyl 8/5>>> Diflucan 8/24>>> Repeat BC.. Will d/c vanc if neg   ENDOCRINE A:   DM type II. Relative AI. P:   SSI Stress dose steriods  NEUROLOGIC A:   Dementia. Anxiety. P:   RASS goal: 0 PRN fentanyl for RASS goal and pain.  Resume her aricept as soon as able   Goals of Care >> no improvement. On TNA, starting trickle feeds. Poor prognostics for long term improvement   Simonne Martinet, NP   Attending:  I have seen and examined the patient with nurse practitioner/resident and agree with the note above.   She is not making progress and will not do well with continued aggressive care I spoke with 2 of the 3 main decision makers at the bedside today (Brother Peyton Najjar and Jacinta Shoe) and requested a family meeting on 8/26 10AM to discuss goals of care.  Heber [place], MD [place] P[facility]M Pager: [phone] Cell: [phone] If no response, call [phone]

## 2014-07-06 NOTE — Progress Notes (Addendum)
PARENTERAL NUTRITION CONSULT NOTE   Pharmacy Consult for TPN Indication: PEG tube malfunction   Patient Measurements: Height: 5\' 1"  (154.9 cm) Weight: 121 lb 4.1 oz (55 kg) IBW/kg (Calculated) : 47.8   Vital Signs: Temp: 97.9 F (36.6 C) (08/24 0806) Temp src: Axillary (08/24 0806) BP: 91/48 mmHg (08/24 0806) Pulse Rate: 84 (08/24 0806) Intake/Output from previous day: 08/23 0701 - 08/24 0700 In: 1998.2 [IV Piggyback:600; KOE:6950.7] Out: 4025 [Urine:4025] Intake/Output from this shift: Total I/O In: -  Out: 250 [Urine:250]  Labs:  Recent Labs  07/04/14 0510 07/06/14 0500  WBC 11.1* 10.4  HGB 8.9* 8.1*  HCT 27.4* 24.8*  PLT 325 274     Recent Labs  07/04/14 0510 07/05/14 0655 07/06/14 0500  NA 138 133* 139  K 3.9 4.5 3.0*  CL 97 94* 96  CO2 30 31 35*  GLUCOSE 267* 627* 128*  BUN 15 21 26*  CREATININE 0.44* 0.52 0.47*  CALCIUM 8.2* 8.0* 7.8*  MG 2.0  --  1.6  PHOS 3.9  --  2.8  PROT 5.0* 4.9* 4.8*  ALBUMIN 2.1* 2.0* 2.1*  AST 27 19 14   ALT 14 12 11   ALKPHOS 73 71 70  BILITOT 0.3 0.2* 0.3  PREALBUMIN 24.2  --   --   TRIG 332*  --  38   Estimated Creatinine Clearance: 48.7 ml/min (by C-G formula based on Cr of 0.47).    Recent Labs  07/06/14 0047 07/06/14 0541 07/06/14 0803  GLUCAP 176* 222* 135*    Insulin Requirements in the past 24 hours:  12 units SSI  Current Nutrition:  NPO Clinimix E 5/15 at 80 ml/hr + 20% lipid emulsion at 10 ml/hr provides 1843 kCal and 96 g protein per day to meet her estimated nutritional needs.  Nutritional Goals:   1400-1600 kCal, 90-100 grams of protein per day (goal change per RD note 8/17)  Assessment: 73 year old female from [name]. Chronic vent/trach. D/c from [facility] 8/18 to [name], PEG noted to be dislodged 8/19. It was replaced at [name] but continued to have drainage and bubbling around tube. Brought to ED for evaluation. Marland Kitchen  GI: 8/5 - 8/18 > admission to [facility]  8/14 - To OR for segmental transverse  colectomy, wedge gastrectomy, open Stamm gastrostomy, and End colostomy  8/19 - PEG dislodged, attempted to replace at [facility]. drainage at site, to ED.  8/21 PEG replaced in ENDO by Dr Elnoria Howard. Started on TNA per Surg recs. 8/22 - no signs of peritonitis, wait at least 48 hours before starting tube feeds. 8/23: wound dehiscence, concerning that fistulas may develop.  No tube feeds until 8/24 at the earliest.  She is at goal rate on her TPN. 8/24: plan to start TF at low rate and increase slowly.   Endo: DM.  She is  on chronic steroids- currently on stress dose IV HC- 50mg  IV q6h. Had several episodes of hypoglycemia 8/23, which eventually required discontinuation of her TPN that contained insulin.  Her CBGs are now 135-222 once her new TPN bag was started that does not contain insulin.  Will target CBGs <180 for her.   Lytes: K 3.0, Mag 1.6, Phos 2.8  Renal: Scr stable, UOP good (lasix)  Pulm: COPD, chronic trach on vent support.  Cards: Hx HTN. BP soft-wnl, HR normal-tachy- currently on no CV meds.  Hepatobil: LFT's WNL, alb 2.1. Trigs 38  Neuro: Anxiety, dementia, lumbar compression fx.   ID: Vanco/Flagyl/Cipro (all since at least 8/4) for  intraabdominal process. Afebrile. WBC wnl  Best Practices: IV PPI, Heparin SQ  TPN Access: 8/6 double-lumen PICC line  TPN day#: 8/10 >> 8/18; resumed 8/21  Plan:  1. Continue Clinimix E 5/15 at 80 ml/hr. Change 20% Lipid Emulsion at 10 ml/hr to Monday, Wed, FridaY- This will provide daily avg 1568 calories and 96 gm of protein.  2. Continue CBGs and SSI as ordered.  Continue with no insulin in TPN. 3. Replace KCl 10 mEq qhr x6  4. Replace Mg 2 gm IV.  5. Check BMET, Mg in AM. 6. Monitor for tube feeds tolerance and ability to ween/dc TPN  Jannet Askew, PharmD Candidate    Christoper Fabian, PharmD, BCPS Clinical pharmacist, pager [phone] 07/07/2014  7:29 AM

## 2014-07-06 NOTE — Progress Notes (Signed)
[facility] Progress Note  3 Days Post-Op  Subjective: Pt c/o pain over abdomen and nausea.  Less alert today, won't open her eyes.  Appears  sleepy.    Objective: Vital signs in last 24 hours: Temp:  [96.8 F (36 C)-98.7 F (37.1 C)] 98.7 F (37.1 C) (08/23 2021) Pulse Rate:  [91-102] 102 (08/23 2021) Resp:  [14-27] 20 (08/23 2021) BP: (100-118)/(40-57) 104/49 mmHg (08/23 2021) SpO2:  [99 %-100 %] 100 % (08/23 2021) FiO2 (%):  [30 %] 30 % (08/24 0415) Weight:  [121 lb 4.1 oz (55 kg)] 121 lb 4.1 oz (55 kg) (08/23 1203) Last BM Date: 07/03/14 (colostomy)  Intake/Output from previous day: 08/23 0701 - 08/24 0700 In: 1998.2 [IV Piggyback:600; JYN:8295.6] Out: 4025 [Urine:4025] Intake/Output this shift:    PE: Gen: Less alert today than yesterday, NAD, sleepy Abd: Soft, mild tenderness over new G-tube site and midline wound, mild distension, +BS, no HSM, midline incision dehisced with evidence of evisceration of the wound as I can see bowel under a very thin layer of granulation tissue, Skin around the incision is edematous and draining clear peritoneal fluid from the wound, new g-tube with dressing intact Ext:  Edematous and contractured UE  Lab Results:   Recent Labs  07/04/14 0510 07/06/14 0500  WBC 11.1* 10.4  HGB 8.9* 8.1*  HCT 27.4* 24.8*  PLT 325 274   BMET  Recent Labs  07/04/14 0510 07/05/14 0655  NA 138 133*  K 3.9 4.5  CL 97 94*  CO2 30 31  GLUCOSE 267* 627*  BUN 15 21  CREATININE 0.44* 0.52  CALCIUM 8.2* 8.0*   PT/INR No results found for this basename: LABPROT, INR,  in the last 72 hours CMP     Component Value Date/Time   NA 133* 07/05/2014 0655   K 4.5 07/05/2014 0655   CL 94* 07/05/2014 0655   CO2 31 07/05/2014 0655   GLUCOSE 627* 07/05/2014 0655   BUN 21 07/05/2014 0655   CREATININE 0.52 07/05/2014 0655   CALCIUM 8.0* 07/05/2014 0655   PROT 4.9* 07/05/2014 0655   ALBUMIN 2.0* 07/05/2014 0655   AST 19 07/05/2014 0655   ALT 12  07/05/2014 0655   ALKPHOS 71 07/05/2014 0655   BILITOT 0.2* 07/05/2014 0655   GFRNONAA >90 07/05/2014 0655   GFRAA >90 07/05/2014 0655   Lipase     Component Value Date/Time   LIPASE 14 07/01/2014 1843       Studies/Results: No results found.  Anti-infectives: Anti-infectives   Start     Dose/Rate Route Frequency Ordered Stop   07/02/14 0800  metroNIDAZOLE (FLAGYL) IVPB 500 mg     500 mg 100 mL/hr over 60 Minutes Intravenous Every 8 hours 07/02/14 0054     07/02/14 0600  vancomycin (VANCOCIN) IVPB 1000 mg/200 mL premix     1,000 mg 200 mL/hr over 60 Minutes Intravenous Every 24 hours 07/02/14 0054     07/02/14 0200  ciprofloxacin (CIPRO) IVPB 400 mg     400 mg 200 mL/hr over 60 Minutes Intravenous Every 12 hours 07/02/14 0054         Assessment/Plan Transcolonic percutaneous gastrostomy tube POD #10 s/p segmental transverse colectomy, wedge gastrectomy, open Stamm gastrostomy, End colostomy  Now with Dislodged G-tube POD #3 s/p replacement of G tube (Dr. Elnoria Howard - 07/03/14)  Wound dehiscence with bowel visible just under the minimal layer of granulation tissue  PCM on TPN, now starting TF  Plan:  1. Continue wound  care, abdominal binder  2. The wound has dehisced which is not unexpected due to her PCM, and I can see bowel she is not fully eviscerated.  Wound is the same as yesterday.  Peritoneal fluid is leaking out of the wound. WOC nurses were unavailable to place Bradford pouch yesterday, but are coming to place it today.   3. Suggest TPN for now, Resume TF today and then once tolerating can wean TPN.  Recommend slow TF ramp-up so we can closely monitor residuals and ensure she is tolerating.  Hopefully this nutrition will help her to heal the wound.  Ostomy working.  4. SCD's and heparin  5. Disp - [name] when medically stable, but unsure at this point on timing due to new development of wound dehiscence  6. No family at bedside     LOS: 5 days    [place], [facility] 07/06/2014,  8:03 AM Pager: [phone]

## 2014-07-06 NOTE — Progress Notes (Addendum)
NUTRITION CONSULT/FOLLOW UP  DOCUMENTATION CODES Per approved criteria  -Severe malnutrition in the context of chronic illness   INTERVENTION:59  Initiate Osmolite 1.2 formula at 10 ml/hr and increase by 10 ml every 8 hours to goal rate of 30 ml/hr with Prostat liquid protein 30 ml QID via tube to provide 1264 kcals, 100 gm protein, 590 ml of free water RD to follow for nutrition care plan  NUTRITION DIAGNOSIS: Inadequate oral intake related to inability to eat as evidenced by NPO status, ongoing  Goal: Pt to meet >/= 90% of their estimated nutrition needs, met  Monitor:  TF regimen & tolerance, respiratory status, weight, labs, I/O's   ASSESSMENT: 73 year old female from [name]. Chronic vent/trach. D/c from [facility] 8/18 to [name], PEG noted to be dislodged 8/19. It was replaced at [name] but continued to have drainage and bubbling around tube. Brought to ED for evaluation. PCCM called for admission.   8/17:  [name] usual TF regimen is Isosource 1.5: 300 ml every 6 hours with free water flushes 30 ml/h to provide 1800 kcals, 82 gm protein, 1637 ml free water daily.  Patient s/p procedure 8/21: EGD WITH PEG TUBE PLACEMENT  Per GI, unable to use PEG for next 48 hours.  RD consulted for TPN initiation.  Patient is receiving TPN with Clinimix E 5/15 @ 40 ml/hr and lipids @ 10 ml/hr. Provides 1162 kcal and 48 grams protein per day. Meets 83% minimum estimated energy needs and 53% minimum estimated protein needs.  8/24:  PEG tube ready to be used.  Surgery note reviewed.  Pt c/o pain over abdomen & nausea.  RD consulted for TF initiation & management.  Will advance slowly.  Patient is currently on ventilator support -- trach MV: 5.6 L/min Temp (24hrs), Avg:97.9 F (36.6 C), Min:96.8 F (36 C), Max:98.7 F (37.1 C)   Patient is receiving TPN with Clinimix E 5/15 @ 80 ml/hr and lipids @ 10 ml/hr. Provides kcal 1843 and 96 grams protein per day. Meets 153% minimum estimated energy  needs and 100% minimum estimated protein needs.  Height: Ht Readings from Last 1 Encounters:  07/02/14 5' 1"  (1.549 m)    Weight: Wt Readings from Last 1 Encounters:  07/05/14 121 lb 4.1 oz (55 kg)    BMI:  Body mass index is 22.92 kg/(m^2).  Re-estimated Nutritional Needs: Kcal: 1200-1400 Protein: 90-100 gm Fluid: >/= 1.5 L1  Skin: Stage IV sacral ulcer, abdominal full thickness wound with exposed bowel & drainage  Diet Order: NPO   Intake/Output Summary (Last 24 hours) at 07/06/14 0950 Last data filed at 07/06/14 0925  Gross per 24 hour  Intake 1598.16 ml  Output   4275 ml  Net -2676.84 ml    Labs:   Recent Labs Lab 06/30/14 0515  07/03/14 0620 07/04/14 0510 07/05/14 0655 07/06/14 0500  NA 143  < > 137 138 133* 139  K 3.8  < > 4.4 3.9 4.5 3.0*  CL 100  < > 98 97 94* 96  CO2 35*  < > 31 30 31  35*  BUN 39*  < > 17 15 21  26*  CREATININE 0.45*  < > 0.40* 0.44* 0.52 0.47*  CALCIUM 8.7  < > 7.8* 8.2* 8.0* 7.8*  MG 2.0  --  1.4* 2.0  --  1.6  PHOS 3.4  --   --  3.9  --  2.8  GLUCOSE 143*  < > 387* 267* 627* 128*  < > = values in this  interval not displayed.  CBG (last 3)   Recent Labs  07/06/14 0047 07/06/14 0541 07/06/14 0803  GLUCAP 176* 222* 135*    Scheduled Meds: . antiseptic oral rinse  7 mL Mouth Rinse QID  . chlorhexidine  15 mL Mouth Rinse BID  . ciprofloxacin  400 mg Intravenous Q12H  . heparin  5,000 Units Subcutaneous 3 times per day  . hydrocortisone sodium succinate  50 mg Intravenous Q6H  . insulin aspart  0-9 Units Subcutaneous 6 times per day  . metronidazole  500 mg Intravenous Q8H  . pantoprazole (PROTONIX) IV  40 mg Intravenous QHS  . vancomycin  1,000 mg Intravenous Q24H    Continuous Infusions: . sodium chloride Stopped (07/02/14 2128)  . dextrose 5 % and 0.45 % NaCl with KCl 20 mEq/L    . Marland KitchenTPN (CLINIMIX-E) Adult 80 mL/hr at 07/05/14 1749   And  . fat emulsion 250 mL (07/05/14 1749)    Past Medical History   Diagnosis Date  . Hypertension   . 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 Arthritis   . Anxiety   . Cachexia 12/2013  . DM type 2 (diabetes mellitus, type 2)   . 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 Anemia 12/2013  . Dementia   . Compression fracture of lumbar vertebra, non-traumatic 12/2013    L3 and L1.     Past Surgical History  Procedure Laterality Date  . Vaginal hysterectomy      ? Abdominal vs. Vaginal  . Laparotomy N/A 06/26/2014    Procedure: EXPLORATORY LAPAROTOMY;  Surgeon: Rolm Bookbinder, MD;  Location: [place];  Service: General;  Laterality: N/A;  . Partial colectomy N/A 06/26/2014    Procedure: TRANSVERSE COLECTOMY/COLOSTOMY;  Surgeon: Rolm Bookbinder, MD;  Location: [place];  Service: General;  Laterality: N/A;  . Gastrostomy N/A 06/26/2014    Procedure: NEW GASTROSTOMY TUBE;  Surgeon: Rolm Bookbinder, MD;  Location: [place];  Service: General;  Laterality: N/A;  . Peg placement N/A 07/03/2014    Procedure: PERCUTANEOUS ENDOSCOPIC GASTROSTOMY (PEG) PLACEMENT;  Surgeon: Beryle Beams, MD;  Location: [place];  Service: Endoscopy;  Laterality: N/A;    Arthur Holms, RD, LDN Pager #: [phone] After-Hours Pager #: [phone]

## 2014-07-06 NOTE — Consult Note (Addendum)
WOC wound consult note Reason for Consult:abdominal wound with exposed bowel and drainage Wound type:surgical post-op full thickness Measurement: above umbilicus: 9.5cmx6.5cmx0.1cm; below umbilicus:2.0cmx2.0cmx 0.1cm Wound QKM:MNOTR umbilicus 80% pink with 20% yellow slough;below umbilicus-90% pink with 10% yellow slough Drainage: no drainage or odor Periwound:skin intact Dressing procedure/placement/frequency:above umbilicus: medium Eakin poach applied; below umbilicus: Aquacell applied to wound bed with foam dressing applied for protection, bag left to gravity as no drainage visualized, no suction applied. Can be added later if drainage increases.  WOC ostomy consult note Stoma type/location: right upper abdominal quadrant from 8/17 colostomy surgery Stomal assessment/size: translucent pink possibly indicative of swelling. 1 3/4 inches, red and viable, above skin level. Peristomal assessment: skin intact Treatment options for stomal/peristomal skin: change bag every three days to visualize skin and make necessary interventions Output: 10cc brown liquid, no stool or flatus at this time. Ostomy pouching: 1pc. Ostomy bag applied. Education provided: Patient not ready for education at this time. Obtunded and on vent. Education materials at bedside, no family at bedside. Will need total assistance with pouch application and emptying.  No family present in room. Supplies ordered to bedside for staff nurses. Charlesetta Garibaldi, RN, BSN, MSN-student Cammie Mcgee MSN, RN, [place], [place], [place] [phone]

## 2014-07-06 NOTE — Progress Notes (Signed)
Bowel fixed to abdominal wall, will try Eakin's pouch for now. Start TF. Patient examined and I agree with the assessment and plan  Violeta Gelinas, MD, MPH, FACS Trauma: [phone] General Surgery: [phone]  07/06/2014 12:46 PM

## 2014-07-07 DIAGNOSIS — J438 Other emphysema: Secondary | ICD-10-CM

## 2014-07-07 LAB — BASIC METABOLIC PANEL
ANION GAP: 8 (ref 5–15)
BUN: 30 mg/dL — ABNORMAL HIGH (ref 6–23)
CALCIUM: 8.3 mg/dL — AB (ref 8.4–10.5)
CO2: 33 mEq/L — ABNORMAL HIGH (ref 19–32)
Chloride: 97 mEq/L (ref 96–112)
Creatinine, Ser: 0.44 mg/dL — ABNORMAL LOW (ref 0.50–1.10)
GFR calc non Af Amer: >90 mL/min (ref >90)
Glucose, Bld: 160 mg/dL — ABNORMAL HIGH (ref 70–99)
Potassium: 3.9 mEq/L (ref 3.7–5.3)
SODIUM: 138 meq/L (ref 137–147)

## 2014-07-07 LAB — GLUCOSE, CAPILLARY
GLUCOSE-CAPILLARY: 204 mg/dL — AB (ref 70–99)
GLUCOSE-CAPILLARY: 213 mg/dL — AB (ref 70–99)
Glucose-Capillary: 156 mg/dL — ABNORMAL HIGH (ref 70–99)
Glucose-Capillary: 199 mg/dL — ABNORMAL HIGH (ref 70–99)
Glucose-Capillary: 198 mg/dL — ABNORMAL HIGH (ref 70–99)
Glucose-Capillary: 165 mg/dL — ABNORMAL HIGH (ref 70–99)

## 2014-07-07 LAB — MAGNESIUM: MAGNESIUM: 2.2 mg/dL (ref 1.5–2.5)

## 2014-07-07 MED ORDER — FENTANYL CITRATE 0.05 MG/ML IJ SOLN
100.0000 ug | INTRAMUSCULAR | Status: DC | PRN
  Administered 2014-07-07 (×4): 100 ug via INTRAVENOUS
  Administered 2014-07-08: 50 ug via INTRAVENOUS
  Administered 2014-07-08 (×4): 100 ug via INTRAVENOUS
  Administered 2014-07-08: 50 ug via INTRAVENOUS
  Administered 2014-07-08 – 2014-07-09 (×3): 100 ug via INTRAVENOUS
  Filled 2014-07-07 (×13): qty 2

## 2014-07-07 MED ORDER — TRIMETHOBENZAMIDE HCL 100 MG/ML IM SOLN
200.0000 mg | Freq: Once | INTRAMUSCULAR | Status: AC
  Administered 2014-07-07: 200 mg via INTRAMUSCULAR
  Filled 2014-07-07: qty 2

## 2014-07-07 MED ORDER — TRACE MINERALS CR-CU-F-FE-I-MN-MO-SE-ZN IV SOLN
INTRAVENOUS | Status: AC
  Administered 2014-07-07: 18:00:00 via INTRAVENOUS
  Filled 2014-07-07: qty 2000

## 2014-07-07 NOTE — Progress Notes (Signed)
PULMONARY / CRITICAL CARE MEDICINE   Name: Katelyn Rojas MRN: [id number] DOB: 1941-08-16    ADMISSION DATE:  07/01/2014  REFERRING MD :  EDP  CHIEF COMPLAINT:  PEG tube malfunction  INITIAL PRESENTATION: 73 year old female from [name]. Chronic vent/trach. D/c from [facility] 8/18 to [name], PEG noted to be dislodged 8/19. It was replaced at [name] but continued to have drainage and bubbling around tube. Brought to ED for evaluation. PCCM called for admission.  STUDIES:  8/19 - XR abdomen: NG tube in stomach, air filled bowel loops. Concern for obstruction.  8/19 CT abdomen/pelvis: >>>  SIGNIFICANT EVENTS: 8/5 - 8/18 > admission to [facility] 8/14 - To OR for segmental transverse colectomy, wedge gastrectomy, open Stamm gastrostomy, and End colostomy 8/19 - PEG dislodged, attempted to replace at [facility]. drainage at site, to ED. 8/21 PEG replaced in ENDO by Dr Elnoria Howard. Started on TNA per Surg recs.  8/23: wound dehisces  8/25: vomited. No bowel sounds. Tubefeeds stopped.   SUBJECTIVE:  Not weaning. Vomited last pm. Pain worse  VITAL SIGNS: Temp:  [96.6 F (35.9 C)-97.5 F (36.4 C)] 97.5 F (36.4 C) (08/25 1131) Pulse Rate:  [94-119] 107 (08/25 1136) Resp:  [14-24] 18 (08/25 1136) BP: (105-134)/(62-81) 130/67 mmHg (08/25 1136) SpO2:  [92 %-100 %] 93 % (08/25 1136) FiO2 (%):  [30 %] 30 % (08/25 1136) Weight:  [57 kg (125 lb 10.6 oz)] 57 kg (125 lb 10.6 oz) (08/25 0400) VENTILATOR SETTINGS: Vent Mode:  [-] PRVC FiO2 (%):  [30 %] 30 % Set Rate:  [14 bmp] 14 bmp Vt Set:  [460 mL] 460 mL PEEP:  [5 cmH20] 5 cmH20 Pressure Support:  [12 cmH20] 12 cmH20 Plateau Pressure:  [21 cmH20-26 cmH20] 21 cmH20 INTAKE / OUTPUT:  Intake/Output Summary (Last 24 hours) at 07/07/14 1210 Last data filed at 07/07/14 1132  Gross per 24 hour  Intake 5152.33 ml  Output   1150 ml  Net 4002.33 ml    PHYSICAL EXAMINATION: General:  Elderly female, on vent, c/o "more pain" Neuro:  Non-verbal, awake,  alert. HEENT:  [place]/AT, PERRL. Trach in place Cardiovascular:  RRR, no MRG Lungs:  Clear anterior, synchronous with vent, breathing over set rate, occ rhonchi, and wheeze   Abdomen:  distended, dressing intact. PEG in place. Increased pain to palp over entire abd. No bowel sounds   Musculoskeletal:  No acute deformity.  Skin:  Intact, +3 pitting peripheral edema. Diffuse anasarca   LABS:  CBC  Recent Labs Lab 07/03/14 0620 07/04/14 0510 07/06/14 0500  WBC 10.8* 11.1* 10.4  HGB 8.3* 8.9* 8.1*  HCT 25.5* 27.4* 24.8*  PLT 303 325 274   Coag's No results found for this basename: APTT, INR,  in the last 168 hours BMET  Recent Labs Lab 07/05/14 0655 07/06/14 0500 07/07/14 0500  NA 133* 139 138  K 4.5 3.0* 3.9  CL 94* 96 97  CO2 31 35* 33*  BUN 21 26* 30*  CREATININE 0.52 0.47* 0.44*  GLUCOSE 627* 128* 160*   Electrolytes  Recent Labs Lab 07/04/14 0510 07/05/14 0655 07/06/14 0500 07/07/14 0500  CALCIUM 8.2* 8.0* 7.8* 8.3*  MG 2.0  --  1.6 2.2  PHOS 3.9  --  2.8  --    Sepsis Markers  Recent Labs Lab 07/01/14 1952  LATICACIDVEN 1.54   ABG  Recent Labs Lab 07/02/14 0052  PHART 7.479*  PCO2ART 49.0*  PO2ART 76.0*   Liver Enzymes  Recent Labs Lab 07/04/14 0510 07/05/14  8366 07/06/14 0500  AST 27 19 14   ALT 14 12 11   ALKPHOS 73 71 70  BILITOT 0.3 0.2* 0.3  ALBUMIN 2.1* 2.0* 2.1*   Glucose  Recent Labs Lab 07/06/14 1213 07/06/14 1657 07/06/14 2032 07/07/14 0057 07/07/14 0346 07/07/14 0745  GLUCAP 166* 184* 157* 156* 199* 204*    Imaging No results found.   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  Tracheostomy status with respirator dependence. Status unchanged P:   Pressure support wean as tolerated F/u CXR, ABG as needed  PRN albuterol  CARDIOVASCULAR Rt PICC 8/6 >>>  A:  Chronic Hypotension - SBP baseline 80's P:  Monitor hemodynamics  RENAL A:    Hypokalemia. P:   F/u and replace electrolytes as needed (ordered)  Monitor renal fx, urine outpt  GASTROINTESTINAL A:   PEG malposition s/p replacement by GI 8/21 ?SBO Severe protein calorie malnutrition. Wound dehiscence p new PEG, being managed by sgy I[place] 8/25 P:   Cont TNA (per surg recs) protonix for SUP G tube to gravity, hold tubefeeds.   HEMATOLOGIC A:   Anemia of chronic disease. P:  Follow CBC VTE ppx - Heparin  INFECTIOUS A:   Hx MRSA and enterococcus bacteremia. Hx Tracheitis (cultures + for Alcaligenes bacteria).  Sacral decubitus ulcer. P:   UC 8/19: > 100 K yeast  BC x 2 8/24>>> Continue pre-admission cipro, flagyl, vancomycin. Changed to IV cipro 8/5>>> vanc 8/5>>> Flagyl 8/5>>> Diflucan 8/24>>> Repeat BC.. Will d/c vanc if neg   ENDOCRINE A:   DM type II. Relative AI. P:   SSI Stress dose steriods  NEUROLOGIC A:   Dementia. Anxiety. P:   RASS goal: 0 PRN fentanyl for RASS goal and pain.  Resume her aricept as soon as able   Goals of Care >> no improvement. On TNA, starting trickle feeds. Poor prognostics for long term improvement   Simonne Martinet, NP  Attending:  I have seen and examined the patient with nurse practitioner/resident and agree with the note above.   Plan family conversation tomorrow  Dismal prognosis  Heber [place], MD [place] [facility] Pager: [phone] Cell: [phone] If no response, call [phone]

## 2014-07-07 NOTE — Consult Note (Signed)
WOC follow-up: Eakin pouch intact with good seal over abd full thickness wound with some dehiscence. Mod amt yellowish thick drainage in spout portion of bag which can be emptied PRN. Lower abd wound with Aquacel and foam dressing intact without leakage. No need for suction to maintain seal at this time. Ostomy pouch with good seal and no stool at this time.  Abd binder in place.  CCS following for plan of care.  Supplies at bedside if leakage occurs for bedside nurses' use. Cammie Mcgee MSN, RN, CWOCN, [place], CNS [phone]

## 2014-07-07 NOTE — Progress Notes (Signed)
[facility] Progress Note  4 Days Post-Op  Subjective: Pt c/o pain all over but significant pain/bloating in abdomen.  She had some vomiting.    Objective: Vital signs in last 24 hours: Temp:  [96.6 F (35.9 C)-97.9 F (36.6 C)] 97.2 F (36.2 C) (08/25 0400) Pulse Rate:  [84-112] 102 (08/25 0400) Resp:  [13-24] 15 (08/25 0400) BP: (91-134)/(48-81) 134/65 mmHg (08/25 0400) SpO2:  [92 %-100 %] 100 % (08/25 0400) FiO2 (%):  [30 %] 30 % (08/25 0400) Weight:  [125 lb 10.6 oz (57 kg)] 125 lb 10.6 oz (57 kg) (08/25 0400) Last BM Date: 07/06/14  Intake/Output from previous day: 08/24 0701 - 08/25 0700 In: 5152.3 [I.V.:780; NG/GT:353.8; IV Piggyback:550; TPN:3328.5] Out: 1175 [Urine:1175] Intake/Output this shift:    PE: Gen: More alert today than yesterday, complains of pain every where Abd: Soft, c/o exquisite tenderness over entire abdomen, quite distended, No BS heard, no HSM, midline incision dehisced with evidence of evisceration of the wound as I can see bowel under a very thin layer of granulation tissue, draining clear peritoneal fluid from the wound (no fistulas noted), G-tube site C/D with dressingintact Ext: Edematous and contractured UE   Lab Results:   Recent Labs  07/06/14 0500  WBC 10.4  HGB 8.1*  HCT 24.8*  PLT 274   BMET  Recent Labs  07/06/14 0500 07/07/14 0500  NA 139 138  K 3.0* 3.9  CL 96 97  CO2 35* 33*  GLUCOSE 128* 160*  BUN 26* 30*  CREATININE 0.47* 0.44*  CALCIUM 7.8* 8.3*   PT/INR No results found for this basename: LABPROT, INR,  in the last 72 hours CMP     Component Value Date/Time   NA 138 07/07/2014 0500   K 3.9 07/07/2014 0500   CL 97 07/07/2014 0500   CO2 33* 07/07/2014 0500   GLUCOSE 160* 07/07/2014 0500   BUN 30* 07/07/2014 0500   CREATININE 0.44* 07/07/2014 0500   CALCIUM 8.3* 07/07/2014 0500   PROT 4.8* 07/06/2014 0500   ALBUMIN 2.1* 07/06/2014 0500   AST 14 07/06/2014 0500   ALT 11 07/06/2014 0500   ALKPHOS 70  07/06/2014 0500   BILITOT 0.3 07/06/2014 0500   GFRNONAA >90 07/07/2014 0500   GFRAA >90 07/07/2014 0500   Lipase     Component Value Date/Time   LIPASE 14 07/01/2014 1843       Studies/Results: No results found.  Anti-infectives: Anti-infectives   Start     Dose/Rate Route Frequency Ordered Stop   07/07/14 1500  fluconazole (DIFLUCAN) IVPB 100 mg     100 mg 50 mL/hr over 60 Minutes Intravenous Every 24 hours 07/06/14 1414     07/06/14 1500  fluconazole (DIFLUCAN) IVPB 200 mg     200 mg 100 mL/hr over 60 Minutes Intravenous  Once 07/06/14 1414 07/06/14 1749   07/02/14 0800  metroNIDAZOLE (FLAGYL) IVPB 500 mg     500 mg 100 mL/hr over 60 Minutes Intravenous Every 8 hours 07/02/14 0054     07/02/14 0600  vancomycin (VANCOCIN) IVPB 1000 mg/200 mL premix     1,000 mg 200 mL/hr over 60 Minutes Intravenous Every 24 hours 07/02/14 0054     07/02/14 0200  ciprofloxacin (CIPRO) IVPB 400 mg     400 mg 200 mL/hr over 60 Minutes Intravenous Every 12 hours 07/02/14 0054         Assessment/Plan Transcolonic percutaneous gastrostomy tube POD #11 s/p segmental transverse colectomy, wedge gastrectomy, open Stamm  gastrostomy, End colostomy  Now with Dislodged G-tube POD #4 s/p replacement of G tube (Dr. Elnoria Howard - 07/03/14)  Wound dehiscence with bowel visible just under the minimal layer of granulation tissue  PCM on TPN, now starting TF   Plan:  1. Continue wound care, abdominal binder  2. The wound has dehisced which is not unexpected due to her PCM, and I can see bowel she is not fully eviscerated. Wound is the same as yesterday. Peritoneal fluid is leaking out of the wound. WOC nurses placed Eakins pouch yesterday, not currently to suction. 3. Was planning on weaning TPN and continuing TF's at 71mL/hr, but it appears the TF have been discontinued due to nausea/vomiting.  Will hold these until bowel function returns.  Put PEG to gravity to help with decompression 4. SCD's and heparin  5.  Disp - [name] when medically stable, but unsure at this point on timing due to new development of wound dehiscence  6.  No family at bedside     LOS: 6 days    Katelyn Rojas 07/07/2014, 7:27 AM Pager: [phone]

## 2014-07-07 NOTE — Progress Notes (Signed)
PARENTERAL NUTRITION CONSULT NOTE   Pharmacy Consult for TPN Indication: PEG tube malfunction; Intolerance to TF   Patient Measurements: Height:  (154.9 cm) Weight: 125 lb 10.6 oz (57 kg) IBW/kg (Calculated) : 47.8   Vital Signs: Temp: 97.2 F (36.2 C) (08/25 0400) Temp src: Axillary (08/25 0400) BP: 134/65 mmHg (08/25 0400) Pulse Rate: 102 (08/25 0400) Intake/Output from previous day: 08/24 0701 - 08/25 0700 In: 5152.3 [I.V.:780; NG/GT:353.8; IV Piggyback:550; TPN:3328.5] Out: 1175 [Urine:1175] Intake/Output from this shift:    Labs:  Recent Labs  07/06/14 0500  WBC 10.4  HGB 8.1*  HCT 24.8*  PLT 274     Recent Labs  07/05/14 0655 07/06/14 0500 07/07/14 0500  NA 133* 139 138  K 4.5 3.0* 3.9  CL 94* 96 97  CO2 31 35* 33*  GLUCOSE 627* 128* 160*  BUN 21 26* 30*  CREATININE 0.52 0.47* 0.44*  CALCIUM 8.0* 7.8* 8.3*  MG  --  1.6 2.2  PHOS  --  2.8  --   PROT 4.9* 4.8*  --   ALBUMIN 2.0* 2.1*  --   AST 19 14  --   ALT 12 11  --   ALKPHOS 71 70  --   BILITOT 0.2* 0.3  --   PREALBUMIN  --  23.3  --   TRIG  --  38  --    Estimated Creatinine Clearance: 48.7 ml/min (by C-G formula based on Cr of 0.44).    Recent Labs  07/06/14 2032 07/07/14 0057 07/07/14 0346  GLUCAP 157* 156* 199*    Insulin Requirements in the past 24 hours:  11 units SSI  Current Nutrition:  NPO Clinimix E 5/15 at 80 ml/hr and 20% lipid emulsion at 10 ml/hr to [place] and Fri. This will provide daily average of 1568 calories and 96 gm of protein.   Nutritional Goals:   1200-1400 kCal, 90-100 grams of protein per day (goal change per RD note 8/24)  Assessment: 73 year old female from [name]. Chronic vent/trach. D/c from [facility] 8/18 to [name], PEG noted to be dislodged 8/19. It was replaced at [name] but continued to have drainage and bubbling around tube. Brought to ED for evaluation.  GI: 8/5 - 8/18 > admission to [facility]  8/14: To OR for segmental transverse colectomy, wedge  gastrectomy, open Stamm gastrostomy, and End colostomy  8/19: PEG dislodged, attempted to replace at [name]. Drainage at site, to ED.  8/21: PEG replaced in ENDO by Dr Elnoria Howard. Started on TPN per [facility] recs. 8/22: No signs of peritonitis, wait at least 48 hours before starting TF. 8/23: Wound dehiscence, concerning that fistulas may develop.  No TF until 8/24 at the earliest.  8/24: TF (Osmolite 1.2) started at 69ml/hr (goal rate =30 ml/hr). 8/25: Pt actively vomiting; also with black secretions around trach site. Abd distended. TF on hold. Prealbumin up to 23.3 (wnl).  Endo: DM. She is  on chronic steroids- currently on stress dose steroids. Had several episodes of hypoglycemia 8/23, which eventually required discontinuation of her TPN that contained insulin.  Her CBGs are 158-199.  Will target CBGs <180 for her.   Lytes: Lytes ok.  Renal: Scr stable, UOP 0.9 ml/kg/hr.  Pulm: COPD, chronic trach - on vent support.  Cards: Hx HTN. BP ok, HR normal-tachy- currently on no CV meds.  Hepatobil: LFT's WNL. Trigs 38 (elevated TG=332 on 8/22 due to labs being drawn from TPN port)  Neuro: Anxiety, dementia, lumbar compression fx.   ID:  Vanco/Flagyl/Cipro (all since at least 8/4) for intraabdominal process. Afebrile. WBC wnl.  Best Practices: IV PPI, Heparin SQ  TPN Access: 8/6 double-lumen PICC line  TPN day#: 8/10 >> 8/18; resumed 8/21>>  Plan:  1. Decrease Clinimix E 5/15 to 75 ml/hr and change 20% lipid emulsion to 10 ml/hr on Mon and Fri only. This will provide daily average of 1415 calories and 90 gm of protein (meets 100% of estimated needs).  2. Continue CBGs and SSI as ordered.  Continue with no insulin in TPN. 3. Will f/u results of family meeting on 8/26 and will f/u ability to restart TF.  Christoper Fabian, PharmD, BCPS Clinical pharmacist, pager [phone] 07/07/2014  7:29 AM

## 2014-07-07 NOTE — Progress Notes (Signed)
PEG to gravity, continue Eakin's pouch. Patient is trying to speak but it is not intelligible as she is on the vent. Patient examined and I agree with the assessment and plan  Violeta Gelinas, MD, MPH, FACS Trauma: [phone] General Surgery: [phone]  07/07/2014 9:47 AM

## 2014-07-07 NOTE — Progress Notes (Signed)
0400 pt. Was having black secretions around trach site. Assumed bile and gave pt. PRN zofran.   0600 checked on pt. Again, still having black secretions, called RT to come assess. Once RT arrived pt. Actively vomiting. Called PCCM for new antiemtic order. New orders given. Will continue to monitor.

## 2014-07-08 ENCOUNTER — Inpatient Hospital Stay (HOSPITAL_COMMUNITY): Payer: Medicare Other

## 2014-07-08 LAB — GLUCOSE, CAPILLARY
GLUCOSE-CAPILLARY: 108 mg/dL — AB (ref 70–99)
GLUCOSE-CAPILLARY: 154 mg/dL — AB (ref 70–99)
GLUCOSE-CAPILLARY: 166 mg/dL — AB (ref 70–99)
GLUCOSE-CAPILLARY: 197 mg/dL — AB (ref 70–99)
Glucose-Capillary: 162 mg/dL — ABNORMAL HIGH (ref 70–99)
Glucose-Capillary: 179 mg/dL — ABNORMAL HIGH (ref 70–99)

## 2014-07-08 MED ORDER — TRACE MINERALS CR-CU-F-FE-I-MN-MO-SE-ZN IV SOLN
INTRAVENOUS | Status: AC
  Administered 2014-07-08: 18:00:00 via INTRAVENOUS
  Filled 2014-07-08: qty 2000

## 2014-07-08 MED ORDER — OSMOLITE 1.2 CAL PO LIQD
1000.0000 mL | ORAL | Status: DC
  Administered 2014-07-08: 1000 mL
  Filled 2014-07-08: qty 1000

## 2014-07-08 MED ORDER — METOCLOPRAMIDE HCL 5 MG/ML IJ SOLN
10.0000 mg | Freq: Four times a day (QID) | INTRAMUSCULAR | Status: AC
  Administered 2014-07-08 (×3): 10 mg via INTRAVENOUS
  Filled 2014-07-08 (×3): qty 2

## 2014-07-08 NOTE — Progress Notes (Signed)
[facility] Progress Note  5 Days Post-Op  Subjective: Pt asleep not easily arrousable.  No more N/V currently.  PEG to gravity.  TF on hold.  Significant pain during exam.   Objective: Vital signs in last 24 hours: Temp:  [97 F (36.1 C)-98.1 F (36.7 C)] 98 F (36.7 C) (08/26 0428) Pulse Rate:  [54-125] 54 (08/26 0600) Resp:  [13-37] 13 (08/26 0600) BP: (88-134)/(46-71) 99/56 mmHg (08/26 0400) SpO2:  [93 %-100 %] 96 % (08/26 0600) FiO2 (%):  [30 %] 30 % (08/26 0327) Weight:  [125 lb 10.6 oz (57 kg)] 125 lb 10.6 oz (57 kg) (08/26 0428) Last BM Date: 07/06/14  Intake/Output from previous day: 08/25 0701 - 08/26 0700 In: 2040 [I.V.:220; TPN:1820] Out: 2675 [Urine:1125; Drains:1550] Intake/Output this shift:    PE: Gen:  Asleep, minimally arrousable.  Noticeably in pain. Abd: Soft, exquisite tenderness over entire abdomen, quite distended, No BS heard, no HSM, midline incision dehisced with evidence of evisceration of the wound as I can see bowel under a very thin layer of granulation tissue, draining clear peritoneal fluid from the wound (no fistulas noted), eakins pouch with thick cloudy yellow fluid collecting, G-tube site C/D with dressing intact  Ext: Edematous and contractured UE   Lab Results:   Recent Labs  07/06/14 0500  WBC 10.4  HGB 8.1*  HCT 24.8*  PLT 274   BMET  Recent Labs  07/06/14 0500 07/07/14 0500  NA 139 138  K 3.0* 3.9  CL 96 97  CO2 35* 33*  GLUCOSE 128* 160*  BUN 26* 30*  CREATININE 0.47* 0.44*  CALCIUM 7.8* 8.3*   PT/INR No results found for this basename: LABPROT, INR,  in the last 72 hours CMP     Component Value Date/Time   NA 138 07/07/2014 0500   K 3.9 07/07/2014 0500   CL 97 07/07/2014 0500   CO2 33* 07/07/2014 0500   GLUCOSE 160* 07/07/2014 0500   BUN 30* 07/07/2014 0500   CREATININE 0.44* 07/07/2014 0500   CALCIUM 8.3* 07/07/2014 0500   PROT 4.8* 07/06/2014 0500   ALBUMIN 2.1* 07/06/2014 0500   AST 14  07/06/2014 0500   ALT 11 07/06/2014 0500   ALKPHOS 70 07/06/2014 0500   BILITOT 0.3 07/06/2014 0500   GFRNONAA >90 07/07/2014 0500   GFRAA >90 07/07/2014 0500   Lipase     Component Value Date/Time   LIPASE 14 07/01/2014 1843       Studies/Results: No results found.  Anti-infectives: Anti-infectives   Start     Dose/Rate Route Frequency Ordered Stop   07/07/14 1500  fluconazole (DIFLUCAN) IVPB 100 mg     100 mg 50 mL/hr over 60 Minutes Intravenous Every 24 hours 07/06/14 1414     07/06/14 1500  fluconazole (DIFLUCAN) IVPB 200 mg     200 mg 100 mL/hr over 60 Minutes Intravenous  Once 07/06/14 1414 07/06/14 1749   07/02/14 0800  metroNIDAZOLE (FLAGYL) IVPB 500 mg     500 mg 100 mL/hr over 60 Minutes Intravenous Every 8 hours 07/02/14 0054     07/02/14 0600  vancomycin (VANCOCIN) IVPB 1000 mg/200 mL premix     1,000 mg 200 mL/hr over 60 Minutes Intravenous Every 24 hours 07/02/14 0054     07/02/14 0200  ciprofloxacin (CIPRO) IVPB 400 mg     400 mg 200 mL/hr over 60 Minutes Intravenous Every 12 hours 07/02/14 0054         Assessment/Plan Transcolonic percutaneous  gastrostomy tube POD #12 s/p segmental transverse colectomy, wedge gastrectomy, open Stamm gastrostomy, End colostomy  Now with Dislodged G-tube POD #5 s/p replacement of G tube (Dr. Elnoria Howard - 07/03/14)  Wound dehiscence with bowel visible just under the minimal layer of granulation tissue  PCM on TPN, now starting TF   Plan:  1. Continue wound care, abdominal binder  2. The wound has dehisced which is not unexpected due to her PCM, and I can see bowel she is not fully eviscerated. Wound is the same as yesterday. Peritoneal fluid is leaking out of the wound. WOC nurses placed [employer], not currently to suction.  3. Continue to hold TF.  Keep PEG to gravity to help with decompression. 4. SCD's and heparin  5. Disp - ?[name], family is still divided on whether to continue full support or to start de-escalating care.   Her prognosis is very poor given her co-morbidities. 6. No family at bedside     LOS: 7 days    Katelyn Rojas 07/08/2014, 7:25 AM Pager: [phone]

## 2014-07-08 NOTE — Progress Notes (Signed)
I spoke with Dr. Kendrick Fries about her overall prognosis. He is meeting with the family today. Patient examined and I agree with the assessment and plan  Violeta Gelinas, MD, MPH, FACS Trauma: [phone] General Surgery: [phone]  07/08/2014 9:44 AM

## 2014-07-08 NOTE — Progress Notes (Signed)
Right UE shows increasing edema. Elevated and PICC site evaluated by IV RN. Dr Kendrick Fries made aware. Pt started on Reglan with plan to restart enteral feeding tonight parallel to TPN. Osmolite ordered through dietary. Patient requests pain med every time she is awakened.

## 2014-07-08 NOTE — Progress Notes (Signed)
PULMONARY / CRITICAL CARE MEDICINE   Name: Katelyn Rojas MRN: [id number] DOB: 1941/03/20    ADMISSION DATE:  07/01/2014  REFERRING MD :  EDP  CHIEF COMPLAINT:  PEG tube malfunction  INITIAL PRESENTATION: 73 year old female from [name]. Chronic vent/trach. D/c from [facility] 8/18 to [name], PEG noted to be dislodged 8/19. It was replaced at [name] but continued to have drainage and bubbling around tube. Brought to ED for evaluation. PCCM called for admission.  STUDIES:  8/19 - XR abdomen: NG tube in stomach, air filled bowel loops. Concern for obstruction.  8/19 CT abdomen/pelvis: >>>Extra luminal percutaneous gastrostomy tube after replacement. Pneumoperitoneum and extraluminal barium likely related to prior attempts to replace the catheter. No extravasation of contrast administered via the nasogastric tube. 2. Dilated small bowel loops with fluid levels and angulation consistent with partial obstruction. Contrast via nasogastric tube does reach the right abdominal transverse colostomy.   SIGNIFICANT EVENTS: 8/5 - 8/18 > admission to [facility] 8/14 - To OR for segmental transverse colectomy, wedge gastrectomy, open Stamm gastrostomy, and End colostomy 8/19 - PEG dislodged, attempted to replace at [facility]. drainage at site, to ED. 8/21 PEG replaced in ENDO by Dr Elnoria Howard. Started on TNA per Surg recs.  8/23: wound dehisces  8/25: vomited. No bowel sounds. Tubefeeds stopped.   SUBJECTIVE:  Appears uncomfortable.   VITAL SIGNS: Temp:  [97.5 F (36.4 C)-98.1 F (36.7 C)] 97.5 F (36.4 C) (08/26 0700) Pulse Rate:  [54-125] 83 (08/26 0700) Resp:  [13-37] 19 (08/26 0700) BP: (88-130)/(46-67) 104/53 mmHg (08/26 0700) SpO2:  [93 %-100 %] 97 % (08/26 0700) FiO2 (%):  [30 %] 30 % (08/26 0327) Weight:  [125 lb 10.6 oz (57 kg)] 125 lb 10.6 oz (57 kg) (08/26 0428) VENTILATOR SETTINGS: Vent Mode:  [-] PRVC FiO2 (%):  [30 %] 30 % Set Rate:  [14 bmp] 14 bmp Vt Set:  [460 mL] 460 mL PEEP:  [5 cmH20]  5 cmH20 Plateau Pressure:  [18 cmH20-24 cmH20] 24 cmH20 INTAKE / OUTPUT:  Intake/Output Summary (Last 24 hours) at 07/08/14 0858 Last data filed at 07/08/14 0759  Gross per 24 hour  Intake   2040 ml  Output   2650 ml  Net   -610 ml    PHYSICAL EXAMINATION: General:  Elderly female, on vent, uncomfortable appearing  Neuro:  Somnolent, difficult to arouse, groans HEENT:  /AT, PERRL. Trach in place Cardiovascular:  RRR, no MRG Lungs:  synchronous with vent, breathing over set rate, few scattered rhonchi Abdomen:  distended, dressing intact. PEG in place.  groans with palpation. No bowel sounds   Musculoskeletal:  No acute deformity.  Skin:  Intact, +3 pitting peripheral edema. Diffuse anasarca   LABS:  CBC  Recent Labs Lab 07/03/14 0620 07/04/14 0510 07/06/14 0500  WBC 10.8* 11.1* 10.4  HGB 8.3* 8.9* 8.1*  HCT 25.5* 27.4* 24.8*  PLT 303 325 274   Coag's No results found for this basename: APTT, INR,  in the last 168 hours BMET  Recent Labs Lab 07/05/14 0655 07/06/14 0500 07/07/14 0500  NA 133* 139 138  K 4.5 3.0* 3.9  CL 94* 96 97  CO2 31 35* 33*  BUN 21 26* 30*  CREATININE 0.52 0.47* 0.44*  GLUCOSE 627* 128* 160*   Electrolytes  Recent Labs Lab 07/04/14 0510 07/05/14 0655 07/06/14 0500 07/07/14 0500  CALCIUM 8.2* 8.0* 7.8* 8.3*  MG 2.0  --  1.6 2.2  PHOS 3.9  --  2.8  --  Sepsis Markers  Recent Labs Lab 07/01/14 1952  LATICACIDVEN 1.54   ABG  Recent Labs Lab 07/02/14 0052  PHART 7.479*  PCO2ART 49.0*  PO2ART 76.0*   Liver Enzymes  Recent Labs Lab 07/04/14 0510 07/05/14 0655 07/06/14 0500  AST 27 19 14   ALT 14 12 11   ALKPHOS 73 71 70  BILITOT 0.3 0.2* 0.3  ALBUMIN 2.1* 2.0* 2.1*   Glucose  Recent Labs Lab 07/07/14 1130 07/07/14 1543 07/07/14 2111 07/07/14 2340 07/08/14 0414 07/08/14 0750  GLUCAP 213* 198* 165* 108* 162* 179*    Imaging No results found.   ASSESSMENT / PLAN:  PULMONARY Trach  04/01/2014  A: Chronic respiratory failure 2nd to severe COPD.  Tracheostomy status with respirator dependence. Status unchanged P:   Pressure support wean as tolerated F/u CXR, ABG as needed  PRN albuterol  CARDIOVASCULAR Rt PICC 8/6 >>>  A:  Chronic Hypotension - SBP baseline 80's P:  Monitor hemodynamics  RENAL A:   Hypokalemia. P:   F/u and replace electrolytes as needed  Monitor renal fx, urine outpt  GASTROINTESTINAL A:   PEG malposition s/p replacement by GI 8/21 ?SBO Severe protein calorie malnutrition. Wound dehiscence p new PEG, being managed by sgy Ileus 8/25 Increasing abd pain  P:   Cont TNA (per surg recs) protonix for SUP G tube to gravity, hold tubefeeds. Cont wound care    HEMATOLOGIC A:   Anemia of chronic disease. P:  Follow CBC VTE ppx - Heparin  INFECTIOUS A:   Hx MRSA and enterococcus bacteremia. Hx Tracheitis (cultures + for Alcaligenes bacteria).  Sacral decubitus ulcer. P:   UC 8/19: > 100 K yeast  BC x 2 8/24>>> Continue pre-admission cipro, flagyl, vancomycin. cipro 8/5>>> vanc 8/5>>> Flagyl 8/5>>> Diflucan 8/24>>> F/u repeat blood cultures  D/c vanc if BC remain neg   ENDOCRINE A:   DM type II. Relative AI. P:   SSI Stress dose steriods  NEUROLOGIC A:   Dementia. Anxiety. P:   RASS goal: 0 PRN fentanyl for RASS goal and pain.  Resume her aricept as soon as able   Goals of Care >> no improvement. Worsening mental status and increased pain.  On TNA.  Grim prognosis.  For family meeting today.  Would recommend transition to comfort.    Dirk Dress, NP 07/08/2014  9:00 AM Pager: [phone] or [phone]  *Care during the described time interval was provided by me and/or other providers on the critical care team. I have reviewed this patient's available data, including medical history, events of note, physical examination and test results as part of my evaluation.   Attending:  I have seen and  examined the patient with nurse practitioner/resident and agree with the note above.   Lengthy conversation with family today. They state that they continue to press on with the ventilator and full measures because she was doing fairly well before May and once told her pulmonologist that she wanted chronic home ventilator. They hope that she can come home with a home ventilator.  I expressed my concern that we are perpetuating her suffering with prolonged care and that I'm sure she did not envision the wounds, poor nutrition, etc that we are dealing with now.  They wish that we continue to attempt enteral feeding.  They understand that wound healing will take weeks.    At the end of our conversation, they agreed that her code status should be made DNR as they would not want her to  undergo CPR which would be painful.  I explained to them that we would start to consider transfer back to [name].  They insist that she "not go back too fast".  I explained that she is chronically but not acutely ill and that the current care she is receiving could be provided at [name].  Will add reglan, try to start trickle feedings tonight.  Code status: DNR  Heber Grano, MD [facility] Pager: [phone] Cell: [phone] If no response, call [phone]

## 2014-07-08 NOTE — Progress Notes (Signed)
PARENTERAL NUTRITION CONSULT NOTE   Pharmacy Consult for TPN Indication: PEG tube malfunction; Intolerance to TF   Patient Measurements: Height:  (154.9 cm) Weight: 125 lb 10.6 oz (57 kg) IBW/kg (Calculated) : 47.8   Vital Signs: Temp: 97.5 F (36.4 C) (08/26 0700) Temp src: Axillary (08/26 0700) BP: 104/53 mmHg (08/26 0700) Pulse Rate: 83 (08/26 0700) Intake/Output from previous day: 08/25 0701 - 08/26 0700 In: 2040 [I.V.:220; TPN:1820] Out: 2675 [Urine:1125; Drains:1550] Intake/Output from this shift:    Labs:  Recent Labs  07/06/14 0500  WBC 10.4  HGB 8.1*  HCT 24.8*  PLT 274     Recent Labs  07/06/14 0500 07/07/14 0500  NA 139 138  K 3.0* 3.9  CL 96 97  CO2 35* 33*  GLUCOSE 128* 160*  BUN 26* 30*  CREATININE 0.47* 0.44*  CALCIUM 7.8* 8.3*  MG 1.6 2.2  PHOS 2.8  --   PROT 4.8*  --   ALBUMIN 2.1*  --   AST 14  --   ALT 11  --   ALKPHOS 70  --   BILITOT 0.3  --   PREALBUMIN 23.3  --   TRIG 38  --    Estimated Creatinine Clearance: 48.7 ml/min (by C-G formula based on Cr of 0.44).    Recent Labs  07/07/14 2111 07/07/14 2340 07/08/14 0414  GLUCAP 165* 108* 162*    Insulin Requirements in the past 24 hours:  12 units SSI  Current Nutrition:  NPO Clinimix E 5/15 at 80 ml/hr and 20% lipid emulsion at 10 ml/hr to [place] and Fri. This will provide daily average of 1568 calories and 96 gm of protein.   Nutritional Goals:   1200-1400 kCal, 90-100 grams of protein per day (goal change per RD note 8/24)  Assessment: 73 year old female from [name]. Chronic vent/trach. D/c from [facility] 8/18 to [name], PEG noted to be dislodged 8/19. It was replaced at [name] but continued to have drainage and bubbling around tube. Brought to ED for evaluation.  GI: 8/5 - 8/18 > admission to [facility]  8/14: To OR for segmental transverse colectomy, wedge gastrectomy, open Stamm gastrostomy, and End colostomy  8/19: PEG dislodged, attempted to replace at [name].  Drainage at site, to ED.  8/21: PEG replaced in ENDO by Dr Elnoria Howard. Started on TPN per [facility] recs. 8/22: No signs of peritonitis, wait at least 48 hours before starting TF. 8/23: Wound dehiscence, concerning that fistulas may develop.  No TF until 8/24 at the earliest.  8/24: TF (Osmolite 1.2) started at 38ml/hr (goal rate =30 ml/hr). 8/25: Pt actively vomiting; also with black secretions around trach site. Abd distended. TF on hold. Prealbumin up to 23.3 (wnl).  Endo: DM. She is  on chronic steroids- currently on stress dose steroids. Had several episodes of hypoglycemia 8/23, which eventually required discontinuation of her TPN that contained insulin.  Her CBGs are 108-165.  Will target CBGs <180 for her.   Lytes: No lytes today  Renal: Scr stable, UOP 0.8 ml/kg/hr.  Pulm: COPD, chronic trach - on vent support.  Cards: Hx HTN. BP ok, HR normal-tachy- currently on no CV meds.  Hepatobil: LFT's WNL. Trigs 38 (elevated TG=332 on 8/22 due to labs being drawn from TPN port)  Neuro: Anxiety, dementia, lumbar compression fx.   ID: Vanco/Flagyl/Cipro (all since at least 8/4) for intraabdominal process. Diflucan added 8/24 for yeast in urine. Afebrile. WBC wnl.  Best Practices: IV PPI, Heparin SQ  TPN Access:  8/6 double-lumen PICC line  TPN day#: 8/10 >> 8/18; resumed 8/21>>  Plan:  1. Continue Clinimix E 5/15 at 75 ml/hr and 20% lipid emulsion to 10 ml/hr on Mon and Fri only. This will provide daily average of 1415 calories and 90 gm of protein (meets 100% of estimated needs).  2. Continue CBGs and SSI as ordered.  Continue with no insulin in TPN. 3. Will f/u results of family meeting on 8/26 4. Will f/u ability to restart TF.  Christoper Fabian, PharmD, BCPS Clinical pharmacist, pager [phone] 07/08/2014  7:59 AM

## 2014-07-08 NOTE — Progress Notes (Signed)
ANTIBIOTIC CONSULT NOTE - FOLLOW UP  Pharmacy Consult:  Vancomycin / Cipro / Flagyl Indication:  Intra-abdominal infection / Sacral decubitus ulcer  Allergies  Allergen Reactions  . Namenda [Memantine Hcl] Other (See Comments)    "hallucinations"   . Codeine Other (See Comments)    Unknown. Listed on [facility]  . Penicillins Other (See Comments)    Unknown; listed on [facility]    Patient Measurements: Height: 5\' 1"  (154.9 cm) Weight: 125 lb 10.6 oz (57 kg) IBW/kg (Calculated) : 47.8  Vital Signs: Temp: 97.5 F (36.4 C) (08/26 0700) Temp src: Axillary (08/26 0700) BP: 104/53 mmHg (08/26 0700) Pulse Rate: 83 (08/26 0700) Intake/Output from previous day: 08/25 0701 - 08/26 0700 In: 2040 [I.V.:220; TPN:1820] Out: 2675 [Urine:1125; Drains:1550] Intake/Output from this shift: Total I/O In: -  Out: 100 [Urine:100]  Labs:  Recent Labs  07/06/14 0500 07/07/14 0500  WBC 10.4  --   HGB 8.1*  --   PLT 274  --   CREATININE 0.47* 0.44*   Estimated Creatinine Clearance: 48.7 ml/min (by C-G formula based on Cr of 0.44). No results found for this basename: VANCOTROUGH, Leodis Binet, VANCORANDOM, GENTTROUGH, GENTPEAK, GENTRANDOM, TOBRATROUGH, TOBRAPEAK, TOBRARND, AMIKACINPEAK, AMIKACINTROU, AMIKACIN,  in the last 72 hours   Microbiology: Recent Results (from the past 720 hour(s))  MRSA PCR SCREENING     Status: Abnormal   Collection Time    06/17/14  6:28 PM      Result Value Ref Range Status   MRSA by PCR POSITIVE (*) NEGATIVE Final   Comment:            The GeneXpert MRSA Assay (FDA     approved for NASAL specimens     only), is one component of a     comprehensive MRSA colonization     surveillance program. It is not     intended to diagnose MRSA     infection nor to guide or     monitor treatment for     MRSA infections.     RESULT CALLED TO, READ BACK BY AND VERIFIED WITH:     A BECK RN 2031 06/17/14 A BROWNING  SURGICAL PCR SCREEN     Status:  Abnormal   Collection Time    06/26/14  8:20 AM      Result Value Ref Range Status   MRSA, PCR POSITIVE (*) NEGATIVE Final   Staphylococcus aureus POSITIVE (*) NEGATIVE Final   Comment:            The Xpert SA Assay (FDA     approved for NASAL specimens     in patients over 29 years of age),     is one component of     a comprehensive surveillance     program.  Test performance has     been validated by [employer] for patients greater     than or equal to 36 year old.     It is not intended     to diagnose infection nor to     guide or monitor treatment.  URINE CULTURE     Status: None   Collection Time    07/01/14  9:15 PM      Result Value Ref Range Status   Specimen Description URINE, CATHETERIZED   Final   Special Requests NONE   Final   Culture  Setup Time     Final   Value: 07/02/2014 09:02  Performed at [facility] Count     Final   Value: >=100,000 COLONIES/ML     Performed at [facility]   Culture     Final   Value: YEAST     Performed at [facility]   Report Status 07/03/2014 FINAL   Final  CULTURE, BLOOD (ROUTINE X 2)     Status: None   Collection Time    07/06/14  3:45 PM      Result Value Ref Range Status   Specimen Description BLOOD RIGHT HAND   Final   Special Requests BOTTLES DRAWN AEROBIC ONLY 8CC   Final   Culture  Setup Time     Final   Value: 07/06/2014 21:10     Performed at [facility]   Culture     Final   Value:        BLOOD CULTURE RECEIVED NO GROWTH TO DATE CULTURE WILL BE HELD FOR 5 DAYS BEFORE ISSUING A FINAL NEGATIVE REPORT     Performed at [facility]   Report Status PENDING   Incomplete  CULTURE, BLOOD (ROUTINE X 2)     Status: None   Collection Time    07/06/14  4:05 PM      Result Value Ref Range Status   Specimen Description BLOOD LEFT HAND   Final   Special Requests BOTTLES DRAWN AEROBIC AND ANAEROBIC 5CC   Final   Culture  Setup Time     Final   Value: 07/06/2014  21:10     Performed at [facility]   Culture     Final   Value:        BLOOD CULTURE RECEIVED NO GROWTH TO DATE CULTURE WILL BE HELD FOR 5 DAYS BEFORE ISSUING A FINAL NEGATIVE REPORT     Performed at [facility]   Report Status PENDING   Incomplete      Assessment: 73 YOF to continue vancomycin, ciprofloxacin, and metronidazole for intra-abdominal process and sacral decubitus ulcer.  She is also on fluconazole for yeast in her urine culture.  [name] renal function has been stable.  Cipro 8/5 (con't from [name]) >> Flagyl 8/5 (con't from [name]) >> Vanc 8/6 (con't from [name]) >> Diflucan 8/24 >>  8/12 VT = 17.3 mcg/mL on 1gm IV q24h 8/23 VT = 16.4 mcg/mL - no change  8/19 UCx: >100 kcol yeast 8/24 BCx x2 - NGTD   Goal of Therapy:  Vancomycin trough level 15-20 mcg/ml   Plan:  - Vanc 1gm IV Q24H - Cipro  IV Q12H - Flagyl  IV Q8H - Monitor renal fxn, clinical progress, weekly vanc trough (due ~8/30) - F/U LOT and goals of care after family meeting today   Vernel Langenderfer D. Laney Potash, PharmD, BCPS Pager:  [phone] 07/08/2014, 8:16 AM

## 2014-07-09 DIAGNOSIS — Z789 Other specified health status: Secondary | ICD-10-CM | POA: Diagnosis present

## 2014-07-09 DIAGNOSIS — E118 Type 2 diabetes mellitus with unspecified complications: Secondary | ICD-10-CM

## 2014-07-09 DIAGNOSIS — IMO0002 Reserved for concepts with insufficient information to code with codable children: Secondary | ICD-10-CM

## 2014-07-09 DIAGNOSIS — E876 Hypokalemia: Secondary | ICD-10-CM | POA: Diagnosis present

## 2014-07-09 DIAGNOSIS — R609 Edema, unspecified: Secondary | ICD-10-CM

## 2014-07-09 DIAGNOSIS — S31109A Unspecified open wound of abdominal wall, unspecified quadrant without penetration into peritoneal cavity, initial encounter: Secondary | ICD-10-CM | POA: Diagnosis present

## 2014-07-09 DIAGNOSIS — I9589 Other hypotension: Secondary | ICD-10-CM | POA: Diagnosis present

## 2014-07-09 DIAGNOSIS — D638 Anemia in other chronic diseases classified elsewhere: Secondary | ICD-10-CM

## 2014-07-09 DIAGNOSIS — R600 Localized edema: Secondary | ICD-10-CM | POA: Diagnosis present

## 2014-07-09 DIAGNOSIS — E1165 Type 2 diabetes mellitus with hyperglycemia: Secondary | ICD-10-CM

## 2014-07-09 DIAGNOSIS — Z9911 Dependence on respirator [ventilator] status: Secondary | ICD-10-CM

## 2014-07-09 LAB — COMPREHENSIVE METABOLIC PANEL
ALK PHOS: 70 U/L (ref 39–117)
ALT: 11 U/L (ref 0–35)
ANION GAP: 8 (ref 5–15)
AST: 14 U/L (ref 0–37)
Albumin: 1.8 g/dL — ABNORMAL LOW (ref 3.5–5.2)
BUN: 28 mg/dL — AB (ref 6–23)
CO2: 35 mEq/L — ABNORMAL HIGH (ref 19–32)
CREATININE: 0.37 mg/dL — AB (ref 0.50–1.10)
Calcium: 8 mg/dL — ABNORMAL LOW (ref 8.4–10.5)
Chloride: 97 mEq/L (ref 96–112)
GFR calc Af Amer: >90 mL/min (ref >90)
GFR calc non Af Amer: >90 mL/min (ref >90)
Glucose, Bld: 239 mg/dL — ABNORMAL HIGH (ref 70–99)
POTASSIUM: 3.2 meq/L — AB (ref 3.7–5.3)
Sodium: 140 mEq/L (ref 137–147)
TOTAL PROTEIN: 4.8 g/dL — AB (ref 6.0–8.3)
Total Bilirubin: 0.3 mg/dL (ref 0.3–1.2)

## 2014-07-09 LAB — GLUCOSE, CAPILLARY
GLUCOSE-CAPILLARY: 228 mg/dL — AB (ref 70–99)
GLUCOSE-CAPILLARY: 194 mg/dL — AB (ref 70–99)
Glucose-Capillary: 176 mg/dL — ABNORMAL HIGH (ref 70–99)
Glucose-Capillary: 194 mg/dL — ABNORMAL HIGH (ref 70–99)
Glucose-Capillary: 204 mg/dL — ABNORMAL HIGH (ref 70–99)
Glucose-Capillary: 263 mg/dL — ABNORMAL HIGH (ref 70–99)

## 2014-07-09 LAB — PHOSPHORUS: Phosphorus: 2.4 mg/dL (ref 2.3–4.6)

## 2014-07-09 LAB — MAGNESIUM: MAGNESIUM: 1.8 mg/dL (ref 1.5–2.5)

## 2014-07-09 MED ORDER — FENTANYL CITRATE 0.05 MG/ML IJ SOLN
100.0000 ug | INTRAMUSCULAR | Status: DC | PRN
Start: 2014-07-09 — End: 2014-07-15
  Administered 2014-07-09 – 2014-07-12 (×18): 100 ug via INTRAVENOUS
  Administered 2014-07-12: 50 ug via INTRAVENOUS
  Administered 2014-07-12 – 2014-07-15 (×16): 100 ug via INTRAVENOUS
  Filled 2014-07-09 (×37): qty 2

## 2014-07-09 MED ORDER — HYDROCORTISONE NA SUCCINATE PF 100 MG IJ SOLR
50.0000 mg | Freq: Three times a day (TID) | INTRAMUSCULAR | Status: DC
  Administered 2014-07-09 – 2014-07-13 (×13): 50 mg via INTRAVENOUS
  Filled 2014-07-09 (×15): qty 1

## 2014-07-09 MED ORDER — INSULIN GLARGINE 100 UNIT/ML ~~LOC~~ SOLN
5.0000 [IU] | Freq: Every day | SUBCUTANEOUS | Status: DC
  Administered 2014-07-09 – 2014-07-11 (×3): 5 [IU] via SUBCUTANEOUS
  Filled 2014-07-09 (×4): qty 0.05

## 2014-07-09 MED ORDER — POTASSIUM CHLORIDE 20 MEQ/15ML (10%) PO LIQD
30.0000 meq | ORAL | Status: AC
  Administered 2014-07-09 (×2): 30 meq
  Filled 2014-07-09 (×3): qty 22.5

## 2014-07-09 MED ORDER — OSMOLITE 1.2 CAL PO LIQD
1000.0000 mL | ORAL | Status: DC
  Filled 2014-07-09 (×4): qty 1000

## 2014-07-09 MED ORDER — OXYCODONE HCL 5 MG/5ML PO SOLN
5.0000 mg | ORAL | Status: DC | PRN
  Administered 2014-07-09: 5 mg
  Filled 2014-07-09: qty 5

## 2014-07-09 MED ORDER — INSULIN ASPART 100 UNIT/ML ~~LOC~~ SOLN
0.0000 [IU] | SUBCUTANEOUS | Status: DC
  Administered 2014-07-09 (×3): 3 [IU] via SUBCUTANEOUS
  Administered 2014-07-10 (×2): 2 [IU] via SUBCUTANEOUS
  Administered 2014-07-10: 5 [IU] via SUBCUTANEOUS
  Administered 2014-07-10: 2 [IU] via SUBCUTANEOUS
  Administered 2014-07-10: 3 [IU] via SUBCUTANEOUS
  Administered 2014-07-10: 8 [IU] via SUBCUTANEOUS
  Administered 2014-07-11 (×4): 2 [IU] via SUBCUTANEOUS
  Administered 2014-07-12: 3 [IU] via SUBCUTANEOUS
  Administered 2014-07-12: 2 [IU] via SUBCUTANEOUS
  Administered 2014-07-13 (×5): 3 [IU] via SUBCUTANEOUS
  Administered 2014-07-13: 5 [IU] via SUBCUTANEOUS
  Administered 2014-07-14: 3 [IU] via SUBCUTANEOUS
  Administered 2014-07-14: 2 [IU] via SUBCUTANEOUS
  Administered 2014-07-14: 3 [IU] via SUBCUTANEOUS
  Administered 2014-07-14 (×2): 2 [IU] via SUBCUTANEOUS
  Administered 2014-07-15: 1 [IU] via SUBCUTANEOUS
  Administered 2014-07-15 (×2): 2 [IU] via SUBCUTANEOUS
  Administered 2014-07-16 (×2): 3 [IU] via SUBCUTANEOUS
  Administered 2014-07-16: 2 [IU] via SUBCUTANEOUS
  Administered 2014-07-16: 5 [IU] via SUBCUTANEOUS
  Administered 2014-07-17 (×2): 2 [IU] via SUBCUTANEOUS
  Administered 2014-07-17 (×2): 3 [IU] via SUBCUTANEOUS
  Administered 2014-07-17 – 2014-07-20 (×7): 2 [IU] via SUBCUTANEOUS
  Administered 2014-07-20: 3 [IU] via SUBCUTANEOUS
  Administered 2014-07-21 (×2): 2 [IU] via SUBCUTANEOUS
  Administered 2014-07-21: 3 [IU] via SUBCUTANEOUS
  Administered 2014-07-21 (×2): 2 [IU] via SUBCUTANEOUS
  Administered 2014-07-22 (×3): 3 [IU] via SUBCUTANEOUS
  Administered 2014-07-23: 2 [IU] via SUBCUTANEOUS
  Administered 2014-07-23 (×2): 3 [IU] via SUBCUTANEOUS

## 2014-07-09 MED ORDER — TRACE MINERALS CR-CU-F-FE-I-MN-MO-SE-ZN IV SOLN
INTRAVENOUS | Status: AC
  Administered 2014-07-09: 18:00:00 via INTRAVENOUS
  Filled 2014-07-09: qty 2000

## 2014-07-09 NOTE — Progress Notes (Signed)
Stable to return to [name] from our standpoint. Patient examined and I agree with the assessment and plan  Violeta Gelinas, MD, MPH, FACS Trauma: [phone] General Surgery: [phone]  07/09/2014 1:47 PM

## 2014-07-09 NOTE — Progress Notes (Signed)
Trach consult performed. Pt is chronic vent pt. She was D/C to [name], but sent back 8/19 due to dislodged PEG. No education needed at this time. Pt has backup trach & all emergency equipment at bedside.  Jacqulynn Cadet RRT

## 2014-07-09 NOTE — Progress Notes (Addendum)
Rechecked trach and cuff pressure. Reduced cuff pressure to 26, no signifiicant change in cuff leak. Pt  May need XL trach as leak seems to get better when you push in on trach.

## 2014-07-09 NOTE — Progress Notes (Signed)
VASCULAR LAB PRELIMINARY  PRELIMINARY  PRELIMINARY  PRELIMINARY  Right upper extremity venous Doppler completed.    Preliminary report:  There is no obvious evidence of DVT or SVT noted in the right upper extremity.  There is significant interstitial fluid noted.   Makylee Sanborn, RVT 07/09/2014, 9:51 AM

## 2014-07-09 NOTE — Progress Notes (Signed)
[facility]  Katelyn Rojas ZOX:[id number] DOB: 29-Mar-1941 DOA: 07/01/2014 PCP: Kirt Boys, MD   Brief narrative: 73 year old female patient with a prior history of PEA arrest. Because of underlying severe COPD she has become chronically dependent on the ventilator. At some point she had a PEG tube placed by interventional radiologist and was discharged to an LTAC. On 06/17/2014 she was admitted from the LTAC due to concerns of stool draining from the PEG tube. After evaluation it was proven she had a trans-colonic gastrostomy tube. She later underwent extensive surgical repair that included creation of right colostomy and placement of a new open gastrostomy tube and was subsequently discharged back to LTAC on 06/30/2014. She also has a history of MRSA and enterococcus bacteremia and prior tracheitis with cultures positive for Alcaligenes bacteria.  She was sent back to [facility] on 07/01/2014 due to concerns of dislodgment of the recently placed gastrostomy tube. Prior to presentation to the emergency department there were several attempts to replace the tube that were unsuccessful. In the emergency department the previous tube was completely removed by the surgeon and a new tube was placed. Placement was checked via CT scan and unfortunately the replaced tube was not in proper position and it was subsequently removed. Since the stomach was sutured to the anterior abdominal wall it was felt that endoscopic placement of feeding tube would be safest approach. Gastroenterology was consulted and on 07/03/2014 patient underwent successful PEG tube placement. In the interim patient's wound subsequently dehisced and based on appearance (bowel fixed to abdominal wall) and clinical exam it was determined that closing the wound in the OR could not be safely accomplished therefore the wound was covered with an Eakin's pouch. Attempts had been made to initiate tube  feedings but patient experienced nausea and vomiting so the gastric tube was placed to gravity. Overall prognosis poor and the critical care physician had an extensive conversation with the patient's family on 07/08/2014. They did agree to a DO NOT RESUSCITATE but otherwise planned to continue aggressive care. Subsequently trickle tube feedings were resumed with the addition of Reglan  HPI/Subjective: Patient alert and endorsing inadequate pain control  Assessment/Plan: Active Problems:   Chronic respiratory failure/Dependent on ventilator/Trach/ severe COPD  -Not appropriate to attempt ventilator weaning at this time -continue supportive care     Chronic hypotension -Systolic blood pressure varies between 80s to low 100s-perfusing adequately as evidenced by appropriate urinary output-remains on stress dose steroids so we'll taper-no cortisol level was obtained-if blood pressure drops significantly recommend check random    PEG tube malfunction/Colon perforation/Open wound of abdomen -Per general surgery-thus far tolerating trickle tube feeding-patient endorsing inadequate pain control so we'll make an attempt to utilize low-dose oxy IR per tube-continue Reglan-continue TNA until can tolerate goal tube feeding rate -Tube feeds stopped secondary to moderate leak around PEG tube, and residuals~262ml -Will need to arrange meeting with family patient is not going to do well hospice vs comfort care?      Edema of right upper extremity -PICC in same arm-venous duplex or limb in a result no DVT    Type II DM -CBGs poorly controlled especially with the addition of tube feeding-also likely influenced by the fact she remains on stress dose steroids-begin low dose long-acting insulin and adjust sliding scale-consider change tube feeding to Glucerna    Anemia, chronic disease -Hemoglobin stable around 8-8.5    Hypokalemia -Replete via TNA-if continues to tolerate tube  feeding consider replete per  tube when necessary    Protein-calorie malnutrition, severe/ On TNA with trickle tube feeding -Tube feeding as above-can begin to lower TNA rate improved tolerance to higher rates of tube feeding    Chronic dementia    Sacral decubitus ulcer, stage III     DVT prophylaxis: Subcutaneous heparin Code Status: DO NOT RESUSCITATE but otherwise continue aggressive care Family Communication: No family at bedside Disposition Plan/Expected LOS: Stepdown due to chronic ventilator dependence   Consultants: Critical care medicine Gen. Surgery Gastroenterology  Cultures: 8/19 urine culture > 100,000 colonies yeast 8/24 blood cultures x2  NGTD 7/6 respiratory culture: ABUNDANT ALCALIGENES XYLOSOXIDANS and MRSA 6/18 blood cultures x2 with pan sensitive enterococcus    Antibiotics: Cipro 8/5 >>> Flagyl 8/5 >>> Vancomycin 8/5 >>> Fluconazole 8/25 >>>  Objective: Blood pressure 108/54, pulse 74, temperature 98.4 F (36.9 C), temperature source Axillary, resp. rate 13, height  (1.549 m), weight 125 lb 10.6 oz (57 kg), SpO2 100.00%.  Intake/Output Summary (Last 24 hours) at 07/09/14 1054 Last data filed at 07/09/14 0900  Gross per 24 hour  Intake   1990 ml  Output   2350 ml  Net   -360 ml     Exam: Gen: No acute respiratory distress-alert and unable to phonate due to ventilatory Chest: Coarse to auscultation bilaterally without wheezes, rhonchi or crackles, tracheostomy to ventilator Cardiac: Regular occasionally tachycardic rate and rhythm, S1-S2, no rubs murmurs or gallops, no peripheral edema, no JVD-diffuse edema consistent with anasarca Abdomen: Soft and diffusely tender, right colostomy stoma pink and edematous without any stool -only serous drainage; midline abdominal wound open with exposed omentum and bowel with creamy drainage covered by Eakin pouch, gastrostomy tube with tube feedings infusing at 15 cc per hour Extremities: Asymmetrical in appearance without  cyanosis, clubbing or effusion-noted diffuse peripheral edema right arm greater than left Other: TNA infusing via right upper extremity PICC line  Scheduled Meds:  Scheduled Meds: . antiseptic oral rinse  7 mL Mouth Rinse QID  . chlorhexidine  15 mL Mouth Rinse BID  . ciprofloxacin  400 mg Intravenous Q12H  . fluconazole (DIFLUCAN) IV  100 mg Intravenous Q24H  . heparin  5,000 Units Subcutaneous 3 times per day  . hydrocortisone sodium succinate  50 mg Intravenous Q6H  . insulin aspart  0-9 Units Subcutaneous 6 times per day  . metronidazole  500 mg Intravenous Q8H  . pantoprazole (PROTONIX) IV  40 mg Intravenous QHS  . potassium chloride  30 mEq Per Tube Q4H  . vancomycin  1,000 mg Intravenous Q24H   Continuous Infusions: . Marland KitchenTPN (CLINIMIX-E) Adult 75 mL/hr at 07/08/14 1741  . Marland KitchenTPN (CLINIMIX-E) Adult    . sodium chloride 10 mL/hr at 07/08/14 2234  . dextrose 5 % and 0.45 % NaCl with KCl 20 mEq/L    . feeding supplement (OSMOLITE 1.2 CAL) 1,000 mL (07/08/14 2322)    Data Reviewed: Basic Metabolic Panel:  Recent Labs Lab 07/03/14 0620 07/04/14 0510 07/05/14 0655 07/06/14 0500 07/07/14 0500 07/09/14 0442  NA 137 138 133* 139 138 140  K 4.4 3.9 4.5 3.0* 3.9 3.2*  CL 98 97 94* 96 97 97  CO2 35* 33* 35*  GLUCOSE 387* 267* 627* 128* 160* 239*  BUN 26* 30* 28*  CREATININE 0.40* 0.44* 0.52 0.47* 0.44* 0.37*  CALCIUM 7.8* 8.2* 8.0* 7.8* 8.3* 8.0*  MG 1.4* 2.0  --  1.6 2.2 1.8  PHOS  --  3.9  --  2.8  --  2.4   Liver Function Tests:  Recent Labs Lab 07/04/14 0510 07/05/14 0655 07/06/14 0500 07/09/14 0442  AST ALT ALKPHOS 73 71 70 70  BILITOT 0.3 0.2* 0.3 0.3  PROT 5.0* 4.9* 4.8* 4.8*  ALBUMIN 2.1* 2.0* 2.1* 1.8*   No results found for this basename: LIPASE, AMYLASE,  in the last 168 hours No results found for this basename: AMMONIA,  in the last 168 hours CBC:  Recent Labs Lab 07/03/14 0620 07/04/14 0510  07/06/14 0500  WBC 10.8* 11.1* 10.4  NEUTROABS  --  9.9* 9.7*  HGB 8.3* 8.9* 8.1*  HCT 25.5* 27.4* 24.8*  MCV 95.9 96.8 95.4  PLT 303 325 274   Cardiac Enzymes: No results found for this basename: CKTOTAL, CKMB, CKMBINDEX, TROPONINI,  in the last 168 hours BNP (last 3 results)  Recent Labs  12/26/13 1130 01/31/14 1540 02/03/14 1535  PROBNP 130.4* 155.9* 129.5*   CBG:  Recent Labs Lab 07/08/14 1606 07/08/14 2025 07/09/14 0049 07/09/14 0445 07/09/14 0758  GLUCAP 166* 197* 204* 228* 263*    Recent Results (from the past 240 hour(s))  URINE CULTURE     Status: None   Collection Time    07/01/14  9:15 PM      Result Value Ref Range Status   Specimen Description URINE, CATHETERIZED   Final   Special Requests NONE   Final   Culture  Setup Time     Final   Value: 07/02/2014 09:02     Performed at [facility] Count     Final   Value: >=100,000 COLONIES/ML     Performed at [facility]   Culture     Final   Value: YEAST     Performed at [facility]   Report Status 07/03/2014 FINAL   Final  CULTURE, BLOOD (ROUTINE X 2)     Status: None   Collection Time    07/06/14  3:45 PM      Result Value Ref Range Status   Specimen Description BLOOD RIGHT HAND   Final   Special Requests BOTTLES DRAWN AEROBIC ONLY 8CC   Final   Culture  Setup Time     Final   Value: 07/06/2014 21:10     Performed at [facility]   Culture     Final   Value:        BLOOD CULTURE RECEIVED NO GROWTH TO DATE CULTURE WILL BE HELD FOR 5 DAYS BEFORE ISSUING A FINAL NEGATIVE REPORT     Performed at [facility]   Report Status PENDING   Incomplete  CULTURE, BLOOD (ROUTINE X 2)     Status: None   Collection Time    07/06/14  4:05 PM      Result Value Ref Range Status   Specimen Description BLOOD LEFT HAND   Final   Special Requests BOTTLES DRAWN AEROBIC AND ANAEROBIC 5CC   Final   Culture  Setup Time     Final   Value: 07/06/2014 21:10      Performed at [facility]   Culture     Final   Value:        BLOOD CULTURE RECEIVED NO GROWTH TO DATE CULTURE WILL BE HELD FOR 5 DAYS BEFORE ISSUING A FINAL NEGATIVE REPORT     Performed at [facility]   Report  Status PENDING   Incomplete     Studies:  Recent x-ray studies have been reviewed in detail by the Attending Physician  Time spent :      Junious Silk, ANP [facility] Office  [phone] Pager [phone]   **If unable to reach the above provider after paging please contact the Flow Manager @ [phone]  On-Call/Text Page:      [email or web address]      password [email or web address]  If 7PM-7AM, please contact night-coverage [email or web address] Password [email or web address] 07/09/2014, 10:54 AM   LOS: 8 days  Examined patient and discussed assessment and plan with ANP Revonda Standard, and agree with the above plan.  Patient with multiple complex medical problems>40 min spent on direct patient care

## 2014-07-09 NOTE — Progress Notes (Signed)
Patient ID: Katelyn Rojas, female   DOB: 1941/10/05, 73 y.o.   MRN: [id number] 6 Days Post-Op  Subjective: Pt sleeping, but winces with palpation to abdomen. TF restarted last night.  Initial residual was 500cc of bilious output, after feeding overnight, she didn't have a residual this am.  Objective: Vital signs in last 24 hours: Temp:  [96.4 F (35.8 C)-98.2 F (36.8 C)] 97.8 F (36.6 C) (08/27 0400) Pulse Rate:  [78-109] 109 (08/27 0337) Resp:  [14-24] 16 (08/27 0337) BP: (83-121)/(42-57) 83/42 mmHg (08/27 0000) SpO2:  [95 %-100 %] 99 % (08/27 0337) FiO2 (%):  [30 %] 30 % (08/27 0337) Weight:  [125 lb 10.6 oz (57 kg)] 125 lb 10.6 oz (57 kg) (08/27 0500) Last BM Date: 07/06/14  Intake/Output from previous day: 08/26 0701 - 08/27 0700 In: 2040 [I.V.:50; NG/GT:65; IV Piggyback:200; TPN:1725] Out: 2375 [Urine:1625; Drains:700; Stool:50] Intake/Output this shift:    PE: Abd: soft, but with some distention, TFs going at 15CC/hr.  Colostomy with no output except some air and sweat.  Midline wound dehisced.  Eakin's pouch in place.  PEG tube in place.  Lab Results:  No results found for this basename: WBC, HGB, HCT, PLT,  in the last 72 hours BMET  Recent Labs  07/07/14 0500 07/09/14 0442  NA 138 140  K 3.9 3.2*  CL 97 97  CO2 33* 35*  GLUCOSE 160* 239*  BUN 30* 28*  CREATININE 0.44* 0.37*  CALCIUM 8.3* 8.0*   PT/INR No results found for this basename: LABPROT, INR,  in the last 72 hours CMP     Component Value Date/Time   NA 140 07/09/2014 0442   K 3.2* 07/09/2014 0442   CL 97 07/09/2014 0442   CO2 35* 07/09/2014 0442   GLUCOSE 239* 07/09/2014 0442   BUN 28* 07/09/2014 0442   CREATININE 0.37* 07/09/2014 0442   CALCIUM 8.0* 07/09/2014 0442   PROT 4.8* 07/09/2014 0442   ALBUMIN 1.8* 07/09/2014 0442   AST 14 07/09/2014 0442   ALT 11 07/09/2014 0442   ALKPHOS 70 07/09/2014 0442   BILITOT 0.3 07/09/2014 0442   GFRNONAA >90 07/09/2014 0442   GFRAA >90 07/09/2014 0442    Lipase     Component Value Date/Time   LIPASE 14 07/01/2014 1843       Studies/Results: Dg Abd Portable 1v  07/08/2014   CLINICAL DATA:  Nausea.  Poor p.o. intake.  EXAM: PORTABLE ABDOMEN - 1 VIEW  COMPARISON:  CT scan from 07/01/2014.  X-ray from 07/01/2014.  FINDINGS: Supine view of the abdomen shows gastrostomy tube overlying the midline abdomen. Gaseous bowel distension is similar to slightly decreased when comparing to the scout film from the previous CT scan and the previous abdominal x-ray. Contrast material from the previous studies is now visualized in a nondilated colon. Mild collapse/consolidation seen at the left base with associated small left pleural effusion.  IMPRESSION: Mild gaseous small bowel distension, slightly improved in the interval since the prior studies.   Electronically Signed   By: Kennith Center M.D.   On: 07/08/2014 13:32    Anti-infectives: Anti-infectives   Start     Dose/Rate Route Frequency Ordered Stop   07/07/14 1500  fluconazole (DIFLUCAN) IVPB 100 mg     100 mg 50 mL/hr over 60 Minutes Intravenous Every 24 hours 07/06/14 1414     07/06/14 1500  fluconazole (DIFLUCAN) IVPB 200 mg     200 mg 100 mL/hr over 60 Minutes Intravenous  Once 07/06/14  1414 07/06/14 1749   07/02/14 0800  metroNIDAZOLE (FLAGYL) IVPB 500 mg     500 mg 100 mL/hr over 60 Minutes Intravenous Every 8 hours 07/02/14 0054     07/02/14 0600  vancomycin (VANCOCIN) IVPB 1000 mg/200 mL premix     1,000 mg 200 mL/hr over 60 Minutes Intravenous Every 24 hours 07/02/14 0054     07/02/14 0200  ciprofloxacin (CIPRO) IVPB 400 mg     400 mg 200 mL/hr over 60 Minutes Intravenous Every 12 hours 07/02/14 0054         Assessment/Plan   Transcolonic percutaneous gastrostomy tube POD #13 s/p segmental transverse colectomy, wedge gastrectomy, open Stamm gastrostomy, End colostomy  Now with Dislodged G-tube POD #6 s/p replacement of G tube (Dr. Elnoria Howard - 07/03/14)  Wound dehiscence with bowel  visible just under the minimal layer of granulation tissue  PCM on TPN, now starting TF   Plan: 1. [name] family still wants to proceed with aggressive care, except she has been made DNR. 2. Cont current care with Eakin's pouch and wound care to inferior portion of wound exposed below Eakin's pouch. 3. TF advancement per CCM. 4. Patient is surgically stable for dc back to [name] when ok with CCM 5. Overall, the patient's prognosis is poor and her quality of life is not good. 6. Follow up is unable to occur with [place] as we can not have a vented patient return to our office for follow up.  She will have to receive any type of surgical or wound care from [name] while there.  Once discharged from there, if she is ever able, the same thought applies as we can not see a vented patient in the office.   LOS: 8 days    Patryck Kilgore E 07/09/2014, 7:46 AM Pager: [phone]

## 2014-07-09 NOTE — Progress Notes (Signed)
Noted peg tube was leaking with yellow color drainage coming out around the peg site, with residual of 170 and was reinfused , Md was paged and ordered to stop the tube feeding and page the surgery.

## 2014-07-09 NOTE — Trach Care Team (Signed)
Trach Care Progression Note   Patient Details Name: Obdulia Granquist MRN: [id number] DOB: 1941/02/07 Today's Date: 07/09/2014   Tracheostomy Assessment    Tracheostomy Shiley 6 mm Cuffed (Active)  Status Secured 07/09/2014  1:00 PM  Site Assessment Clean;Dry 07/09/2014  1:00 PM  Site Care Cleansed;Dried;Dressing applied 07/09/2014  1:00 PM  Inner Cannula Care Changed/new 07/09/2014  1:00 PM  Ties Assessment Clean;Dry;Secure 07/09/2014  1:00 PM  Cuff pressure (cm) 28 cm 07/09/2014  1:00 PM  Emergency Equipment at bedside Yes 07/09/2014  1:00 PM     Care Needs     Respiratory Therapy Tracheostomy: Chronic trach O2 Device: Ventilator FiO2 (%): 30 % SpO2: 98 % Education: Not applicable Respiratory barriers to progression:  (vent dependent)    Speech Language Pathology  SLP chart review complete: Patient does not need SLP services at this time (pt. is chronic trach/vent, came from [name] for PEG complic)   Physical Therapy      Occupational Therapy      Nutritional [name] Current Diet: Total nutrient admixture;Tube feeding Tube Feeding: Osmolite 1.2 Cal Tube Feeding Frequency: Continuous Tube Feeding Strength: Other (Comment) (15)    Case Management/Social Work Level of patient care prior to hospitalization: SNF/Assisted  living facility Insurance payer: Medicare Barriers to progression: SNF days remaining- (wound dehiscence/vomiting with restart of tube feeds.) Plan to address barriers:  (possible ethics consult? question futility-) Anticipated discharge disposition: SNF/Assisted  living facility    Provider Trach Care Team Recommendations            [facility] Team Members -  Harlon Ditty, SLP, Nicolasa Ducking, CM, and Joaquin Courts, RD     None.           Yaire Kreher, Silva Bandy (scribe for team) 07/09/2014, 1:47 PM

## 2014-07-09 NOTE — Progress Notes (Signed)
NUTRITION CONSULT/FOLLOW UP  DOCUMENTATION CODES Per approved criteria  -Severe malnutrition in the context of chronic illness   INTERVENTION:  As medically appropriate, advance Osmolite 1.2 formula to goal rate of 30 ml/hr with Prostat liquid protein 30 ml QID via tube to provide 1264 kcals, 100 gm protein, 590 ml of free water RD to follow for nutrition care plan  NUTRITION DIAGNOSIS: Inadequate oral intake related to inability to eat as evidenced by NPO status, ongoing  Goal: Pt to meet >/= 90% of their estimated nutrition needs, met  Monitor:  TF regimen & tolerance, goals of care, respiratory status, weight, labs, I/O's   ASSESSMENT: 73 year old female from [name]. Chronic vent/trach. D/c from [facility] 8/18 to [name], PEG noted to be dislodged 8/19. It was replaced at [name] but continued to have drainage and bubbling around tube. Brought to ED for evaluation. PCCM called for admission.   8/17:  [name] usual TF regimen is Isosource 1.5: 300 ml every 6 hours with free water flushes 30 ml/h to provide 1800 kcals, 82 gm protein, 1637 ml free water daily.  Patient s/p procedure 8/21: EGD WITH PEG TUBE PLACEMENT  Per GI, unable to use PEG for next 48 hours.  RD consulted for TPN initiation.  Patient is receiving TPN with Clinimix E 5/15 @ 40 ml/hr and lipids @ 10 ml/hr. Provides 1162 kcal and 48 grams protein per day. Meets 83% minimum estimated energy needs and 53% minimum estimated protein needs.  8/24:  PEG tube ready to be used.  Surgery note reviewed.  Pt c/o pain over abdomen & nausea.  RD consulted for TF initiation & management.  Will advance slowly.  Patient is currently on ventilator support -- trach MV: 5.6 L/min Temp (24hrs), Avg:98 F (36.7 C), Min:97.6 F (36.4 C), Max:98.4 F (36.9 C)   Patient is receiving TPN with Clinimix E 5/15 @ 80 ml/hr and lipids @ 10 ml/hr. Provides kcal 1843 and 96 grams protein per day. Meets 153% minimum estimated energy needs and  100% minimum estimated protein needs.  8/27:   Noted patient with significant pain/bloating in abdomen along with vomiting.  TF via PEG tube held 8/25.  Reglan added.  TF resumed last night.  Osmolite 1.2 formula currently infusing at 15 ml/hr providing 432 kcals, 20 gm protein, 295 ml of free water.  Grim prognosis.  Patient is receiving TPN with Clinimix E 5/15 @ 65 ml/hr and lipids @ 10 ml/hr.  Provides 1588 kcal and 78 grams protein per day. Meets 132% minimum estimated energy needs and 86% minimum estimated protein needs.  Height: Ht Readings from Last 1 Encounters:  07/02/14 5' 1"  (1.549 m)    Weight: Wt Readings from Last 1 Encounters:  07/09/14 125 lb 10.6 oz (57 kg)    BMI:  Body mass index is 23.76 kg/(m^2).  Re-estimated Nutritional Needs: Kcal: 1200-1400 Protein: 90-100 gm Fluid: >/= 1.5 L1  Skin: Stage IV sacral ulcer, abdominal full thickness wound with exposed bowel & drainage  Diet Order: NPO   Intake/Output Summary (Last 24 hours) at 07/09/14 1250 Last data filed at 07/09/14 1116  Gross per 24 hour  Intake   1765 ml  Output   2400 ml  Net   -635 ml    Labs:   Recent Labs Lab 07/04/14 0510  07/06/14 0500 07/07/14 0500 07/09/14 0442  NA 138  < > 139 138 140  K 3.9  < > 3.0* 3.9 3.2*  CL 97  < > 96  97 97  CO2 30  < > 35* 33* 35*  BUN 15  < > 26* 30* 28*  CREATININE 0.44*  < > 0.47* 0.44* 0.37*  CALCIUM 8.2*  < > 7.8* 8.3* 8.0*  MG 2.0  --  1.6 2.2 1.8  PHOS 3.9  --  2.8  --  2.4  GLUCOSE 267*  < > 128* 160* 239*  < > = values in this interval not displayed.  CBG (last 3)   Recent Labs  07/09/14 0445 07/09/14 0758 07/09/14 1159  GLUCAP 228* 263* 194*    Scheduled Meds: . antiseptic oral rinse  7 mL Mouth Rinse QID  . chlorhexidine  15 mL Mouth Rinse BID  . ciprofloxacin  400 mg Intravenous Q12H  . fluconazole (DIFLUCAN) IV  100 mg Intravenous Q24H  . heparin  5,000 Units Subcutaneous 3 times per day  . hydrocortisone sodium  succinate  50 mg Intravenous Q8H  . insulin aspart  0-15 Units Subcutaneous 6 times per day  . insulin glargine  5 Units Subcutaneous QHS  . metronidazole  500 mg Intravenous Q8H  . pantoprazole (PROTONIX) IV  40 mg Intravenous QHS  . potassium chloride  30 mEq Per Tube Q4H  . vancomycin  1,000 mg Intravenous Q24H    Continuous Infusions: . Marland KitchenTPN (CLINIMIX-E) Adult 75 mL/hr at 07/08/14 1741  . Marland KitchenTPN (CLINIMIX-E) Adult    . sodium chloride 10 mL/hr at 07/08/14 2234  . dextrose 5 % and 0.45 % NaCl with KCl 20 mEq/L    . feeding supplement (OSMOLITE 1.2 CAL) 1,000 mL (07/08/14 2322)    Past Medical History  Diagnosis Date  . Hypertension   . 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 Arthritis   . Anxiety   . Cachexia 12/2013  . DM type 2 (diabetes mellitus, type 2)   . 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 Anemia 12/2013  . Dementia   . Compression fracture of lumbar vertebra, non-traumatic 12/2013    L3 and L1.     Past Surgical History  Procedure Laterality Date  . Vaginal hysterectomy      ? Abdominal vs. Vaginal  . Laparotomy N/A 06/26/2014    Procedure: EXPLORATORY LAPAROTOMY;  Surgeon: Rolm Bookbinder, MD;  Location: [place];  Service: General;  Laterality: N/A;  . Partial colectomy N/A 06/26/2014    Procedure: TRANSVERSE COLECTOMY/COLOSTOMY;  Surgeon: Rolm Bookbinder, MD;  Location: [place];  Service: General;  Laterality: N/A;  . Gastrostomy N/A 06/26/2014    Procedure: NEW GASTROSTOMY TUBE;  Surgeon: Rolm Bookbinder, MD;  Location: [place];  Service: General;  Laterality: N/A;  . Peg placement N/A 07/03/2014    Procedure: PERCUTANEOUS ENDOSCOPIC GASTROSTOMY (PEG) PLACEMENT;  Surgeon: Beryle Beams, MD;  Location: [place];  Service: Endoscopy;  Laterality: N/A;    Arthur Holms, RD, LDN Pager #: [phone] After-Hours Pager #: [phone]

## 2014-07-09 NOTE — Progress Notes (Signed)
PARENTERAL NUTRITION CONSULT NOTE   Pharmacy Consult for TPN Indication: PEG tube malfunction; Intolerance to TF   Patient Measurements: Height: 5\' 1"  (154.9 cm) Weight: 125 lb 10.6 oz (57 kg) IBW/kg (Calculated) : 47.8   Vital Signs: Temp: 97.8 F (36.6 C) (08/27 0400) Temp src: Axillary (08/27 0400) BP: 90/45 mmHg (08/27 0746) Pulse Rate: 80 (08/27 0746) Intake/Output from previous day: 08/26 0701 - 08/27 0700 In: 2040 [I.V.:50; NG/GT:65; IV Piggyback:200; TPN:1725] Out: 2375 [Urine:1625; Drains:700; Stool:50] Intake/Output from this shift:    Labs: No results found for this basename: WBC, HGB, HCT, PLT, APTT, INR,  in the last 72 hours   Recent Labs  07/07/14 0500 07/09/14 0442  NA 138 140  K 3.9 3.2*  CL 97 97  CO2 33* 35*  GLUCOSE 160* 239*  BUN 30* 28*  CREATININE 0.44* 0.37*  CALCIUM 8.3* 8.0*  MG 2.2 1.8  PHOS  --  2.4  PROT  --  4.8*  ALBUMIN  --  1.8*  AST  --  14  ALT  --  11  ALKPHOS  --  70  BILITOT  --  0.3   Estimated Creatinine Clearance: 48.7 ml/min (by C-G formula based on Cr of 0.37).    Recent Labs  07/08/14 2025 07/09/14 0049 07/09/14 0445  GLUCAP 197* 204* 228*    Insulin Requirements in the past 24 hours:  14 units SSI  Current Nutrition:  NPO Clinimix E 5/15 at 75 ml/hr and 20% lipid emulsion at 10 ml/hr to Mon and Fri. TF Osmolite 1.2 at 10 ml/hr provides 13 g of protein and 288 calories.  Nutritional Goals:   1200-1400 kCal, 90-100 grams of protein per day (goal change per RD note 8/24)  Assessment: 73 year old female from [name]. Chronic vent/trach. D/c from [facility] 8/18 to [name], PEG noted to be dislodged 8/19. It was replaced at [name] but continued to have drainage and bubbling around tube. Brought to ED for evaluation.  GI: 8/5 - 8/18 > admission to [facility]  8/14: To OR for segmental transverse colectomy, wedge gastrectomy, open Stamm gastrostomy, and End colostomy  8/19: PEG dislodged, attempted to replace at  [name]. Drainage at site, to ED.  8/21: PEG replaced in ENDO by Dr Elnoria Howard. Started on TPN per [facility] recs. 8/22: No signs of peritonitis, wait at least 48 hours before starting TF. 8/23: Wound dehiscence, concerning that fistulas may develop.  No TF until 8/24 at the earliest.  8/24: TF (Osmolite 1.2) started at 13ml/hr (goal rate =30 ml/hr). 8/25: Pt actively vomiting; also with black secretions around trach site. Abd distended. TF on hold. Prealbumin up to 23.3 (wnl). 8/27: TF restarted yesterday. Although initial residul was high, pt appears to be tolerating overnight with no residuals this morning.  Endo: DM. She is on chronic steroids- currently on stress dose steroids. Had several episodes of hypoglycemia 8/23, which eventually required discontinuation of her TPN that contained insulin.  Her CBGs are trending up, likely due to addition of TF. Will target CBGs <180 for her.   Lytes: K 3.2. Corrected Ca WNL.  Renal: Scr stable, UOP 0.8 ml/kg/hr. UOP 1800 ml/24 hr. I&Os equal.   Pulm: COPD, chronic trach - on vent support.  Cards: Hx HTN. BP ok, HR normal-tachy- currently on no CV meds.  Hepatobil: LFT's WNL. Trigs 38 (elevated TG=332 on 8/22 due to labs being drawn from TPN port)  Neuro: Anxiety, dementia, lumbar compression fx.   ID: Vanco/Flagyl/Cipro (all since at least  8/4) for intraabdominal process. Diflucan added 8/24 for yeast in urine. Afebrile. WBC wnl.  Best Practices: IV PPI, Heparin SQ  TPN Access: 8/6 double-lumen PICC line  TPN day#: 8/10 >> 8/18; resumed 8/21>>  Plan:  1. Decrease Clinimix E 5/15 to 65 ml/hr and 20% lipid emulsion to 10 ml/hr on Mon and Fri only. TPN plus TF will provide daily average of 1532 calories and 91 gm of protein (meets 100% of estimated needs).  2. Continue CBGs and SSI as ordered.  Continue with no insulin in TPN. 3. KCl solution 30 mEq/tube q4h x2 doses 4. Will f/u ability to increase TF. As TF increases, will also decrease TPN by same  amount. 5. If pt continues to tolerate TF, hopefully can d/c TPN tomorrow.  Greggory Stallion, PharmD Candidate 07/09/2014  7:57 AM   I agree with above.  Christoper Fabian, PharmD, BCPS Clinical pharmacist, pager [phone] 07/09/2014  9:04 AM

## 2014-07-10 DIAGNOSIS — E1165 Type 2 diabetes mellitus with hyperglycemia: Secondary | ICD-10-CM

## 2014-07-10 DIAGNOSIS — IMO0001 Reserved for inherently not codable concepts without codable children: Secondary | ICD-10-CM

## 2014-07-10 LAB — BLOOD GAS, ARTERIAL
Acid-Base Excess: 14.4 mmol/L — ABNORMAL HIGH (ref 0.0–2.0)
BICARBONATE: 40.4 meq/L — AB (ref 20.0–24.0)
DRAWN BY: 275531
FIO2: 0.3 %
LHR: 14 {breaths}/min
MECHVT: 460 mL
O2 SAT: 92.3 %
PEEP/CPAP: 5 cmH2O
PH ART: 7.368 (ref 7.350–7.450)
Patient temperature: 98.6
TCO2: 42.6 mmol/L (ref 0–100)
pCO2 arterial: 71.9 mmHg (ref 35.0–45.0)
pO2, Arterial: 67.2 mmHg — ABNORMAL LOW (ref 80.0–100.0)

## 2014-07-10 LAB — CBC
HCT: 23.5 % — ABNORMAL LOW (ref 36.0–46.0)
Hemoglobin: 7.2 g/dL — ABNORMAL LOW (ref 12.0–15.0)
MCH: 30.5 pg (ref 26.0–34.0)
MCHC: 30.6 g/dL (ref 30.0–36.0)
MCV: 99.6 fL (ref 78.0–100.0)
Platelets: 255 10*3/uL (ref 150–400)
RBC: 2.36 MIL/uL — ABNORMAL LOW (ref 3.87–5.11)
RDW: 15.1 % (ref 11.5–15.5)
WBC: 5.6 10*3/uL (ref 4.0–10.5)

## 2014-07-10 LAB — COMPREHENSIVE METABOLIC PANEL
ALK PHOS: 73 U/L (ref 39–117)
ALT: 10 U/L (ref 0–35)
ANION GAP: 5 (ref 5–15)
AST: 15 U/L (ref 0–37)
Albumin: 1.7 g/dL — ABNORMAL LOW (ref 3.5–5.2)
BILIRUBIN TOTAL: 0.3 mg/dL (ref 0.3–1.2)
BUN: 27 mg/dL — AB (ref 6–23)
CHLORIDE: 98 meq/L (ref 96–112)
CO2: 40 meq/L — AB (ref 19–32)
CREATININE: 0.4 mg/dL — AB (ref 0.50–1.10)
Calcium: 8.3 mg/dL — ABNORMAL LOW (ref 8.4–10.5)
GFR calc Af Amer: >90 mL/min (ref >90)
GLUCOSE: 145 mg/dL — AB (ref 70–99)
Potassium: 3.7 mEq/L (ref 3.7–5.3)
Sodium: 143 mEq/L (ref 137–147)
Total Protein: 4.5 g/dL — ABNORMAL LOW (ref 6.0–8.3)

## 2014-07-10 LAB — BASIC METABOLIC PANEL
ANION GAP: 4 — AB (ref 5–15)
BUN: 26 mg/dL — ABNORMAL HIGH (ref 6–23)
CALCIUM: 8.1 mg/dL — AB (ref 8.4–10.5)
CO2: 41 mEq/L (ref 19–32)
Chloride: 99 mEq/L (ref 96–112)
Creatinine, Ser: 0.41 mg/dL — ABNORMAL LOW (ref 0.50–1.10)
GFR calc Af Amer: >90 mL/min (ref >90)
GFR calc non Af Amer: >90 mL/min (ref >90)
Glucose, Bld: 213 mg/dL — ABNORMAL HIGH (ref 70–99)
Potassium: 2.9 mEq/L — CL (ref 3.7–5.3)
Sodium: 144 mEq/L (ref 137–147)

## 2014-07-10 LAB — CK: Total CK: 23 U/L (ref 7–177)

## 2014-07-10 LAB — MAGNESIUM: Magnesium: 2 mg/dL (ref 1.5–2.5)

## 2014-07-10 LAB — GLUCOSE, CAPILLARY
GLUCOSE-CAPILLARY: 144 mg/dL — AB (ref 70–99)
GLUCOSE-CAPILLARY: 211 mg/dL — AB (ref 70–99)
Glucose-Capillary: 193 mg/dL — ABNORMAL HIGH (ref 70–99)
Glucose-Capillary: 253 mg/dL — ABNORMAL HIGH (ref 70–99)
Glucose-Capillary: 132 mg/dL — ABNORMAL HIGH (ref 70–99)
Glucose-Capillary: 139 mg/dL — ABNORMAL HIGH (ref 70–99)

## 2014-07-10 LAB — PHOSPHORUS: PHOSPHORUS: 1.8 mg/dL — AB (ref 2.3–4.6)

## 2014-07-10 MED ORDER — SODIUM CHLORIDE 0.9 % IV SOLN
0.5000 mg/h | INTRAVENOUS | Status: DC
  Administered 2014-07-10: 0.5 mg/h via INTRAVENOUS
  Filled 2014-07-10: qty 5

## 2014-07-10 MED ORDER — OXYCODONE HCL 20 MG/ML PO CONC
5.0000 mg | ORAL | Status: DC | PRN
Start: 2014-07-10 — End: 2014-07-15
  Administered 2014-07-14 – 2014-07-15 (×5): 5 mg via ORAL
  Filled 2014-07-10 (×6): qty 1

## 2014-07-10 MED ORDER — POTASSIUM CHLORIDE 10 MEQ/50ML IV SOLN
10.0000 meq | INTRAVENOUS | Status: AC
  Administered 2014-07-10 (×4): 10 meq via INTRAVENOUS
  Filled 2014-07-10 (×3): qty 50

## 2014-07-10 MED ORDER — FAT EMULSION 20 % IV EMUL
250.0000 mL | INTRAVENOUS | Status: AC
  Administered 2014-07-10: 250 mL via INTRAVENOUS
  Filled 2014-07-10: qty 250

## 2014-07-10 MED ORDER — MAGNESIUM SULFATE 40 MG/ML IJ SOLN
2.0000 g | Freq: Once | INTRAMUSCULAR | Status: AC
  Administered 2014-07-10: 2 g via INTRAVENOUS
  Filled 2014-07-10: qty 50

## 2014-07-10 MED ORDER — POTASSIUM CHLORIDE 10 MEQ/100ML IV SOLN: 10.0000 meq | INTRAVENOUS | Status: DC

## 2014-07-10 MED ORDER — TRACE MINERALS CR-CU-F-FE-I-MN-MO-SE-ZN IV SOLN
INTRAVENOUS | Status: AC
  Administered 2014-07-10: 17:00:00 via INTRAVENOUS
  Filled 2014-07-10: qty 2000

## 2014-07-10 MED ORDER — POTASSIUM CHLORIDE 10 MEQ/50ML IV SOLN
10.0000 meq | INTRAVENOUS | Status: AC
  Filled 2014-07-10: qty 50

## 2014-07-10 MED ORDER — WHITE PETROLATUM GEL
Status: AC
  Administered 2014-07-10: 08:00:00
  Filled 2014-07-10: qty 5

## 2014-07-10 NOTE — Progress Notes (Signed)
Patient ID: Katelyn Rojas, female   DOB: 1941/01/04, 73 y.o.   MRN: [id number] 7 Days Post-Op  Subjective: Pt spoke over her vent for the first time at me today to express how much pain she was in.  I asked if she wanted to continue living like this.  She shook her head no.  RN reports patient is not tolerating her TFs and is having more leakage from around her PEG tube.  Objective: Vital signs in last 24 hours: Temp:  [98.4 F (36.9 C)-98.7 F (37.1 C)] 98.7 F (37.1 C) (08/28 0753) Pulse Rate:  [80-130] 80 (08/28 0800) Resp:  [12-24] 16 (08/28 0800) BP: (80-138)/(48-69) 105/48 mmHg (08/28 0800) SpO2:  [92 %-99 %] 93 % (08/28 0800) FiO2 (%):  [30 %] 30 % (08/28 0753) Weight:  [126 lb 12.2 oz (57.5 kg)] 126 lb 12.2 oz (57.5 kg) (08/28 0500) Last BM Date: 07/09/14  Intake/Output from previous day: 08/27 0701 - 08/28 0700 In: 1725 [I.V.:110; NG/GT:150; IV Piggyback:650; TPN:815] Out: 1530 [Urine:1425; Drains:40; Stool:65] Intake/Output this shift:    PE: Abd: soft, but tender to palpation diffusely, minimal BS, abdominal wound with fibrin slough and a small amount of granular tissue covering exposed bowel with dehiscence. PEG tube in place with no current leakage as no TFs running   Lab Results:   Recent Labs  07/10/14 0600  WBC 5.6  HGB 7.2*  HCT 23.5*  PLT 255   BMET  Recent Labs  07/09/14 0442 07/10/14 0600  NA 140 144  K 3.2* 2.9*  CL 97 99  CO2 35* 41*  GLUCOSE 239* 213*  BUN 28* 26*  CREATININE 0.37* 0.41*  CALCIUM 8.0* 8.1*   PT/INR No results found for this basename: LABPROT, INR,  in the last 72 hours CMP     Component Value Date/Time   NA 144 07/10/2014 0600   K 2.9* 07/10/2014 0600   CL 99 07/10/2014 0600   CO2 41* 07/10/2014 0600   GLUCOSE 213* 07/10/2014 0600   BUN 26* 07/10/2014 0600   CREATININE 0.41* 07/10/2014 0600   CALCIUM 8.1* 07/10/2014 0600   PROT 4.8* 07/09/2014 0442   ALBUMIN 1.8* 07/09/2014 0442   AST 14 07/09/2014 0442   ALT 11  07/09/2014 0442   ALKPHOS 70 07/09/2014 0442   BILITOT 0.3 07/09/2014 0442   GFRNONAA >90 07/10/2014 0600   GFRAA >90 07/10/2014 0600   Lipase     Component Value Date/Time   LIPASE 14 07/01/2014 1843       Studies/Results: Dg Abd Portable 1v  07/08/2014   CLINICAL DATA:  Nausea.  Poor p.o. intake.  EXAM: PORTABLE ABDOMEN - 1 VIEW  COMPARISON:  CT scan from 07/01/2014.  X-ray from 07/01/2014.  FINDINGS: Supine view of the abdomen shows gastrostomy tube overlying the midline abdomen. Gaseous bowel distension is similar to slightly decreased when comparing to the scout film from the previous CT scan and the previous abdominal x-ray. Contrast material from the previous studies is now visualized in a nondilated colon. Mild collapse/consolidation seen at the left base with associated small left pleural effusion.  IMPRESSION: Mild gaseous small bowel distension, slightly improved in the interval since the prior studies.   Electronically Signed   By: Kennith Center M.D.   On: 07/08/2014 13:32    Anti-infectives: Anti-infectives   Start     Dose/Rate Route Frequency Ordered Stop   07/07/14 1500  fluconazole (DIFLUCAN) IVPB 100 mg     100 mg 50 mL/hr over  60 Minutes Intravenous Every 24 hours 07/06/14 1414     07/06/14 1500  fluconazole (DIFLUCAN) IVPB 200 mg     200 mg 100 mL/hr over 60 Minutes Intravenous  Once 07/06/14 1414 07/06/14 1749   07/02/14 0800  metroNIDAZOLE (FLAGYL) IVPB 500 mg     500 mg 100 mL/hr over 60 Minutes Intravenous Every 8 hours 07/02/14 0054     07/02/14 0600  vancomycin (VANCOCIN) IVPB 1000 mg/200 mL premix     1,000 mg 200 mL/hr over 60 Minutes Intravenous Every 24 hours 07/02/14 0054     07/02/14 0200  ciprofloxacin (CIPRO) IVPB 400 mg     400 mg 200 mL/hr over 60 Minutes Intravenous Every 12 hours 07/02/14 0054         Assessment/Plan   Transcolonic percutaneous gastrostomy tube POD #14 s/p segmental transverse colectomy, wedge gastrectomy, open Stamm  gastrostomy, End colostomy  Now with Dislodged G-tube POD #7 s/p replacement of G tube (Dr. Elnoria Howard - 07/03/14)  Wound dehiscence with bowel visible just under the minimal layer of granulation tissue  PCM on TPN, TF on hold  Plan: 1. Patient is not tolerating her TFs.  These have been held.  Patient is only on TNA right now. 2. Her wound continues to spread.  Can no longer place Eakin's pouch over wound.  D/W Alvis Lemmings, WOC, RN.  Will start mepilex and the WD dressing changes to wound BID.  Patient has minimal tissue keeping her from eviscerating.  I am concerned that this may eventually happen. 3. I have had a long discussion with the primary team about this patient and my concerns for her overall status.  The patient is hypotensive this morning, which is sometimes her norm, but she has some deranged electrolytes as well.  I am concerned given her intolerance to TFs and continued abdominal pain, that the patient is not going to be able to tolerate enteral feeds.  I'm not sure she is a good candidate for sole reliance on TNA given her overall status and poor prognosis.  She also told me no this morning that she didn't want to continue on in this way.  With that said, I'm not sure of the patient's comprehensive of her situation and how well she understands all of this.  She did have enough pain though, that she spoke over the vent to demand pain medication this morning.  Dr. Joseph Art is trying to contact the family to set up a meeting tomorrow with [name], the son and POA, to once again address the patient's status.  Her prognosis is very poor and I'm do not think we will be able to get the patient better.   LOS: 9 days    Ardit Danh E 07/10/2014, 8:30 AM Pager: [phone]

## 2014-07-10 NOTE — Progress Notes (Signed)
Wound is opening further but no evisceration. Should she eviscerate, I do not feel she is a surgical candidate. Patient examined and I agree with the assessment and plan  Violeta Gelinas, MD, MPH, FACS Trauma: [phone] General Surgery: [phone]  07/10/2014 12:15 PM

## 2014-07-10 NOTE — Progress Notes (Addendum)
Pt Bp dropping after started on Dilaudid drip, BP was 82/43. Md Notified, and ordered Dilaudid to be Discontinued. Bp now 89/54, pt is alert to pain and voice. Will continue to monitor. Once Dilaudid Dc'd, wasted 22ml with [name], Charity fundraiser. [name] V, RN 07/10/2014 1:16 PM

## 2014-07-10 NOTE — Progress Notes (Signed)
PARENTERAL NUTRITION CONSULT NOTE   Pharmacy Consult for TPN Indication: PEG tube malfunction; Intolerance to TF   Patient Measurements: Height: 5\' 1"  (154.9 cm) Weight: 126 lb 12.2 oz (57.5 kg) IBW/kg (Calculated) : 47.8   Vital Signs: Temp: 98.6 F (37 C) (08/28 0400) Temp src: Axillary (08/28 0400) BP: 80/52 mmHg (08/28 0400) Pulse Rate: 90 (08/28 0400) Intake/Output from previous day: 08/27 0701 - 08/28 0700 In: 1725 [I.V.:110; NG/GT:150; IV Piggyback:650; TPN:815] Out: 1530 [Urine:1425; Drains:40; Stool:65] Intake/Output from this shift:    Labs:  Recent Labs  07/10/14 0600  WBC 5.6  HGB 7.2*  HCT 23.5*  PLT 255     Recent Labs  07/09/14 0442 07/10/14 0600  NA 140 144  K 3.2* 2.9*  CL 97 99  CO2 35* 41*  GLUCOSE 239* 213*  BUN 28* 26*  CREATININE 0.37* 0.41*  CALCIUM 8.0* 8.1*  MG 1.8  --   PHOS 2.4  --   PROT 4.8*  --   ALBUMIN 1.8*  --   AST 14  --   ALT 11  --   ALKPHOS 70  --   BILITOT 0.3  --    Estimated Creatinine Clearance: 52.6 ml/min (by C-G formula based on Cr of 0.41).    Recent Labs  07/09/14 2048 07/10/14 0105 07/10/14 0439  GLUCAP 176* 193* 253*    Insulin Requirements in the past 24 hours:  25 units SSI; Lantus 5 units qHS  Current Nutrition:  NPO Clinimix E 5/15 at 65 ml/hr and 20% lipid emulsion at 10 ml/hr Mon and Fri provides daily average of 1245 kcal and 78 gm protein. (TF now off ) Goal (TPN alone): Clinimix E 5/15 at 75 ml/hr and 20% lipid emulsion at 59ml/hr Mon and Fri provides daily average of 1415 kcal and 90 gm protein.  Nutritional Goals:   1200-1400 kCal, 90-100 grams of protein per day (goal change per RD note 8/24)  Assessment: 73 year old female from [name]. Chronic vent/trach. D/c from [facility] 8/18 to [name], PEG noted to be dislodged 8/19. It was replaced at [name] but continued to have drainage and bubbling around tube. Brought to ED for evaluation.  GI: 8/5 - 8/18 > admission to [facility]  8/14: To  OR for segmental transverse colectomy, wedge gastrectomy, open Stamm gastrostomy, and End colostomy  8/19: PEG dislodged, attempted to replace at [name]. Drainage at site, to ED.  8/21: PEG replaced in ENDO by Dr Elnoria Howard. Started on TPN per [facility] recs. 8/22: No signs of peritonitis, wait at least 48 hours before starting TF. 8/23: Wound dehiscence, concerning that fistulas may develop.  No TF until 8/24 at the earliest.  8/24: TF (Osmolite 1.2) started at 68ml/hr (goal rate =30 ml/hr). 8/25: Pt actively vomiting; also with black secretions around trach site. Abd distended. TF on hold. Prealbumin up to 23.3 (wnl). 8/26: TF restarted yesterday.  8/27: Although initial residual high, pt tolerating overnight with no residuals this morning. 8/28: PEG tube noted to be leaking so TF stopped.  Endo: DM. She is on chronic steroids- currently tapering stress dose steroids. Had several episodes of hypoglycemia 8/23, which eventually required discontinuation of her TPN that contained insulin.  Her CBGs continue upward trend - lantus added 8/27. Will target CBGs <180 to avoid repeat hypoglcemia.   Lytes: K 2.9 (MD replaced with IV). Corrected Ca WNL.  Renal: Scr stable, UOP 1 ml/kg/hr. I&Os ~equal.   Pulm: COPD, chronic trach - remains on vent support.  Cards: Hx HTN. BP ok, HR normal-tachy- currently on no CV meds.  Hepatobil: LFT's WNL. Trigs 38 (elevated TG=332 on 8/22 due to labs being drawn from TPN port)  Neuro: Anxiety, dementia, lumbar compression fx.   ID: Vanco/Flagyl/Cipro (all since at least 8/4) for intraabdominal process. Diflucan added 8/24 for yeast in urine. Afebrile. WBC wnl.  Best Practices: IV PPI, Heparin SQ  TPN Access: 8/6 double-lumen PICC line  TPN day#: 8/10 >> 8/18; resumed 8/21>>  Plan:  1. Increase Clinimix E 5/15 to 75 ml/hr and continue 20% lipid emulsion 10 ml/hr on Mon and Fri only. This is goal TPN rate which meets 100% of patient's estimated needs.  2.  Continue CBGs, SSI, and Lantus as ordered. Will add small amount of insulin back to TPN bag (pt with receive ~9 units regular insulin daily). 3. Give an additional KCl 71mEq/50ml x 2 (total of 6 runs today) 4. Magnesium sulfate 2gm IV once 5. BMET and Mg in a.m.  Christoper Fabian, PharmD, BCPS Clinical pharmacist, pager [phone] 07/10/2014  7:41 AM

## 2014-07-10 NOTE — Progress Notes (Signed)
CRITICAL VALUE ALERT  Critical value received:  Potassium 2.9 and C02 41    Date of notification:  07/10/14  Time of notification:  07:25  Critical value read back:Yes.    Nurse who received alert:  Inetta Fermo  MD notified (1st page):  [place]  Time of first page:  07:25  MD notified (2nd page):  Time of second page:  Responding MD:  Revonda Standard, NP Time MD responded:  07:28

## 2014-07-10 NOTE — Progress Notes (Signed)
[facility]  Liela Rylee ZOX:[id number] DOB: Oct 10, 1941 DOA: 07/01/2014 PCP: Kirt Boys, MD   Brief narrative: 73 year old female patient with a prior history of PEA arrest. Because of underlying severe COPD she has become chronically dependent on the ventilator. At some point she had a PEG tube placed by interventional radiologist and was discharged to an LTAC. On 06/17/2014 she was admitted from the LTAC due to concerns of stool draining from the PEG tube. After evaluation it was proven she had a trans-colonic gastrostomy tube. She later underwent extensive surgical repair that included creation of right colostomy and placement of a new open gastrostomy tube and was subsequently discharged back to LTAC on 06/30/2014. She also has a history of MRSA and enterococcus bacteremia and prior tracheitis with cultures positive for Alcaligenes bacteria.  She was sent back to [facility] on 07/01/2014 due to concerns of dislodgment of the recently placed gastrostomy tube. Prior to presentation to the emergency department there were several attempts to replace the tube that were unsuccessful. In the emergency department the previous tube was completely removed by the surgeon and a new tube was placed. Placement was checked via CT scan and unfortunately the replaced tube was not in proper position and it was subsequently removed. Since the stomach was sutured to the anterior abdominal wall it was felt that endoscopic placement of feeding tube would be safest approach. Gastroenterology was consulted and on 07/03/2014 patient underwent successful PEG tube placement. In the interim patient's wound subsequently dehisced and based on appearance (bowel fixed to abdominal wall) and clinical exam it was determined that closing the wound in the OR could not be safely accomplished therefore the wound was covered with an Eakin's pouch. Attempts had been made to initiate tube  feedings but patient experienced nausea and vomiting so the gastric tube was placed to gravity. Overall prognosis poor and the critical care physician had an extensive conversation with the patient's family on 07/08/2014. They did agree to a DO NOT RESUSCITATE but otherwise planned to continue aggressive care. Subsequently trickle tube feedings were resumed with the addition of Reglan  HPI/Subjective: Patient alert and continues to complain of pain, request lights and room be turned off  Assessment/Plan: Active Problems:   Chronic respiratory failure/Dependent on ventilator/Trach/ severe COPD  -Not appropriate to attempt ventilator weaning at this time -continue supportive care-today patient with hypercarbia PCO2 greater than 70 with a normal pH consistent with a compensated mixed metabolic and respiratory acidosis-RT has adjusted ventilator settings    Chronic hypotension -Systolic blood pressure varies between 80s to low 100s-perfusing adequately as evidenced by appropriate urinary output-remains on stress dose steroids so will taper slowly-no cortisol level was obtained    PEG tube malfunction/Colon perforation/Open wound of abdomen -Per general surgery-thus far tolerating trickle tube feeding-patient endorsing inadequate pain control so we made an attempt to utilize low-dose oxy IR per tube but since unable to tolerate tube feedings due to leakage and increased residuals this medication was discontinued-attempted IV Dilaudid drip at 0.5 mg per hour but blood pressure dropped to 79 systolic so this was discontinued and instead began concentrated morphine by mouth with 5 mg /0.25 cc continue Reglan-continue TNA -plan family meeting Saturday 8/29 with Dr. Joseph Art given poor prognosis and extremely low likelihood that current process is survivable ie Hospice vs comfort care?      Edema of right upper extremity -PICC in same arm-venous duplex preliminary result with no DVT  Type II DM -CBGs  variably controlled with a short-term increase during tube feedings and also likely influenced by the fact she remains on stress dose steroids-began low dose long-acting insulin and adjusted sliding scale and now CBGs much better controlled    Anemia, chronic disease -Hemoglobin stable around 8-8.5    Hypokalemia -Replete via TNA-if continues to tolerate tube feeding consider replete per tube when necessary    Protein-calorie malnutrition, severe/ On TNA with trickle tube feeding -Did not tolerate trickle tube feedings-continue TNA    Chronic dementia    Sacral decubitus ulcer, stage III     DVT prophylaxis: Subcutaneous heparin Code Status: DO NOT RESUSCITATE but otherwise continue aggressive care Family Communication: No family at bedside-8/28 called brother [name] power of attorney as well as niece [name] to leave messages regarding desire to have family meeting on 8/29 Disposition Plan/Expected LOS: Stepdown due to chronic ventilator dependence   Consultants: Critical care medicine Gen. Surgery Gastroenterology  Cultures: 8/19 urine culture > 100,000 colonies yeast 8/24 blood cultures x2  NGTD 7/6 respiratory culture: ABUNDANT ALCALIGENES XYLOSOXIDANS and MRSA 6/18 blood cultures x2 with pan sensitive enterococcus    Antibiotics: Cipro 8/5 >>> Flagyl 8/5 >>> Vancomycin 8/5 >>> Fluconazole 8/25 >>>  Objective: Blood pressure 89/54, pulse 93, temperature 98.6 F (37 C), temperature source Axillary, resp. rate 16, height  (1.549 m), weight 126 lb 12.2 oz (57.5 kg), SpO2 91.00%.  Intake/Output Summary (Last 24 hours) at 07/10/14 1420 Last data filed at 07/10/14 1300  Gross per 24 hour  Intake    975 ml  Output   1805 ml  Net   -830 ml     Exam: Gen: No acute respiratory distress-alert and able to phonate around trach and mouth words appropriately Chest: Coarse to auscultation bilaterally without wheezes, rhonchi or crackles, tracheostomy to  ventilator Cardiac: Regular occasionally tachycardic rate and rhythm, S1-S2, no rubs murmurs or gallops, no peripheral edema, no JVD-diffuse edema consistent with anasarca Abdomen: Soft and diffusely tender, right colostomy stoma pink and edematous without any stool -only serous drainage; midline abdominal wound open with exposed omentum and bowel with creamy drainage covered by Kathrine Cords pouch and per my exam as well as wound care nurse new areas of evisceration are appearing, gastrostomy tube to straight drain with green bilious returns small amount Extremities: Asymmetrical in appearance without cyanosis, clubbing or effusion-noted diffuse peripheral edema right arm greater than left Other: TNA infusing via right upper extremity PICC line  Scheduled Meds:  Scheduled Meds: . antiseptic oral rinse  7 mL Mouth Rinse QID  . chlorhexidine  15 mL Mouth Rinse BID  . ciprofloxacin  400 mg Intravenous Q12H  . feeding supplement (OSMOLITE 1.2 CAL)  1,000 mL Per Tube Q24H  . fluconazole (DIFLUCAN) IV  100 mg Intravenous Q24H  . heparin  5,000 Units Subcutaneous 3 times per day  . hydrocortisone sodium succinate  50 mg Intravenous Q8H  . insulin aspart  0-15 Units Subcutaneous 6 times per day  . insulin glargine  5 Units Subcutaneous QHS  . metronidazole  500 mg Intravenous Q8H  . pantoprazole (PROTONIX) IV  40 mg Intravenous QHS  . vancomycin  1,000 mg Intravenous Q24H   Continuous Infusions: . Marland KitchenTPN (CLINIMIX-E) Adult 65 mL/hr at 07/09/14 1738  . sodium chloride 10 mL/hr at 07/08/14 2234  . dextrose 5 % and 0.45 % NaCl with KCl 20 mEq/L    . Marland KitchenTPN (CLINIMIX-E) Adult     And  . fat emulsion  Data Reviewed: Basic Metabolic Panel:  Recent Labs Lab 07/04/14 0510 07/05/14 0655 07/06/14 0500 07/07/14 0500 07/09/14 0442 07/10/14 0600  NA 138 133* 139 138 140 144  K 3.9 4.5 3.0* 3.9 3.2* 2.9*  CL 97 94* 96 97 97 99  CO2 30 31 35* 33* 35* 41*  GLUCOSE 267* 627* 128* 160* 239* 213*  BUN 15  21 26* 30* 28* 26*  CREATININE 0.44* 0.52 0.47* 0.44* 0.37* 0.41*  CALCIUM 8.2* 8.0* 7.8* 8.3* 8.0* 8.1*  MG 2.0  --  1.6 2.2 1.8  --   PHOS 3.9  --  2.8  --  2.4  --    Liver Function Tests:  Recent Labs Lab 07/04/14 0510 07/05/14 0655 07/06/14 0500 07/09/14 0442  AST ALT ALKPHOS 73 71 70 70  BILITOT 0.3 0.2* 0.3 0.3  PROT 5.0* 4.9* 4.8* 4.8*  ALBUMIN 2.1* 2.0* 2.1* 1.8*   No results found for this basename: LIPASE, AMYLASE,  in the last 168 hours No results found for this basename: AMMONIA,  in the last 168 hours CBC:  Recent Labs Lab 07/04/14 0510 07/06/14 0500 07/10/14 0600  WBC 11.1* 10.4 5.6  NEUTROABS 9.9* 9.7*  --   HGB 8.9* 8.1* 7.2*  HCT 27.4* 24.8* 23.5*  MCV 96.8 95.4 99.6  PLT 325 274 255   Cardiac Enzymes: No results found for this basename: CKTOTAL, CKMB, CKMBINDEX, TROPONINI,  in the last 168 hours BNP (last 3 results)  Recent Labs  12/26/13 1130 01/31/14 1540 02/03/14 1535  PROBNP 130.4* 155.9* 129.5*   CBG:  Recent Labs Lab 07/09/14 2048 07/10/14 0105 07/10/14 0439 07/10/14 0748 07/10/14 1128  GLUCAP 176* 193* 253* 144* 132*    Recent Results (from the past 240 hour(s))  URINE CULTURE     Status: None   Collection Time    07/01/14  9:15 PM      Result Value Ref Range Status   Specimen Description URINE, CATHETERIZED   Final   Special Requests NONE   Final   Culture  Setup Time     Final   Value: 07/02/2014 09:02     Performed at [facility] Count     Final   Value: >=100,000 COLONIES/ML     Performed at [facility]   Culture     Final   Value: YEAST     Performed at [facility]   Report Status 07/03/2014 FINAL   Final  CULTURE, BLOOD (ROUTINE X 2)     Status: None   Collection Time    07/06/14  3:45 PM      Result Value Ref Range Status   Specimen Description BLOOD RIGHT HAND   Final   Special Requests BOTTLES DRAWN AEROBIC ONLY 8CC   Final   Culture   Setup Time     Final   Value: 07/06/2014 21:10     Performed at [facility]   Culture     Final   Value:        BLOOD CULTURE RECEIVED NO GROWTH TO DATE CULTURE WILL BE HELD FOR 5 DAYS BEFORE ISSUING A FINAL NEGATIVE REPORT     Performed at [facility]   Report Status PENDING   Incomplete  CULTURE, BLOOD (ROUTINE X 2)     Status: None   Collection Time    07/06/14  4:05 PM      Result  Value Ref Range Status   Specimen Description BLOOD LEFT HAND   Final   Special Requests BOTTLES DRAWN AEROBIC AND ANAEROBIC 5CC   Final   Culture  Setup Time     Final   Value: 07/06/2014 21:10     Performed at [facility]   Culture     Final   Value:        BLOOD CULTURE RECEIVED NO GROWTH TO DATE CULTURE WILL BE HELD FOR 5 DAYS BEFORE ISSUING A FINAL NEGATIVE REPORT     Performed at [facility]   Report Status PENDING   Incomplete     Studies:  Recent x-ray studies have been reviewed in detail by the Attending Physician  Time spent :      Junious Silk, ANP [facility] Office  [phone] Pager [phone]   **If unable to reach the above provider after paging please contact the Flow Manager @ [phone]  On-Call/Text Page:      [email or web address]      password [email or web address]  If 7PM-7AM, please contact night-coverage [email or web address] Password [email or web address] 07/10/2014, 2:20 PM   LOS: 9 days  Exam patient and discussed assessment and plan with ANP Revonda Standard. Range for family meeting to discuss goals of care for 8/29. Patient with multiple complex medical problems> used in direct patient care

## 2014-07-10 NOTE — Progress Notes (Signed)
MEDICATION RELATED CONSULT NOTE   Pharmacy Consult for Conversion from prn Fentanyl to Dilaudid drip Indication: pain control  Allergies  Allergen Reactions  . Namenda [Memantine Hcl] Other (See Comments)    "hallucinations"   . Codeine Other (See Comments)    Unknown. Listed on [facility]  . Penicillins Other (See Comments)    Unknown; listed on [facility]    Patient Measurements: Height: 5\' 1"  (154.9 cm) Weight: 126 lb 12.2 oz (57.5 kg) IBW/kg (Calculated) : 47.8  Labs:  Recent Labs  07/09/14 0442 07/10/14 0600  WBC  --  5.6  HGB  --  7.2*  HCT  --  23.5*  PLT  --  255  CREATININE 0.37* 0.41*  MG 1.8  --   PHOS 2.4  --   ALBUMIN 1.8*  --   PROT 4.8*  --   AST 14  --   ALT 11  --   ALKPHOS 70  --   BILITOT 0.3  --    Estimated Creatinine Clearance: 52.6 ml/min (by C-G formula based on Cr of 0.41).   Assessment:   Asked to assist with conversion from Fentanyl IV prn to Dilaudid infusion.  Used 800 mcg IV Fentanyl in ~24 hrs. Converts to  ~ 9 mg of Dilaudid per 24 hrs => 0.4 mg/hr, with ~25% dose reduction for cross-tolerance.  Discussed with Weston Settle, NP.  Dilaudid drip to begin at 0.5 mg/hr.  Adjustments per hospitalist team.  Goal of Therapy:   pain control  Plan:   Dilaudid drip just begin at 0.5 mg/hr.  Rate adjustments per hospitalist team.  Dennie Fetters, RPh Pager: [phone] 07/10/2014,11:31 AM

## 2014-07-10 NOTE — Consult Note (Addendum)
WOC follow-up: Removed Eakin pouch to assess abd wound.  It has increased in size since previous assessment and previously 2 wounds are now connected to one full thickness site to abd midline; 17X6.5X.2cm. with bowel visible under skin level. This is too large for use of the Eakin pouch.  Discussed plan of care with CCS team.  Will protect skin with Mepitel contact layer and bedside nurses can change dressings BID over the site.  Colostomy pouch was changed last night; kayara pouch intact with good seal and no stool or flatus at this time.  Supplies at bedside for staff nurse use.   Cammie Mcgee MSN, RN, CWOCN, [place], CNS [phone]

## 2014-07-11 DIAGNOSIS — E873 Alkalosis: Secondary | ICD-10-CM

## 2014-07-11 DIAGNOSIS — I10 Essential (primary) hypertension: Secondary | ICD-10-CM

## 2014-07-11 DIAGNOSIS — Z515 Encounter for palliative care: Secondary | ICD-10-CM

## 2014-07-11 LAB — BASIC METABOLIC PANEL
ANION GAP: 5 (ref 5–15)
BUN: 29 mg/dL — ABNORMAL HIGH (ref 6–23)
CALCIUM: 8.3 mg/dL — AB (ref 8.4–10.5)
CO2: 39 mEq/L — ABNORMAL HIGH (ref 19–32)
Chloride: 98 mEq/L (ref 96–112)
Creatinine, Ser: 0.43 mg/dL — ABNORMAL LOW (ref 0.50–1.10)
Glucose, Bld: 105 mg/dL — ABNORMAL HIGH (ref 70–99)
Potassium: 3.5 mEq/L — ABNORMAL LOW (ref 3.7–5.3)
Sodium: 142 mEq/L (ref 137–147)

## 2014-07-11 LAB — PHOSPHORUS: Phosphorus: 2.6 mg/dL (ref 2.3–4.6)

## 2014-07-11 LAB — BLOOD GAS, ARTERIAL
Acid-Base Excess: 13.4 mmol/L — ABNORMAL HIGH (ref 0.0–2.0)
Bicarbonate: 37.3 mEq/L — ABNORMAL HIGH (ref 20.0–24.0)
FIO2: 0.4 %
MECHVT: 460 mL
O2 SAT: 98.7 %
PATIENT TEMPERATURE: 98.6
PEEP/CPAP: 5 cmH2O
RATE: 18 resp/min
TCO2: 38.6 mmol/L (ref 0–100)
pCO2 arterial: 44.4 mmHg (ref 35.0–45.0)
pH, Arterial: 7.534 — ABNORMAL HIGH (ref 7.350–7.450)
pO2, Arterial: 93.8 mmHg (ref 80.0–100.0)

## 2014-07-11 LAB — GLUCOSE, CAPILLARY
GLUCOSE-CAPILLARY: 106 mg/dL — AB (ref 70–99)
Glucose-Capillary: 124 mg/dL — ABNORMAL HIGH (ref 70–99)
Glucose-Capillary: 132 mg/dL — ABNORMAL HIGH (ref 70–99)
Glucose-Capillary: 143 mg/dL — ABNORMAL HIGH (ref 70–99)
Glucose-Capillary: 144 mg/dL — ABNORMAL HIGH (ref 70–99)

## 2014-07-11 LAB — LACTIC ACID, PLASMA: Lactic Acid, Venous: 1.6 mmol/L (ref 0.5–2.2)

## 2014-07-11 LAB — MAGNESIUM: Magnesium: 2.1 mg/dL (ref 1.5–2.5)

## 2014-07-11 MED ORDER — SODIUM CHLORIDE 0.9 % IV BOLUS (SEPSIS)
1000.0000 mL | Freq: Once | INTRAVENOUS | Status: AC
  Administered 2014-07-11: 1000 mL via INTRAVENOUS

## 2014-07-11 MED ORDER — ALBUMIN HUMAN 25 % IV SOLN
50.0000 g | Freq: Once | INTRAVENOUS | Status: AC
  Administered 2014-07-11: 50 g via INTRAVENOUS
  Filled 2014-07-11: qty 200

## 2014-07-11 MED ORDER — TRACE MINERALS CR-CU-F-FE-I-MN-MO-SE-ZN IV SOLN
INTRAVENOUS | Status: AC
  Administered 2014-07-11: 18:00:00 via INTRAVENOUS
  Filled 2014-07-11: qty 2000

## 2014-07-11 MED ORDER — POTASSIUM CHLORIDE 10 MEQ/50ML IV SOLN
10.0000 meq | INTRAVENOUS | Status: AC
  Administered 2014-07-11 (×3): 10 meq via INTRAVENOUS
  Filled 2014-07-11 (×3): qty 50

## 2014-07-11 MED ORDER — ACETAZOLAMIDE SODIUM 500 MG IJ SOLR
100.0000 mg | Freq: Two times a day (BID) | INTRAMUSCULAR | Status: DC
  Administered 2014-07-11 – 2014-07-17 (×12): 100 mg via INTRAVENOUS
  Filled 2014-07-11 (×14): qty 500

## 2014-07-11 NOTE — Progress Notes (Signed)
[name] TEAM 1 - Stepdown/ICU TEAM Progress Note  Katelyn Rojas ZOX:[id number] DOB: 14-May-1941 DOA: 07/01/2014 PCP: Kirt Boys, MD  Admit HPI / Brief Narrative: 73 year old WF PMHx PEA arrest. Because of underlying severe COPD she has become chronically dependent on the ventilator. At some point she had a PEG tube placed by interventional radiologist and was discharged to an LTAC. On 06/17/2014 she was admitted from the LTAC due to concerns of stool draining from the PEG tube. After evaluation it was proven she had a trans-colonic gastrostomy tube. She later underwent extensive surgical repair that included creation of right colostomy and placement of a new open gastrostomy tube and was subsequently discharged back to LTAC on 06/30/2014. She also has a history of MRSA and enterococcus bacteremia and prior tracheitis with cultures positive for Alcaligenes bacteria.  She was sent back to [facility] on 07/01/2014 due to concerns of dislodgment of the recently placed gastrostomy tube. Prior to presentation to the emergency department there were several attempts to replace the tube that were unsuccessful. In the emergency department the previous tube was completely removed by the surgeon and a new tube was placed. Placement was checked via CT scan and unfortunately the replaced tube was not in proper position and it was subsequently removed. Since the stomach was sutured to the anterior abdominal wall it was felt that endoscopic placement of feeding tube would be safest approach. Gastroenterology was consulted and on 07/03/2014 patient underwent successful PEG tube placement. In the interim patient's wound subsequently dehisced and based on appearance (bowel fixed to abdominal wall) and clinical exam it was determined that closing the wound in the OR could not be safely accomplished therefore the wound was covered with an Eakin's pouch. Attempts had been made to initiate tube feedings but patient  experienced nausea and vomiting so the gastric tube was placed to gravity. Overall prognosis poor and the critical care physician had an extensive conversation with the patient's family on 07/08/2014. They did agree to a DO NOT RESUSCITATE but otherwise planned to continue aggressive care. Subsequently trickle tube feedings were resumed with the addition of Reglan on 8/27 was contacted by RN secondary to see patient not tolerating tube feeds. Tube feeds were leaking   freely around PEG tube onto mattress, residuals were at~166ml. In addition patient was in pain. Tube  feeds were ordered held until Dr Elnoria Howard (GI), or Dr. Violeta Gelinas (surgery) evaluated G-tube and cleared for another tube feed trial. Patient was evaluated on the a.m. of 8/28 patient was continuing to express the increased severity of her abdominal pain (patient even yelled over vent about the severity of her bowel pain )even though tube feeds have been held. Examination revealed the patient's wound had continued Dehiscence, now the two separate wounds are one wound, with bowel visible just below minimal layer of granulation tissue.  HPI/Subjective: 8/29 Patient lethargic, but arousable however after patient is moved in any manner, or wound check patient is in excruciating abdominal pain.    Assessment/Plan:  Chronic respiratory failure/Dependent on ventilator/Trach/ severe COPD  -Not appropriate to attempt ventilator weaning at this time -Patient not overbreathing vent. -Patient has partially compensated metabolic alkalosis  Chronic hypotension  -MAP remain borderline just over 60.  -remains on stress dose steroids so will taper slowly-no cortisol level was obtained  -albumin 50 gm x1 -Bolus 1 L normal saline, then normal saline at 61ml/hr  Partially compensated severe metabolic alkalosis -See chronic hypotension -Acetazolamide IV 100 mg BID -Check ABG and  BMP prior to second dose  PEG tube malfunction/Colon perforation/Open  wound of abdomen  -Per general surgery-thus far tolerating trickle tube feeding-patient endorsing inadequate pain control so we made an attempt to utilize low-dose oxy IR per tube but since unable to tolerate tube feedings due to leakage and increased residuals this medication was discontinued-attempted IV Dilaudid drip at 0.5 mg per hour but blood pressure dropped to 79 systolic so this was discontinued and instead began concentrated morphine by mouth with 5 mg /0.25 cc continue Reglan-continue TNA  -family meeting Saturday 8/29 with the family; see palliative care meeting below   Edema of right upper extremity  -PICC in same arm-venous duplex preliminary result with no DVT   Type II DM  -CBGs variably controlled with a short-term increase during tube feedings and also likely influenced by the fact she remains on stress dose steroids-began low dose long-acting insulin and adjusted sliding scale and now CBGs much better controlled   Anemia, chronic disease  -Hemoglobin stable around 8-8.5   Hypokalemia  -Replete via TNA; continues to remain hypokalemic  -Potassium IV 10 mEq x3 runs -Follow closely; currently secondary to metabolic alkalosis  Protein-calorie malnutrition, severe/ On TNA  -Did not tolerate trickle tube feedings -continue TNA   Chronic dementia  -Although patient carries diagnosis of dementia appears to understand her precarious situation. -As stated to myself, RN Renea Ee, PA Barnetta Chapel (surgery) that she does not wish to live this way.  Sacral decubitus ulcer, stage III -Continue treatment per wound care   Dehiscence of abdominal incision -two separate wounds are one wound, with bowel visible just below minimal layer of granulation tissue -Continue treatment per surgery  Palliative care meeting -Admit with [name] (niece), [name] (son), and [name] (son/on the phone) after going over in detail the past few days of care, and explaining patient's worsening health  despite all efforts family has decided to make patient comfort care. They do not want to escalate care. However they would like to continue current level of treatment. Hopefully this would give [name] (son) time to travel from [place] to be at mothers bedside.   Code Status: FULL Family Communication: See palliative care meeting Disposition Plan: Comfort care extend patient's life until son [name] lives in from [place]    Consultants: Dr. Violeta Gelinas (surgery) Dr. Jeani Hawking (GI) Dr. Coralyn Helling [facility]   Procedure/Significant Events:   Culture 6/18 blood cultures x2 with pan sensitive enterococcus 7/6 respiratory culture: ABUNDANT ALCALIGENES XYLOSOXIDANS and MRSA  8/19 urine culture > 100,000 colonies yeast  8/24 blood cultures x2 NGTD        Antibiotics: Cipro 8/5 >>>  Flagyl 8/5 >>>  Vancomycin 8/5 >>>  Fluconazole 8/25 >>>   DVT prophylaxis: Subcutaneous heparin   Devices Chronic trach   LINES / TUBES:  8/6 double-lumen PICC right brachial 8/17 colostomy RLQ      Continuous Infusions: . sodium chloride 10 mL/hr at 07/08/14 2234  . dextrose 5 % and 0.45 % NaCl with KCl 20 mEq/L    . Marland KitchenTPN (CLINIMIX-E) Adult 75 mL/hr at 07/10/14 1710   And  . fat emulsion 250 mL (07/10/14 1710)  . Marland KitchenTPN (CLINIMIX-E) Adult      Objective: VITAL SIGNS: Temp: 99.1 F (37.3 C) (08/29 1200) Temp src: Oral (08/29 1200) BP: 85/50 mmHg (08/29 0700) Pulse Rate: 92 (08/29 0700) SPO2; 97% on ventilator Mode; PRVC Title volume; 460 Set rate; 18  FIO2: 30% Peep; 5cmH2O   Intake/Output Summary (Last 24 hours)  at 07/11/14 1242 Last data filed at 07/11/14 1200  Gross per 24 hour  Intake 208.33 ml  Output   1950 ml  Net -1741.67 ml     Exam: Gen: No acute respiratory distress-alert and able to phonate around trach and mouth words appropriately  Chest: Coarse to auscultation bilaterally without wheezes, rhonchi or crackles, tracheostomy to ventilator    Cardiac: Regular occasionally tachycardic rate and rhythm, S1-S2, no rubs murmurs or gallops, no peripheral edema, no JVD-diffuse edema consistent with anasarca  Abdomen: Soft and diffusely tender, right colostomy stoma pink and edematous without any stool -only serous drainage; midline abdominal wound open with exposed omentum and bowel with creamy drainage covered by Eakin pouch; note Dehiscence, continues, appear evisceration probable. gastrostomy tube to straight drain with green bilious returns small amount  Extremities: Asymmetrical in appearance without cyanosis, clubbing or effusion-noted diffuse peripheral edema right arm greater than left  Other: TNA infusing via right upper extremity PICC line  Data Reviewed: Basic Metabolic Panel:  Recent Labs Lab 07/06/14 0500 07/07/14 0500 07/09/14 0442 07/10/14 0600 07/10/14 0735 07/11/14 0510 07/11/14 1000  NA 139 138 140 144 143 142  --   K 3.0* 3.9 3.2* 2.9* 3.7 3.5*  --   CL 96 97 97 99 98 98  --   CO2 35* 33* 35* 41* 40* 39*  --   GLUCOSE 128* 160* 239* 213* 145* 105*  --   BUN 26* 30* 28* 26* 27* 29*  --   CREATININE 0.47* 0.44* 0.37* 0.41* 0.40* 0.43*  --   CALCIUM 7.8* 8.3* 8.0* 8.1* 8.3* 8.3*  --   MG 1.6 2.2 1.8  --  2.0 2.1  --   PHOS 2.8  --  2.4  --  1.8*  --  2.6   Liver Function Tests:  Recent Labs Lab 07/05/14 0655 07/06/14 0500 07/09/14 0442 07/10/14 0735  AST 19 14 14 15   ALT 12 11 11 10   ALKPHOS 71 70 70 73  BILITOT 0.2* 0.3 0.3 0.3  PROT 4.9* 4.8* 4.8* 4.5*  ALBUMIN 2.0* 2.1* 1.8* 1.7*   No results found for this basename: LIPASE, AMYLASE,  in the last 168 hours No results found for this basename: AMMONIA,  in the last 168 hours CBC:  Recent Labs Lab 07/06/14 0500 07/10/14 0600  WBC 10.4 5.6  NEUTROABS 9.7*  --   HGB 8.1* 7.2*  HCT 24.8* 23.5*  MCV 95.4 99.6  PLT 274 255   Cardiac Enzymes:  Recent Labs Lab 07/10/14 0735  CKTOTAL 23   BNP (last 3 results)  Recent Labs   12/26/13 1130 01/31/14 1540 02/03/14 1535  PROBNP 130.4* 155.9* 129.5*   CBG:  Recent Labs Lab 07/10/14 2006 07/11/14 0003 07/11/14 0400 07/11/14 0746 07/11/14 1155  GLUCAP 139* 132* 124* 143* 106*    Recent Results (from the past 240 hour(s))  URINE CULTURE     Status: None   Collection Time    07/01/14  9:15 PM      Result Value Ref Range Status   Specimen Description URINE, CATHETERIZED   Final   Special Requests NONE   Final   Culture  Setup Time     Final   Value: 07/02/2014 09:02     Performed at [facility] Count     Final   Value: >=100,000 COLONIES/ML     Performed at [facility]   Culture     Final   Value: YEAST  Performed at [facility]   Report Status 07/03/2014 FINAL   Final  CULTURE, BLOOD (ROUTINE X 2)     Status: None   Collection Time    07/06/14  3:45 PM      Result Value Ref Range Status   Specimen Description BLOOD RIGHT HAND   Final   Special Requests BOTTLES DRAWN AEROBIC ONLY 8CC   Final   Culture  Setup Time     Final   Value: 07/06/2014 21:10     Performed at [facility]   Culture     Final   Value:        BLOOD CULTURE RECEIVED NO GROWTH TO DATE CULTURE WILL BE HELD FOR 5 DAYS BEFORE ISSUING A FINAL NEGATIVE REPORT     Performed at [facility]   Report Status PENDING   Incomplete  CULTURE, BLOOD (ROUTINE X 2)     Status: None   Collection Time    07/06/14  4:05 PM      Result Value Ref Range Status   Specimen Description BLOOD LEFT HAND   Final   Special Requests BOTTLES DRAWN AEROBIC AND ANAEROBIC 5CC   Final   Culture  Setup Time     Final   Value: 07/06/2014 21:10     Performed at [facility]   Culture     Final   Value:        BLOOD CULTURE RECEIVED NO GROWTH TO DATE CULTURE WILL BE HELD FOR 5 DAYS BEFORE ISSUING A FINAL NEGATIVE REPORT     Performed at [facility]   Report Status PENDING   Incomplete     Studies:  Recent x-ray studies have been  reviewed in detail by the Attending Physician  Scheduled Meds:  Scheduled Meds: . antiseptic oral rinse  7 mL Mouth Rinse QID  . chlorhexidine  15 mL Mouth Rinse BID  . ciprofloxacin  400 mg Intravenous Q12H  . fluconazole (DIFLUCAN) IV  100 mg Intravenous Q24H  . heparin  5,000 Units Subcutaneous 3 times per day  . hydrocortisone sodium succinate  50 mg Intravenous Q8H  . insulin aspart  0-15 Units Subcutaneous 6 times per day  . insulin glargine  5 Units Subcutaneous QHS  . metronidazole  500 mg Intravenous Q8H  . pantoprazole (PROTONIX) IV  40 mg Intravenous QHS  . sodium chloride  1,000 mL Intravenous Once  . vancomycin  1,000 mg Intravenous Q24H    Time spent on care of this patient: 40 mins   Drema Dallas , MD   [facility] Office  [phone] Pager [phone]  On-Call/Text Page:      [email or web address]      password [email or web address]  If 7PM-7AM, please contact night-coverage [email or web address] Password [email or web address] 07/11/2014, 12:42 PM   LOS: 10 days

## 2014-07-11 NOTE — Progress Notes (Signed)
8 Days Post-Op  Subjective: On vent  Objective: Vital signs in last 24 hours: Temp:  [96.6 F (35.9 C)-98.8 F (37.1 C)] 98.8 F (37.1 C) (08/29 0401) Pulse Rate:  [80-106] 92 (08/29 0700) Resp:  [14-27] 17 (08/29 0700) BP: (72-118)/(43-62) 85/50 mmHg (08/29 0700) SpO2:  [91 %-100 %] 98 % (08/29 0700) FiO2 (%):  [30 %] 30 % (08/29 0741) Weight:  [129 lb 10.1 oz (58.8 kg)] 129 lb 10.1 oz (58.8 kg) (08/29 0500) Last BM Date: 07/09/14  Intake/Output from previous day: 08/28 0701 - 08/29 0700 In: 175 [I.V.:110; TPN:65] Out: 1400 [Urine:1075; Drains:300; Stool:25] Intake/Output this shift: Total I/O In: 148.3 [TPN:148.3] Out: -   Incision/Wound: Stable in appearance with mepitel and saline gauze Lab Results:   Recent Labs  07/10/14 0600  WBC 5.6  HGB 7.2*  HCT 23.5*  PLT 255   BMET  Recent Labs  07/10/14 0735 07/11/14 0510  NA 143 142  K 3.7 3.5*  CL 98 98  CO2 40* 39*  GLUCOSE 145* 105*  BUN 27* 29*  CREATININE 0.40* 0.43*  CALCIUM 8.3* 8.3*   PT/INR No results found for this basename: LABPROT, INR,  in the last 72 hours ABG  Recent Labs  07/10/14 0845  PHART 7.368  HCO3 40.4*    Studies/Results: No results found.  Anti-infectives: Anti-infectives   Start     Dose/Rate Route Frequency Ordered Stop   07/07/14 1500  fluconazole (DIFLUCAN) IVPB 100 mg     100 mg 50 mL/hr over 60 Minutes Intravenous Every 24 hours 07/06/14 1414     07/06/14 1500  fluconazole (DIFLUCAN) IVPB 200 mg     200 mg 100 mL/hr over 60 Minutes Intravenous  Once 07/06/14 1414 07/06/14 1749   07/02/14 0800  metroNIDAZOLE (FLAGYL) IVPB 500 mg     500 mg 100 mL/hr over 60 Minutes Intravenous Every 8 hours 07/02/14 0054     07/02/14 0600  vancomycin (VANCOCIN) IVPB 1000 mg/200 mL premix     1,000 mg 200 mL/hr over 60 Minutes Intravenous Every 24 hours 07/02/14 0054     07/02/14 0200  ciprofloxacin (CIPRO) IVPB 400 mg     400 mg 200 mL/hr over 60 Minutes Intravenous Every  12 hours 07/02/14 0054        Assessment/Plan: s/p Procedure(s): PERCUTANEOUS ENDOSCOPIC GASTROSTOMY (PEG) PLACEMENT (N/A) Transcolonic percutaneous gastrostomy tube POD #15 s/p segmental transverse colectomy, wedge gastrectomy, open Stamm gastrostomy, End colostomy  Now with Dislodged G-tube POD #8 s/p replacement of G tube (Dr. Elnoria Howard - 07/03/14)  Wound dehiscence with bowel visible just under the minimal layer of granulation tissue  PCM on TPN, TF on hold  Continue wound care. Poor prognosis for meaningful recovery.  LOS: 10 days    Laketa Sandoz E 07/11/2014

## 2014-07-11 NOTE — Progress Notes (Signed)
PARENTERAL NUTRITION CONSULT NOTE   Pharmacy Consult for TPN Indication: PEG tube malfunction; Intolerance to TF   Patient Measurements: Height:  (154.9 cm) Weight: 129 lb 10.1 oz (58.8 kg) IBW/kg (Calculated) : 47.8   Vital Signs: Temp: 98.8 F (37.1 C) (08/29 0401) Temp src: Axillary (08/29 0401) BP: 85/50 mmHg (08/29 0700) Pulse Rate: 92 (08/29 0700) Intake/Output from previous day: 08/28 0701 - 08/29 0700 In: 175 [I.V.:110; TPN:65] Out: 1250 [Urine:950; Drains:300] Intake/Output from this shift:    Labs:  Recent Labs  07/10/14 0600  WBC 5.6  HGB 7.2*  HCT 23.5*  PLT 255     Recent Labs  07/09/14 0442 07/10/14 0600 07/10/14 0735 07/11/14 0510  NA 140 144 143 142  K 3.2* 2.9* 3.7 3.5*  CL 97 99 98 98  CO2 35* 41* 40* 39*  GLUCOSE 239* 213* 145* 105*  BUN 28* 26* 27* 29*  CREATININE 0.37* 0.41* 0.40* 0.43*  CALCIUM 8.0* 8.1* 8.3* 8.3*  MG 1.8  --  2.0 2.1  PHOS 2.4  --  1.8*  --   PROT 4.8*  --  4.5*  --   ALBUMIN 1.8*  --  1.7*  --   AST 14  --  15  --   ALT 11  --  10  --   ALKPHOS 70  --  73  --   BILITOT 0.3  --  0.3  --    Estimated Creatinine Clearance: 53.2 ml/min (by C-G formula based on Cr of 0.43).    Recent Labs  07/10/14 2006 07/11/14 0003 07/11/14 0400  GLUCAP 139* 132* 124*    Insulin Requirements in the past 24 hours:  15 units SSI; Lantus 5 units qHS; ~9 units regular insulin in TPN  Current Nutrition:  NPO Goal (TPN alone): Clinimix E 5/15 at 75 ml/hr and 20% lipid emulsion at 79ml/hr Mon and Fri provides daily average of 1415 kcal and 90 gm protein.  Nutritional Goals:   1200-1400 kCal, 90-100 grams of protein per day (goal change per RD note 8/24)  Assessment: 73 year old female from [name]. Chronic vent/trach. D/c from [facility] 8/18 to [name], PEG noted to be dislodged 8/19. It was replaced at [name] but continued to have drainage and bubbling around tube. Brought to ED for evaluation.  GI: 8/5 - 8/18 >  admission to [facility]  8/14: To OR for segmental transverse colectomy, wedge gastrectomy, open Stamm gastrostomy, and End colostomy  8/19: PEG dislodged, attempted to replace at [name]. Drainage at site, to ED.  8/21: PEG replaced in ENDO by Dr Elnoria Howard. Started on TPN per [facility] recs. 8/22: No signs of peritonitis, wait at least 48 hours before starting TF. 8/23: Wound dehiscence, concerning that fistulas may develop.  No TF until 8/24 at the earliest.  8/24: TF (Osmolite 1.2) started at 2ml/hr (goal rate =30 ml/hr). 8/25: Pt actively vomiting; also with black secretions around trach site. Abd distended. TF on hold. Prealbumin up to 23.3 (wnl). 8/26: TF restarted yesterday.  8/27: Although initial residual high, pt tolerating overnight with no residuals this morning. 8/28: PEG tube noted to be leaking so TF stopped.  Endo: DM. She is on chronic steroids- currently tapering stress dose steroids. Had several episodes of hypoglycemia 8/23, which eventually required discontinuation of her TPN that contained insulin.  Her CBGs continue upward trend - lantus added 8/27. Will target CBGs <180 to avoid repeat hypoglcemia.   Lytes: K 3.5. Corrected Ca WNL.  Renal: Scr stable,  UOP 0.7 ml/kg/hr.  Net negative 1L  Pulm: COPD, chronic trach - remains on vent support.  Cards: Hx HTN. BP ok, HR normal-tachy- currently on no CV meds.  Hepatobil: LFT's WNL. Trigs 38 (elevated TG=332 on 8/22 due to labs being drawn from TPN port)  Neuro: Anxiety, dementia, lumbar compression fx.   ID: Vanco/Flagyl/Cipro (all since at least 8/4) for intraabdominal process. Diflucan added 8/24 for yeast in urine. Afebrile. WBC wnl.  Best Practices: IV PPI, Heparin SQ  TPN Access: 8/6 double-lumen PICC line  TPN day#: 8/10 >> 8/18; resumed 8/21>>  Plan:  1. Continue Clinimix E 5/15 to 75 ml/hr and continue 20% lipid emulsion 10 ml/hr on Mon and Fri only. This is goal TPN rate which meets 100% of patient's estimated needs.  2.  Continue CBGs, SSI, and Lantus as ordered. Continue small amount of insulin in TPN bag (pt with receive ~9 units regular insulin daily). 3. Give KCl 15mEq/50ml x 3 4. BMET in AM  Celedonio Miyamoto, PharmD, [facility] Clinical Pharmacist Pager [phone]   07/11/2014  7:22 AM

## 2014-07-11 NOTE — Progress Notes (Signed)
ANTIBIOTIC CONSULT NOTE - FOLLOW UP  Pharmacy Consult:  Vancomycin / Cipro / Flagyl Indication:  Intra-abdominal infection / Sacral decubitus ulcer  Allergies  Allergen Reactions  . Namenda [Memantine Hcl] Other (See Comments)    "hallucinations"   . Codeine Other (See Comments)    Unknown. Listed on [facility]  . Penicillins Other (See Comments)    Unknown; listed on [facility]    Patient Measurements: Height:  (154.9 cm) Weight: 129 lb 10.1 oz (58.8 kg) IBW/kg (Calculated) : 47.8  Vital Signs: Temp: 98.2 F (36.8 C) (08/29 0800) Temp src: Oral (08/29 0800) BP: 85/50 mmHg (08/29 0700) Pulse Rate: 92 (08/29 0700) Intake/Output from previous day: 08/28 0701 - 08/29 0700 In: 175 [I.V.:110; TPN:65] Out: 1400 [Urine:1075; Drains:300; Stool:25] Intake/Output from this shift: Total I/O In: 148.3 [TPN:148.3] Out: 350 [Urine:350]  Labs:  Recent Labs  07/10/14 0600 07/10/14 0735 07/11/14 0510  WBC 5.6  --   --   HGB 7.2*  --   --   PLT 255  --   --   CREATININE 0.41* 0.40* 0.43*   Estimated Creatinine Clearance: 53.2 ml/min (by C-G formula based on Cr of 0.43). No results found for this basename: VANCOTROUGH, Leodis Binet, VANCORANDOM, GENTTROUGH, GENTPEAK, GENTRANDOM, TOBRATROUGH, TOBRAPEAK, TOBRARND, AMIKACINPEAK, AMIKACINTROU, AMIKACIN,  in the last 72 hours   Microbiology: Recent Results (from the past 720 hour(s))  MRSA PCR SCREENING     Status: Abnormal   Collection Time    06/17/14  6:28 PM      Result Value Ref Range Status   MRSA by PCR POSITIVE (*) NEGATIVE Final   Comment:            The GeneXpert MRSA Assay (FDA     approved for NASAL specimens     only), is one component of a     comprehensive MRSA colonization     surveillance program. It is not     intended to diagnose MRSA     infection nor to guide or     monitor treatment for     MRSA infections.     RESULT CALLED TO, READ BACK BY AND VERIFIED WITH:     A BECK RN [id number]  06/17/14 A BROWNING  SURGICAL PCR SCREEN     Status: Abnormal   Collection Time    06/26/14  8:20 AM      Result Value Ref Range Status   MRSA, PCR POSITIVE (*) NEGATIVE Final   Staphylococcus aureus POSITIVE (*) NEGATIVE Final   Comment:            The Xpert SA Assay (FDA     approved for NASAL specimens     in patients over 71 years of age),     is one component of     a comprehensive surveillance     program.  Test performance has     been validated by [employer] for patients greater     than or equal to 16 year old.     It is not intended     to diagnose infection nor to     guide or monitor treatment.  URINE CULTURE     Status: None   Collection Time    07/01/14  9:15 PM      Result Value Ref Range Status   Specimen Description URINE, CATHETERIZED   Final   Special Requests NONE   Final   Culture  Setup Time     Final   Value: 07/02/2014 09:02     Performed at [facility] Count     Final   Value: >=100,000 COLONIES/ML     Performed at [facility]   Culture     Final   Value: YEAST     Performed at [facility]   Report Status 07/03/2014 FINAL   Final  CULTURE, BLOOD (ROUTINE X 2)     Status: None   Collection Time    07/06/14  3:45 PM      Result Value Ref Range Status   Specimen Description BLOOD RIGHT HAND   Final   Special Requests BOTTLES DRAWN AEROBIC ONLY 8CC   Final   Culture  Setup Time     Final   Value: 07/06/2014 21:10     Performed at [facility]   Culture     Final   Value:        BLOOD CULTURE RECEIVED NO GROWTH TO DATE CULTURE WILL BE HELD FOR 5 DAYS BEFORE ISSUING A FINAL NEGATIVE REPORT     Performed at [facility]   Report Status PENDING   Incomplete  CULTURE, BLOOD (ROUTINE X 2)     Status: None   Collection Time    07/06/14  4:05 PM      Result Value Ref Range Status   Specimen Description BLOOD LEFT HAND   Final   Special Requests BOTTLES DRAWN AEROBIC AND ANAEROBIC 5CC   Final    Culture  Setup Time     Final   Value: 07/06/2014 21:10     Performed at [facility]   Culture     Final   Value:        BLOOD CULTURE RECEIVED NO GROWTH TO DATE CULTURE WILL BE HELD FOR 5 DAYS BEFORE ISSUING A FINAL NEGATIVE REPORT     Performed at [facility]   Report Status PENDING   Incomplete      Assessment: 73 YOF to continue vancomycin, ciprofloxacin, and metronidazole for intra-abdominal process and sacral decubitus ulcer - recheck Bcx 8/24 remain NGTD but will continue to follow for final result.  She is also on fluconazole for yeast in her urine culture.  [name] renal function has been stable.  Cipro 8/5 (con't from [name]) >> Flagyl 8/5 (con't from [name]) >> Vanc 8/6 (con't from [name]) >> Diflucan 8/24 >>  8/12 VT = 17.3 mcg/mL on 1gm IV q24h 8/23 VT = 16.4 mcg/mL - no change  8/19 UCx: >100 kcol yeast 8/24 BCx x2 - NGTD   Goal of Therapy:  Vancomycin trough level 15-20 mcg/ml   Plan:  - Vanc 1gm IV Q24H - Cipro 400mg  IV Q12H - Flagyl 500mg  IV Q8H - Monitor renal fxn, clinical progress, weekly vanc trough (due ~8/30) - F/U LOT and goals of care after family meeting today   Leota Sauers Pharm.D. CPP, BCPS Clinical Pharmacist [phone] 07/11/2014 11:10 AM

## 2014-07-12 LAB — CBC WITH DIFFERENTIAL/PLATELET
Basophils Absolute: 0 10*3/uL (ref 0.0–0.1)
Basophils Relative: 0 % (ref 0–1)
Eosinophils Absolute: 0 10*3/uL (ref 0.0–0.7)
Eosinophils Relative: 0 % (ref 0–5)
HCT: 20.8 % — ABNORMAL LOW (ref 36.0–46.0)
Hemoglobin: 6.5 g/dL — CL (ref 12.0–15.0)
LYMPHS ABS: 0.4 10*3/uL — AB (ref 0.7–4.0)
LYMPHS PCT: 5 % — AB (ref 12–46)
MCH: 30.8 pg (ref 26.0–34.0)
MCHC: 31.3 g/dL (ref 30.0–36.0)
MCV: 98.6 fL (ref 78.0–100.0)
MONOS PCT: 3 % (ref 3–12)
Monocytes Absolute: 0.2 10*3/uL (ref 0.1–1.0)
Neutro Abs: 6.9 10*3/uL (ref 1.7–7.7)
Neutrophils Relative %: 92 % — ABNORMAL HIGH (ref 43–77)
PLATELETS: 209 10*3/uL (ref 150–400)
RBC: 2.11 MIL/uL — ABNORMAL LOW (ref 3.87–5.11)
RDW: 15.4 % (ref 11.5–15.5)
WBC: 7.5 10*3/uL (ref 4.0–10.5)

## 2014-07-12 LAB — BLOOD GAS, ARTERIAL
Acid-Base Excess: 10.8 mmol/L — ABNORMAL HIGH (ref 0.0–2.0)
Bicarbonate: 35.1 mEq/L — ABNORMAL HIGH (ref 20.0–24.0)
Drawn by: 13898
FIO2: 0.3 %
LHR: 18 {breaths}/min
O2 Saturation: 97.8 %
PCO2 ART: 49 mmHg — AB (ref 35.0–45.0)
PEEP/CPAP: 5 cmH2O
PH ART: 7.469 — AB (ref 7.350–7.450)
Patient temperature: 98.9
TCO2: 36.6 mmol/L (ref 0–100)
VT: 460 mL
pO2, Arterial: 83.7 mmHg (ref 80.0–100.0)

## 2014-07-12 LAB — GLUCOSE, CAPILLARY
GLUCOSE-CAPILLARY: 92 mg/dL (ref 70–99)
GLUCOSE-CAPILLARY: 153 mg/dL — AB (ref 70–99)
Glucose-Capillary: 110 mg/dL — ABNORMAL HIGH (ref 70–99)
Glucose-Capillary: 116 mg/dL — ABNORMAL HIGH (ref 70–99)
Glucose-Capillary: 129 mg/dL — ABNORMAL HIGH (ref 70–99)
Glucose-Capillary: 82 mg/dL (ref 70–99)
Glucose-Capillary: 97 mg/dL (ref 70–99)

## 2014-07-12 LAB — BASIC METABOLIC PANEL
ANION GAP: 9 (ref 5–15)
BUN: 31 mg/dL — AB (ref 6–23)
CHLORIDE: 97 meq/L (ref 96–112)
CO2: 34 mEq/L — ABNORMAL HIGH (ref 19–32)
Calcium: 7.9 mg/dL — ABNORMAL LOW (ref 8.4–10.5)
Creatinine, Ser: 0.43 mg/dL — ABNORMAL LOW (ref 0.50–1.10)
GFR calc non Af Amer: >90 mL/min (ref >90)
Glucose, Bld: 80 mg/dL (ref 70–99)
POTASSIUM: 3.8 meq/L (ref 3.7–5.3)
Sodium: 140 mEq/L (ref 137–147)

## 2014-07-12 LAB — PREPARE RBC (CROSSMATCH)

## 2014-07-12 LAB — CULTURE, BLOOD (ROUTINE X 2)
CULTURE: NO GROWTH
CULTURE: NO GROWTH

## 2014-07-12 MED ORDER — INSULIN GLARGINE 100 UNIT/ML ~~LOC~~ SOLN
2.0000 [IU] | Freq: Every day | SUBCUTANEOUS | Status: DC
  Administered 2014-07-12 – 2014-07-14 (×3): 2 [IU] via SUBCUTANEOUS
  Filled 2014-07-12 (×3): qty 0.02

## 2014-07-12 MED ORDER — SODIUM CHLORIDE 0.9 % IV SOLN: Freq: Once | INTRAVENOUS | Status: DC

## 2014-07-12 MED ORDER — TRACE MINERALS CR-CU-F-FE-I-MN-MO-SE-ZN IV SOLN
INTRAVENOUS | Status: AC
  Administered 2014-07-12: 18:00:00 via INTRAVENOUS
  Filled 2014-07-12: qty 2000

## 2014-07-12 NOTE — Progress Notes (Signed)
CRITICAL VALUE ALERT  Critical value received:  Hgb 6.5  Date of notification:  07/12/14  Time of notification:  0515  Critical value read back:yes  Nurse who received alert:  Bridget Hartshorn, RN  MD notified (1st page):  D. Sherrie Mustache, MD  Time of first page:  0520  MD notified (2nd page):   Time of second page:  Responding MD:  Sherrie Mustache, MD  Time MD responded:  Milo.Lady  MD aware of Hgb. No new orders at this time. Will monitor.   M.Foster Simpson, RN

## 2014-07-12 NOTE — Progress Notes (Signed)
Patient ID: Katelyn Rojas, female   DOB: Aug 31, 1941, 73 y.o.   MRN: [id number] 9 Days Post-Op  Subjective: On vent.  Complains of pain.    Objective: Vital signs in last 24 hours: Temp:  [98.1 F (36.7 C)-99.1 F (37.3 C)] 98.1 F (36.7 C) (08/30 0802) Pulse Rate:  [69-124] 73 (08/30 0802) Resp:  [0-19] 18 (08/30 0802) BP: (81-112)/(43-55) 98/52 mmHg (08/30 0802) SpO2:  [95 %-100 %] 100 % (08/30 0802) FiO2 (%):  [30 %-40 %] 30 % (08/30 0802) Weight:  [124 lb 5.4 oz (56.4 kg)] 124 lb 5.4 oz (56.4 kg) (08/30 0635) Last BM Date: 07/11/14  Intake/Output from previous day: 08/29 0701 - 08/30 0700 In: 5214.4 [I.V.:250; IV Piggyback:2994.4; TPN:1970] Out: 1990 [Urine:1700; Drains:100; Stool:190] Intake/Output this shift: Total I/O In: -  Out: 300 [Urine:300]  Incision/Wound: Sutures visible under mepilex.  Ostomy pink.  Gas and small amt stool in bag.  PEG tube in place with bilious gastric secretions.    Lab Results:   Recent Labs  07/10/14 0600 07/12/14 0415  WBC 5.6 7.5  HGB 7.2* 6.5*  HCT 23.5* 20.8*  PLT 255 209   BMET  Recent Labs  07/11/14 0510 07/12/14 0415  NA 142 140  K 3.5* 3.8  CL 98 97  CO2 39* 34*  GLUCOSE 105* 80  BUN 29* 31*  CREATININE 0.43* 0.43*  CALCIUM 8.3* 7.9*   PT/INR No results found for this basename: LABPROT, INR,  in the last 72 hours ABG  Recent Labs  07/11/14 0800 07/12/14 0435  PHART 7.534* 7.469*  HCO3 37.3* 35.1*    Studies/Results: No results found.  Anti-infectives: Anti-infectives   Start     Dose/Rate Route Frequency Ordered Stop   07/07/14 1500  fluconazole (DIFLUCAN) IVPB 100 mg     100 mg 50 mL/hr over 60 Minutes Intravenous Every 24 hours 07/06/14 1414     07/06/14 1500  fluconazole (DIFLUCAN) IVPB 200 mg     200 mg 100 mL/hr over 60 Minutes Intravenous  Once 07/06/14 1414 07/06/14 1749   07/02/14 0800  metroNIDAZOLE (FLAGYL) IVPB 500 mg     500 mg 100 mL/hr over 60 Minutes Intravenous Every 8 hours  07/02/14 0054     07/02/14 0600  vancomycin (VANCOCIN) IVPB 1000 mg/200 mL premix     1,000 mg 200 mL/hr over 60 Minutes Intravenous Every 24 hours 07/02/14 0054     07/02/14 0200  ciprofloxacin (CIPRO) IVPB 400 mg     400 mg 200 mL/hr over 60 Minutes Intravenous Every 12 hours 07/02/14 0054        Assessment/Plan: s/p Procedure(s): PERCUTANEOUS ENDOSCOPIC GASTROSTOMY (PEG) PLACEMENT (N/A) Transcolonic percutaneous gastrostomy tube POD #15 s/p segmental transverse colectomy, wedge gastrectomy, open Stamm gastrostomy, End colostomy  Now with Dislodged G-tube POD #9 s/p replacement of G tube (Dr. Elnoria Howard - 07/03/14)  Wound dehiscence with bowel visible just under the minimal layer of granulation tissue  PCM on TPN, TF on hold   Stable Continue wound care. Poor prognosis for meaningful recovery. Palliative care/hospice would be useful.   LOS: 11 days    Theone Bowell 07/12/2014

## 2014-07-12 NOTE — Progress Notes (Signed)
PARENTERAL NUTRITION CONSULT NOTE   Pharmacy Consult for TPN Indication: PEG tube malfunction; Intolerance to TF   Patient Measurements: Height:  (154.9 cm) Weight: 124 lb 5.4 oz (56.4 kg) IBW/kg (Calculated) : 47.8   Vital Signs: Temp: 98.8 F (37.1 C) (08/30 0400) Temp src: Axillary (08/30 0400) BP: 98/52 mmHg (08/30 0700) Pulse Rate: 71 (08/30 0700) Intake/Output from previous day: 08/29 0701 - 08/30 0700 In: 5214.4 [I.V.:250; IV Piggyback:2994.4; TPN:1970] Out: 1990 [Urine:1700; Drains:100; Stool:190] Intake/Output from this shift:    Labs:  Recent Labs  07/10/14 0600 07/12/14 0415  WBC 5.6 7.5  HGB 7.2* 6.5*  HCT 23.5* 20.8*  PLT 255 209     Recent Labs  07/10/14 0735 07/11/14 0510 07/11/14 1000 07/12/14 0415  NA 143 142  --  140  K 3.7 3.5*  --  3.8  CL 98 98  --  97  CO2 40* 39*  --  34*  GLUCOSE 145* 105*  --  80  BUN 27* 29*  --  31*  CREATININE 0.40* 0.43*  --  0.43*  CALCIUM 8.3* 8.3*  --  7.9*  MG 2.0 2.1  --   --   PHOS 1.8*  --  2.6  --   PROT 4.5*  --   --   --   ALBUMIN 1.7*  --   --   --   AST 15  --   --   --   ALT 10  --   --   --   ALKPHOS 73  --   --   --   BILITOT 0.3  --   --   --    Estimated Creatinine Clearance: 48.7 ml/min (by C-G formula based on Cr of 0.43).    Recent Labs  07/11/14 1950 07/12/14 0001 07/12/14 0546  GLUCAP 92 97 82    Insulin Requirements in the past 24 hours:  0 units SSI; Lantus 5 units qHS; ~9 units regular insulin in TPN  Current Nutrition:  NPO Goal (TPN alone): Clinimix E 5/15 at 75 ml/hr and 20% lipid emulsion at 14ml/hr Mon and Fri provides daily average of 1415 kcal and 90 gm protein.  Nutritional Goals:   1200-1400 kCal, 90-100 grams of protein per day (goal change per RD note 8/24)  Assessment: 73 year old female from [name]. Chronic vent/trach. D/c from [facility] 8/18 to [name], PEG noted to be dislodged 8/19. It was replaced at [name] but continued to have drainage and  bubbling around tube. Brought to ED for evaluation.  GI: 8/5 - 8/18 > admission to [facility]  8/14: To OR for segmental transverse colectomy, wedge gastrectomy, open Stamm gastrostomy, and End colostomy  8/19: PEG dislodged, attempted to replace at [name]. Drainage at site, to ED.  8/21: PEG replaced in ENDO by Dr Elnoria Howard. Started on TPN per [facility] recs. 8/22: No signs of peritonitis, wait at least 48 hours before starting TF. 8/23: Wound dehiscence, concerning that fistulas may develop.  No TF until 8/24 at the earliest.  8/24: TF (Osmolite 1.2) started at 39ml/hr (goal rate =30 ml/hr). 8/25: Pt actively vomiting; also with black secretions around trach site. Abd distended. TF on hold. Prealbumin up to 23.3 (wnl). 8/26: TF restarted yesterday.  8/27: Although initial residual high, pt tolerating overnight with no residuals this morning. 8/28: PEG tube noted to be leaking so TF stopped. 8/29: Family meeting held.  Moving to comfort measures.  Awaiting son's arrival from [place].  Endo: DM. She is  on chronic steroids- currently tapering stress dose steroids. Had several episodes of hypoglycemia 8/23, which eventually required discontinuation of her TPN that contained insulin. Lantus added 8/27. Will target CBGs <180 to avoid repeat hypoglcemia.   Lytes: K 3.8. Corrected Ca WNL.  Renal: Scr stable, UOP 1.3 ml/kg/hr.  Net positive 3L yesterday  Pulm: COPD, chronic trach - remains on vent support.  Cards: Hx HTN. BP ok, HR normal-tachy- currently on no CV meds.  Hepatobil: LFT's WNL. Trigs 38 (elevated TG=332 on 8/22 due to labs being drawn from TPN port)  Neuro: Anxiety, dementia, lumbar compression fx.   ID: Vanco/Flagyl/Cipro (all since at least 8/4) for intraabdominal process. Diflucan added 8/24 for yeast in urine. Afebrile. WBC wnl.  Best Practices: IV PPI, Heparin SQ  TPN Access: 8/6 double-lumen PICC line  TPN day#: 8/10 >> 8/18; resumed 8/21>>  Plan:  1. Continue Clinimix E 5/15 to  75 ml/hr and continue 20% lipid emulsion 10 ml/hr on Mon and Fri only. This is goal TPN rate which meets 100% of patient's estimated needs.  2. Continue CBGs, SSI, and Lantus as ordered. Continue small amount of insulin in TPN bag (pt with receive ~9 units regular insulin daily). 3. TPN labs in AM  Celedonio Miyamoto, PharmD, [facility] Clinical Pharmacist Pager [phone]   07/12/2014  8:07 AM

## 2014-07-12 NOTE — Progress Notes (Signed)
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 Because of underlying severe COPD she has become chronically dependent on the ventilator. At some point she had a PEG tube placed by interventional radiologist and was discharged to an LTAC. On 06/17/2014 she was admitted from the LTAC due to concerns of stool draining from the PEG tube. After evaluation it was proven she had a trans-colonic gastrostomy tube. She later underwent extensive surgical repair that included creation of right colostomy and placement of a new open gastrostomy tube and was subsequently discharged back to LTAC on 06/30/2014. She also has a history of MRSA and enterococcus bacteremia and prior tracheitis with cultures positive for Alcaligenes bacteria.  She was sent back to [facility] on 07/01/2014 due to concerns of dislodgment of the recently placed gastrostomy tube. Prior to presentation to the emergency department there were several attempts to replace the tube that were unsuccessful. In the emergency department the previous tube was completely removed by the surgeon and a new tube was placed. Placement was checked via CT scan and unfortunately the replaced tube was not in proper position and it was subsequently removed. Since the stomach was sutured to the anterior abdominal wall it was felt that endoscopic placement of feeding tube would be safest approach. Gastroenterology was consulted and on 07/03/2014 patient underwent successful PEG tube placement. In the interim patient's wound subsequently dehisced and based on appearance (bowel fixed to abdominal wall) and clinical exam it was determined that closing the wound in the OR could not be safely accomplished therefore the wound was covered with an Eakin's pouch. Attempts had been made to initiate tube feedings but patient  experienced nausea and vomiting so the gastric tube was placed to gravity. Overall prognosis poor and the critical care physician had an extensive conversation with the patient's family on 07/08/2014. They did agree to a DO NOT RESUSCITATE but otherwise planned to continue aggressive care. Subsequently trickle tube feedings were resumed with the addition of Reglan on 8/27 was contacted by RN secondary to see patient not tolerating tube feeds. Tube feeds were leaking   freely around PEG tube onto mattress, residuals were at~165ml. In addition patient was in pain. Tube  feeds were ordered held until Dr Elnoria Howard (GI), or Dr. Violeta Gelinas (surgery) evaluated G-tube and cleared for another tube feed trial. Patient was evaluated on the a.m. of 8/28 patient was continuing to express the increased severity of her abdominal pain (patient even yelled over vent about the severity of her bowel pain )even though tube feeds have been held. Examination revealed the patient's wound had continued Dehiscence, now the two separate wounds are one wound, with bowel visible just below minimal layer of granulation tissue. Dr. Joseph Art had EOL meeting to determine goals of care and patient currently is a DNR.  HPI/Subjective:  Alert in some pain. Cannot tell me where she is or what room she is in. Niece is at the bedside and states her edema is improved after her being given albumin  Assessment/Plan:  Chronic respiratory failure/Dependent on ventilator/Trach/ severe COPD  -Not appropriate to attempt ventilator weaning at this time -Patient not overbreathing vent. -Patient has partially compensated metabolic alkalosis  Chronic hypotension  -MAP remain borderline just over 60.  -continue stress dose steroids -albumin 50 gm x1-defer to Dr. Joseph Art regarding further Rx with this -Bolus 1 L normal  saline, then normal saline at 36ml/hr  Partially compensated severe metabolic alkalosis -See chronic hypotension -Acetazolamide IV  100 mg BID -Check ABG and BMP prior to second dose  PEG tube malfunction/Colon perforation/Open wound of abdomen  -Per general surgery-contionue concentrated morphine by mouth with 5 mg /0.25 cc continue Reglan-continue TNA [consider addition albumin]  -family meeting Saturday 8/29 with the family; see palliative care meeting below   Edema of right upper extremity  -PICC in same arm-venous duplex preliminary result with no DVT   Type II DM  -CBGs 116-92 -continue TPN with insulin -cut back lantus to 2 units  Anemia, chronic disease  -Hemoglobin stable around 8-8.5 -could be from intraabdominal bleed-would hold off on scan for now -transfuse 1 U PRBC-if persists, discuss utility of further scanning in am -Rpt CBC in am  Hypokalemia  -Replete via TNA; continues to remain hypokalemic  -Potassium IV 10 mEq x3 runs -Follow closely; currently secondary to metabolic alkalosis  Protein-calorie malnutrition, severe/ On TNA  -Did not tolerate trickle tube feedings -continue TNA   Chronic dementia  -Although patient carries diagnosis of dementia appears to understand her precarious situation. -As stated to myself, RN Renea Ee, PA Barnetta Chapel (surgery) that she does not wish to live this way.  Sacral decubitus ulcer, stage III -Continue treatment per wound care   Dehiscence of abdominal incision -two separate wounds are one wound, with bowel visible just below minimal layer of granulation tissue -Continue treatment per surgery  Palliative care meeting Family is still processing and currently are unwilling to entertain Palliative care or hospice discussion buts seem to understand patient may have only weeks at the most and is at a very real risk of dying  Code Status: FULL Family Communication: See palliative care meeting Disposition Plan: Comfort care to extend patient's life until son [name] can discuss meaningfully with team  Consultants: Dr. Violeta Gelinas (surgery) Dr.  Jeani Hawking (GI) Dr. Coralyn Helling [facility] M.)  Procedure/Significant Events:   Culture 6/18 blood cultures x2 with pan sensitive enterococcus 7/6 respiratory culture: ABUNDANT ALCALIGENES XYLOSOXIDANS and MRSA  8/19 urine culture > 100,000 colonies yeast  8/24 blood cultures x2 NGTD   Antibiotics: Cipro 8/5 >>>  Flagyl 8/5 >>>  Vancomycin 8/5 >>>  Fluconazole 8/25 >>>  DVT prophylaxis: Subcutaneous heparin  Devices Chronic trach   LINES / TUBES:  8/6 double-lumen PICC right brachial 8/17 colostomy RLQ  Continuous Infusions: . sodium chloride 10 mL/hr at 07/08/14 2234  . dextrose 5 % and 0.45 % NaCl with KCl 20 mEq/L    . Marland KitchenTPN (CLINIMIX-E) Adult 75 mL/hr at 07/11/14 1739  . Marland KitchenTPN (CLINIMIX-E) Adult      Objective: VITAL SIGNS: Temp: 98.6 F (37 C) (08/30 1225) Temp src: Oral (08/30 1225) BP: 97/47 mmHg (08/30 1500) Pulse Rate: 65 (08/30 1500)  Vent Mode:  [-] PRVC FiO2 (%):  [30 %-40 %] 30 % Set Rate:  [18 bmp] 18 bmp Vt Set:  [460 mL] 460 mL PEEP:  [5 cmH20] 5 cmH20 Plateau Pressure:  [19 cmH20-28 cmH20] 19 cmH20    Intake/Output Summary (Last 24 hours) at 07/12/14 1529 Last data filed at 07/12/14 1500  Gross per 24 hour  Intake 5746.04 ml  Output   2165 ml  Net 3581.04 ml     Exam: Gen: No acute respiratory distress-alert and able to phonate around trach and mouth words appropriately  Chest: Coarse to auscultation bilaterally without wheezes, rhonchi or crackles, tracheostomy to ventilator  Cardiac: Regular occasionally tachycardic  rate and rhythm, S1-S2, no rubs murmurs or gallops, no peripheral edema, no JVD-diffuse edema consistent with anasarca -seems diminished compared to prior acc to relative Abdomen: Soft and diffusely tender, right colostomy stoma pink and edematous without any stool -Area covered with bandages Extremities: Asymmetrical in appearance without cyanosis, clubbing or effusion-noted diffuse peripheral edema right arm greater than  left  Other: TNA infusing via right upper extremity PICC line  Data Reviewed: Basic Metabolic Panel:  Recent Labs Lab 07/06/14 0500 07/07/14 0500 07/09/14 0442 07/10/14 0600 07/10/14 0735 07/11/14 0510 07/11/14 1000 07/12/14 0415  NA 139 138 140 144 143 142  --  140  K 3.0* 3.9 3.2* 2.9* 3.7 3.5*  --  3.8  CL 96 97 97 99 98 98  --  97  CO2 35* 33* 35* 41* 40* 39*  --  34*  GLUCOSE 128* 160* 239* 213* 145* 105*  --  80  BUN 26* 30* 28* 26* 27* 29*  --  31*  CREATININE 0.47* 0.44* 0.37* 0.41* 0.40* 0.43*  --  0.43*  CALCIUM 7.8* 8.3* 8.0* 8.1* 8.3* 8.3*  --  7.9*  MG 1.6 2.2 1.8  --  2.0 2.1  --   --   PHOS 2.8  --  2.4  --  1.8*  --  2.6  --    Liver Function Tests:  Recent Labs Lab 07/06/14 0500 07/09/14 0442 07/10/14 0735  AST ALT ALKPHOS 70 70 73  BILITOT 0.3 0.3 0.3  PROT 4.8* 4.8* 4.5*  ALBUMIN 2.1* 1.8* 1.7*   No results found for this basename: LIPASE, AMYLASE,  in the last 168 hours No results found for this basename: AMMONIA,  in the last 168 hours CBC:  Recent Labs Lab 07/06/14 0500 07/10/14 0600 07/12/14 0415  WBC 10.4 5.6 7.5  NEUTROABS 9.7*  --  6.9  HGB 8.1* 7.2* 6.5*  HCT 24.8* 23.5* 20.8*  MCV 95.4 99.6 98.6  PLT 274 255 209   Cardiac Enzymes:  Recent Labs Lab 07/10/14 0735  CKTOTAL 23   BNP (last 3 results)  Recent Labs  12/26/13 1130 01/31/14 1540 02/03/14 1535  PROBNP 130.4* 155.9* 129.5*   CBG:  Recent Labs Lab 07/11/14 1950 07/12/14 0001 07/12/14 0546 07/12/14 0757 07/12/14 1208  GLUCAP 92 97 82 129* 116*    Recent Results (from the past 240 hour(s))  CULTURE, BLOOD (ROUTINE X 2)     Status: None   Collection Time    07/06/14  3:45 PM      Result Value Ref Range Status   Specimen Description BLOOD RIGHT HAND   Final   Special Requests BOTTLES DRAWN AEROBIC ONLY 8CC   Final   Culture  Setup Time     Final   Value: 07/06/2014 21:10     Performed at [facility]   Culture      Final   Value: NO GROWTH 5 DAYS     Performed at [facility]   Report Status 07/12/2014 FINAL   Final  CULTURE, BLOOD (ROUTINE X 2)     Status: None   Collection Time    07/06/14  4:05 PM      Result Value Ref Range Status   Specimen Description BLOOD LEFT HAND   Final   Special Requests BOTTLES DRAWN AEROBIC AND ANAEROBIC 5CC   Final   Culture  Setup Time     Final   Value: 07/06/2014 21:10  Performed at [facility]     Final   Value: NO GROWTH 5 DAYS     Performed at [facility]   Report Status 07/12/2014 FINAL   Final     Studies:  Recent x-ray studies have been reviewed in detail by the Attending Physician  Scheduled Meds:  Scheduled Meds: . acetaZOLAMIDE  100 mg Intravenous Q12H  . antiseptic oral rinse  7 mL Mouth Rinse QID  . chlorhexidine  15 mL Mouth Rinse BID  . ciprofloxacin  400 mg Intravenous Q12H  . fluconazole (DIFLUCAN) IV  100 mg Intravenous Q24H  . heparin  5,000 Units Subcutaneous 3 times per day  . hydrocortisone sodium succinate  50 mg Intravenous Q8H  . insulin aspart  0-15 Units Subcutaneous 6 times per day  . insulin glargine  5 Units Subcutaneous QHS  . metronidazole  500 mg Intravenous Q8H  . pantoprazole (PROTONIX) IV  40 mg Intravenous QHS  . vancomycin  1,000 mg Intravenous Q24H    Time spent on care of this patient: 40 mins  Pleas Koch, MD [facility]  If 7PM-7AM, please contact night-coverage [email or web address] Password [email or web address] 07/12/2014, 3:29 PM   LOS: 11 days

## 2014-07-13 LAB — COMPREHENSIVE METABOLIC PANEL
ALK PHOS: 70 U/L (ref 39–117)
ALT: 9 U/L (ref 0–35)
AST: 16 U/L (ref 0–37)
Albumin: 2.2 g/dL — ABNORMAL LOW (ref 3.5–5.2)
Anion gap: 7 (ref 5–15)
BILIRUBIN TOTAL: 0.5 mg/dL (ref 0.3–1.2)
BUN: 33 mg/dL — ABNORMAL HIGH (ref 6–23)
CHLORIDE: 98 meq/L (ref 96–112)
CO2: 33 meq/L — AB (ref 19–32)
CREATININE: 0.44 mg/dL — AB (ref 0.50–1.10)
Calcium: 7.7 mg/dL — ABNORMAL LOW (ref 8.4–10.5)
GFR calc Af Amer: >90 mL/min (ref >90)
GFR calc non Af Amer: >90 mL/min (ref >90)
Glucose, Bld: 160 mg/dL — ABNORMAL HIGH (ref 70–99)
Potassium: 3.2 mEq/L — ABNORMAL LOW (ref 3.7–5.3)
Sodium: 138 mEq/L (ref 137–147)
Total Protein: 4.5 g/dL — ABNORMAL LOW (ref 6.0–8.3)

## 2014-07-13 LAB — GLUCOSE, CAPILLARY
GLUCOSE-CAPILLARY: 181 mg/dL — AB (ref 70–99)
GLUCOSE-CAPILLARY: 186 mg/dL — AB (ref 70–99)
GLUCOSE-CAPILLARY: 191 mg/dL — AB (ref 70–99)
GLUCOSE-CAPILLARY: 209 mg/dL — AB (ref 70–99)
Glucose-Capillary: 155 mg/dL — ABNORMAL HIGH (ref 70–99)
Glucose-Capillary: 181 mg/dL — ABNORMAL HIGH (ref 70–99)
Glucose-Capillary: 200 mg/dL — ABNORMAL HIGH (ref 70–99)

## 2014-07-13 LAB — TYPE AND SCREEN
ABO/RH(D): A POS
ANTIBODY SCREEN: NEGATIVE
Unit division: 0

## 2014-07-13 LAB — CBC WITH DIFFERENTIAL/PLATELET
BASOS PCT: 1 % (ref 0–1)
Basophils Absolute: 0.1 10*3/uL (ref 0.0–0.1)
EOS PCT: 0 % (ref 0–5)
Eosinophils Absolute: 0 10*3/uL (ref 0.0–0.7)
HEMATOCRIT: 27.1 % — AB (ref 36.0–46.0)
HEMOGLOBIN: 8.9 g/dL — AB (ref 12.0–15.0)
LYMPHS ABS: 0.4 10*3/uL — AB (ref 0.7–4.0)
LYMPHS PCT: 5 % — AB (ref 12–46)
MCH: 30.3 pg (ref 26.0–34.0)
MCHC: 32.8 g/dL (ref 30.0–36.0)
MCV: 92.2 fL (ref 78.0–100.0)
MONOS PCT: 7 % (ref 3–12)
Monocytes Absolute: 0.6 10*3/uL (ref 0.1–1.0)
NEUTROS ABS: 7.3 10*3/uL (ref 1.7–7.7)
Neutrophils Relative %: 87 % — ABNORMAL HIGH (ref 43–77)
Platelets: 223 10*3/uL (ref 150–400)
RBC: 2.94 MIL/uL — AB (ref 3.87–5.11)
RDW: 15.1 % (ref 11.5–15.5)
WBC Morphology: INCREASED
WBC: 8.4 10*3/uL (ref 4.0–10.5)

## 2014-07-13 LAB — MAGNESIUM: Magnesium: 1.9 mg/dL (ref 1.5–2.5)

## 2014-07-13 LAB — TRIGLYCERIDES: TRIGLYCERIDES: 32 mg/dL (ref <150)

## 2014-07-13 LAB — PREALBUMIN: Prealbumin: 15.5 mg/dL — ABNORMAL LOW (ref 17.0–34.0)

## 2014-07-13 LAB — VANCOMYCIN, TROUGH: VANCOMYCIN TR: 17.7 ug/mL (ref 10.0–20.0)

## 2014-07-13 LAB — PHOSPHORUS: Phosphorus: 3.6 mg/dL (ref 2.3–4.6)

## 2014-07-13 MED ORDER — TRACE MINERALS CR-CU-F-FE-I-MN-MO-SE-ZN IV SOLN
INTRAVENOUS | Status: AC
  Administered 2014-07-13: 19:00:00 via INTRAVENOUS
  Filled 2014-07-13: qty 2000

## 2014-07-13 MED ORDER — HYDROCORTISONE NA SUCCINATE PF 100 MG IJ SOLR
25.0000 mg | Freq: Two times a day (BID) | INTRAMUSCULAR | Status: DC
  Administered 2014-07-14 – 2014-07-18 (×10): 25 mg via INTRAVENOUS
  Filled 2014-07-13 (×12): qty 0.5

## 2014-07-13 MED ORDER — POTASSIUM CHLORIDE 10 MEQ/50ML IV SOLN
10.0000 meq | Freq: Once | INTRAVENOUS | Status: AC
  Administered 2014-07-13: 10 meq via INTRAVENOUS

## 2014-07-13 MED ORDER — POTASSIUM CHLORIDE 10 MEQ/50ML IV SOLN
10.0000 meq | INTRAVENOUS | Status: DC
  Filled 2014-07-13: qty 50

## 2014-07-13 MED ORDER — FAT EMULSION 20 % IV EMUL
250.0000 mL | INTRAVENOUS | Status: AC
  Administered 2014-07-13: 250 mL via INTRAVENOUS
  Filled 2014-07-13: qty 250

## 2014-07-13 MED ORDER — POTASSIUM CHLORIDE 10 MEQ/50ML IV SOLN
10.0000 meq | INTRAVENOUS | Status: AC
  Administered 2014-07-13 (×3): 10 meq via INTRAVENOUS
  Filled 2014-07-13: qty 50

## 2014-07-13 MED ORDER — MAGNESIUM SULFATE IN D5W 10-5 MG/ML-% IV SOLN
1.0000 g | Freq: Once | INTRAVENOUS | Status: AC
  Administered 2014-07-13: 1 g via INTRAVENOUS
  Filled 2014-07-13: qty 100

## 2014-07-13 NOTE — Progress Notes (Signed)
[facility] TEAM 1 - Stepdown/ICU TEAM Progress Note  Katelyn Rojas VEL:[id number] DOB: 1941/11/03 DOA: 07/01/2014 PCP: Gildardo Cranker, MD  Admit HPI / Brief Narrative: 73 year old with a PMHx PEA arrest. Because of underlying severe COPD she has become chronically dependent on the ventilator. At some point she had a PEG tube placed by interventional radiologist and was discharged to an [facility]. On 06/17/2014 she was admitted from the LTAC due to concerns of stool draining from the PEG tube. After evaluation it was proven she had a trans-colonic gastrostomy tube. She later underwent extensive surgical repair that included creation of right colostomy and placement of a new open gastrostomy tube and was subsequently discharged back to [facility] on 06/30/2014. She also has a history of MRSA and enterococcus bacteremia and prior tracheitis with cultures positive for Alcaligenes bacteria.   She was sent back to [facility] on 07/01/2014 due to concerns of dislodgment of the recently placed gastrostomy tube. Prior to presentation to the emergency department there were several attempts to replace the tube that were unsuccessful. In the emergency department the previous tube was completely removed by the surgeon and a new tube was placed. Placement was checked via CT scan and unfortunately the replaced tube was not in proper position and it was subsequently removed. Since the stomach was sutured to the anterior abdominal wall it was felt that endoscopic placement of feeding tube would be safest approach. Gastroenterology was consulted and on 07/03/2014 patient underwent successful PEG tube placement. In the interim patient's wound subsequently dehisced and based on appearance (bowel fixed to abdominal wall) and clinical exam it was determined that closing the wound in the OR could not be safely accomplished therefore the wound was covered with an Eakin's pouch. Attempts had been made to initiate tube feedings but  patient experienced nausea and vomiting so the gastric tube was placed to gravity. Overall prognosis poor and the critical care physician had an extensive conversation with the patient's family on 07/08/2014. They did agree to a DO NOT RESUSCITATE but otherwise planned to continue aggressive care.  Subsequently trickle tube feedings were resumed with the addition of Reglan on 8/27. Unfortunately she did not tolerate resumption of tube feedings with noted increased leaking of tube feeding around the insertion site. After evaluation by the surgical team it was determined that the tube feedings need to remain off at this point appear Patient was evaluated on the a.m. of 8/28 patient was continuing to express the increased severity of her abdominal pain (patient even yelled over vent about the severity of her bowel pain ) even though tube feeds have been held. Examination revealed the patient's wound had continued dehisce (now the two separate wounds are one wound), with bowel visible just below minimal layer of granulation tissue. Dr. Sherral Hammers met with the patient's family on 07/11/2014 to discuss patient's prognosis and to review goals of care. Patient remains a DO NOT RESUSCITATE but at this time family wishes to consider continued aggressive care  HPI/Subjective: Alert and continues to endorse inadequate pain control although when compared to Friday, 07/10/2014 she appears more comfortable. No apparent nausea reported.  Assessment/Plan:  Chronic respiratory failure/Dependent on ventilator/Trach/ severe COPD  -Not appropriate to attempt ventilator weaning at this time  Chronic hypotension  -MAP average greater than 65-stress dose steroids were tapered recently from every 6 hours to Q8 hours -no cortisol level was obtained prior to initiation of steroids -has received albumin 50 gm x1 in addition to fluid challenges and short-term  continuous IV fluid  Palliative care meeting -8/29 Dr. Sherral Hammers met with  [name] (niece), [name] (son), and [name] (son/on the phone) after going over in detail the past few days of care, and explaining patient's worsening health despite all efforts. The family has decided they do not want to escalate care. However they would like to continue current level of treatment. Hopefully this would give [name] (son) time to travel from [place] to be at mothers bedside. Therefore it appears we are waiting for the patient's son to arrive from [place] before we change focus to comfort.  Partially compensated severe metabolic alkalosis -9/93 Was started on Acetazolamide IV 100 mg BID-ABG after second dose of Diamox demonstrated slight decrease in serum pH  PEG tube malfunction/Colon perforation/Open wound of abdomen  -Per general surgery-thus far tolerating trickle tube feeding-patient endorsing inadequate pain control so we made an attempt to utilize low-dose oxy IR per tube but since unable to tolerate tube feedings due to leakage and increased residuals this medication was discontinued-attempted IV Dilaudid drip at 0.5 mg per hour but blood pressure dropped to 79 systolic so this was discontinued and instead began concentrated morphine by mouth with 5 mg /0.25 cc continue Reglan-continue TNA noting that the surgical team had a discussion with the patient's niece on 07/13/2014 and frankly explained that TNA is not a long-term option in regards to long-term survival for this patient if she is unable to tolerate further attempts at initiation of enteral feeds via tube.  Edema of right upper extremity  -PICC in same arm-venous duplex preliminary result with no DVT -note definitive dependent component-elevated upper extremities as tolerated  Type II DM  -CBGs variably controlled with a short-term increase during tube feedings and also likely influenced by the fact she remains on stress dose steroids-began low dose long-acting insulin and adjusted sliding scale and now CBGs much  better controlled   Anemia, chronic disease  -Hemoglobin stable around 8-8.5   Hypokalemia  -Repleted via TNA and when necessary IV boluses; continues to remain hypokalemic -Follow closely; suspect metabolic alkalosis contributing  Protein-calorie malnutrition, severe/ On TNA  -Did not tolerate trickle tube feedings -continue TNA   Chronic dementia  -Although patient carries diagnosis of dementia appears to understand her precarious situation. She has told multiple providers that she does not wish to live "this way"  Sacral decubitus ulcer, stage III -Continue treatment per wound care   Dehiscence of abdominal incision -two separate wounds are one wound, with bowel visible just below minimal layer of granulation tissue -Continue treatment per surgery  Code Status: FULL Family Communication: Dr. Sherral Hammers last spoke with family members directly on 07/11/2014; Claiborne Billings the PA from surgery spoke this morning to the patient's niece (SEE ABOVE) Disposition Plan: SDU with full care until patient's son arrives from [place]; then based on the documentation from the palliative meeting from 8/29 it appears our focus is to change to comfort measures  Consultants: Dr. Georganna Skeans (surgery) Dr. Carol Ada (GI) Dr. Chesley Mires [facility] M.)  Culture 6/18 blood cultures x2 with pan sensitive enterococcus 7/6 respiratory culture: ABUNDANT ALCALIGENES XYLOSOXIDANS and MRSA  8/19 urine culture > 100,000 colonies yeast  8/24 blood cultures x2 NGTD   Antibiotics: Cipro 8/5 >>>  Flagyl 8/5 >>>  Vancomycin 8/5 >>>  Fluconazole 8/25 >>>  DVT prophylaxis: Subcutaneous heparin  Devices Chronic trach   LINES / TUBES:  8/6 double-lumen PICC right brachial 8/17 colostomy RLQ  Objective: VITAL SIGNS: Temp: 97.6 F (36.4 C) (08/31 5701)  Temp src: Axillary (08/31 0833) BP: 101/60 mmHg (08/31 0833) Pulse Rate: 69 (08/31 0833)  Intake/Output Summary (Last 24 hours) at 07/13/14 1113 Last data  filed at 07/13/14 1000  Gross per 24 hour  Intake 2772.5 ml  Output   1625 ml  Net 1147.5 ml   Exam: Gen: No acute respiratory distress-alert and able to phonate around trach at time as well as mouth words appropriately  Chest: Coarse to auscultation bilaterally without wheezes, rhonchi or crackles, tracheostomy to ventilator  Cardiac: Regular occasionally tachycardic rate and rhythm, S1-S2, no rubs murmurs or gallops, no peripheral edema, no JVD-diffuse edema consistent with anasarca in a dependent fashion Abdomen: Soft and diffusely tender, right colostomy stoma pink; midline abdominal wound dressed and dry at time of exam  Extremities: without cyanosis, clubbing or effusion-noted diffuse peripheral edema right arm greater than left   Data Reviewed: Basic Metabolic Panel:  Recent Labs Lab 07/07/14 0500 07/09/14 0442 07/10/14 0600 07/10/14 0735 07/11/14 0510 07/11/14 1000 07/12/14 0415 07/13/14 0500  NA 138 140 144 143 142  --  140 138  K 3.9 3.2* 2.9* 3.7 3.5*  --  3.8 3.2*  CL 97 97 99 98 98  --  97 98  CO2 33* 35* 41* 40* 39*  --  34* 33*  GLUCOSE 160* 239* 213* 145* 105*  --  80 160*  BUN 30* 28* 26* 27* 29*  --  31* 33*  CREATININE 0.44* 0.37* 0.41* 0.40* 0.43*  --  0.43* 0.44*  CALCIUM 8.3* 8.0* 8.1* 8.3* 8.3*  --  7.9* 7.7*  MG 2.2 1.8  --  2.0 2.1  --   --  1.9  PHOS  --  2.4  --  1.8*  --  2.6  --  3.6   Liver Function Tests:  Recent Labs Lab 07/09/14 0442 07/10/14 0735 07/13/14 0500  AST 14 15 16   ALT 11 10 9   ALKPHOS 70 73 70  BILITOT 0.3 0.3 0.5  PROT 4.8* 4.5* 4.5*  ALBUMIN 1.8* 1.7* 2.2*   CBC:  Recent Labs Lab 07/10/14 0600 07/12/14 0415 07/13/14 0500  WBC 5.6 7.5 8.4  NEUTROABS  --  6.9 7.3  HGB 7.2* 6.5* 8.9*  HCT 23.5* 20.8* 27.1*  MCV 99.6 98.6 92.2  PLT 255 209 223   Cardiac Enzymes:  Recent Labs Lab 07/10/14 0735  CKTOTAL 23   BNP (last 3 results)  Recent Labs  12/26/13 1130 01/31/14 1540 02/03/14 1535  PROBNP 130.4*  155.9* 129.5*   CBG:  Recent Labs Lab 07/12/14 1619 07/12/14 2026 07/13/14 0043 07/13/14 0549 07/13/14 0754  GLUCAP 153* 110* 191* 181* 200*    Recent Results (from the past 240 hour(s))  CULTURE, BLOOD (ROUTINE X 2)     Status: None   Collection Time    07/06/14  3:45 PM      Result Value Ref Range Status   Specimen Description BLOOD RIGHT HAND   Final   Special Requests BOTTLES DRAWN AEROBIC ONLY 8CC   Final   Culture  Setup Time     Final   Value: 07/06/2014 21:10     Performed at [facility]     Final   Value: NO GROWTH 5 DAYS     Performed at [facility]   Report Status 07/12/2014 FINAL   Final  CULTURE, BLOOD (ROUTINE X 2)     Status: None   Collection Time    07/06/14  4:05 PM  Result Value Ref Range Status   Specimen Description BLOOD LEFT HAND   Final   Special Requests BOTTLES DRAWN AEROBIC AND ANAEROBIC 5CC   Final   Culture  Setup Time     Final   Value: 07/06/2014 21:10     Performed at [facility]   Culture     Final   Value: NO GROWTH 5 DAYS     Performed at [facility]   Report Status 07/12/2014 FINAL   Final     Studies:  Recent x-ray studies have been reviewed in detail by the Attending Physician  Scheduled Meds:  Scheduled Meds: . sodium chloride   Intravenous Once  . acetaZOLAMIDE  100 mg Intravenous Q12H  . antiseptic oral rinse  7 mL Mouth Rinse QID  . chlorhexidine  15 mL Mouth Rinse BID  . ciprofloxacin  400 mg Intravenous Q12H  . fluconazole (DIFLUCAN) IV  100 mg Intravenous Q24H  . heparin  5,000 Units Subcutaneous 3 times per day  . hydrocortisone sodium succinate  50 mg Intravenous Q8H  . insulin aspart  0-15 Units Subcutaneous 6 times per day  . insulin glargine  2 Units Subcutaneous QHS  . magnesium sulfate 1 - 4 g bolus IVPB  1 g Intravenous Once  . metronidazole  500 mg Intravenous Q8H  . pantoprazole (PROTONIX) IV  40 mg Intravenous QHS  . potassium chloride  10 mEq  Intravenous Q1 Hr x 3  . vancomycin  1,000 mg Intravenous Q24H    Time spent on care of this patient: 35 mins   ELLIS,ALLISON L. , ANP   [facility] Office  [phone] Pager - [phone]  On-Call/Text Page:      [email or web address]      password [email or web address]  If 7PM-7AM, please contact night-coverage [email or web address] Password [email or web address] 07/13/2014, 11:13 AM   LOS: 12 days    I have personally examined this patient and reviewed the entire database. I have reviewed the above note, made any necessary editorial changes, and agree with its content.  Cherene Altes, MD [facility]

## 2014-07-13 NOTE — Progress Notes (Signed)
ANTICOAGULATION CONSULT NOTE - Follow Up Consult  Pharmacy Consult for vancomycin Indication: intra-abdominal infection/sacral decubitus ulcer  Labs:  Recent Labs  07/10/14 0735 07/11/14 0510 07/12/14 0415 07/13/14 0500  HGB  --   --  6.5*  --   HCT  --   --  20.8*  --   PLT  --   --  209  --   CREATININE 0.40* 0.43* 0.43* 0.44*  CKTOTAL 23  --   --   --     Assessment/Plan:  73yo female therapeutic on vancomycin for intra-abdominal infection/sacral decubitus ulcer.  Will continue current dose and monitor.  Vernard Gambles, PharmD, BCPS  07/13/2014,6:59 AM

## 2014-07-13 NOTE — Progress Notes (Signed)
PARENTERAL NUTRITION CONSULT NOTE   Pharmacy Consult for TPN Indication: PEG tube malfunction; Intolerance to TF   Patient Measurements: Height:  (154.9 cm) Weight: 124 lb 1.9 oz (56.3 kg) IBW/kg (Calculated) : 47.8   Vital Signs: Temp: 97.6 F (36.4 C) (08/31 0833) Temp src: Axillary (08/31 0833) BP: 101/60 mmHg (08/31 0833) Pulse Rate: 69 (08/31 0833) Intake/Output from previous day: 08/30 0701 - 08/31 0700 In: 2757.5 [I.V.:110; Blood:347.5; IV Piggyback:500; TPN:1800] Out: 2000 [Urine:1725; Drains:200; Stool:75] Intake/Output from this shift: Total I/O In: 250 [IV Piggyback:100; TPN:150] Out: 100 [Urine:100]  Labs:  Recent Labs  07/12/14 0415 07/13/14 0500  WBC 7.5 8.4  HGB 6.5* 8.9*  HCT 20.8* 27.1*  PLT 209 223     Recent Labs  07/11/14 0510 07/11/14 1000 07/12/14 0415 07/13/14 0500  NA 142  --  140 138  K 3.5*  --  3.8 3.2*  CL 98  --  97 98  CO2 39*  --  34* 33*  GLUCOSE 105*  --  80 160*  BUN 29*  --  31* 33*  CREATININE 0.43*  --  0.43* 0.44*  CALCIUM 8.3*  --  7.9* 7.7*  MG 2.1  --   --  1.9  PHOS  --  2.6  --  3.6  PROT  --   --   --  4.5*  ALBUMIN  --   --   --  2.2*  AST  --   --   --  16  ALT  --   --   --  9  ALKPHOS  --   --   --  70  BILITOT  --   --   --  0.5  TRIG  --   --   --  32   Estimated Creatinine Clearance: 48.7 ml/min (by C-G formula based on Cr of 0.44).    Recent Labs  07/13/14 0043 07/13/14 0549 07/13/14 0754  GLUCAP 191* 181* 200*    Insulin Requirements in the past 24 hours:  6 units SSI; Lantus 2 units qHS; 10 units regular insulin in TPN  Current Nutrition:  NPO Goal (TPN alone): Clinimix E 5/15 at 75 ml/hr and 20% lipid emulsion at 36ml/hr Mon and Fri provides daily average of 1415 kcal and 90 gm protein.  Nutritional Goals:   1200-1400 kCal, 90-100 grams of protein per day (goal change per RD note 8/24)  Assessment: 73 year old female from [name]. Chronic vent/trach. D/c from [facility] 8/18 to  [name], PEG noted to be dislodged 8/19. It was replaced at [name] but continued to have drainage and bubbling around tube. Brought to ED for evaluation.  GI: 8/5 - 8/18 > admission to [facility]  8/14: To OR for segmental transverse colectomy, wedge gastrectomy, open Stamm gastrostomy, and End colostomy  8/19: PEG dislodged, attempted to replace at [name]. Drainage at site, to ED.  8/21: PEG replaced in ENDO by Dr Elnoria Howard. Started on TPN per [facility] recs. 8/22: No signs of peritonitis, wait at least 48 hours before starting TF. 8/23: Wound dehiscence, concerning that fistulas may develop.  No TF until 8/24 at the earliest.  8/24: TF (Osmolite 1.2) started at 58ml/hr (goal rate =30 ml/hr). 8/25: Pt actively vomiting; also with black secretions around trach site. Abd distended. TF on hold. Prealbumin up to 23.3 (wnl). 8/26: TF restarted yesterday.  8/27: Although initial residual high, pt tolerating overnight with no residuals this morning. 8/28: PEG tube noted to be leaking so TF  stopped. 8/29: Family meeting held.  Moving to comfort measures.  Awaiting son's arrival from [place]. 8/31 wound still dehisced with visible bowel just under tissue; TF on hold  Endo: DM. She is on chronic steroids- currently tapering stress dose steroids. Had several episodes of hypoglycemia 8/23, which eventually required discontinuation of her TPN that contained insulin. Lantus added 8/27. Will target CBGs <180 to avoid repeat hypoglcemia.   Lytes: K 3.2 mag 1.9, phos 3.6 . Corrected Ca WNL.  Renal: Scr stable,   Hepatobil: LFT's WNL. Trigs 32 (elevated TG=332 on 8/22 due to labs being drawn from TPN port)  TPN Access: 8/6 double-lumen PICC line  TPN day#: 8/10 >> 8/18; resumed 8/21>>  Plan:  Continue Clinimix E 5/15 at 75 ml/hr and continue 20% lipid emulsion 10 ml/hr on Mon and Fri only. This is goal TPN rate which meets 100% of patient's estimated needs.  Give 1 gm Mag bolus and 3 runs of K for repletion and f/u  am labs  Continue CBGs, SSI, and Lantus as ordered. Continue small amount of insulin in TPN bag (pt with receive ~9 units regular insulin daily).  Herby Abraham, Pharm.D. [phone] 07/13/2014 9:38 AM

## 2014-07-13 NOTE — Progress Notes (Signed)
Katelyn Rojas. Corliss Skains, MD, [facility]  General/ Trauma Surgery  07/13/2014 9:03 AM

## 2014-07-13 NOTE — Progress Notes (Signed)
Patient ID: Katelyn Rojas, female   DOB: 14-Jan-1941, 73 y.o.   MRN: [id number] 10 Days Post-Op  Subjective: Niece in the room.  Patient once again speaking over the vent to express her pain  Objective: Vital signs in last 24 hours: Temp:  [97.1 F (36.2 C)-98.6 F (37 C)] 98.1 F (36.7 C) (08/31 0400) Pulse Rate:  [55-112] 69 (08/31 0400) Resp:  [10-20] 18 (08/31 0400) BP: (90-113)/(47-54) 104/52 mmHg (08/31 0400) SpO2:  [98 %-100 %] 98 % (08/31 0400) FiO2 (%):  [30 %] 30 % (08/31 0324) Weight:  [124 lb 1.9 oz (56.3 kg)] 124 lb 1.9 oz (56.3 kg) (08/31 0500) Last BM Date: 07/11/14 (colostomy)  Intake/Output from previous day: 08/30 0701 - 08/31 0700 In: 2182.5 [I.V.:110; Blood:347.5; IV Piggyback:300; TPN:1425] Out: 2000 [Urine:1725; Drains:200; Stool:75] Intake/Output this shift:    PE: Abd: stable, wound is still dehisced, PEG tube in place, ostomy with air, but no other output  Lab Results:   Recent Labs  07/12/14 0415 07/13/14 0500  WBC 7.5 8.4  HGB 6.5* 8.9*  HCT 20.8* 27.1*  PLT 209 223   BMET  Recent Labs  07/12/14 0415 07/13/14 0500  NA 140 138  K 3.8 3.2*  CL 97 98  CO2 34* 33*  GLUCOSE 80 160*  BUN 31* 33*  CREATININE 0.43* 0.44*  CALCIUM 7.9* 7.7*   PT/INR No results found for this basename: LABPROT, INR,  in the last 72 hours CMP     Component Value Date/Time   NA 138 07/13/2014 0500   K 3.2* 07/13/2014 0500   CL 98 07/13/2014 0500   CO2 33* 07/13/2014 0500   GLUCOSE 160* 07/13/2014 0500   BUN 33* 07/13/2014 0500   CREATININE 0.44* 07/13/2014 0500   CALCIUM 7.7* 07/13/2014 0500   PROT 4.5* 07/13/2014 0500   ALBUMIN 2.2* 07/13/2014 0500   AST 16 07/13/2014 0500   ALT 9 07/13/2014 0500   ALKPHOS 70 07/13/2014 0500   BILITOT 0.5 07/13/2014 0500   GFRNONAA >90 07/13/2014 0500   GFRAA >90 07/13/2014 0500   Lipase     Component Value Date/Time   LIPASE 14 07/01/2014 1843       Studies/Results: No results  found.  Anti-infectives: Anti-infectives   Start     Dose/Rate Route Frequency Ordered Stop   07/07/14 1500  fluconazole (DIFLUCAN) IVPB 100 mg     100 mg 50 mL/hr over 60 Minutes Intravenous Every 24 hours 07/06/14 1414     07/06/14 1500  fluconazole (DIFLUCAN) IVPB 200 mg     200 mg 100 mL/hr over 60 Minutes Intravenous  Once 07/06/14 1414 07/06/14 1749   07/02/14 0800  metroNIDAZOLE (FLAGYL) IVPB 500 mg     500 mg 100 mL/hr over 60 Minutes Intravenous Every 8 hours 07/02/14 0054     07/02/14 0600  vancomycin (VANCOCIN) IVPB 1000 mg/200 mL premix     1,000 mg 200 mL/hr over 60 Minutes Intravenous Every 24 hours 07/02/14 0054     07/02/14 0200  ciprofloxacin (CIPRO) IVPB 400 mg     400 mg 200 mL/hr over 60 Minutes Intravenous Every 12 hours 07/02/14 0054         Assessment/Plan   Transcolonic percutaneous gastrostomy tube POD #17 s/p segmental transverse colectomy, wedge gastrectomy, open Stamm gastrostomy, End colostomy  Now with Dislodged G-tube POD #10 s/p replacement of G tube (Dr. Elnoria Howard - 07/03/14)  Wound dehiscence with bowel visible just under the minimal layer of granulation tissue  PCM on TPN, TF on hold  Plan: 1. Niece, Katelyn Rojas was present in the room today when I went by.  I had a long discussion with her about the patient and her overall prognosis as have many other surgeons and physicians.  She asked why I thought the patient couldn't tolerate her tube feeds.  I told her I didn't know for sure, but likely her gut was not working due the multiple operations and procedures she has had.  I also explained this may be her body's way of shutting down.  It is also possible given her poor healing, that she is leaking from one of these holes in her stomach, which there would be nothing to do for this.  She seemed to understand this.  I also discussed with her that long-term TNA is NOT an option to keep this patient alive.  If she continues to not be able to tolerate her tube feeds,  which I suspect is the case, then they, as in the family, need to be aware that long-term TNA is not an option for Katelyn Rojas.  The patient also began to complain of pain while in the room.  It wasn't time for her pain medications.  I took time to express to the niece that the patient had expressed to myself as well as several other practitioners and nurses that she did not want to live this way.  The niece expressed appreciation.  Continue current care for now.  LOS: 12 days    Izsak Meir E 07/13/2014, 7:52 AM Pager: [phone]

## 2014-07-14 DIAGNOSIS — R1013 Epigastric pain: Secondary | ICD-10-CM

## 2014-07-14 DIAGNOSIS — G8929 Other chronic pain: Secondary | ICD-10-CM

## 2014-07-14 DIAGNOSIS — S31109A Unspecified open wound of abdominal wall, unspecified quadrant without penetration into peritoneal cavity, initial encounter: Secondary | ICD-10-CM

## 2014-07-14 LAB — GLUCOSE, CAPILLARY
GLUCOSE-CAPILLARY: 138 mg/dL — AB (ref 70–99)
GLUCOSE-CAPILLARY: 108 mg/dL — AB (ref 70–99)
Glucose-Capillary: 141 mg/dL — ABNORMAL HIGH (ref 70–99)
Glucose-Capillary: 133 mg/dL — ABNORMAL HIGH (ref 70–99)
Glucose-Capillary: 167 mg/dL — ABNORMAL HIGH (ref 70–99)

## 2014-07-14 LAB — BASIC METABOLIC PANEL
Anion gap: 9 (ref 5–15)
BUN: 31 mg/dL — AB (ref 6–23)
CHLORIDE: 101 meq/L (ref 96–112)
CO2: 31 meq/L (ref 19–32)
Calcium: 8.2 mg/dL — ABNORMAL LOW (ref 8.4–10.5)
Creatinine, Ser: 0.43 mg/dL — ABNORMAL LOW (ref 0.50–1.10)
GFR calc Af Amer: >90 mL/min (ref >90)
GFR calc non Af Amer: >90 mL/min (ref >90)
Glucose, Bld: 128 mg/dL — ABNORMAL HIGH (ref 70–99)
Potassium: 3.5 mEq/L — ABNORMAL LOW (ref 3.7–5.3)
Sodium: 141 mEq/L (ref 137–147)

## 2014-07-14 LAB — MAGNESIUM: Magnesium: 2.1 mg/dL (ref 1.5–2.5)

## 2014-07-14 MED ORDER — TRACE MINERALS CR-CU-F-FE-I-MN-MO-SE-ZN IV SOLN
INTRAVENOUS | Status: AC
  Administered 2014-07-14: 18:00:00 via INTRAVENOUS
  Filled 2014-07-14: qty 2000

## 2014-07-14 MED ORDER — POTASSIUM CHLORIDE 10 MEQ/50ML IV SOLN: 10.0000 meq | INTRAVENOUS | Status: DC

## 2014-07-14 MED ORDER — POTASSIUM CHLORIDE 10 MEQ/50ML IV SOLN
10.0000 meq | INTRAVENOUS | Status: AC
  Administered 2014-07-14 (×4): 10 meq via INTRAVENOUS
  Filled 2014-07-14 (×4): qty 50

## 2014-07-14 NOTE — Progress Notes (Signed)
Miss Junious Silk, NP updated on patient's pain status.  She gave me an instruction to give the Oxy IR liquid .25 ml per mouth with Fentanyl.  Oxy IR given PO after patient was notified of the NP's instruction.  Patient verbalized understanding.

## 2014-07-14 NOTE — Progress Notes (Signed)
11 Days Post-Op  Subjective: No change  Objective: Vital signs in last 24 hours: Temp:  [97.3 F (36.3 C)-98.9 F (37.2 C)] 97.3 F (36.3 C) (09/01 0812) Pulse Rate:  [72-100] 85 (09/01 0812) Resp:  [16-30] 16 (09/01 0812) BP: (107-135)/(55-69) 135/69 mmHg (09/01 0812) SpO2:  [95 %-100 %] 96 % (09/01 0812) FiO2 (%):  [30 %] 30 % (09/01 0721) Weight:  [123 lb 7.3 oz (56 kg)] 123 lb 7.3 oz (56 kg) (09/01 0426) Last BM Date: 07/11/14 (colostomy)  Intake/Output from previous day: 08/31 0701 - 09/01 0700 In: 2409.8 [I.V.:350; IV Piggyback:800; TPN:1259.8] Out: 2900 [Urine:2900] Intake/Output this shift: Total I/O In: -  Out: 475 [Urine:300; Drains:175]  Incision/Wound:open with exposed bowel and granulation tissue.   Lab Results:   Recent Labs  07/12/14 0415 07/13/14 0500  WBC 7.5 8.4  HGB 6.5* 8.9*  HCT 20.8* 27.1*  PLT 209 223   BMET  Recent Labs  07/13/14 0500 07/14/14 0500  NA 138 141  K 3.2* 3.5*  CL 98 101  CO2 33* 31  GLUCOSE 160* 128*  BUN 33* 31*  CREATININE 0.44* 0.43*  CALCIUM 7.7* 8.2*   PT/INR No results found for this basename: LABPROT, INR,  in the last 72 hours ABG  Recent Labs  07/12/14 0435  PHART 7.469*  HCO3 35.1*    Studies/Results: No results found.  Anti-infectives: Anti-infectives   Start     Dose/Rate Route Frequency Ordered Stop   07/07/14 1500  fluconazole (DIFLUCAN) IVPB 100 mg     100 mg 50 mL/hr over 60 Minutes Intravenous Every 24 hours 07/06/14 1414     07/06/14 1500  fluconazole (DIFLUCAN) IVPB 200 mg     200 mg 100 mL/hr over 60 Minutes Intravenous  Once 07/06/14 1414 07/06/14 1749   07/02/14 0800  metroNIDAZOLE (FLAGYL) IVPB 500 mg     500 mg 100 mL/hr over 60 Minutes Intravenous Every 8 hours 07/02/14 0054     07/02/14 0600  vancomycin (VANCOCIN) IVPB 1000 mg/200 mL premix     1,000 mg 200 mL/hr over 60 Minutes Intravenous Every 24 hours 07/02/14 0054     07/02/14 0200  ciprofloxacin (CIPRO) IVPB 400 mg      400 mg 200 mL/hr over 60 Minutes Intravenous Every 12 hours 07/02/14 0054        Assessment/Plan:  LOS: 13 days  Patient Active Problem List   Diagnosis Date Noted  . Dependent on ventilator 07/09/2014  . Open wound of abdomen 07/09/2014  . Edema of right upper extremity 07/09/2014  . On TPN with trickle tube feeding 07/09/2014  . Anemia, chronic disease 07/09/2014  . Chronic hypotension 07/09/2014  . Hypokalemia 07/09/2014  . PEG tube malfunction 07/02/2014  . Colon perforation 06/19/2014  . Congestive dilated cardiomyopathy 06/18/2014  . Abdominal pain 06/17/2014  . Tracheostomy status 04/13/2014  . Palliative care encounter 02/05/2014  . Weakness generalized 02/05/2014  . Abnormal CT scan, stomach 02/02/2014  . HCAP (healthcare-associated pneumonia) 01/31/2014  . COPD (chronic obstructive pulmonary disease) 01/13/2014  . Dysphagia, unspecified(787.20) 01/13/2014  . Type II DM 01/13/2014  . Chronic dementia 01/13/2014  . Sacral decubitus ulcer, stage III 01/13/2014  . Compression fracture of thoracic vertebra 01/13/2014  . Chronic respiratory failure 01/13/2014  . Protein-calorie malnutrition, severe 12/31/2013  . Underweight 12/31/2013  . Acute respiratory failure 12/26/2013   Recommend comfort care measures.   Katelyn Mostafa A. 07/14/2014

## 2014-07-14 NOTE — Progress Notes (Signed)
PARENTERAL NUTRITION CONSULT NOTE   Pharmacy Consult for TPN Indication: PEG tube malfunction; Intolerance to TF   Patient Measurements: Height: 5\' 1"  (154.9 cm) Weight: 123 lb 7.3 oz (56 kg) IBW/kg (Calculated) : 47.8   Vital Signs: Temp: 97.3 F (36.3 C) (09/01 0812) Temp src: Axillary (09/01 0812) BP: 135/69 mmHg (09/01 0812) Pulse Rate: 85 (09/01 0812) Intake/Output from previous day: 08/31 0701 - 09/01 0700 In: 2409.8 [I.V.:350; IV Piggyback:800; TPN:1259.8] Out: 2900 [Urine:2900] Intake/Output from this shift: Total I/O In: -  Out: 475 [Urine:300; Drains:175]  Labs:  Recent Labs  07/12/14 0415 07/13/14 0500  WBC 7.5 8.4  HGB 6.5* 8.9*  HCT 20.8* 27.1*  PLT 209 223     Recent Labs  07/11/14 1000 07/12/14 0415 07/13/14 0500 07/14/14 0500  NA  --  140 138 141  K  --  3.8 3.2* 3.5*  CL  --  97 98 101  CO2  --  34* 33* 31  GLUCOSE  --  80 160* 128*  BUN  --  31* 33* 31*  CREATININE  --  0.43* 0.44* 0.43*  CALCIUM  --  7.9* 7.7* 8.2*  MG  --   --  1.9 2.1  PHOS 2.6  --  3.6  --   PROT  --   --  4.5*  --   ALBUMIN  --   --  2.2*  --   AST  --   --  16  --   ALT  --   --  9  --   ALKPHOS  --   --  70  --   BILITOT  --   --  0.5  --   PREALBUMIN  --   --  15.5*  --   TRIG  --   --  32  --    Estimated Creatinine Clearance: 48.7 ml/min (by C-G formula based on Cr of 0.43).    Recent Labs  07/13/14 2017 07/13/14 2340 07/14/14 0423  GLUCAP 181* 155* 141*    Insulin Requirements in the past 24 hours:  11 units SSI; Lantus 2 units qHS; 10 units regular insulin in TPN (pt receiving ~ 9 units/day)  Current Nutrition:  NPO Goal (TPN alone): Clinimix E 5/15 at 75 ml/hr and 20% lipid emulsion at 29ml/hr Mon and Fri provides daily average of 1415 kcal and 90 gm protein.  Nutritional Goals:   1200-1400 kCal, 90-100 grams of protein per day (goal change per RD note 8/24)  Assessment: 73 year old female from [name]. Chronic vent/trach. D/c from  [facility] 8/18 to [name], PEG noted to be dislodged 8/19. It was replaced at [name] but continued to have drainage and bubbling around tube. Brought to ED for evaluation.  GI: 8/5 - 8/18 > admission to [facility]  8/14: To OR for segmental transverse colectomy, wedge gastrectomy, open Stamm gastrostomy, and End colostomy  8/19: PEG dislodged, attempted to replace at [name]. Drainage at site, to ED.  8/21: PEG replaced in ENDO by Dr Elnoria Howard. Started on TPN per [facility] recs. 8/22: No signs of peritonitis, wait at least 48 hours before starting TF. 8/23: Wound dehiscence, concerning that fistulas may develop.  No TF until 8/24 at the earliest.  8/24: TF (Osmolite 1.2) started at 72ml/hr (goal rate =30 ml/hr). 8/25: Pt actively vomiting; also with black secretions around trach site. Abd distended. TF on hold. Prealbumin up to 23.3 (wnl). 8/26: TF restarted yesterday.  8/27: Although initial residual high, pt tolerating overnight with  no residuals this morning. 8/28: PEG tube noted to be leaking so TF stopped. 8/29: Family meeting held.  Moving to comfort measures.  Awaiting son's arrival from [place]. 8/31 wound still dehisced with visible bowel just under tissue; TF on hold  Endo: DM. She is on chronic steroids- currently tapering stress dose steroids. Had several episodes of hypoglycemia 8/23, which eventually required discontinuation of her TPN that contained insulin. Lantus added 8/27. Will target CBGs <180 to avoid repeat hypoglcemia. CBGs 128 - 181 - acceptable.   Lytes: K 3.5 after 3 runs k yest,  mag 2.1 after 1 gm mag yesterday . Corrected Ca WNL.  Renal: Scr stable,   Hepatobil: LFT's WNL. Trigs 32 (elevated TG=332 on 8/22 due to labs being drawn from TPN port)  TPN Access: 8/6 double-lumen PICC line  TPN day#: 8/10 >> 8/18; resumed 8/21>>  Plan:  Continue Clinimix E 5/15 at 75 ml/hr and continue 20% lipid emulsion 10 ml/hr on Mon and Fri only. This is goal TPN rate which meets 100% of patient's  estimated needs.  Give another 3 runs of K for repletion and f/u am labs  Continue CBGs, SSI, and Lantus as ordered. Continue small amount of insulin in TPN bag (pt with receive ~9 units regular insulin daily).  Herby Abraham, Pharm.D. [phone] 07/14/2014 8:27 AM

## 2014-07-14 NOTE — Progress Notes (Signed)
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 Because of underlying severe COPD she has become chronically dependent on the ventilator. At some point she had a PEG tube placed by interventional radiologist and was discharged to an [facility]. On 06/17/2014 she was admitted from the LTAC due to concerns of stool draining from the PEG tube. After evaluation it was proven she had a trans-colonic gastrostomy tube. She later underwent extensive surgical repair that included creation of right colostomy and placement of a new open gastrostomy tube and was subsequently discharged back to [place] on 06/30/2014. She also has a history of MRSA and enterococcus bacteremia and prior tracheitis with cultures positive for Alcaligenes bacteria.   She was sent back to [facility] on 07/01/2014 due to concerns of dislodgment of the recently placed gastrostomy tube. Prior to presentation to the emergency department there were several attempts to replace the tube that were unsuccessful. In the emergency department the previous tube was completely removed by the surgeon and a new tube was placed. Placement was checked via CT scan and unfortunately the replaced tube was not in proper position and it was subsequently removed. Since the stomach was sutured to the anterior abdominal wall it was felt that endoscopic placement of feeding tube would be safest approach. Gastroenterology was consulted and on 07/03/2014 patient underwent successful PEG tube placement. In the interim patient's wound subsequently dehisced and based on appearance (bowel fixed to abdominal wall) and clinical exam it was determined that closing the wound in the OR could not be safely accomplished therefore the wound was covered with an Eakin's pouch. Attempts had been made to initiate tube feedings but  patient experienced nausea and vomiting so the gastric tube was placed to gravity. Overall prognosis poor and the critical care physician had an extensive conversation with the patient's family on 07/08/2014. They did agree to a DO NOT RESUSCITATE but otherwise planned to continue aggressive care.  Subsequently trickle tube feedings were resumed with the addition of Reglan on 8/27. Unfortunately she did not tolerate resumption of tube feedings with noted increased leaking of tube feeding around the insertion site. After evaluation by the surgical team it was determined that the tube feedings need to remain off at this point appear Patient was evaluated on the a.m. of 8/28 patient was continuing to express the increased severity of her abdominal pain (patient even yelled over vent about the severity of her bowel pain ) even though tube feeds have been held. Examination revealed the patient's wound had continued dehisce (now the two separate wounds are one wound), with bowel visible just below minimal layer of granulation tissue. Dr. Sherral Hammers met with the patient's family on 07/11/2014 to discuss patient's prognosis and to review goals of care. Patient remains a DO NOT RESUSCITATE but at this time family wishes to consider continued aggressive care  HPI/Subjective: Alert , poor pain control but med not administered as ordered (see below) so was clarified  Assessment/Plan:  Chronic respiratory failure/Dependent on ventilator/Trach/ severe COPD  -Not appropriate to attempt ventilator weaning at this time  Chronic hypotension  -MAP average greater than 65-stress dose steroids were tapered recently from every 6 hours to Q8 hours -no cortisol level was obtained prior to initiation of steroids -has received albumin 50 gm x1 in addition to fluid challenges and short-term continuous IV fluid  Palliative care  meeting -8/29 Dr. Sherral Hammers met with [name] (niece), [name] (son), and [name] (son/on the phone) after going  over in detail the past few days of care, and explaining patient's worsening health despite all efforts. The family has decided they do not want to escalate care. However they would like to continue current level of treatment. Hopefully this would give [name] (son) time to travel from [place] to be at mothers bedside. Therefore it appears we are waiting for the patient's son to arrive from [place] before we change focus to comfort.  Partially compensated severe metabolic alkalosis -9/73 Was started on Acetazolamide IV 100 mg BID-ABG after second dose of Diamox demonstrated slight decrease in serum pH  PEG tube malfunction/Colon perforation/Open wound of abdomen  -Per general surgery-patient endorsing inadequate pain control so we made an attempt to utilize low-dose oxy IR per tube but since unable to tolerate tube feedings due to leakage and increased residuals this medication was discontinued-attempted IV Dilaudid drip at 0.5 mg per hour but blood pressure dropped to 79 systolic so this was discontinued and instead began concentrated morphine by mouth with 5 mg /0.25 cc continue Reglan-continue TNA noting that the surgical team had a discussion with the patient's niece on 07/13/2014 and frankly explained that TNA is not a long-term option in regards to long-term survival for this patient if she is unable to tolerate further attempts at initiation of enteral feeds via tube. Nursing was giving the concentrated Morphine per tube instead by mouth -explained goal is for oral absorption and should be given PO noting PEG to straight drain and gut not functioning at this time  Edema of right upper extremity  -PICC in same arm-venous duplex no DVT -note definitive dependent component-elevated upper extremities as tolerated  Type II DM  -CBGs variably controlled and likely influenced by the fact she remains on stress dose steroids-began low dose long-acting insulin and adjusted sliding scale and now CBGs  much better controlled   Anemia, chronic disease  -Hemoglobin stable around 8-8.5   Hypokalemia  -Repleted via TNA and when necessary IV boluses; continues to remain hypokalemic -Follow closely; suspect metabolic alkalosis contributing  Protein-calorie malnutrition, severe/ On TNA  -Did not tolerate trickle tube feedings-continue TNA   Chronic dementia  -Although patient carries diagnosis of dementia appears to understand her precarious situation. She has told multiple providers that she does not wish to live "this way"  Sacral decubitus ulcer, stage III -Continue treatment per wound care per surgery recs   Dehiscence of abdominal incision -two separate wounds are one wound, with bowel visible just below minimal layer of granulation tissue-Continue treatment per surgery  Chronic pain -Secondary to Dehiscence of abdominal incision, and lack of functioning of GI tract.  Code Status:DNR Family Communication: Dr. Sherral Hammers last spoke with family members directly on 07/11/2014; Claiborne Billings the PA from surgery spoke this morning to the patient's niece (SEE ABOVE)  Disposition Plan: SDU with full care until patient's son arrives from [place]; then based on the documentation from the palliative meeting from 8/29 it appears our focus is to change to comfort measures  Consultants: Dr. Georganna Skeans (surgery) Dr. Carol Ada (GI) Dr. Chesley Mires [facility] M.)  Culture 6/18 blood cultures x2 with pan sensitive enterococcus 7/6 respiratory culture: ABUNDANT ALCALIGENES XYLOSOXIDANS and MRSA  8/19 urine culture > 100,000 colonies yeast  8/24 blood cultures x2 NGTD   Antibiotics: Cipro 8/5 >>>  Flagyl 8/5 >>>  Vancomycin 8/5 >>>  Fluconazole 8/25 >>>  DVT prophylaxis: Subcutaneous heparin  Devices Chronic trach   LINES / TUBES:  8/6 double-lumen PICC right brachial 8/17 colostomy RLQ  Objective: VITAL SIGNS: Temp: 97.3 F (36.3 C) (09/01 0812) Temp src: Axillary (09/01 0812) BP:  135/69 mmHg (09/01 0812) Pulse Rate: 85 (09/01 0812)  Intake/Output Summary (Last 24 hours) at 07/14/14 1056 Last data filed at 07/14/14 1035  Gross per 24 hour  Intake   3150 ml  Output   3275 ml  Net   -125 ml   Exam: Gen: No acute respiratory distress-alert  Chest: Coarse to auscultation bilaterally L > R, tracheostomy to ventilator  Cardiac: Regular occasionally tachycardic rate and rhythm, S1-S2, no rubs murmurs or gallops, no peripheral edema, no JVD-diffuse edema consistent with anasarca in a dependent fashion Abdomen: Soft and diffusely tender, right colostomy stoma pink; midline abdominal wound dressed and dry at time of exam -PEG to st drain w/ green bilious returns Extremities: without cyanosis, clubbing or effusion-noted diffuse peripheral edema right arm greater than left   Data Reviewed: Basic Metabolic Panel:  Recent Labs Lab 07/09/14 0442  07/10/14 0735 07/11/14 0510 07/11/14 1000 07/12/14 0415 07/13/14 0500 07/14/14 0500  NA 140  < > 143 142  --  140 138 141  K 3.2*  < > 3.7 3.5*  --  3.8 3.2* 3.5*  CL 97  < > 98 98  --  97 98 101  CO2 35*  < > 40* 39*  --  34* 33* 31  GLUCOSE 239*  < > 145* 105*  --  80 160* 128*  BUN 28*  < > 27* 29*  --  31* 33* 31*  CREATININE 0.37*  < > 0.40* 0.43*  --  0.43* 0.44* 0.43*  CALCIUM 8.0*  < > 8.3* 8.3*  --  7.9* 7.7* 8.2*  MG 1.8  --  2.0 2.1  --   --  1.9 2.1  PHOS 2.4  --  1.8*  --  2.6  --  3.6  --   < > = values in this interval not displayed. Liver Function Tests:  Recent Labs Lab 07/09/14 0442 07/10/14 0735 07/13/14 0500  AST 14 15 16   ALT 11 10 9   ALKPHOS 70 73 70  BILITOT 0.3 0.3 0.5  PROT 4.8* 4.5* 4.5*  ALBUMIN 1.8* 1.7* 2.2*   CBC:  Recent Labs Lab 07/10/14 0600 07/12/14 0415 07/13/14 0500  WBC 5.6 7.5 8.4  NEUTROABS  --  6.9 7.3  HGB 7.2* 6.5* 8.9*  HCT 23.5* 20.8* 27.1*  MCV 99.6 98.6 92.2  PLT 255 209 223   Cardiac Enzymes:  Recent Labs Lab 07/10/14 0735  CKTOTAL 23   BNP  (last 3 results)  Recent Labs  12/26/13 1130 01/31/14 1540 02/03/14 1535  PROBNP 130.4* 155.9* 129.5*   CBG:  Recent Labs Lab 07/13/14 1707 07/13/14 2017 07/13/14 2340 07/14/14 0423 07/14/14 0811  GLUCAP 186* 181* 155* 141* 133*    Recent Results (from the past 240 hour(s))  CULTURE, BLOOD (ROUTINE X 2)     Status: None   Collection Time    07/06/14  3:45 PM      Result Value Ref Range Status   Specimen Description BLOOD RIGHT HAND   Final   Special Requests BOTTLES DRAWN AEROBIC ONLY 8CC   Final   Culture  Setup Time     Final   Value: 07/06/2014 21:10     Performed at [facility]   Culture     Final   Value:  NO GROWTH 5 DAYS     Performed at [facility]   Report Status 07/12/2014 FINAL   Final  CULTURE, BLOOD (ROUTINE X 2)     Status: None   Collection Time    07/06/14  4:05 PM      Result Value Ref Range Status   Specimen Description BLOOD LEFT HAND   Final   Special Requests BOTTLES DRAWN AEROBIC AND ANAEROBIC 5CC   Final   Culture  Setup Time     Final   Value: 07/06/2014 21:10     Performed at [facility]   Culture     Final   Value: NO GROWTH 5 DAYS     Performed at [facility]   Report Status 07/12/2014 FINAL   Final     Studies:  Recent x-ray studies have been reviewed in detail by the Attending Physician  Scheduled Meds:  Scheduled Meds: . sodium chloride   Intravenous Once  . acetaZOLAMIDE  100 mg Intravenous Q12H  . antiseptic oral rinse  7 mL Mouth Rinse QID  . chlorhexidine  15 mL Mouth Rinse BID  . ciprofloxacin  400 mg Intravenous Q12H  . fluconazole (DIFLUCAN) IV  100 mg Intravenous Q24H  . heparin  5,000 Units Subcutaneous 3 times per day  . hydrocortisone sodium succinate  25 mg Intravenous Q12H  . insulin aspart  0-15 Units Subcutaneous 6 times per day  . insulin glargine  2 Units Subcutaneous QHS  . metronidazole  500 mg Intravenous Q8H  . pantoprazole (PROTONIX) IV  40 mg Intravenous QHS  .  potassium chloride  10 mEq Intravenous Q1 Hr x 4  . vancomycin  1,000 mg Intravenous Q24H    Time spent on care of this patient: 35 mins   ELLIS,ALLISON L. , ANP   [facility] Office  [phone] Pager - [phone]  On-Call/Text Page:      [email or web address]      password [email or web address]  If 7PM-7AM, please contact night-coverage [email or web address] Password [email or web address] 07/14/2014, 10:56 AM   LOS: 13 days  Examined patient and discussed assessment and plan with ANP Ebony Hail and agree with the above plan. Family was present and should all questions.  Patient with multiple complex medical issues> 40 minutes spent in direct patient care Addendum;  at approximately 1830 received a phone call from [facility] stating that the son in [place] would like a phone call telling him when she should come to visit his mother. It should be noted that on 8/29 I spent close to 2 hours speaking with [name] (niece), [name] (son), and [name] (son/on the phone in [place]) and explained in detail that patient was gravely ill and without the continued extremity measures would not survive more than an estimated 2-3 days. At that time [facility] requested that we continue our current level of treatment in order to allow him time to fly in from [place] to visit. Per NCM Kurtis Bushman now would like another phone call detail and when he should flying in to see his mother.

## 2014-07-14 NOTE — Consult Note (Addendum)
WOC follow-up:  CCS team following for assessment and plan of care for abd wound.  Unchanged in size and appearance from previous consult. Mepitel contact layer to protect over exposed bowel and moist gauze dressing applied.  Large amt yellow drainage from site leaking. Bedside nurses changing dressings BID over the site. Changed kayara pouch.  Stoma red and viable, 1 3/4 inches and above skin level.  Minimal amt brown liquid, no formed stool. Supplies at bedside for staff nurse use. No family present. Cammie Mcgee MSN, RN, CWOCN, [place], CNS  [phone]

## 2014-07-15 LAB — GLUCOSE, CAPILLARY
GLUCOSE-CAPILLARY: 114 mg/dL — AB (ref 70–99)
Glucose-Capillary: 117 mg/dL — ABNORMAL HIGH (ref 70–99)
Glucose-Capillary: 121 mg/dL — ABNORMAL HIGH (ref 70–99)
Glucose-Capillary: 123 mg/dL — ABNORMAL HIGH (ref 70–99)
Glucose-Capillary: 145 mg/dL — ABNORMAL HIGH (ref 70–99)
Glucose-Capillary: 96 mg/dL (ref 70–99)

## 2014-07-15 LAB — BASIC METABOLIC PANEL
ANION GAP: 7 (ref 5–15)
BUN: 34 mg/dL — ABNORMAL HIGH (ref 6–23)
CHLORIDE: 104 meq/L (ref 96–112)
CO2: 33 meq/L — AB (ref 19–32)
Calcium: 8.2 mg/dL — ABNORMAL LOW (ref 8.4–10.5)
Creatinine, Ser: 0.39 mg/dL — ABNORMAL LOW (ref 0.50–1.10)
GFR calc Af Amer: >90 mL/min (ref >90)
GFR calc non Af Amer: >90 mL/min (ref >90)
Glucose, Bld: 86 mg/dL (ref 70–99)
Potassium: 5.8 mEq/L — ABNORMAL HIGH (ref 3.7–5.3)
SODIUM: 144 meq/L (ref 137–147)

## 2014-07-15 LAB — POTASSIUM: Potassium: 3.6 mEq/L — ABNORMAL LOW (ref 3.7–5.3)

## 2014-07-15 MED ORDER — OXYCODONE HCL 20 MG/ML PO CONC
10.0000 mg | ORAL | Status: DC | PRN
  Administered 2014-07-16: 10 mg via ORAL
  Filled 2014-07-15: qty 1

## 2014-07-15 MED ORDER — M.V.I. ADULT IV INJ
INTRAVENOUS | Status: AC
  Administered 2014-07-15: 18:00:00 via INTRAVENOUS
  Filled 2014-07-15: qty 2000

## 2014-07-15 MED ORDER — HYDROMORPHONE HCL PF 1 MG/ML IJ SOLN
1.0000 mg | Freq: Two times a day (BID) | INTRAMUSCULAR | Status: DC | PRN
  Administered 2014-07-15 – 2014-07-16 (×3): 1 mg via INTRAVENOUS
  Filled 2014-07-15 (×3): qty 1

## 2014-07-15 MED ORDER — TRACE MINERALS CR-CU-F-FE-I-MN-MO-SE-ZN IV SOLN
INTRAVENOUS | Status: DC
  Filled 2014-07-15: qty 2000

## 2014-07-15 MED ORDER — SODIUM CHLORIDE 0.9 % IV BOLUS (SEPSIS)
500.0000 mL | Freq: Once | INTRAVENOUS | Status: AC
  Administered 2014-07-15: 500 mL via INTRAVENOUS

## 2014-07-15 NOTE — Progress Notes (Signed)
NUTRITION FOLLOW UP  DOCUMENTATION CODES Per approved criteria  -Severe malnutrition in the context of chronic illness   INTERVENTION:  TPN per pharmacy RD to follow for nutrition care plan  NUTRITION DIAGNOSIS: Inadequate oral intake related to inability to eat as evidenced by NPO status, ongoing  Goal: Pt to meet >/= 90% of their estimated nutrition needs, met  Monitor:  TPN prescription, goals of care, respiratory status, weight, labs, I/O's   ASSESSMENT: 73 year old female from [name]. Chronic vent/trach. D/c from [facility] 8/18 to [name], PEG noted to be dislodged 8/19. It was replaced at [name] but continued to have drainage and bubbling around tube. Brought to ED for evaluation. PCCM called for admission.   8/24:  PEG tube ready to be used.  Surgery note reviewed.  Pt c/o pain over abdomen & nausea.  RD consulted for TF initiation & management.  Will advance slowly.  Patient is currently on ventilator support -- trach MV: 5.6 L/min Temp (24hrs), Avg:97.4 F (36.3 C), Min:96.5 F (35.8 C), Max:97.9 F (36.6 C)   Patient is receiving TPN with Clinimix E 5/15 @ 80 ml/hr and lipids @ 10 ml/hr. Provides kcal 1843 and 96 grams protein per day. Meets 153% minimum estimated energy needs and 100% minimum estimated protein needs.  8/27:   Noted patient with significant pain/bloating in abdomen along with vomiting.  TF via PEG tube held 8/25.  Reglan added.  TF resumed last night.  Osmolite 1.2 formula currently infusing at 15 ml/hr providing 432 kcals, 20 gm protein, 295 ml of free water.  Grim prognosis.  Patient is receiving TPN with Clinimix E 5/15 @ 65 ml/hr and lipids @ 10 ml/hr.  Provides 1588 kcal and 78 grams protein per day. Meets 132% minimum estimated energy needs and 86% minimum estimated protein needs.  9/2:  Osmolite 1.2 formula via PEG tube discontinued 8/29.    Patient is receiving TPN with Clinimix E 5/15 @ 75 ml/hr and 20% IVFE at 10 ml/hr on Mon and Fri only.   Provides daily average of 1415 kcal and 90 grams protein.  Meets 100% minimum estimated energy needs and 100% minimum estimated protein needs.  Chart reviewed.  Family meeting held 8/29 -- they do not want to escalate care.  Awaiting for son from [place] to arrive to [facility] before transition to comfort care.  Height: Ht Readings from Last 1 Encounters:  07/02/14 5' 1" (1.549 m)    Weight: Wt Readings from Last 1 Encounters:  07/15/14 123 lb 7.3 oz (56 kg)    BMI:  Body mass index is 23.34 kg/(m^2).  Re-estimated Nutritional Needs: Kcal: 1200-1400 Protein: 90-100 gm Fluid: >/= 1.5 L1  Skin: Stage IV sacral ulcer, abdominal full thickness wound with exposed bowel & drainage  Diet Order: NPO   Intake/Output Summary (Last 24 hours) at 07/15/14 1510 Last data filed at 07/15/14 1223  Gross per 24 hour  Intake   1855 ml  Output   2475 ml  Net   -620 ml    Labs:   Recent Labs Lab 07/10/14 0735 07/11/14 0510 07/11/14 1000  07/13/14 0500 07/14/14 0500 07/15/14 0235 07/15/14 0812  NA 143 142  --   < > 138 141 144  --   K 3.7 3.5*  --   < > 3.2* 3.5* 5.8* 3.6*  CL 98 98  --   < > 98 101 104  --   CO2 40* 39*  --   < > 33* 31 33*  --  BUN 27* 29*  --   < > 33* 31* 34*  --   CREATININE 0.40* 0.43*  --   < > 0.44* 0.43* 0.39*  --   CALCIUM 8.3* 8.3*  --   < > 7.7* 8.2* 8.2*  --   MG 2.0 2.1  --   --  1.9 2.1  --   --   PHOS 1.8*  --  2.6  --  3.6  --   --   --   GLUCOSE 145* 105*  --   < > 160* 128* 86  --   < > = values in this interval not displayed.  CBG (last 3)   Recent Labs  07/15/14 0442 07/15/14 0811 07/15/14 1156  GLUCAP 114* 117* 121*    Scheduled Meds: . sodium chloride   Intravenous Once  . acetaZOLAMIDE  100 mg Intravenous Q12H  . antiseptic oral rinse  7 mL Mouth Rinse QID  . chlorhexidine  15 mL Mouth Rinse BID  . ciprofloxacin  400 mg Intravenous Q12H  . fluconazole (DIFLUCAN) IV  100 mg Intravenous Q24H  . heparin  5,000 Units Subcutaneous 3 times  per day  . hydrocortisone sodium succinate  25 mg Intravenous Q12H  . insulin aspart  0-15 Units Subcutaneous 6 times per day  . metronidazole  500 mg Intravenous Q8H  . pantoprazole (PROTONIX) IV  40 mg Intravenous QHS  . vancomycin  1,000 mg Intravenous Q24H    Continuous Infusions: . sodium chloride 1,000 mL (07/15/14 1045)  . Marland KitchenTPN (CLINIMIX-E) Adult 75 mL/hr at 07/14/14 1733  . Marland KitchenTPN (CLINIMIX-E) Adult      Past Medical History  Diagnosis Date  . Hypertension   . 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 Arthritis   . Anxiety   . Cachexia 12/2013  . DM type 2 (diabetes mellitus, type 2)   . 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 Anemia 12/2013  . Dementia   . Compression fracture of lumbar vertebra, non-traumatic 12/2013    L3 and L1.     Past Surgical History  Procedure Laterality Date  . Vaginal hysterectomy      ? Abdominal vs. Vaginal  . Laparotomy N/A 06/26/2014    Procedure: EXPLORATORY LAPAROTOMY;  Surgeon: Rolm Bookbinder, MD;  Location: [place];  Service: General;  Laterality: N/A;  . Partial colectomy N/A 06/26/2014    Procedure: TRANSVERSE COLECTOMY/COLOSTOMY;  Surgeon: Rolm Bookbinder, MD;  Location: [place];  Service: General;  Laterality: N/A;  . Gastrostomy N/A 06/26/2014    Procedure: NEW GASTROSTOMY TUBE;  Surgeon: Rolm Bookbinder, MD;  Location: [place];  Service: General;  Laterality: N/A;  . Peg placement N/A 07/03/2014    Procedure: PERCUTANEOUS ENDOSCOPIC GASTROSTOMY (PEG) PLACEMENT;  Surgeon: Beryle Beams, MD;  Location: [place];  Service: Endoscopy;  Laterality: N/A;    Arthur Holms, RD, LDN Pager #: [phone] After-Hours Pager #: [phone]

## 2014-07-15 NOTE — Progress Notes (Addendum)
PARENTERAL NUTRITION CONSULT NOTE - FOLLOW UP  Pharmacy Consult: TPN Indication: PEG tube malfunction; Intolerance to TF   Patient Measurements: Height:  (154.9 cm) Weight: 123 lb 7.3 oz (56 kg) IBW/kg (Calculated) : 47.8  Vital Signs: Temp: 97.9 F (36.6 C) (09/02 0448) Temp src: Axillary (09/02 0448) BP: 79/50 mmHg (09/02 0654) Pulse Rate: 100 (09/02 0448) Intake/Output from previous day: 09/01 0701 - 09/02 0700 In: 2610 [IV Piggyback:850; TPN:1760] Out: 2175 [Urine:2000; Drains:175]  Labs:  Recent Labs  07/13/14 0500  WBC 8.4  HGB 8.9*  HCT 27.1*  PLT 223     Recent Labs  07/13/14 0500 07/14/14 0500 07/15/14 0235  NA 138 141 144  K 3.2* 3.5* 5.8*  CL 98 101 104  CO2 33* 31 33*  GLUCOSE 160* 128* 86  BUN 33* 31* 34*  CREATININE 0.44* 0.43* 0.39*  CALCIUM 7.7* 8.2* 8.2*  MG 1.9 2.1  --   PHOS 3.6  --   --   PROT 4.5*  --   --   ALBUMIN 2.2*  --   --   AST 16  --   --   ALT 9  --   --   ALKPHOS 70  --   --   BILITOT 0.5  --   --   PREALBUMIN 15.5*  --   --   TRIG 32  --   --    Estimated Creatinine Clearance: 48.7 ml/min (by C-G formula based on Cr of 0.39).    Recent Labs  07/14/14 1956 07/15/14 0020 07/15/14 0442  GLUCAP 108* 96 114*     Insulin Requirements in the past 24 hours:  12 units moderate SSI; Lantus 2 units qHS; 10 units regular insulin in TPN  Assessment: 73 year old female transferred from [name] with PEG malfunctioning.  Pharmacy consulted to manage TPN for nutritional support.  GI: 8/5 - 8/18 > admission to [facility]  8/14: OR for segmental transverse colectomy, wedge gastrectomy, open Stamm gastrostomy, and end colostomy  8/19: PEG dislodged, attempted to replace at [name]. Drainage at site, to ED 8/21: PEG replaced in ENDO by Dr Elnoria Howard. Started on TPN per [facility] recs. 8/22: No signs of peritonitis, wait at least 48 hours before starting TF 8/23: Wound dehiscence, concerning that fistulas may develop.  No TF until 8/24 at  the earliest.  8/24: TF (Osmolite 1.2) started at 41ml/hr (goal rate =30 ml/hr). 8/25: Actively vomiting; also with black secretions around trach site. Abd distended. TF on hold. Prealbumin up to 23.3 (wnl). 8/26: TF restarted 8/25  8/27: Although initial residual high, pt tolerated overnight with no residuals this morning 8/28: PEG tube noted to be leaking so TF stopped. 8/29: Family meeting held.  Moving to comfort measures.  Awaiting son's arrival from [place]. 8/31: wound still dehisced with visible bowel just under tissue; TF on hold - prealbumin trended down, slightly below normal range  Cards: hypotensive, some elevated HR Endo: DM - had several episodes of hypoglycemia 8/23 >> insulin removed from TPN >> CBGs elevated and Lantus added 8/27. Will target CBGs <180 to avoid repeat hypoglcemia, CBGs controlled - tapering steroid Lytes: K+ elevated at 5.8 (received 4 runs KCL yesterday, ?hemolysis), CO2 mildly elevated, others WNL Renal: SCr stable - acetazolamide, great UOP 1.5 ml/kg/hr Hepatobil: LFT's WNL. TG 32 (TG 332 on 8/22 d/t labs being drawn from TPN port) ID: Cipro/Diflucan/Flagyl/ Vanc for intra-abd infxn, afebrile Pulm: intubated, FiO2 30% Best Practices: heparin SQ, PPI IV TPN Access: 8/6  double-lumen PICC line TPN day#: 8/10 >> 8/18; resumed 8/21>>  Current Nutrition:  TPN  Nutritional Goals:   1200-1400 kCal, 90-100 grams of protein per day   Plan:  - Continue Clinimix E 5/15 at 75 ml/hr + IVFE at 10 ml/hr on Mon and Fri only.  TPN provides a daily average of 1415 kCal and 90gm of protein daily, meeting 100% of patient's needs. - D/C Lantus since ordered amount is minute and CBGs are trending down - Continue SSI and increase insulin in TPN to 13 units (will increase if CBGs trend up with the discontinuation of Lantus) - Check labs in AM, watch K+ closely - Awaiting son's arrival to proceed with comfort measures    Daylee Delahoz D. Laney Potash, PharmD, BCPS Pager:  [phone] 07/15/2014, 7:43 AM

## 2014-07-15 NOTE — Progress Notes (Signed)
[facility] Progress Note 12 Days Post-Op  Subjective: Patient unresponsive.  Looks very miserable on the ventilator.  Exposed bowel in the midline  Objective: Vital signs in last 24 hours: Temp:  [96.6 F (35.9 C)-97.9 F (36.6 C)] 97.9 F (36.6 C) (09/02 0448) Pulse Rate:  [85-123] 100 (09/02 0448) Resp:  [16-23] 18 (09/02 0448) BP: (79-143)/(50-71) 79/50 mmHg (09/02 0654) SpO2:  [95 %-98 %] 97 % (09/02 0448) FiO2 (%):  [30 %] 30 % (09/02 0750) Weight:  [56 kg (123 lb 7.3 oz)] 56 kg (123 lb 7.3 oz) (09/02 0448) Last BM Date: 07/11/14 (colostomy)  Intake/Output from previous day: 09/01 0701 - 09/02 0700 In: 2610 [IV Piggyback:850; TPN:1760] Out: 2175 [Urine:2000; Drains:175] Intake/Output this shift:    General: Looks miserable.  Lungs: Clear, sats are okay.  Abd: Distended, good bowel sounds, stoma is viable  Extremities: Edematous, weeping.  Neuro: Somnolent.  Lab Results:  @LABLAST2 (wbc:2,hgb:2,hct:2,plt:2) BMET  Recent Labs  07/14/14 0500 07/15/14 0235  NA 141 144  K 3.5* 5.8*  CL 101 104  CO2 31 33*  GLUCOSE 128* 86  BUN 31* 34*  CREATININE 0.43* 0.39*  CALCIUM 8.2* 8.2*   PT/INR No results found for this basename: LABPROT, INR,  in the last 72 hours ABG No results found for this basename: PHART, PCO2, PO2, HCO3,  in the last 72 hours  Studies/Results: No results found.  Anti-infectives: Anti-infectives   Start     Dose/Rate Route Frequency Ordered Stop   07/07/14 1500  fluconazole (DIFLUCAN) IVPB 100 mg     100 mg 50 mL/hr over 60 Minutes Intravenous Every 24 hours 07/06/14 1414     07/06/14 1500  fluconazole (DIFLUCAN) IVPB 200 mg     200 mg 100 mL/hr over 60 Minutes Intravenous  Once 07/06/14 1414 07/06/14 1749   07/02/14 0800  metroNIDAZOLE (FLAGYL) IVPB 500 mg     500 mg 100 mL/hr over 60 Minutes Intravenous Every 8 hours 07/02/14 0054     07/02/14 0600  vancomycin (VANCOCIN) IVPB 1000 mg/200 mL premix     1,000 mg 200 mL/hr over 60  Minutes Intravenous Every 24 hours 07/02/14 0054     07/02/14 0200  ciprofloxacin (CIPRO) IVPB 400 mg     400 mg 200 mL/hr over 60 Minutes Intravenous Every 12 hours 07/02/14 0054        Assessment/Plan: s/p Procedure(s): PERCUTANEOUS ENDOSCOPIC GASTROSTOMY (PEG) PLACEMENT Family meeting probably set for Friday  LOS: 14 days   Marta Lamas. Gae Bon, MD, FACS [phone] [facility] Surgery 07/15/2014

## 2014-07-15 NOTE — Progress Notes (Signed)
Above events noted.  Pt not a surgical candidate.  Will sign off.

## 2014-07-15 NOTE — Progress Notes (Signed)
[facility] TEAM 1 - Stepdown/ICU TEAM Progress Note  Katelyn Rojas TKW:[id number] DOB: February 03, 1941 DOA: 07/01/2014 PCP: Gildardo Cranker, MD  Admit HPI / Brief Narrative: 73 year old with a PMHx PEA arrest. Because of underlying severe COPD she has become chronically dependent on the ventilator. At some point she had a PEG tube placed by interventional radiologist and was discharged to an [facility]. On 06/17/2014 she was admitted from the LTAC due to concerns of stool draining from the PEG tube. After evaluation it was proven she had a trans-colonic gastrostomy tube. She later underwent extensive surgical repair that included creation of right colostomy and placement of a new open gastrostomy tube and was subsequently discharged back to [facility] on 06/30/2014. She also has a history of MRSA and enterococcus bacteremia and prior tracheitis with cultures positive for Alcaligenes bacteria.   She was sent back to [facility] on 07/01/2014 due to concerns of dislodgment of the recently placed gastrostomy tube. Prior to presentation to the emergency department there were several attempts to replace the tube that were unsuccessful. In the emergency department the previous tube was completely removed by the surgeon and a new tube was placed. Placement was checked via CT scan and unfortunately the replaced tube was not in proper position and it was subsequently removed. Since the stomach was sutured to the anterior abdominal wall it was felt that endoscopic placement of feeding tube would be safest approach. Gastroenterology was consulted and on 07/03/2014 patient underwent successful PEG tube placement. In the interim patient's wound subsequently dehisced and based on appearance (bowel fixed to abdominal wall) and clinical exam it was determined that closing the wound in the OR could not be safely accomplished therefore the wound was covered with an Eakin's pouch. Attempts had been made to initiate tube feedings but  patient experienced nausea and vomiting so the gastric tube was placed to gravity. Overall prognosis poor and the critical care physician had an extensive conversation with the patient's family on 07/08/2014. They did agree to a DO NOT RESUSCITATE but otherwise planned to continue aggressive care.  Subsequently trickle tube feedings were resumed with the addition of Reglan on 8/27. Unfortunately she did not tolerate resumption of tube feedings with noted increased leaking of tube feeding around the insertion site. After evaluation by the surgical team it was determined that the tube feedings need to remain off at this point appear Patient was evaluated on the a.m. of 8/28 patient was continuing to express the increased severity of her abdominal pain (patient even yelled over vent about the severity of her bowel pain ) even though tube feeds have been held. Examination revealed the patient's wound had continued dehisce (now the two separate wounds are one wound), with bowel visible just below minimal layer of granulation tissue. Dr. Sherral Hammers met with the patient's family on 07/11/2014 to discuss patient's prognosis and to review goals of care. Patient remains a DO NOT RESUSCITATE but at this time family wishes to consider continued aggressive care  HPI/Subjective: Continues to endorse uncontrolled pain.  Assessment/Plan:  Chronic respiratory failure/Dependent on ventilator/Trach/ severe COPD  -Not appropriate to attempt ventilator weaning at this time  Chronic hypotension  -MAP average greater than 65-cont to taper stress dose steroids -has received albumin 50 gm x1 in addition to fluid challenges and continuous IV fluid  Palliative care meeting -8/29 Dr. Sherral Hammers met with Katelyn Rojas (niece), Katelyn Rojas (son), and Katelyn Rojas (son/on the phone) after going over in detail the past few days of care, and explaining patient's  worsening health despite all efforts. The family has decided they do not want to escalate care.  However they would like to continue current level of treatment. Hopefully this would give Katelyn Rojas (son) time to travel from [place] to be at mothers bedside. Therefore it appears we are waiting for the patient's son to arrive from [place] before we change focus to comfort. 9/2 finally had the opportunity to speak with patient's son Katelyn Rojas in [place] due his employer he is unable to fly out of [place] until Saturday. I discussed that the plan was to have him spend some quality time with his mother before we began to withdraw care reinforcing that once care was withdrawn his mother would die. He verbalized understanding of this. At this point plan is to wait on son to arrive Saturday giving him the opportunity to spend time with his mother who he has not seen physically in quite some time and then eventually proceed with withdrawal of care.  Partially compensated severe metabolic alkalosis -9/81 Was started on Acetazolamide IV 100 mg BID-ABG after second dose of Diamox demonstrated slight decrease in serum pH  PEG tube malfunction/Colon perforation/Open wound of abdomen  -Per general surgery-patient endorsing inadequate pain control so we made an attempt to utilize low-dose oxy IR per tube but since unable to tolerate tube feedings due to leakage and increased residuals this medication was discontinued-attempted IV Dilaudid drip at 0.5 mg per hour but blood pressure dropped to 79 systolic so this was discontinued and instead began concentrated morphine by mouth with 5 mg /0.25 cc continue Reglan-continue TNA noting that the surgical team had a discussion with the patient's niece on 07/13/2014 and frankly explained that TNA is not a long-term option in regards to long-term survival for this patient if she is unable to tolerate further attempts at initiation of enteral feeds via tube. Still with uncontrolled pain so will increase concentrated morphine to 10 mg, dc Fentanyl IV and add  prn IV Dilaudid-Surgery has signed off 9/2  Edema of right upper extremity  -PICC in same arm-venous duplex no DVT -note definitive dependent component-elevated upper extremities as tolerated  Type II DM  -CBGs variably controlled and likely influenced by the fact she remains on stress dose steroids-began low dose long-acting insulin and adjusted sliding scale and now CBGs much better controlled   Anemia, chronic disease  -Hemoglobin stable around 8-8.5   Hypokalemia  -Repleted via TNA and when necessary IV boluses; continues to remain hypokalemic -Follow closely; suspect metabolic alkalosis contributing  Protein-calorie malnutrition, severe/ On TNA  -Did not tolerate trickle tube feedings-continue TNA   Chronic dementia  -Although patient carries diagnosis of dementia appears to understand her precarious situation. She has told multiple providers that she does not wish to live "this way"  Sacral decubitus ulcer, stage III -Continue treatment per wound care per surgery recs   Dehiscence of abdominal incision -two separate wounds are one wound, with bowel visible just below minimal layer of granulation tissue-Continue treatment per surgery  Chronic pain -Secondary to Dehiscence of abdominal incision, and lack of functioning of GI tract.  Code Status:  DNR Family Communication: Spoke with son Katelyn Rojas in [place] who plans to arrive Saturday 9/5 Disposition Plan: SDU with full care until patient's son arrives from [place]; then based on the documentation from the palliative meeting from 8/29 it appears our focus is to change to comfort measures  Consultants: Dr. Georganna Skeans (surgery) Dr. Carol Ada (GI) Dr. Chesley Mires [facility] M.)  Culture 6/18 blood cultures x2 with pan sensitive enterococcus 7/6 respiratory culture: ABUNDANT ALCALIGENES XYLOSOXIDANS and MRSA  8/19 urine culture > 100,000 colonies yeast  8/24 blood cultures x2 NGTD   Antibiotics: Cipro 8/5 >>>    Flagyl 8/5 >>>  Vancomycin 8/5 >>>  Fluconazole 8/25 >>>  DVT prophylaxis: Subcutaneous heparin  Devices Chronic trach   LINES / TUBES:  8/6 double-lumen PICC right brachial 8/17 colostomy RLQ  Objective: VITAL SIGNS: Temp: 97.9 F (36.6 C) (09/02 0813) Temp src: Axillary (09/02 0813) BP: 92/53 mmHg (09/02 0813) Pulse Rate: 86 (09/02 0813)  Intake/Output Summary (Last 24 hours) at 07/15/14 1155 Last data filed at 07/15/14 0817  Gross per 24 hour  Intake   2545 ml  Output   2350 ml  Net    195 ml   Exam: Gen: No acute respiratory distress-alert  Chest: Coarse to auscultation bilaterally L > R, tracheostomy to ventilator  Cardiac: Regular occasionally tachycardic rate and rhythm, S1-S2, no rubs murmurs or gallops, no peripheral edema, no JVD-diffuse edema consistent with anasarca in a dependent fashion Abdomen: Soft and diffusely tender, right colostomy stoma pink; midline abdominal wound dressed and dry at time of exam -PEG to st drain w/ green bilious returns Extremities: without cyanosis, clubbing or effusion-noted diffuse peripheral edema right arm greater than left   Data Reviewed: Basic Metabolic Panel:  Recent Labs Lab 07/09/14 0442  07/10/14 0735 07/11/14 0510 07/11/14 1000 07/12/14 0415 07/13/14 0500 07/14/14 0500 07/15/14 0235 07/15/14 0812  NA 140  < > 143 142  --  140 138 141 144  --   K 3.2*  < > 3.7 3.5*  --  3.8 3.2* 3.5* 5.8* 3.6*  CL 97  < > 98 98  --  97 98 101 104  --   CO2 35*  < > 40* 39*  --  34* 33* 31 33*  --   GLUCOSE 239*  < > 145* 105*  --  80 160* 128* 86  --   BUN 28*  < > 27* 29*  --  31* 33* 31* 34*  --   CREATININE 0.37*  < > 0.40* 0.43*  --  0.43* 0.44* 0.43* 0.39*  --   CALCIUM 8.0*  < > 8.3* 8.3*  --  7.9* 7.7* 8.2* 8.2*  --   MG 1.8  --  2.0 2.1  --   --  1.9 2.1  --   --   PHOS 2.4  --  1.8*  --  2.6  --  3.6  --   --   --   < > = values in this interval not displayed. Liver Function Tests:  Recent Labs Lab  07/09/14 0442 07/10/14 0735 07/13/14 0500  AST 14 15 16   ALT 11 10 9   ALKPHOS 70 73 70  BILITOT 0.3 0.3 0.5  PROT 4.8* 4.5* 4.5*  ALBUMIN 1.8* 1.7* 2.2*   CBC:  Recent Labs Lab 07/10/14 0600 07/12/14 0415 07/13/14 0500  WBC 5.6 7.5 8.4  NEUTROABS  --  6.9 7.3  HGB 7.2* 6.5* 8.9*  HCT 23.5* 20.8* 27.1*  MCV 99.6 98.6 92.2  PLT 255 209 223   Cardiac Enzymes:  Recent Labs Lab 07/10/14 0735  CKTOTAL 23   BNP (last 3 results)  Recent Labs  12/26/13 1130 01/31/14 1540 02/03/14 1535  PROBNP 130.4* 155.9* 129.5*   CBG:  Recent Labs Lab 07/14/14 1639 07/14/14 1956 07/15/14 0020 07/15/14 0442 07/15/14 0811  GLUCAP 138* 108* 96  114* 117*    Recent Results (from the past 240 hour(s))  CULTURE, BLOOD (ROUTINE X 2)     Status: None   Collection Time    07/06/14  3:45 PM      Result Value Ref Range Status   Specimen Description BLOOD RIGHT HAND   Final   Special Requests BOTTLES DRAWN AEROBIC ONLY 8CC   Final   Culture  Setup Time     Final   Value: 07/06/2014 21:10     Performed at [facility]   Culture     Final   Value: NO GROWTH 5 DAYS     Performed at [facility]   Report Status 07/12/2014 FINAL   Final  CULTURE, BLOOD (ROUTINE X 2)     Status: None   Collection Time    07/06/14  4:05 PM      Result Value Ref Range Status   Specimen Description BLOOD LEFT HAND   Final   Special Requests BOTTLES DRAWN AEROBIC AND ANAEROBIC 5CC   Final   Culture  Setup Time     Final   Value: 07/06/2014 21:10     Performed at [facility]   Culture     Final   Value: NO GROWTH 5 DAYS     Performed at [facility]   Report Status 07/12/2014 FINAL   Final     Studies:  Recent x-ray studies have been reviewed in detail by the Attending Physician  Scheduled Meds:  Scheduled Meds: . sodium chloride   Intravenous Once  . acetaZOLAMIDE  100 mg Intravenous Q12H  . antiseptic oral rinse  7 mL Mouth Rinse QID  . chlorhexidine  15  mL Mouth Rinse BID  . ciprofloxacin  400 mg Intravenous Q12H  . fluconazole (DIFLUCAN) IV  100 mg Intravenous Q24H  . heparin  5,000 Units Subcutaneous 3 times per day  . hydrocortisone sodium succinate  25 mg Intravenous Q12H  . insulin aspart  0-15 Units Subcutaneous 6 times per day  . metronidazole  500 mg Intravenous Q8H  . pantoprazole (PROTONIX) IV  40 mg Intravenous QHS  . vancomycin  1,000 mg Intravenous Q24H    Time spent on care of this patient: 25 mins   ELLIS,ALLISON L. , ANP   [facility] Office  [phone] Pager - [phone]  On-Call/Text Page:      [email or web address]      password [email or web address]  If 7PM-7AM, please contact night-coverage [email or web address] Password [email or web address] 07/15/2014, 11:55 AM   LOS: 14 days    I have personally examined this patient and reviewed the entire database. I have reviewed the above note, made any necessary editorial changes, and agree with its content.  Cherene Altes, MD [facility]

## 2014-07-16 DIAGNOSIS — I9589 Other hypotension: Secondary | ICD-10-CM

## 2014-07-16 DIAGNOSIS — E876 Hypokalemia: Secondary | ICD-10-CM

## 2014-07-16 LAB — COMPREHENSIVE METABOLIC PANEL
ALK PHOS: 89 U/L (ref 39–117)
ALT: 13 U/L (ref 0–35)
ANION GAP: 9 (ref 5–15)
AST: 34 U/L (ref 0–37)
Albumin: 2.1 g/dL — ABNORMAL LOW (ref 3.5–5.2)
BILIRUBIN TOTAL: 0.4 mg/dL (ref 0.3–1.2)
BUN: 33 mg/dL — AB (ref 6–23)
CHLORIDE: 102 meq/L (ref 96–112)
CO2: 33 mEq/L — ABNORMAL HIGH (ref 19–32)
Calcium: 7.9 mg/dL — ABNORMAL LOW (ref 8.4–10.5)
Creatinine, Ser: 0.39 mg/dL — ABNORMAL LOW (ref 0.50–1.10)
GFR calc Af Amer: >90 mL/min (ref >90)
GLUCOSE: 147 mg/dL — AB (ref 70–99)
POTASSIUM: 4 meq/L (ref 3.7–5.3)
Sodium: 144 mEq/L (ref 137–147)
Total Protein: 5.1 g/dL — ABNORMAL LOW (ref 6.0–8.3)

## 2014-07-16 LAB — GLUCOSE, CAPILLARY
GLUCOSE-CAPILLARY: 174 mg/dL — AB (ref 70–99)
GLUCOSE-CAPILLARY: 214 mg/dL — AB (ref 70–99)
GLUCOSE-CAPILLARY: 108 mg/dL — AB (ref 70–99)
GLUCOSE-CAPILLARY: 82 mg/dL (ref 70–99)
GLUCOSE-CAPILLARY: 123 mg/dL — AB (ref 70–99)
Glucose-Capillary: 158 mg/dL — ABNORMAL HIGH (ref 70–99)

## 2014-07-16 LAB — PHOSPHORUS: Phosphorus: 3.2 mg/dL (ref 2.3–4.6)

## 2014-07-16 LAB — MAGNESIUM: MAGNESIUM: 1.9 mg/dL (ref 1.5–2.5)

## 2014-07-16 MED ORDER — HYDROMORPHONE HCL PF 1 MG/ML IJ SOLN
1.0000 mg | INTRAMUSCULAR | Status: DC | PRN
  Administered 2014-07-16: 1 mg via INTRAVENOUS
  Filled 2014-07-16: qty 1

## 2014-07-16 MED ORDER — TRACE MINERALS CR-CU-F-FE-I-MN-MO-SE-ZN IV SOLN
INTRAVENOUS | Status: AC
  Administered 2014-07-16: 18:00:00 via INTRAVENOUS
  Filled 2014-07-16: qty 2000

## 2014-07-16 MED ORDER — SODIUM CHLORIDE 0.9 % IV BOLUS (SEPSIS)
250.0000 mL | Freq: Once | INTRAVENOUS | Status: AC
  Administered 2014-07-16: 250 mL via INTRAVENOUS

## 2014-07-16 MED ORDER — DEXTROSE 5 % IV SOLN
3.0000 mg/h | INTRAVENOUS | Status: DC
  Administered 2014-07-16: 0.5 mg/h via INTRAVENOUS
  Administered 2014-07-19 – 2014-07-22 (×3): 2 mg/h via INTRAVENOUS
  Filled 2014-07-16 (×4): qty 10

## 2014-07-16 NOTE — Progress Notes (Signed)
PARENTERAL NUTRITION CONSULT NOTE - FOLLOW UP  Pharmacy Consult: TPN Indication: PEG tube malfunction; Intolerance to TF   Patient Measurements: Height: 5\' 1"  (154.9 cm) Weight: 123 lb 7.3 oz (56 kg) IBW/kg (Calculated) : 47.8  Vital Signs: Temp: 98.9 F (37.2 C) (09/03 0419) Temp src: Oral (09/03 0419) BP: 95/54 mmHg (09/03 0419) Pulse Rate: 95 (09/03 0419) Intake/Output from previous day: 09/02 0701 - 09/03 0700 In: 2075 [IV Piggyback:500; TPN:1575] Out: 2275 [Urine:1950; Drains:325]  Labs: No results found for this basename: WBC, HGB, HCT, PLT, APTT, INR,  in the last 72 hours   Recent Labs  07/14/14 0500 07/15/14 0235 07/15/14 0812 07/16/14 0338  NA 141 144  --  144  K 3.5* 5.8* 3.6* 4.0  CL 101 104  --  102  CO2 31 33*  --  33*  GLUCOSE 128* 86  --  147*  BUN 31* 34*  --  33*  CREATININE 0.43* 0.39*  --  0.39*  CALCIUM 8.2* 8.2*  --  7.9*  MG 2.1  --   --  1.9  PHOS  --   --   --  3.2  PROT  --   --   --  5.1*  ALBUMIN  --   --   --  2.1*  AST  --   --   --  34  ALT  --   --   --  13  ALKPHOS  --   --   --  89  BILITOT  --   --   --  0.4   Estimated Creatinine Clearance: 48.7 ml/min (by C-G formula based on Cr of 0.39).    Recent Labs  07/15/14 1956 07/15/14 2352 07/16/14 0358  GLUCAP 123* 158* 174*     Insulin Requirements in the past 24 hours:  11 units moderate SSI; 13 units regular insulin in TPN  Assessment: 73 year old female transferred from [name] with PEG malfunctioning.  Pharmacy consulted to manage TPN for nutritional support.  GI: 8/5 - 8/18 > admission to [facility]  8/14: OR for segmental transverse colectomy, wedge gastrectomy, open Stamm gastrostomy, and end colostomy  8/19: PEG dislodged, attempted to replace at [name]. Drainage at site, to ED 8/21: PEG replaced in ENDO by Dr Elnoria Howard. Started on TPN per [facility] recs. 8/22: No signs of peritonitis, wait at least 48 hours before starting TF 8/23: Wound dehiscence, concerning that fistulas  may develop.  No TF until 8/24 at the earliest.  8/24: TF (Osmolite 1.2) started at 80ml/hr (goal rate =30 ml/hr). 8/25: Actively vomiting; also with black secretions around trach site. Abd distended. TF on hold. Prealbumin up to 23.3 (wnl). 8/26: TF restarted 8/25  8/27: Although initial residual high, pt tolerated overnight with no residuals this morning 8/28: PEG tube noted to be leaking so TF stopped. 8/29: Family meeting held.  Moving to comfort measures.  Awaiting son's arrival from [place]. 8/31: wound still dehisced with visible bowel just under tissue; TF on hold - prealbumin trended down, slightly below normal range  Cards: hypotensive, HR ok Endo: DM - had several episodes of hypoglycemia 8/23 >> insulin removed from TPN >> CBGs elevated and Lantus added 8/27. Will target CBGs <180 to avoid repeat hypoglcemia, CBGs 121-174 - tapering steroid Lytes: K, mag and Phos wnl , CO2 mildly elevated,  Co Ca 9.42 Renal: SCr stable - acetazolamide, great UOP 1.5 ml/kg/hr Hepatobil: LFT's WNL. TG 32 (TG 332 on 8/22 d/t labs being drawn from TPN  port) ID: Cipro/Diflucan/Flagyl/ Vanc for intra-abd infxn, afebrile Pulm: intubated, FiO2 30% Best Practices: heparin SQ, PPI IV TPN Access: 8/6 double-lumen PICC line TPN day#: 8/10 >> 8/18; resumed 8/21>>  Current Nutrition:  TPN  Nutritional Goals:   1200-1400 kCal, 90-100 grams of protein per day   Plan:  - Continue Clinimix E 5/15 at 75 ml/hr + IVFE at 10 ml/hr on Mon and Fri only.  TPN provides a daily average of 1415 kCal and 90gm of protein daily, meeting 100% of patient's needs. - Continue SSI and increase insulin in TPN to 15 units  - Check labs in AM, watch K+ closely - Awaiting son's arrival to proceed with comfort measures    Talbert Cage, PharmD Pager:  [phone] 07/16/2014, 7:51 AM

## 2014-07-16 NOTE — Progress Notes (Signed)
ANTICOAGULATION CONSULT NOTE - Follow Up Consult  Pharmacy Consult for vancomycin Indication: intra-abdominal infection/sacral decubitus ulcer  Labs:  Recent Labs  07/14/14 0500 07/15/14 0235 07/16/14 0338  CREATININE 0.43* 0.39* 0.39*    Assessment/Plan:  73yo female continues on Vancomycin (Day 29), Cipro (day 30), and Flagyl (day 30). Scr stable, afebrile. Waiting to transition to comfort care  Continue antibiotics at current doses - Do we need to continue??  Thank you. Okey Regal, PharmD [phone]  07/16/2014,8:45 AM

## 2014-07-16 NOTE — Progress Notes (Signed)
[facility] TEAM 1 - Stepdown/ICU TEAM Progress Note  Katelyn Rojas NGE:[id number] DOB: 1941-07-20 DOA: 07/01/2014 PCP: Gildardo Cranker, MD  Admit HPI / Brief Narrative: 73 year old with a PMHx PEA arrest. Because of underlying severe COPD she has become chronically dependent on the ventilator. At some point she had a PEG tube placed by interventional radiologist and was discharged to an [facility]. On 06/17/2014 she was admitted from the LTAC due to concerns of stool draining from the PEG tube. After evaluation it was proven she had a trans-colonic gastrostomy tube. She later underwent extensive surgical repair that included creation of right colostomy and placement of a new open gastrostomy tube and was subsequently discharged back to [facility] on 06/30/2014. She also has a history of MRSA and enterococcus bacteremia and prior tracheitis with cultures positive for Alcaligenes bacteria.   She was sent back to [facility] on 07/01/2014 due to concerns of dislodgment of the recently placed gastrostomy tube. Prior to presentation to the emergency department there were several attempts to replace the tube that were unsuccessful. In the emergency department the previous tube was completely removed by the surgeon and a new tube was placed. Placement was checked via CT scan and unfortunately the replaced tube was not in proper position and it was subsequently removed. Since the stomach was sutured to the anterior abdominal wall it was felt that endoscopic placement of feeding tube would be safest approach. Gastroenterology was consulted and on 07/03/2014 patient underwent successful PEG tube placement. In the interim patient's wound subsequently dehisced and based on appearance (bowel fixed to abdominal wall) and clinical exam it was determined that closing the wound in the OR could not be safely accomplished therefore the wound was covered with an Eakin's pouch. Attempts had been made to initiate tube feedings but  patient experienced nausea and vomiting so the gastric tube was placed to gravity. Overall prognosis poor and the critical care physician had an extensive conversation with the patient's family on 07/08/2014. They did agree to a DO NOT RESUSCITATE but otherwise planned to continue aggressive care.  Subsequently trickle tube feedings were resumed with the addition of Reglan on 8/27. Unfortunately she did not tolerate resumption of tube feedings with noted increased leaking of tube feeding around the insertion site. After evaluation by the surgical team it was determined that the tube feedings need to remain off at this point appear Patient was evaluated on the a.m. of 8/28 patient was continuing to express the increased severity of her abdominal pain (patient even yelled over vent about the severity of her bowel pain ) even though tube feeds have been held. Examination revealed the patient's wound had continued dehisce (now the two separate wounds are one wound), with bowel visible just below minimal layer of granulation tissue. Dr. Sherral Hammers met with the patient's family on 07/11/2014 to discuss patient's prognosis and to review goals of care. Patient remains a DO NOT RESUSCITATE but at this time family wishes to consider continued aggressive care  HPI/Subjective: Today is reporting that pain much better controlled after her medications were adjusted on 07/15/2014  Assessment/Plan:  Chronic respiratory failure/Dependent on ventilator/Trach/ severe COPD  -Not appropriate to attempt ventilator weaning at this time  Chronic hypotension  -MAP average greater than 65-cont to taper stress dose steroids -has received albumin 50 gm x1 in addition to fluid challenges and continuous IV fluid  Palliative care meeting -8/29 Dr. Sherral Hammers met with Katelyn Rojas (niece), Katelyn Rojas (son), and Katelyn Rojas (son/on the phone) after going over in  detail the past few days of care, and explaining patient's worsening health despite all  efforts. The family has decided they do not want to escalate care. However they would like to continue current level of treatment. Hopefully this would give Katelyn Rojas (son) time to travel from [place] to be at mothers bedside. Therefore it appears we are waiting for the patient's son to arrive from [place] before we change focus to comfort. 9/2 finally had the opportunity to speak with patient's son Katelyn Rojas in [place] due his employer he is unable to fly out of [place] until Saturday. I discussed that the plan was to have him spend some quality time with his mother before we began to withdraw care reinforcing that once care was withdrawn his mother would die. He verbalized understanding of this. At this point plan is to wait on son to arrive Saturday giving him the opportunity to spend time with his mother who he has not seen physically in quite some time and then eventually proceed with withdrawal of care.  Partially compensated severe metabolic alkalosis -9/32 Was started on Acetazolamide IV 100 mg BID-ABG after second dose of Diamox demonstrated slight decrease in serum pH  PEG tube malfunction/Colon perforation/Open wound of abdomen  -Per general surgery-patient endorsing inadequate pain control so we made an attempt to utilize low-dose oxy IR per tube but since unable to tolerate tube feedings due to leakage and increased residuals this medication was discontinued-attempted IV Dilaudid drip at 0.5 mg per hour but blood pressure dropped to 79 systolic so this was discontinued and instead began concentrated morphine by mouth with 5 mg /0.25 cc continue Reglan-continue TNA noting that the surgical team had a discussion with the patient's niece on 07/13/2014 and frankly explained that TNA is not a long-term option in regards to long-term survival for this patient if she is unable to tolerate further attempts at initiation of enteral feeds via tube.  Patient continues to be in  excruciating pain requiring greater amounts of narcotic -Surgery has signed off 9/2 -DC all per PEG pain medication. Most likely exacerbating patient's pain, and most likely not being absorbed -Begin morphine drip 0.5 mg/hr; PRIMARY TEAM TO TITRATE UP AS PATIENT REQUIRES FOR PAIN CONTROL -.Increase Dilaudid to1-58m q4hr PRN breakthrough pain   Edema of right upper extremity  -PICC in same arm-venous duplex no DVT -note definitive dependent component-elevated upper extremities as tolerated  Type II DM  -CBGs variably controlled and likely influenced by the fact she remains on stress dose steroids-began low dose long-acting insulin and adjusted sliding scale and now CBGs much better controlled   Anemia, chronic disease  -Hemoglobin stable around 8-8.5   Hypokalemia  -Repleted via TNA and when necessary IV boluses; continues to remain hypokalemic -Follow closely; suspect metabolic alkalosis contributing  Protein-calorie malnutrition, severe/ On TNA  -Did not tolerate trickle tube feedings-continue TNA   Chronic dementia  -Although patient carries diagnosis of dementia appears to understand her precarious situation. She has told multiple providers that she does not wish to live "this way"  Sacral decubitus ulcer, stage III -Continue treatment per wound care per surgery recs   Dehiscence of abdominal incision -two separate wounds are one wound, with bowel visible just below minimal layer of granulation tissue-Continue treatment per surgery  Chronic pain -Secondary to Dehiscence of abdominal incision, and lack of functioning of GI tract.  Code Status:  DNR Family Communication: [place] 9/5 Disposition Plan: SDU with  full care until patient's son arrives from [place]; then based on the documentation from the palliative meeting from 8/29 it appears our focus is to change to comfort measures  Consultants: Dr. Georganna Skeans  (surgery) Dr. Carol Ada (GI) Dr. Chesley Mires [facility] M.)  Culture 6/18 blood cultures x2 with pan sensitive enterococcus 7/6 respiratory culture: ABUNDANT ALCALIGENES XYLOSOXIDANS and MRSA  8/19 urine culture > 100,000 colonies yeast  8/24 blood cultures x2 NGTD   Antibiotics: Cipro 8/5 >>>  Flagyl 8/5 >>>  Vancomycin 8/5 >>>  Fluconazole 8/25 >>>  DVT prophylaxis: Subcutaneous heparin  Devices Chronic trach   LINES / TUBES:  8/6 double-lumen PICC right brachial 8/17 colostomy RLQ  Objective: VITAL SIGNS: Temp: 97.2 F (36.2 C) (09/03 1156) Temp src: Axillary (09/03 1156) BP: 110/55 mmHg (09/03 1156) Pulse Rate: 94 (09/03 1156)  Intake/Output Summary (Last 24 hours) at 07/16/14 1311 Last data filed at 07/16/14 1100  Gross per 24 hour  Intake   2250 ml  Output   1450 ml  Net    800 ml   Exam: Gen: No acute respiratory distress-alert and endorsing increasing pain at the moment after being repositioned but overall improved pain control Chest: Coarse to auscultation bilaterally, tracheostomy to ventilator  Cardiac: Regular occasionally tachycardic rate and rhythm, S1-S2, no rubs murmurs or gallops, no peripheral edema, no JVD-diffuse edema consistent with anasarca in a dependent fashion Abdomen: Soft and diffusely tender, right colostomy stoma pink; midline abdominal wound dressed and dry at time of exam -PEG to st drain w/ green bilious returns Extremities: without cyanosis, clubbing or effusion-noted diffuse peripheral edema right arm greater than left   Data Reviewed: Basic Metabolic Panel:  Recent Labs Lab 07/10/14 0735 07/11/14 0510 07/11/14 1000 07/12/14 0415 07/13/14 0500 07/14/14 0500 07/15/14 0235 07/15/14 0812 07/16/14 0338  NA 143 142  --  140 138 141 144  --  144  K 3.7 3.5*  --  3.8 3.2* 3.5* 5.8* 3.6* 4.0  CL 98 98  --  97 98 101 104  --  102  CO2 40* 39*  --  34* 33* 31 33*  --  33*  GLUCOSE 145* 105*  --  80 160* 128* 86  --  147*  BUN  27* 29*  --  31* 33* 31* 34*  --  33*  CREATININE 0.40* 0.43*  --  0.43* 0.44* 0.43* 0.39*  --  0.39*  CALCIUM 8.3* 8.3*  --  7.9* 7.7* 8.2* 8.2*  --  7.9*  MG 2.0 2.1  --   --  1.9 2.1  --   --  1.9  PHOS 1.8*  --  2.6  --  3.6  --   --   --  3.2   Liver Function Tests:  Recent Labs Lab 07/10/14 0735 07/13/14 0500 07/16/14 0338  AST 15 16 34  ALT _0 ALKPHOS 73 70 89  BILITOT 0.3 0.5 0.4  PROT 4.5* 4.5* 5.1*  ALBUMIN 1.7* 2.2* 2.1*   CBC:  Recent Labs Lab 07/10/14 0600 07/12/14 0415 07/13/14 0500  WBC 5.6 7.5 8.4  NEUTROABS  --  6.9 7.3  HGB 7.2* 6.5* 8.9*  HCT 23.5* 20.8* 27.1*  MCV 99.6 98.6 92.2  PLT 255 209 223   Cardiac Enzymes:  Recent Labs Lab 07/10/14 0735  CKTOTAL 23   BNP (last 3 results)  Recent Labs  12/26/13 1130 01/31/14 1540 02/03/14 1535  PROBNP 130.4* 155.9* 129.5*   CBG:  Recent Labs  Lab 07/15/14 1956 07/15/14 2352 07/16/14 0358 07/16/14 0750 07/16/14 1154  GLUCAP 123* 158* 174* 214* 108*    Recent Results (from the past 240 hour(s))  CULTURE, BLOOD (ROUTINE X 2)     Status: None   Collection Time    07/06/14  3:45 PM      Result Value Ref Range Status   Specimen Description BLOOD RIGHT HAND   Final   Special Requests BOTTLES DRAWN AEROBIC ONLY 8CC   Final   Culture  Setup Time     Final   Value: 07/06/2014 21:10     Performed at [facility]   Culture     Final   Value: NO GROWTH 5 DAYS     Performed at [facility]   Report Status 07/12/2014 FINAL   Final  CULTURE, BLOOD (ROUTINE X 2)     Status: None   Collection Time    07/06/14  4:05 PM      Result Value Ref Range Status   Specimen Description BLOOD LEFT HAND   Final   Special Requests BOTTLES DRAWN AEROBIC AND ANAEROBIC 5CC   Final   Culture  Setup Time     Final   Value: 07/06/2014 21:10     Performed at [facility]   Culture     Final   Value: NO GROWTH 5 DAYS     Performed at [facility]   Report Status 07/12/2014  FINAL   Final     Studies:  Recent x-ray studies have been reviewed in detail by the Attending Physician  Scheduled Meds:  Scheduled Meds: . sodium chloride   Intravenous Once  . acetaZOLAMIDE  100 mg Intravenous Q12H  . antiseptic oral rinse  7 mL Mouth Rinse QID  . chlorhexidine  15 mL Mouth Rinse BID  . ciprofloxacin  400 mg Intravenous Q12H  . fluconazole (DIFLUCAN) IV  100 mg Intravenous Q24H  . heparin  5,000 Units Subcutaneous 3 times per day  . hydrocortisone sodium succinate  25 mg Intravenous Q12H  . insulin aspart  0-15 Units Subcutaneous 6 times per day  . metronidazole  500 mg Intravenous Q8H  . pantoprazole (PROTONIX) IV  40 mg Intravenous QHS  . vancomycin  1,000 mg Intravenous Q24H    Time spent on care of this patient: 25 mins   ELLIS,ALLISON L. , ANP   [facility] Office  [phone] Pager - [phone]  On-Call/Text Page:      [email or web address]      password [email or web address]  If 7PM-7AM, please contact night-coverage [email or web address] Password [email or web address] 07/16/2014, 1:11 PM   LOS: 15 days   Examined patient and discussed assessment and plan with ANP Katelyn Rojas and agree with the above plan. Patient with multiple complex medical problems> 40 minutes spent in direct patient care

## 2014-07-17 LAB — BASIC METABOLIC PANEL
ANION GAP: 7 (ref 5–15)
BUN: 31 mg/dL — ABNORMAL HIGH (ref 6–23)
CALCIUM: 7.6 mg/dL — AB (ref 8.4–10.5)
CO2: 31 mEq/L (ref 19–32)
Chloride: 104 mEq/L (ref 96–112)
Creatinine, Ser: 0.42 mg/dL — ABNORMAL LOW (ref 0.50–1.10)
GLUCOSE: 128 mg/dL — AB (ref 70–99)
Potassium: 2.9 mEq/L — CL (ref 3.7–5.3)
SODIUM: 142 meq/L (ref 137–147)

## 2014-07-17 LAB — GLUCOSE, CAPILLARY
GLUCOSE-CAPILLARY: 103 mg/dL — AB (ref 70–99)
GLUCOSE-CAPILLARY: 129 mg/dL — AB (ref 70–99)
GLUCOSE-CAPILLARY: 141 mg/dL — AB (ref 70–99)
GLUCOSE-CAPILLARY: 199 mg/dL — AB (ref 70–99)
Glucose-Capillary: 123 mg/dL — ABNORMAL HIGH (ref 70–99)
Glucose-Capillary: 173 mg/dL — ABNORMAL HIGH (ref 70–99)

## 2014-07-17 MED ORDER — POTASSIUM CHLORIDE 20 MEQ/15ML (10%) PO LIQD: 40.0000 meq | Freq: Once | ORAL | Status: DC

## 2014-07-17 MED ORDER — POTASSIUM CHLORIDE 10 MEQ/100ML IV SOLN
10.0000 meq | INTRAVENOUS | Status: AC
  Administered 2014-07-17 (×4): 10 meq via INTRAVENOUS
  Filled 2014-07-17 (×3): qty 100

## 2014-07-17 MED ORDER — SODIUM CHLORIDE 0.9 % IV BOLUS (SEPSIS): 250.0000 mL | Freq: Once | INTRAVENOUS | Status: DC

## 2014-07-17 MED ORDER — FAT EMULSION 20 % IV EMUL
250.0000 mL | INTRAVENOUS | Status: AC
  Administered 2014-07-17: 250 mL via INTRAVENOUS
  Filled 2014-07-17: qty 250

## 2014-07-17 MED ORDER — TRACE MINERALS CR-CU-F-FE-I-MN-MO-SE-ZN IV SOLN
INTRAVENOUS | Status: AC
  Administered 2014-07-17: 17:00:00 via INTRAVENOUS
  Filled 2014-07-17: qty 2000

## 2014-07-17 MED ORDER — POTASSIUM CHLORIDE 20 MEQ/15ML (10%) PO LIQD: 40.0000 meq | ORAL | Status: DC

## 2014-07-17 NOTE — Progress Notes (Signed)
PULMONARY / CRITICAL CARE MEDICINE   Name: Katelyn Rojas MRN: [id number] DOB: 09-May-1941    ADMISSION DATE:  07/01/2014  REFERRING MD :  EDP  CHIEF COMPLAINT:  PEG tube malfunction  INITIAL PRESENTATION: 73 year old female from [name]. Chronic vent/trach. D/c from [facility] 8/18 to [name], PEG noted to be dislodged 8/19. It was replaced at [name] but continued to have drainage and bubbling around tube. Brought to ED for evaluation. PCCM called for admission.  STUDIES:  8/19 - XR abdomen: NG tube in stomach, air filled bowel loops. Concern for obstruction.  8/19 CT abdomen/pelvis: >>>  SIGNIFICANT EVENTS: 8/5 - 8/18 > admission to [facility] 8/14 - To OR for segmental transverse colectomy, wedge gastrectomy, open Stamm gastrostomy, and End colostomy 8/19 - PEG dislodged, attempted to replace at [facility]. drainage at site, to ED. 8/21 PEG replaced in ENDO by Dr Elnoria Howard. Started on TNA per Surg recs.  8/23: wound dehisces  8/25: vomited. No bowel sounds. Tubefeeds stopped.  9/2 made full DNR. Family wanting to progress toward comfort   SUBJECTIVE:  Not weaning. Vomited last pm. Pain worse even on morphine gtt   VITAL SIGNS: Temp:  [97.2 F (36.2 C)-98.5 F (36.9 C)] 98.5 F (36.9 C) (09/04 0817) Pulse Rate:  [82-110] 82 (09/04 0900) Resp:  [15-28] 18 (09/04 0817) BP: (80-110)/(40-59) 90/47 mmHg (09/04 0900) SpO2:  [98 %-100 %] 100 % (09/04 0817) FiO2 (%):  [30 %] 30 % (09/04 0900) Weight:  [56.6 kg (124 lb 12.5 oz)] 56.6 kg (124 lb 12.5 oz) (09/04 0500) VENTILATOR SETTINGS: Vent Mode:  [-] PRVC FiO2 (%):  [30 %] 30 % Set Rate:  [18 bmp] 18 bmp Vt Set:  [450 mL-460 mL] 450 mL PEEP:  [5 cmH20] 5 cmH20 Plateau Pressure:  [16 cmH20-22 cmH20] 16 cmH20 INTAKE / OUTPUT:  Intake/Output Summary (Last 24 hours) at 07/17/14 1019 Last data filed at 07/17/14 8295  Gross per 24 hour  Intake   1998 ml  Output   2000 ml  Net     -2 ml    PHYSICAL EXAMINATION: General:  Elderly female, on  vent, c/o "more pain" Neuro:  Non-verbal, awake, alert. HEENT:  [place]/AT, PERRL. Trach in place Cardiovascular:  RRR, no MRG Lungs:  Clear anterior, synchronous with vent, breathing over set rate, occ rhonchi, and wheeze   Abdomen:  distended, dressing intact. PEG in place. Increased pain to palp over entire abd. No bowel sounds PEG leaking   Musculoskeletal:  No acute deformity.  Skin:  Intact, +3 pitting peripheral edema. Diffuse anasarca   LABS:  CBC  Recent Labs Lab 07/12/14 0415 07/13/14 0500  WBC 7.5 8.4  HGB 6.5* 8.9*  HCT 20.8* 27.1*  PLT 209 223   Coag's No results found for this basename: APTT, INR,  in the last 168 hours BMET  Recent Labs Lab 07/15/14 0235 07/15/14 0812 07/16/14 0338 07/17/14 0307  NA 144  --  144 142  K 5.8* 3.6* 4.0 2.9*  CL 104  --  102 104  CO2 33*  --  33* 31  BUN 34*  --  33* 31*  CREATININE 0.39*  --  0.39* 0.42*  GLUCOSE 86  --  147* 128*   Electrolytes  Recent Labs Lab 07/11/14 1000  07/13/14 0500 07/14/14 0500 07/15/14 0235 07/16/14 0338 07/17/14 0307  CALCIUM  --   < > 7.7* 8.2* 8.2* 7.9* 7.6*  MG  --   --  1.9 2.1  --  1.9  --  PHOS 2.6  --  3.6  --   --  3.2  --   < > = values in this interval not displayed. Sepsis Markers  Recent Labs Lab 07/11/14 1000  LATICACIDVEN 1.6   ABG  Recent Labs Lab 07/11/14 0800 07/12/14 0435  PHART 7.534* 7.469*  PCO2ART 44.4 49.0*  PO2ART 93.8 83.7   Liver Enzymes  Recent Labs Lab 07/13/14 0500 07/16/14 0338  AST 16 34  ALT 9 13  ALKPHOS 70 89  BILITOT 0.5 0.4  ALBUMIN 2.2* 2.1*   Glucose  Recent Labs Lab 07/16/14 1154 07/16/14 1607 07/16/14 2032 07/17/14 0001 07/17/14 0402 07/17/14 0820  GLUCAP 108* 82 123* 103* 141* 199*    Imaging No results found.   ASSESSMENT / PLAN: Chronic respiratory failure 2nd to severe COPD.  Tracheostomy status with respirator dependence. Status unchanged Chronic Hypotension - SBP baseline 80's PEG malposition s/p  replacement by GI 8/21 ?SBO Severe protein calorie malnutrition. Wound dehiscence p new PEG, being managed by sgy Ileus 8/25 Anemia of chronic disease. Hx MRSA and enterococcus bacteremia. Hx Tracheitis (cultures + for Alcaligenes bacteria).  Sacral decubitus ulcer. DM type II. Relative AI.  Dementia. Anxiety.  Discussion Pt has been getting worse. Pain difficult to manage, Family now wanting comfort as goal. Awaiting family to arrive from [facility] Full vent support Continue current supportive measures but no escalation Agree w/ focus on comfort and eventually w/d of care   We will be available as needed.    Simonne Martinet, NP

## 2014-07-17 NOTE — Care Management Note (Signed)
    Page 1 of 2   07/17/2014     10:26:06 AM CARE MANAGEMENT NOTE 07/17/2014  Patient:  Katelyn Rojas, Katelyn Rojas   Account Number:  [id number]  Date Initiated:  07/02/2014  Documentation initiated by:  MAYO,HENRIETTA  Subjective/Objective Assessment:   dx dislodged GTube in pt on chronic trach/vent; from [name]     Action/Plan:   Anticipated DC Date:     Anticipated DC Plan:        DC Planning Services  CM consult      Choice offered to / List presented to:             Status of service:  In process, will continue to follow Medicare Important Message given?  YES (If response is "NO", the following Medicare IM given date fields will be blank) Date Medicare IM given:  07/08/2014 Medicare IM given by:  MAYO,HENRIETTA Date Additional Medicare IM given:  07/17/2014 Additional Medicare IM given by:  [facility] Irvin Lizama  Discharge Disposition:    Per UR Regulation:  Reviewed for med. necessity/level of care/duration of stay  If discussed at Long Length of Stay Meetings, dates discussed:   07/07/2014  07/09/2014  07/14/2014    Comments:  Contact: Davis,Charles Son/POA [phone]                Clark,Kathrina Niece [phone]  07/14/14 1500 Henrietta Mayo RN MSN BSN CCM Talked with brother who called pt's son Kela Millin [phone]) in [place].  Son unsure about when he is to fly to [facility], requests TC from NP or MD - notified them of request.  07/13/14 1139 Henrietta Mayo RN MSN BSN CCM Family has chosen comfort care but want to wait until son arrives from [place].  07/10/14 1454 Henrietta Mayo RN MSN BSN CCM Abd wound has increased in size, increase in leakage around G tube.   Family mtg scheduled for tomorrow.  07/08/14 1044 Henrietta Mayo RN MSN BSN CCM GI reinserted GTube but pt started vomiting when tube feeding restarted.  Abd wound has dehisced.  Family mtg scheduled for today. 1408 Family continues to want aggressive treatment. Trickle feeds started.

## 2014-07-17 NOTE — Progress Notes (Signed)
Return telephone call from MD. Notified of patient BP trending down. Review of medical hx, past three nights care, and medications. Orders given. Will continue to monitor. Melany Guernsey

## 2014-07-17 NOTE — Progress Notes (Signed)
Staff MD  Events noted. Goal is moving towards comfort. PCCM will sign off but call if needed esp if guidance needed on terminal wean; can be done by palliative also  Dr. Kalman Shan, M.D., [facility].C.P Pulmonary and Critical Care Medicine Staff Physician [facility] [place] [facility]monary and Critical Care Pager: [phone], If no answer or between  15:00h - 7:00h: call [phone]  07/17/2014 2:45 PM

## 2014-07-17 NOTE — Progress Notes (Signed)
PARENTERAL NUTRITION CONSULT NOTE - FOLLOW UP  Pharmacy Consult: TPN Indication: PEG tube malfunction; Intolerance to TF   Patient Measurements: Height: 5\' 1"  (154.9 cm) Weight: 124 lb 12.5 oz (56.6 kg) IBW/kg (Calculated) : 47.8  Vital Signs: Temp: 98.1 F (36.7 C) (09/04 0403) Temp src: Oral (09/04 0403) BP: 90/51 mmHg (09/04 0403) Pulse Rate: 98 (09/04 0403) Intake/Output from previous day: 09/03 0701 - 09/04 0700 In: 2653 [I.V.:403; IV Piggyback:450; TPN:1800] Out: 2300 [Urine:2200; Drains:100]  Labs: No results found for this basename: WBC, HGB, HCT, PLT, APTT, INR,  in the last 72 hours   Recent Labs  07/15/14 0235 07/15/14 0812 07/16/14 0338 07/17/14 0307  NA 144  --  144 142  K 5.8* 3.6* 4.0 2.9*  CL 104  --  102 104  CO2 33*  --  33* 31  GLUCOSE 86  --  147* 128*  BUN 34*  --  33* 31*  CREATININE 0.39*  --  0.39* 0.42*  CALCIUM 8.2*  --  7.9* 7.6*  MG  --   --  1.9  --   PHOS  --   --  3.2  --   PROT  --   --  5.1*  --   ALBUMIN  --   --  2.1*  --   AST  --   --  34  --   ALT  --   --  13  --   ALKPHOS  --   --  89  --   BILITOT  --   --  0.4  --    Estimated Creatinine Clearance: 48.7 ml/min (by C-G formula based on Cr of 0.42).    Recent Labs  07/16/14 2032 07/17/14 0001 07/17/14 0402  GLUCAP 123* 103* 141*     Insulin Requirements in the past 24 hours:  11 units moderate SSI; 13 units regular insulin in TPN  Assessment: 73 year old female transferred from [name] with PEG malfunctioning.  Pharmacy consulted to manage TPN for nutritional support.  GI: 8/5 - 8/18 > admission to [facility]  8/14: OR for segmental transverse colectomy, wedge gastrectomy, open Stamm gastrostomy, and end colostomy  8/19: PEG dislodged, attempted to replace at [name]. Drainage at site, to ED 8/21: PEG replaced in ENDO by Dr Elnoria Howard. Started on TPN per [facility] recs. 8/22: No signs of peritonitis, wait at least 48 hours before starting TF 8/23: Wound dehiscence, concerning  that fistulas may develop.  No TF until 8/24 at the earliest.  8/24: TF (Osmolite 1.2) started at 67ml/hr (goal rate =30 ml/hr). 8/25: Actively vomiting; also with black secretions around trach site. Abd distended. TF on hold. Prealbumin up to 23.3 (wnl). 8/26: TF restarted 8/25  8/27: Although initial residual high, pt tolerated overnight with no residuals this morning 8/28: PEG tube noted to be leaking so TF stopped. 8/29: Family meeting held.  Moving to comfort measures.  Awaiting son's arrival from [place]. 8/31: wound still dehisced with visible bowel just under tissue; TF on hold - prealbumin trended down, slightly below normal range  Cards: hypotensive, HR elevated Endo: DM - had several episodes of hypoglycemia 8/23 >> insulin removed from TPN >> CBGs elevated and Lantus added 8/27. Will target CBGs <180 to avoid repeat hypoglcemia, CBGs 82-141 - tapering steroid Lytes: K 2.9,  Co Ca 9.12  MD ordered runs X 4 Renal: SCr stable - acetazolamide, great UOP 1.6 ml/kg/hr Hepatobil: LFT's WNL. TG 32 (TG 332 on 8/22 d/t labs being drawn  from TPN port) ID: Cipro/Diflucan/Flagyl/ Vanc for intra-abd infxn, afebrile Pulm: intubated, FiO2 30% Best Practices: heparin SQ, PPI IV TPN Access: 8/6 double-lumen PICC line TPN day#: 8/10 >> 8/18; resumed 8/21>>  Current Nutrition:  TPN  Nutritional Goals:   1200-1400 kCal, 90-100 grams of protein per day   Plan:  - Continue Clinimix E 5/15 at 75 ml/hr + IVFE at 10 ml/hr on Mon and Fri only.  TPN provides a daily average of 1415 kCal and 90gm of protein daily, meeting 100% of patient's needs. - Continue SSI and insulin in TPN at 15 units  - Check labs in AM, watch K+ closely - Awaiting son's arrival to proceed with comfort measures    Talbert Cage, PharmD Pager:  [phone] 07/17/2014, 7:31 AM

## 2014-07-17 NOTE — Progress Notes (Signed)
Sacral dressing and abd dressing changed. Continues to c/o back pain. Katelyn Silk, NP notified and orders given to increase Morphine drip to 1 mg/hr. Pt advised along with the son who is present at the bedside.

## 2014-07-17 NOTE — Progress Notes (Signed)
[place] TEAM 1 - Stepdown/ICU TEAM Progress Note  Chaniah Cisse WRU:[id number] DOB: [birth year] DOA: 07/01/2014 PCP: Gildardo Cranker, MD  Admit HPI / Brief Narrative: 73 year old with a PMHx PEA arrest. Because of underlying severe COPD she has become chronically dependent on the ventilator. At some point she had a PEG tube placed by interventional radiologist and was discharged to an [facility]. On 06/17/2014 she was admitted from the LTAC due to concerns of stool draining from the PEG tube. After evaluation it was proven she had a trans-colonic gastrostomy tube. She later underwent extensive surgical repair that included creation of right colostomy and placement of a new open gastrostomy tube and was subsequently discharged back to [place] on 06/30/2014. She also has a history of MRSA and enterococcus bacteremia and prior tracheitis with cultures positive for Alcaligenes bacteria.   She was sent back to [facility] on 07/01/2014 due to concerns of dislodgment of the recently placed gastrostomy tube. Prior to presentation to the emergency department there were several attempts to replace the tube that were unsuccessful. In the emergency department the previous tube was completely removed by the surgeon and a new tube was placed. Placement was checked via CT scan and unfortunately the replaced tube was not in proper position and it was subsequently removed. Since the stomach was sutured to the anterior abdominal wall it was felt that endoscopic placement of feeding tube would be safest approach. Gastroenterology was consulted and on 07/03/2014 patient underwent successful PEG tube placement. In the interim patient's wound subsequently dehisced and based on appearance (bowel fixed to abdominal wall) and clinical exam it was determined that closing the wound in the OR could not be safely accomplished therefore the wound was covered with an Eakin's pouch. Attempts had been made to initiate tube feedings but  patient experienced nausea and vomiting so the gastric tube was placed to gravity. Overall prognosis poor and the critical care physician had an extensive conversation with the patient's family on 07/08/2014. They did agree to a DO NOT RESUSCITATE but otherwise planned to continue aggressive care.  Subsequently trickle tube feedings were resumed with the addition of Reglan on 8/27. Unfortunately she did not tolerate resumption of tube feedings with noted increased leaking of tube feeding around the insertion site. After evaluation by the surgical team it was determined that the tube feedings need to remain off at this point appear Patient was evaluated on the a.m. of 8/28 patient was continuing to express the increased severity of her abdominal pain (patient even yelled over vent about the severity of her bowel pain ) even though tube feeds have been held. Examination revealed the patient's wound had continued dehisce (now the two separate wounds are one wound), with bowel visible just below minimal layer of granulation tissue. Dr. Sherral Hammers met with the patient's family on 07/11/2014 to discuss patient's prognosis and to review goals of care. Patient remains a DO NOT RESUSCITATE but at this time family wishes to consider continued aggressive care  HPI/Subjective: Sedated and comfortable on IV morphine infusion.  Assessment/Plan:  Chronic respiratory failure/Dependent on ventilator/Trach/ severe COPD  -Not appropriate to attempt ventilator weaning at this time and given recent discussion with the family we'll likely proceed with terminal ventilator weaning sometime after Saturday.  Chronic hypotension  -MAP average greater than 65-cont to taper stress dose steroids -has received albumin 50 gm x1 in addition to fluid challenges and continuous IV fluid  Palliative care meeting -8/29 Dr. Sherral Hammers met with [name] (niece), [name] (son),  and [name] (son/on the phone) after going over in detail the past few days  of care, and explaining patient's worsening health despite all efforts. The family has decided they do not want to escalate care. However they would like to continue current level of treatment. Hopefully this would give [name] (son) time to travel from [place] to be at mothers bedside. Therefore it appears we are waiting for the patient's son to arrive from [place] before we change focus to comfort. 9/2 finally had the opportunity to speak with patient's son Dionicio Stall in [place] due his employer he is unable to fly out of [place] until Saturday. I discussed that the plan was to have him spend some quality time with his mother before we began to withdraw care reinforcing that once care was withdrawn his mother would die. He verbalized understanding of this. At this point plan is to wait on son to arrive Saturday giving him the opportunity to spend time with his mother who he has not seen physically in quite some time and then eventually proceed with withdrawal of care.  Partially compensated severe metabolic alkalosis -9/02 Was started on Acetazolamide IV 100 mg BID-ABG after second dose of Diamox demonstrated slight decrease in serum pH  PEG tube malfunction/Colon perforation/Open wound of abdomen  -Per general surgery-patient endorsing inadequate pain control so we made an attempt to utilize low-dose oxy IR per tube but since unable to tolerate tube feedings due to leakage and increased residuals this medication was discontinued-attempted IV Dilaudid drip at 0.5 mg per hour but blood pressure dropped to 79 systolic so this was discontinued and instead began concentrated morphine by mouth with 5 mg /0.25 cc continue Reglan-continue TNA noting that the surgical team had a discussion with the patient's niece on 07/13/2014 and frankly explained that TNA is not a long-term option in regards to long-term survival for this patient if she is unable to tolerate further attempts at  initiation of enteral feeds via tube. -Because of ongoing severe pain patient was converted to very low dose morphine infusion on 07/16/2014. She did require several doses of IV Dilaudid for breakthrough pain and it was opted on 07/17/2014 to increase infusion to 1 mg per hour.  Edema of right upper extremity  -PICC in same arm-venous duplex no DVT -note definitive dependent component-elevated upper extremities as tolerated  Type II DM  -CBGs variably controlled and likely influenced by the fact she remains on stress dose steroids-began low dose long-acting insulin and adjusted sliding scale and now CBGs much better controlled   Anemia, chronic disease  -Hemoglobin stable around 8-8.5   Hypokalemia  -Repleted via TNA and when necessary IV boluses; continues to remain hypokalemic -Follow closely; suspect metabolic alkalosis contributing  Protein-calorie malnutrition, severe/ On TNA  -Did not tolerate trickle tube feedings-continue TNA   Chronic dementia  -Although patient carries diagnosis of dementia appears to understand her precarious situation. She has told multiple providers that she does not wish to live "this way"  Sacral decubitus ulcer, stage III -Continue treatment per wound care per surgery recs   Dehiscence of abdominal incision -two separate wounds are one wound, with bowel visible just below minimal layer of granulation tissue-Continue treatment per surgery  Chronic pain -Secondary to Dehiscence of abdominal incision, and lack of functioning of GI tract.  Code Status:  DNR Family Communication: 9/2 Spoke with son [name] in [place] who plans to arrive Saturday 9/5 Disposition Plan: SDU with full care until patient's son arrives from [place];  then based on the documentation from the palliative meeting from 8/29 it appears our focus is to change to comfort measures  Consultants: Dr. Georganna Skeans (surgery) Dr. Carol Ada (GI) Dr. Chesley Mires [facility]  M.)  Culture 6/18 blood cultures x2 with pan sensitive enterococcus 7/6 respiratory culture: ABUNDANT ALCALIGENES XYLOSOXIDANS and MRSA  8/19 urine culture > 100,000 colonies yeast  8/24 blood cultures x2 NGTD   Antibiotics: Cipro 8/5 >>>  Flagyl 8/5 >>>  Vancomycin 8/5 >>>  Fluconazole 8/25 >>>  DVT prophylaxis: Subcutaneous heparin  Devices Chronic trach   LINES / TUBES:  8/6 double-lumen PICC right brachial 8/17 colostomy RLQ  Objective: VITAL SIGNS: Temp: 97 F (36.1 C) (09/04 1128) Temp src: Oral (09/04 1128) BP: 112/52 mmHg (09/04 1450) Pulse Rate: 104 (09/04 1450)  Intake/Output Summary (Last 24 hours) at 07/17/14 1456 Last data filed at 07/17/14 1310  Gross per 24 hour  Intake   1638 ml  Output   2500 ml  Net   -862 ml   Exam: Gen: No acute respiratory distress-alert and endorsing increasing pain at the moment after being repositioned but overall improved pain control Chest: Coarse to auscultation bilaterally, tracheostomy to ventilator  Cardiac: Regular occasionally tachycardic rate and rhythm, S1-S2, no rubs murmurs or gallops, no peripheral edema, no JVD-diffuse edema consistent with anasarca in a dependent fashion Abdomen: Soft and diffusely tender, right colostomy stoma pink; midline abdominal wound dressed and dry at time of exam -PEG to st drain w/ green bilious returns Extremities: without cyanosis, clubbing or effusion-noted diffuse peripheral edema right arm greater than left   Data Reviewed: Basic Metabolic Panel:  Recent Labs Lab 07/11/14 0510 07/11/14 1000  07/13/14 0500 07/14/14 0500 07/15/14 0235 07/15/14 0812 07/16/14 0338 07/17/14 0307  NA 142  --   < > 138 141 144  --  144 142  K 3.5*  --   < > 3.2* 3.5* 5.8* 3.6* 4.0 2.9*  CL 98  --   < > 98 101 104  --  102 104  CO2 39*  --   < > 33* 31 33*  --  33* 31  GLUCOSE 105*  --   < > 160* 128* 86  --  147* 128*  BUN 29*  --   < > 33* 31* 34*  --  33* 31*  CREATININE 0.43*  --   < >  0.44* 0.43* 0.39*  --  0.39* 0.42*  CALCIUM 8.3*  --   < > 7.7* 8.2* 8.2*  --  7.9* 7.6*  MG 2.1  --   --  1.9 2.1  --   --  1.9  --   PHOS  --  2.6  --  3.6  --   --   --  3.2  --   < > = values in this interval not displayed. Liver Function Tests:  Recent Labs Lab 07/13/14 0500 07/16/14 0338  AST 16 34  ALT 9 13  ALKPHOS 70 89  BILITOT 0.5 0.4  PROT 4.5* 5.1*  ALBUMIN 2.2* 2.1*   CBC:  Recent Labs Lab 07/12/14 0415 07/13/14 0500  WBC 7.5 8.4  NEUTROABS 6.9 7.3  HGB 6.5* 8.9*  HCT 20.8* 27.1*  MCV 98.6 92.2  PLT 209 223   Cardiac Enzymes: No results found for this basename: CKTOTAL, CKMB, CKMBINDEX, TROPONINI,  in the last 168 hours BNP (last 3 results)  Recent Labs  12/26/13 1130 01/31/14 1540 02/03/14 1535  PROBNP 130.4* 155.9* 129.5*  CBG:  Recent Labs Lab 07/16/14 1607 07/16/14 2032 07/17/14 0001 07/17/14 0402 07/17/14 0820  GLUCAP 82 123* 103* 141* 199*    No results found for this or any previous visit (from the past 240 hour(s)).   Studies:  Recent x-ray studies have been reviewed in detail by the Attending Physician  Scheduled Meds:  Scheduled Meds: . sodium chloride   Intravenous Once  . acetaZOLAMIDE  100 mg Intravenous Q12H  . antiseptic oral rinse  7 mL Mouth Rinse QID  . chlorhexidine  15 mL Mouth Rinse BID  . ciprofloxacin  400 mg Intravenous Q12H  . fluconazole (DIFLUCAN) IV  100 mg Intravenous Q24H  . heparin  5,000 Units Subcutaneous 3 times per day  . hydrocortisone sodium succinate  25 mg Intravenous Q12H  . insulin aspart  0-15 Units Subcutaneous 6 times per day  . metronidazole  500 mg Intravenous Q8H  . pantoprazole (PROTONIX) IV  40 mg Intravenous QHS  . sodium chloride  250 mL Intravenous Once  . vancomycin  1,000 mg Intravenous Q24H    Time spent on care of this patient: 25 mins   ELLIS,ALLISON L. , ANP   [facility] Office  [phone] Pager - [phone]  On-Call/Text Page:      [email or web address]       password [email or web address]  If 7PM-7AM, please contact night-coverage [email or web address] Password [email or web address] 07/17/2014, 2:56 PM   LOS: 16 days   I have personally examined this patient and reviewed the entire database. I have reviewed the above note, made any necessary editorial changes, and agree with its content.  Cherene Altes, MD [facility]

## 2014-07-17 NOTE — Progress Notes (Signed)
eLink Physician-Brief Progress Note Patient Name: Katelyn Rojas DOB: [birth year] MRN: [id number]   Date of Service  07/17/2014  HPI/Events of Note  Hypokalemia  eICU Interventions  Potassium replaced     Intervention Category Intermediate Interventions: Electrolyte abnormality - evaluation and management  Katelyn Rojas 07/17/2014, 4:45 AM

## 2014-07-18 LAB — GLUCOSE, CAPILLARY
GLUCOSE-CAPILLARY: 148 mg/dL — AB (ref 70–99)
GLUCOSE-CAPILLARY: 84 mg/dL (ref 70–99)
Glucose-Capillary: 75 mg/dL (ref 70–99)
Glucose-Capillary: 120 mg/dL — ABNORMAL HIGH (ref 70–99)
Glucose-Capillary: 142 mg/dL — ABNORMAL HIGH (ref 70–99)
Glucose-Capillary: 103 mg/dL — ABNORMAL HIGH (ref 70–99)

## 2014-07-18 LAB — BASIC METABOLIC PANEL
Anion gap: 8 (ref 5–15)
BUN: 28 mg/dL — ABNORMAL HIGH (ref 6–23)
CO2: 31 mEq/L (ref 19–32)
Calcium: 7.9 mg/dL — ABNORMAL LOW (ref 8.4–10.5)
Chloride: 108 mEq/L (ref 96–112)
Creatinine, Ser: 0.42 mg/dL — ABNORMAL LOW (ref 0.50–1.10)
Glucose, Bld: 60 mg/dL — ABNORMAL LOW (ref 70–99)
POTASSIUM: 3 meq/L — AB (ref 3.7–5.3)
SODIUM: 147 meq/L (ref 137–147)

## 2014-07-18 MED ORDER — LORAZEPAM 2 MG/ML IJ SOLN
1.0000 mg | INTRAMUSCULAR | Status: DC | PRN
  Administered 2014-07-18: 2 mg via INTRAVENOUS
  Filled 2014-07-18: qty 1

## 2014-07-18 MED ORDER — POTASSIUM CHLORIDE 10 MEQ/100ML IV SOLN
10.0000 meq | INTRAVENOUS | Status: AC
  Administered 2014-07-18 (×4): 10 meq via INTRAVENOUS
  Filled 2014-07-18 (×3): qty 100

## 2014-07-18 MED ORDER — M.V.I. ADULT IV INJ
INTRAVENOUS | Status: AC
  Administered 2014-07-18: 17:00:00 via INTRAVENOUS
  Filled 2014-07-18: qty 2000

## 2014-07-18 MED ORDER — MORPHINE SULFATE 2 MG/ML IJ SOLN
2.0000 mg | INTRAMUSCULAR | Status: DC | PRN
  Administered 2014-07-18 (×2): 2 mg via INTRAVENOUS
  Administered 2014-07-20 – 2014-07-22 (×23): 4 mg via INTRAVENOUS
  Administered 2014-07-23: 2 mg via INTRAVENOUS
  Administered 2014-07-23: 4 mg via INTRAVENOUS
  Administered 2014-07-23: 2 mg via INTRAVENOUS
  Administered 2014-07-23 (×6): 4 mg via INTRAVENOUS
  Filled 2014-07-18 (×15): qty 2
  Filled 2014-07-18: qty 1
  Filled 2014-07-18 (×5): qty 2
  Filled 2014-07-18: qty 1
  Filled 2014-07-18 (×3): qty 2
  Filled 2014-07-18: qty 1
  Filled 2014-07-18 (×9): qty 2

## 2014-07-18 NOTE — Progress Notes (Signed)
PARENTERAL NUTRITION CONSULT NOTE - FOLLOW UP  Pharmacy Consult: TPN Indication: PEG tube malfunction; Intolerance to TF   Patient Measurements: Height: 5\' 1"  (154.9 cm) Weight: 124 lb 12.5 oz (56.6 kg) IBW/kg (Calculated) : 47.8  Vital Signs: Temp: 97.8 F (36.6 C) (09/05 0353) Temp src: Oral (09/05 0353) BP: 122/61 mmHg (09/05 0353) Pulse Rate: 95 (09/05 0353) Intake/Output from previous day: 09/04 0701 - 09/05 0700 In: 2007.6 [I.V.:12.6; IV Piggyback:650; TPN:1345] Out: 2575 [Urine:2475; Stool:100]  Labs: No results found for this basename: WBC, HGB, HCT, PLT, APTT, INR,  in the last 72 hours   Recent Labs  07/16/14 0338 07/17/14 0307 07/18/14 0254  NA 144 142 147  K 4.0 2.9* 3.0*  CL 102 104 108  CO2 33* 31 31  GLUCOSE 147* 128* 60*  BUN 33* 31* 28*  CREATININE 0.39* 0.42* 0.42*  CALCIUM 7.9* 7.6* 7.9*  MG 1.9  --   --   PHOS 3.2  --   --   PROT 5.1*  --   --   ALBUMIN 2.1*  --   --   AST 34  --   --   ALT 13  --   --   ALKPHOS 89  --   --   BILITOT 0.4  --   --    Estimated Creatinine Clearance: 48.7 ml/min (by C-G formula based on Cr of 0.42).    Recent Labs  07/17/14 1602 07/17/14 1949 07/18/14 0352  GLUCAP 123* 173* 75     Insulin Requirements in the past 24 hours:  12 units moderate SSI; 15 units regular insulin in TPN  Assessment: 73 year old female transferred from [name] with PEG malfunctioning.  Pharmacy consulted to manage TPN for nutritional support.  GI: 8/5 - 8/18 > admission to [facility]  8/14: OR for segmental transverse colectomy, wedge gastrectomy, open Stamm gastrostomy, and end colostomy  8/19: PEG dislodged, attempted to replace at [name]. Drainage at site, to ED 8/21: PEG replaced in ENDO by Dr Elnoria Howard. Started on TPN per [facility] recs. 8/22: No signs of peritonitis, wait at least 48 hours before starting TF 8/23: Wound dehiscence, concerning that fistulas may develop.  No TF until 8/24 at the earliest.  8/24: TF (Osmolite 1.2)  started at 16ml/hr (goal rate =30 ml/hr). 8/25: Actively vomiting; also with black secretions around trach site. Abd distended. TF on hold. Prealbumin up to 23.3 (wnl). 8/26: TF restarted 8/25  8/27: Although initial residual high, pt tolerated overnight with no residuals this morning 8/28: PEG tube noted to be leaking so TF stopped. 8/29: Family meeting held.  Moving to comfort measures.  Awaiting son's arrival from [place]. 8/31: wound still dehisced with visible bowel just under tissue; TF on hold - prealbumin trended down, slightly below normal range  Cards: hypotensive, HR elevated Endo: DM - had several episodes of hypoglycemia 8/23 . Will target CBGs <180 to avoid repeat hypoglcemia, CBGs 75-199 - tapering steroid Lytes: K 3.0,  Co Ca 9.12  KCl runs X 4 ordered Renal: SCr stable -  great UOP 1.8 ml/kg/hr Hepatobil: LFT's WNL. TG 32 (TG 332 on 8/22 d/t labs being drawn from TPN port) ID: Diflucan for intra-abd infxn, afebrile Pulm: intubated, FiO2 30% Best Practices: heparin SQ, PPI IV TPN Access: 8/6 double-lumen PICC line TPN day#: 8/10 >> 8/18; resumed 8/21>>  Current Nutrition:  TPN  Nutritional Goals:   1200-1400 kCal, 90-100 grams of protein per day   Plan:  - Continue Clinimix E 5/15  at 75 ml/hr + IVFE at 10 ml/hr on Mon and Fri only.  TPN provides a daily average of 1415 kCal and 90gm of protein daily, meeting 100% of patient's needs. - Continue SSI and insulin in TPN at 15 units  - Check labs in AM, watch K+ closely - Awaiting son's arrival to proceed with comfort measures  Talbert Cage, PharmD Pager:  [phone] 07/18/2014, 7:00 AM

## 2014-07-18 NOTE — Progress Notes (Signed)
[place] TEAM 1 - Stepdown/ICU TEAM Progress Note  Katelyn Rojas SWF:[id number] DOB: Mar 25, 1941 DOA: 07/01/2014 PCP: Gildardo Cranker, MD  Admit HPI / Brief Narrative: 73 year old with a PMHx PEA arrest. Because of underlying severe COPD she has become chronically dependent on the ventilator. At some point she had a PEG tube placed by interventional radiologist and was discharged to an [name]. On 06/17/2014 she was admitted from the LTAC due to concerns of stool draining from the PEG tube. After evaluation it was proven she had a trans-colonic gastrostomy tube. She later underwent extensive surgical repair that included creation of right colostomy and placement of a new open gastrostomy tube and was subsequently discharged back to [name] on 06/30/2014. She also has a history of MRSA and enterococcus bacteremia and prior tracheitis with cultures positive for Alcaligenes bacteria.   She was sent to [facility] again on 07/01/2014 due to concerns of dislodgment of the recently placed gastrostomy tube. Prior to presentation to the emergency department there were several attempts to replace the tube that were unsuccessful. In the emergency department the previous tube was completely removed by the surgeon and a new tube was placed. Placement was checked via CT scan and unfortunately the replaced tube was not in proper position and it also had to be removed. Since the stomach was sutured to the anterior abdominal wall it was felt that endoscopic placement of feeding tube would be safest approach. Gastroenterology was consulted and on 07/03/2014 patient underwent successful PEG tube placement. In the interim the patient's prior large abdom wound dehisced and it was determined that closing the wound in the OR could not be safely accomplished therefore the wound was covered with an Eakin's pouch. Attempts had been made to initiate tube feedings but patient experienced nausea and vomiting so the gastric tube was  placed to gravity. Overall prognosis poor and the critical care physician had an extensive conversation with the patient's family on 07/08/2014. They did agree to a DO NOT RESUSCITATE but otherwise planned to continue aggressive care.  Subsequently trickle tube feedings were resumed with the addition of Reglan on 8/27. Unfortunately she did not tolerate resumption of tube feedings with noted increased leaking of tube feeding around the insertion site. After evaluation by the surgical team it was determined that the tube feedings need to remain off at this point.  On 8/28 the patient was c/o increased severity of her abdominal pain and exam revealed the patient's wound had dehisced further such that the two separate wounds had become one large wound, with bowel visible just below a minimal layer of granulation tissue. Dr. Sherral Hammers met with the patient's family on 07/11/2014 to discuss patient's prognosis and to review goals of care.   HPI/Subjective: Sedated and comfortable on IV morphine infusion, after increase in dose was required earlier today.    Assessment/Plan:  Chronic respiratory failure/Dependent on ventilator/Trach/ severe COPD  Not appropriate to attempt ventilator weaning at this time  Chronic hypotension  Cont to hydrate and follow   Palliative care meeting 8/29 Dr. Sherral Hammers met with [name] (niece), [name] (son), and [name] (son/on the phone) - the family decided they do not want to escalate care. However they would like to continue current level of treatment. Hopefully this would give [name] (son) time to travel from [place] to be at mothers bedside, at which time care will be transitioned to full comfort only.  Partially compensated severe metabolic alkalosis Appears to be well compensated at this time   PEG tube  malfunction/Colon perforation/Open wound of abdomen  surgical team had a discussion with the patient's niece on 07/13/2014 and frankly explained that TNA is not a  long-term option   Edema of right upper extremity  PICC in same arm-venous duplex no DVT   Type II DM  CBGs much better controlled   Anemia, chronic disease  Hemoglobin stable around 8-8.5   Hypokalemia  Replete via TNA and when necessary IV boluses  Protein-calorie malnutrition, severe Did not tolerate trickle tube feedings - continue TNA   Chronic dementia  Although patient carries diagnosis of dementia appears to understand her precarious situation. She has told multiple providers that she does not wish to live "this way"  Sacral decubitus ulcer, stage III Continue treatment per surgery recs  Chronic pain -Secondary to dehiscence of abdominal incision, and lack of functioning of GI tract.  Code Status:  DNR Family Communication: no family present at time of exam  Disposition Plan: SDU with full care until patient's son arrives from [place]; then based on the documentation from the palliative meeting from 8/29 it appears our focus is to change to comfort measures  Consultants: Dr. Georganna Skeans (surgery) Dr. Carol Ada (GI) Dr. Chesley Mires (PCCM)  Antibiotics Cipro 8/5 >>> 9/04 Flagyl 8/5 >>> 9/04 Vancomycin 8/5 >>> 9/03 Fluconazole 8/25 >>> 9/05  DVT prophylaxis: Subcutaneous heparin  Objective: VITAL SIGNS: Temp: 98.3 F (36.8 C) (09/05 1600) Temp src: Oral (09/05 1600) BP: 77/46 mmHg (09/05 1600) Pulse Rate: 97 (09/05 1600)  Intake/Output Summary (Last 24 hours) at 07/18/14 1834 Last data filed at 07/18/14 1154  Gross per 24 hour  Intake 1242.1 ml  Output   2325 ml  Net -1082.9 ml   Exam: Gen: No acute respiratory distress - sedated comfortably  Exam w/o signif change   Data Reviewed: Basic Metabolic Panel:  Recent Labs Lab 07/13/14 0500 07/14/14 0500 07/15/14 0235 07/15/14 0812 07/16/14 0338 07/17/14 0307 07/18/14 0254  NA 138 141 144  --  144 142 147  K 3.2* 3.5* 5.8* 3.6* 4.0 2.9* 3.0*  CL 98 101 104  --  102 104 108  CO2 33*  31 33*  --  33* 31 31  GLUCOSE 160* 128* 86  --  147* 128* 60*  BUN 33* 31* 34*  --  33* 31* 28*  CREATININE 0.44* 0.43* 0.39*  --  0.39* 0.42* 0.42*  CALCIUM 7.7* 8.2* 8.2*  --  7.9* 7.6* 7.9*  MG 1.9 2.1  --   --  1.9  --   --   PHOS 3.6  --   --   --  3.2  --   --    Liver Function Tests:  Recent Labs Lab 07/13/14 0500 07/16/14 0338  AST 16 34  ALT 9 13  ALKPHOS 70 89  BILITOT 0.5 0.4  PROT 4.5* 5.1*  ALBUMIN 2.2* 2.1*   CBC:  Recent Labs Lab 07/12/14 0415 07/13/14 0500  WBC 7.5 8.4  NEUTROABS 6.9 7.3  HGB 6.5* 8.9*  HCT 20.8* 27.1*  MCV 98.6 92.2  PLT 209 223   CBG:  Recent Labs Lab 07/17/14 2353 07/18/14 0352 07/18/14 0735 07/18/14 1146 07/18/14 1530  GLUCAP 148* 75 120* 142* 84   Studies:  Recent x-ray studies have been reviewed in detail by the Attending Physician  Scheduled Meds:  Scheduled Meds: . sodium chloride   Intravenous Once  . antiseptic oral rinse  7 mL Mouth Rinse QID  . chlorhexidine  15 mL Mouth Rinse BID  .  fluconazole (DIFLUCAN) IV  100 mg Intravenous Q24H  . heparin  5,000 Units Subcutaneous 3 times per day  . hydrocortisone sodium succinate  25 mg Intravenous Q12H  . insulin aspart  0-15 Units Subcutaneous 6 times per day  . pantoprazole (PROTONIX) IV  40 mg Intravenous QHS  . sodium chloride  250 mL Intravenous Once    Time spent on care of this patient: 15 mins  Cherene Altes, MD [facility] For Consults/Admissions - Flow Manager - [phone] Office  [phone] Pager [phone]  On-Call/Text Page:      [email or web address]      password [facility]  07/18/2014, 6:34 PM   LOS: 17 days

## 2014-07-19 LAB — BASIC METABOLIC PANEL
ANION GAP: 6 (ref 5–15)
BUN: 33 mg/dL — ABNORMAL HIGH (ref 6–23)
CALCIUM: 7.9 mg/dL — AB (ref 8.4–10.5)
CHLORIDE: 107 meq/L (ref 96–112)
CO2: 32 mEq/L (ref 19–32)
CREATININE: 0.43 mg/dL — AB (ref 0.50–1.10)
GFR calc Af Amer: >90 mL/min (ref >90)
GFR calc non Af Amer: >90 mL/min (ref >90)
Glucose, Bld: 75 mg/dL (ref 70–99)
Potassium: 4 mEq/L (ref 3.7–5.3)
Sodium: 145 mEq/L (ref 137–147)

## 2014-07-19 LAB — GLUCOSE, CAPILLARY
GLUCOSE-CAPILLARY: 125 mg/dL — AB (ref 70–99)
Glucose-Capillary: 104 mg/dL — ABNORMAL HIGH (ref 70–99)
Glucose-Capillary: 121 mg/dL — ABNORMAL HIGH (ref 70–99)
Glucose-Capillary: 122 mg/dL — ABNORMAL HIGH (ref 70–99)
Glucose-Capillary: 59 mg/dL — ABNORMAL LOW (ref 70–99)
Glucose-Capillary: 63 mg/dL — ABNORMAL LOW (ref 70–99)
Glucose-Capillary: 96 mg/dL (ref 70–99)

## 2014-07-19 MED ORDER — TRACE MINERALS CR-CU-F-FE-I-MN-MO-SE-ZN IV SOLN
INTRAVENOUS | Status: AC
  Administered 2014-07-19: 17:00:00 via INTRAVENOUS
  Filled 2014-07-19: qty 2000

## 2014-07-19 MED ORDER — DEXTROSE 50 % IV SOLN
25.0000 mL | Freq: Once | INTRAVENOUS | Status: AC | PRN
  Administered 2014-07-19: 25 mL via INTRAVENOUS

## 2014-07-19 MED ORDER — DEXTROSE 50 % IV SOLN
INTRAVENOUS | Status: AC
  Filled 2014-07-19: qty 50

## 2014-07-19 MED ORDER — DEXTROSE 10 % IV SOLN
INTRAVENOUS | Status: DC
  Administered 2014-07-19: 1000 mL via INTRAVENOUS

## 2014-07-19 NOTE — Progress Notes (Addendum)
Family requested a visit from the chaplain.  [name] brother Katelyn Rojas and his wife, patient's sister-in-law, Lissa Hoard informed me that patient is within days of dying.  Family wanted a blessing for the patient. 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  I offered prayer and blessing. After some conversation, I offered a healing shawl which the family was grateful to receive. Family is processing their anticipatory grief.  They indicated that the family is very close and this has been hard for everyone, especially because there have been so many complications in the last few months. [name] sons are coming to the hospital in a short time. Brother and sister-in-law would like a chaplain to come and pray with patient on a daily basis even if no visitors are there and even if patient is non-communicative, which is likely.  Referred to floor chaplain and to subsequent on-call chaplains for follow-up care. Please call as needed at other times.  Belia Heman Chaplain 9:19 PM Pager: [phone]

## 2014-07-19 NOTE — Progress Notes (Signed)
[name] MRN:[id number] DOB: 03/23/1941 DOA: 07/01/2014 PCP: [name],[name], MD  Admit HPI / Brief Narrative: 73-year-old with a PMHx PEA arrest. Because of underlying severe COPD she has become chronically dependent on the ventilator. At some point she had a PEG tube placed by interventional radiologist and was discharged to an LTAC. On 06/17/2014 she was admitted from the LTAC due to concerns of stool draining from the PEG tube. After evaluation it was proven she had a trans-colonic gastrostomy tube. She later underwent extensive surgical repair that included creation of right colostomy and placement of a new open gastrostomy tube and was subsequently discharged back to LTAC on 06/30/2014. She also has a history of MRSA and enterococcus bacteremia and prior tracheitis with cultures positive for Alcaligenes bacteria.   She was sent to [facility] again on 07/01/2014 due to concerns of dislodgment of the recently placed gastrostomy tube. Prior to presentation to the emergency department there were several attempts to replace the tube that were unsuccessful. In the emergency department the previous tube was completely removed by the surgeon and a new tube was placed. Placement was checked via CT scan and unfortunately the replaced tube was not in proper position and it also had to be removed. Since the stomach was sutured to the anterior abdominal wall it was felt that endoscopic placement of feeding tube would be safest approach. Gastroenterology was consulted and on 07/03/2014 patient underwent successful PEG tube placement. In the interim the patient's prior large abdom wound dehisced and it was determined that closing the wound in the OR could not be safely accomplished therefore the wound was covered with an Eakin's pouch. Attempts had been made to initiate tube feedings but patient experienced nausea and vomiting so the gastric tube was  placed to gravity. Overall prognosis poor and the critical care physician had an extensive conversation with the patient's family on 07/08/2014. They did agree to a DO NOT RESUSCITATE but otherwise planned to continue aggressive care.  Subsequently trickle tube feedings were resumed with the addition of Reglan on 8/27. Unfortunately she did not tolerate resumption of tube feedings with noted increased leaking of tube feeding around the insertion site. After evaluation by the surgical team it was determined that the tube feedings need to remain off at this point.  On 8/28 the patient was c/o increased severity of her abdominal pain and exam revealed the patient's wound had dehisced further such that the two separate wounds had become one large wound, with bowel visible just below a minimal layer of granulation tissue. Dr. Woods met with the patient's family on 07/11/2014 to discuss patient's prognosis and to review goals of care.   HPI/Subjective: Sedated and comfortable on IV morphine infusion.    Assessment/Plan:  Chronic respiratory failure/Dependent on ventilator/Trach/ severe COPD  Not appropriate to attempt ventilator weaning at this time  Chronic hypotension  BP stable at her baseline   Palliative care meeting 8/29 Dr. Woods met with [name] (niece), Charles (son), and Ronnie (son/on the phone) - the family decided they do not want to escalate care, however they would like to continue current level of treatment - hopefully this would give Ronnie (son) time to travel from [place] to be at mothers bedside, at which time care will be transitioned to full comfort only  Severe metabolic alkalosis Appears to be well compensated at this time / resolved  PEG tube malfunction/Colon perforation/Open wound of abdomen  surgical team   had a discussion with the patient's niece on 07/13/2014 and frankly explained that TNA is not a long-term option - there are also no options for enteral feeding  unfortunately   Edema of right upper extremity  PICC in same arm - venous duplex no DVT   Type II DM  CBGs well controlled   Anemia, chronic disease  Hemoglobin has been stable around 8-8.5   Hypokalemia  Replete via TNA and when necessary IV boluses  Protein-calorie malnutrition, severe Did not tolerate trickle tube feedings - continue TNA   Chronic dementia  she has told multiple providers that she does not wish to live "this way"  Sacral decubitus ulcer, stage III Continue treatment per surgery recs  Chronic pain Secondary to dehiscence of abdominal incision, and lack of functioning of GI tract  Code Status:  DNR Family Communication: no family present at time of exam  Disposition Plan: SDU with full care until patient's son arrives from [place]; then based on the documentation from the palliative meeting from 8/29 it appears our focus is to change to comfort measures  Consultants: Dr. Georganna Skeans (surgery) Dr. Carol Ada (GI) Dr. Chesley Mires (PCCM)  Antibiotics Cipro 8/5 >>> 9/04 Flagyl 8/5 >>> 9/04 Vancomycin 8/5 >>> 9/03 Fluconazole 8/25 >>> 9/05  DVT prophylaxis: Subcutaneous heparin  Objective: Blood pressure 79/43, pulse 86, temperature 98.9 F (37.2 C), temperature source Axillary, resp. rate 18, height 5' 1" (1.549 m), weight 55.4 kg (122 lb 2.2 oz), SpO2 97.00%.  Intake/Output Summary (Last 24 hours) at 07/19/14 1103 Last data filed at 07/19/14 0800  Gross per 24 hour  Intake 1888.4 ml  Output    875 ml  Net 1013.4 ml   Exam: Gen: No acute respiratory distress - sedated comfortably  Resp:  Course crackles th/o  Adbom:  Large open wound dressed  Ext: 2+ diffuse edema c/w anasarca   Data Reviewed: Basic Metabolic Panel:  Recent Labs Lab 07/13/14 0500 07/14/14 0500 07/15/14 0235 07/15/14 0812 07/16/14 0338 07/17/14 0307 07/18/14 0254 07/19/14 0810  NA 138 141 144  --  144 142 147 145  K 3.2* 3.5* 5.8* 3.6* 4.0 2.9* 3.0* 4.0    CL 98 101 104  --  102 104 108 107  CO2 33* 31 33*  --  33* 31 31 32  GLUCOSE 160* 128* 86  --  147* 128* 60* 75  BUN 33* 31* 34*  --  33* 31* 28* 33*  CREATININE 0.44* 0.43* 0.39*  --  0.39* 0.42* 0.42* 0.43*  CALCIUM 7.7* 8.2* 8.2*  --  7.9* 7.6* 7.9* 7.9*  MG 1.9 2.1  --   --  1.9  --   --   --   PHOS 3.6  --   --   --  3.2  --   --   --    Liver Function Tests:  Recent Labs Lab 07/13/14 0500 07/16/14 0338  AST 16 34  ALT 9 13  ALKPHOS 70 89  BILITOT 0.5 0.4  PROT 4.5* 5.1*  ALBUMIN 2.2* 2.1*   CBC:  Recent Labs Lab 07/13/14 0500  WBC 8.4  NEUTROABS 7.3  HGB 8.9*  HCT 27.1*  MCV 92.2  PLT 223   CBG:  Recent Labs Lab 07/18/14 1530 07/18/14 2009 07/19/14 0018 07/19/14 0325 07/19/14 0742  GLUCAP 84 103* 121* 125* 104*   Studies:  Recent x-ray studies have been reviewed in detail by the Attending Physician  Scheduled Meds:  Scheduled Meds: . antiseptic oral  rinse  7 mL Mouth Rinse QID  . chlorhexidine  15 mL Mouth Rinse BID  . heparin  5,000 Units Subcutaneous 3 times per day  . insulin aspart  0-15 Units Subcutaneous 6 times per day  . pantoprazole (PROTONIX) IV  40 mg Intravenous QHS    Time spent on care of this patient: 25 mins  Cherene Altes, MD [facility] For Consults/Admissions - Flow Manager - [phone] Office  [phone] Pager [phone]  On-Call/Text Page:      [email or web address]      password [facility]  07/19/2014, 11:03 AM   LOS: 18 days

## 2014-07-19 NOTE — Progress Notes (Signed)
Notified Katelyn Rojas that Katelyn Rojas's CBG are very labile. Present CBG is 63 Previous was 96 ,59 was treated which brought it to the 96. Pt Has 15U Regular Insulin in her TPN.

## 2014-07-19 NOTE — Progress Notes (Signed)
PARENTERAL NUTRITION CONSULT NOTE - FOLLOW UP  Pharmacy Consult: TPN Indication: PEG tube malfunction; Intolerance to TF   Patient Measurements: Height: 5\' 1"  (154.9 cm) Weight: 122 lb 2.2 oz (55.4 kg) IBW/kg (Calculated) : 47.8  Vital Signs: Temp: 98.9 F (37.2 C) (09/06 0400) Temp src: Oral (09/06 0400) BP: 63/31 mmHg (09/06 0400) Pulse Rate: 98 (09/06 0400) Intake/Output from previous day: 09/05 0701 - 09/06 0700 In: 1540.4 [I.V.:610.4; TPN:780] Out: 1325 [Urine:1250; Stool:75]  Labs: No results found for this basename: WBC, HGB, HCT, PLT, APTT, INR,  in the last 72 hours   Recent Labs  07/17/14 0307 07/18/14 0254  NA 142 147  K 2.9* 3.0*  CL 104 108  CO2 31 31  GLUCOSE 128* 60*  BUN 31* 28*  CREATININE 0.42* 0.42*  CALCIUM 7.6* 7.9*   Estimated Creatinine Clearance: 48.7 ml/min (by C-G formula based on Cr of 0.42).    Recent Labs  07/18/14 2009 07/19/14 0018 07/19/14 0325  GLUCAP 103* 121* 125*     Insulin Requirements in the past 24 hours:  6 units moderate SSI; 15 units regular insulin in TPN  Assessment: 73 year old female transferred from [name] with PEG malfunctioning.  Pharmacy consulted to manage TPN for nutritional support.  GI: 8/5 - 8/18 > admission to [facility]  8/14: OR for segmental transverse colectomy, wedge gastrectomy, open Stamm gastrostomy, and end colostomy  8/19: PEG dislodged, attempted to replace at [name]. Drainage at site, to ED 8/21: PEG replaced in ENDO by Dr Elnoria Howard. Started on TPN per [facility]CS recs. 8/22: No signs of peritonitis, wait at least 48 hours before starting TF 8/23: Wound dehiscence, concerning that fistulas may develop.  No TF until 8/24 at the earliest.  8/24: TF (Osmolite 1.2) started at 5ml/hr (goal rate =30 ml/hr). 8/25: Actively vomiting; also with black secretions around trach site. Abd distended. TF on hold. Prealbumin up to 23.3 (wnl). 8/26: TF restarted 8/25  8/27: Although initial residual high, pt tolerated  overnight with no residuals this morning 8/28: PEG tube noted to be leaking so TF stopped. 8/29: Family meeting held.  Moving to comfort measures.  Awaiting son's arrival from [place]. 8/31: wound still dehisced with visible bowel just under tissue; TF on hold - prealbumin trended down, slightly below normal range  Cards: hypotensive, HR elevated Endo: DM - had several episodes of hypoglycemia 8/23 . Will target CBGs <180 to avoid repeat hypoglcemia, CBGs 84-142 - tapering steroid Lytes: stable Renal: SCr stable -  UOP 0.9 ml/kg/hr Hepatobil: LFT's WNL. TG 32 (TG 332 on 8/22 d/t labs being drawn from TPN port) ID: Antibiotics completed afebrile Pulm: intubated, FiO2 30% Best Practices: heparin SQ, PPI IV TPN Access: 8/6 double-lumen PICC line TPN day#: 8/10 >> 8/18; resumed 8/21>>  Current Nutrition:  TPN  Nutritional Goals:   1200-1400 kCal, 90-100 grams of protein per day   Plan:  - Continue Clinimix E 5/15 at 75 ml/hr + IVFE at 10 ml/hr on Mon and Fri only.  TPN provides a daily average of 1415 kCal and 90gm of protein daily, meeting 100% of patient's needs. - Continue SSI and insulin in TPN at 15 units  - Check labs in AM, watch K+ closely - Awaiting son's arrival to proceed with comfort measures  Talbert Cage, PharmD Pager:  [phone] 07/19/2014, 7:13 AM

## 2014-07-20 LAB — COMPREHENSIVE METABOLIC PANEL
ALT: 9 U/L (ref 0–35)
AST: 19 U/L (ref 0–37)
Albumin: 1.7 g/dL — ABNORMAL LOW (ref 3.5–5.2)
Alkaline Phosphatase: 99 U/L (ref 39–117)
Anion gap: 6 (ref 5–15)
BUN: 34 mg/dL — ABNORMAL HIGH (ref 6–23)
CO2: 32 meq/L (ref 19–32)
CREATININE: 0.4 mg/dL — AB (ref 0.50–1.10)
Calcium: 7.8 mg/dL — ABNORMAL LOW (ref 8.4–10.5)
Chloride: 105 mEq/L (ref 96–112)
GLUCOSE: 120 mg/dL — AB (ref 70–99)
Potassium: 4 mEq/L (ref 3.7–5.3)
Sodium: 143 mEq/L (ref 137–147)
Total Bilirubin: 0.9 mg/dL (ref 0.3–1.2)
Total Protein: 4.4 g/dL — ABNORMAL LOW (ref 6.0–8.3)

## 2014-07-20 LAB — DIFFERENTIAL
BASOS PCT: 0 % (ref 0–1)
Basophils Absolute: 0 10*3/uL (ref 0.0–0.1)
EOS ABS: 0.1 10*3/uL (ref 0.0–0.7)
Eosinophils Relative: 1 % (ref 0–5)
Lymphocytes Relative: 5 % — ABNORMAL LOW (ref 12–46)
Lymphs Abs: 0.4 10*3/uL — ABNORMAL LOW (ref 0.7–4.0)
Monocytes Absolute: 0.3 10*3/uL (ref 0.1–1.0)
Monocytes Relative: 3 % (ref 3–12)
NEUTROS ABS: 7.4 10*3/uL (ref 1.7–7.7)
NEUTROS PCT: 91 % — AB (ref 43–77)

## 2014-07-20 LAB — CBC
HCT: 26.6 % — ABNORMAL LOW (ref 36.0–46.0)
Hemoglobin: 8.2 g/dL — ABNORMAL LOW (ref 12.0–15.0)
MCH: 30.3 pg (ref 26.0–34.0)
MCHC: 30.8 g/dL (ref 30.0–36.0)
MCV: 98.2 fL (ref 78.0–100.0)
Platelets: 229 10*3/uL (ref 150–400)
RBC: 2.71 MIL/uL — ABNORMAL LOW (ref 3.87–5.11)
RDW: 15.6 % — ABNORMAL HIGH (ref 11.5–15.5)
WBC: 8.1 10*3/uL (ref 4.0–10.5)

## 2014-07-20 LAB — PREALBUMIN: Prealbumin: 17.1 mg/dL — ABNORMAL LOW (ref 17.0–34.0)

## 2014-07-20 LAB — GLUCOSE, CAPILLARY
GLUCOSE-CAPILLARY: 118 mg/dL — AB (ref 70–99)
GLUCOSE-CAPILLARY: 83 mg/dL (ref 70–99)
GLUCOSE-CAPILLARY: 130 mg/dL — AB (ref 70–99)
Glucose-Capillary: 111 mg/dL — ABNORMAL HIGH (ref 70–99)
Glucose-Capillary: 151 mg/dL — ABNORMAL HIGH (ref 70–99)
Glucose-Capillary: 77 mg/dL (ref 70–99)

## 2014-07-20 LAB — MAGNESIUM: MAGNESIUM: 1.8 mg/dL (ref 1.5–2.5)

## 2014-07-20 LAB — TRIGLYCERIDES: TRIGLYCERIDES: 47 mg/dL (ref <150)

## 2014-07-20 LAB — PHOSPHORUS: PHOSPHORUS: 3.3 mg/dL (ref 2.3–4.6)

## 2014-07-20 MED ORDER — TRACE MINERALS CR-CU-F-FE-I-MN-MO-SE-ZN IV SOLN
INTRAVENOUS | Status: AC
  Administered 2014-07-20: 17:00:00 via INTRAVENOUS
  Filled 2014-07-20: qty 2000

## 2014-07-20 MED ORDER — FAT EMULSION 20 % IV EMUL
250.0000 mL | INTRAVENOUS | Status: AC
  Administered 2014-07-20: 250 mL via INTRAVENOUS
  Filled 2014-07-20: qty 250

## 2014-07-20 NOTE — Progress Notes (Signed)
TEAM 1 - Stepdown/ICU TEAM Progress Note  Katelyn Rojas YKZ:[id number] DOB: 07-28-1941 DOA: 07/01/2014 PCP: Gildardo Cranker, MD  Admit HPI / Brief Narrative: 73 year old with a PMHx PEA arrest. Because of underlying severe COPD she has become chronically dependent on the ventilator. At some point she had a PEG tube placed by interventional radiologist and was discharged to an [facility]. On 06/17/2014 she was admitted from the LTAC due to concerns of stool draining from the PEG tube. After evaluation it was proven she had a trans-colonic gastrostomy tube. She later underwent extensive surgical repair that included creation of right colostomy and placement of a new open gastrostomy tube and was subsequently discharged back to [facility] on 06/30/2014. She also has a history of MRSA and enterococcus bacteremia and prior tracheitis with cultures positive for Alcaligenes bacteria.   She was sent to [facility] again on 07/01/2014 due to concerns of dislodgment of the recently placed gastrostomy tube. Prior to presentation to the emergency department there were several attempts to replace the tube that were unsuccessful. In the emergency department the previous tube was completely removed by the surgeon and a new tube was placed. Placement was checked via CT scan and unfortunately the replaced tube was not in proper position and it also had to be removed. Since the stomach was sutured to the anterior abdominal wall it was felt that endoscopic placement of feeding tube would be safest approach. Gastroenterology was consulted and on 07/03/2014 patient underwent successful PEG tube placement. In the interim the patient's prior large abdom wound dehisced and it was determined that closing the wound in the OR could not be safely accomplished therefore the wound was covered with an Eakin's pouch. Attempts had been made to initiate tube feedings but patient experienced nausea and vomiting so the gastric tube was  placed to gravity. Overall prognosis poor and the critical care physician had an extensive conversation with the patient's family on 07/08/2014. They did agree to a DO NOT RESUSCITATE but otherwise planned to continue aggressive care.  Subsequently trickle tube feedings were resumed with the addition of Reglan on 8/27. Unfortunately she did not tolerate resumption of tube feedings with noted increased leaking of tube feeding around the insertion site. After evaluation by the surgical team it was determined that the tube feedings need to remain off at this point.  On 8/28 the patient was c/o increased severity of her abdominal pain and exam revealed the patient's wound had dehisced further such that the two separate wounds had become one large wound, with bowel visible just below a minimal layer of granulation tissue. Dr. Sherral Hammers met with the patient's family on 07/11/2014 to discuss patient's prognosis and to review goals of care.   HPI/Subjective: Sedated and comfortable on IV morphine infusion.  No family present at time of my visit.    Assessment/Plan:  Chronic respiratory failure/Dependent on ventilator/Trach/ severe COPD  Once family is ready, a terminal vent wean would be appropriate   Chronic hypotension  BP stable at her baseline   Palliative care meeting 8/29 Dr. Sherral Hammers met with [name] (niece), [name] (son), and [name] (son/on the phone) - the family decided they do not want to escalate care, however they would like to continue current level of treatment - hopefully this would give [name] (son) time to travel from [place] to be at mothers bedside, at which time care will be transitioned to full comfort only  PEG tube malfunction / Colon perforation / Open wound of abdomen  surgical team had a discussion with the patient's niece on 07/13/2014 and frankly explained that TNA is not a long-term option - there are also no options for enteral feeding unfortunately - her current abdom  wound/situation is a terminal condition   Edema of right upper extremity  PICC in same arm - venous duplex no DVT   Type II DM  Experienced some hypoglycemia over the last 24hrs - insulin removed from TNA   Anemia, chronic disease  Hemoglobin has been stable around 8-8.5   Hypokalemia  Replete via TNA and when necessary IV boluses  Protein-calorie malnutrition, severe Did not tolerate trickle tube feedings - continue TNA short term, but long term use would be inhumane   Chronic dementia  she has told multiple providers that she does not wish to live "this way"  Sacral decubitus ulcer, stage III Continue treatment per surgery recs  Chronic pain Secondary to dehiscence of abdominal incision, and lack of functioning of GI tract  Code Status:  DNR Family Communication: no family present at time of exam  Disposition Plan: SDU - awaiting arrival of family from out of town, at which time plan will be to discuss/begin terminal wen   Consultants: Dr. Georganna Skeans (surgery) Dr. Carol Ada (GI) Dr. Chesley Mires (PCCM)  Antibiotics Cipro 8/5 >>> 9/04 Flagyl 8/5 >>> 9/04 Vancomycin 8/5 >>> 9/03 Fluconazole 8/25 >>> 9/05  DVT prophylaxis: Subcutaneous heparin  Objective: Blood pressure 133/62, pulse 118, temperature 98.7 F (37.1 C), temperature source Oral, resp. rate 18, height 5' 1"  (1.549 m), weight 54.9 kg (121 lb 0.5 oz), SpO2 100.00%.  Intake/Output Summary (Last 24 hours) at 07/20/14 1056 Last data filed at 07/20/14 0745  Gross per 24 hour  Intake   1906 ml  Output   2050 ml  Net   -144 ml   Exam: Gen: No acute respiratory distress - sedated comfortably  Resp:  Course crackles th/o all fields  Adbom:  Large open wound dressed  Ext: 2+ diffuse edema/anasarca   Data Reviewed: Basic Metabolic Panel:  Recent Labs Lab 07/14/14 0500  07/16/14 0338 07/17/14 0307 07/18/14 0254 07/19/14 0810 07/20/14 0450  NA 141  < > 144 142 147 145 143  K 3.5*  < > 4.0  2.9* 3.0* 4.0 4.0  CL 101  < > 102 104 108 107 105  CO2 31  < > 33* 31 31 32 32  GLUCOSE 128*  < > 147* 128* 60* 75 120*  BUN 31*  < > 33* 31* 28* 33* 34*  CREATININE 0.43*  < > 0.39* 0.42* 0.42* 0.43* 0.40*  CALCIUM 8.2*  < > 7.9* 7.6* 7.9* 7.9* 7.8*  MG 2.1  --  1.9  --   --   --  1.8  PHOS  --   --  3.2  --   --   --  3.3  < > = values in this interval not displayed.  Liver Function Tests:  Recent Labs Lab 07/16/14 0338 07/20/14 0450  AST 34 19  ALT 13 9  ALKPHOS 89 99  BILITOT 0.4 0.9  PROT 5.1* 4.4*  ALBUMIN 2.1* 1.7*   CBC:  Recent Labs Lab 07/20/14 0450  WBC 8.1  NEUTROABS 7.4  HGB 8.2*  HCT 26.6*  MCV 98.2  PLT 229   CBG:  Recent Labs Lab 07/19/14 1731 07/19/14 2051 07/20/14 0009 07/20/14 0548 07/20/14 0745  GLUCAP 96 63* 111* 118* 151*   Studies:  Recent x-ray studies have been reviewed  in detail by the Attending Physician  Scheduled Meds:  Scheduled Meds: . antiseptic oral rinse  7 mL Mouth Rinse QID  . chlorhexidine  15 mL Mouth Rinse BID  . heparin  5,000 Units Subcutaneous 3 times per day  . insulin aspart  0-15 Units Subcutaneous 6 times per day    Time spent on care of this patient: 25 mins  Cherene Altes, MD [facility] For Consults/Admissions - Flow Manager - [phone] Office  [phone] Pager [phone]  On-Call/Text Page:      [email or web address]      password [facility]  07/20/2014, 10:56 AM   LOS: 19 days

## 2014-07-20 NOTE — Progress Notes (Signed)
PARENTERAL NUTRITION CONSULT NOTE - FOLLOW UP  Pharmacy Consult: TPN Indication: PEG tube malfunction; Intolerance to TF   Patient Measurements: Height:  (154.9 cm) Weight: 121 lb 0.5 oz (54.9 kg) IBW/kg (Calculated) : 47.8  Vital Signs: Temp: 97.9 F (36.6 C) (09/07 0400) Temp src: Axillary (09/07 0400) BP: 87/41 mmHg (09/07 0400) Pulse Rate: 100 (09/07 0500) Intake/Output from previous day: 09/06 0701 - 09/07 0700 In: 1739 [I.V.:314; TPN:1425] Out: 1225 [Urine:1225]  Labs:  Recent Labs  07/20/14 0450  WBC 8.1  HGB 8.2*  HCT 26.6*  PLT 229     Recent Labs  07/18/14 0254 07/19/14 0810 07/20/14 0450  NA 147 145 143  K 3.0* 4.0 4.0  CL 108 107 105  CO2 31 32 32  GLUCOSE 60* 75 120*  BUN 28* 33* 34*  CREATININE 0.42* 0.43* 0.40*  CALCIUM 7.9* 7.9* 7.8*  MG  --   --  1.8  PHOS  --   --  3.3  PROT  --   --  4.4*  ALBUMIN  --   --  1.7*  AST  --   --  19  ALT  --   --  9  ALKPHOS  --   --  99  BILITOT  --   --  0.9  TRIG  --   --  47   Estimated Creatinine Clearance: 48.7 ml/min (by C-G formula based on Cr of 0.4).    Recent Labs  07/19/14 1731 07/19/14 2051 07/20/14 0009  GLUCAP 96 63* 111*     Insulin Requirements in the past 24 hours:  2 units moderate SSI; 15 units regular insulin in TPN  Assessment: 73 year old female transferred from [name] with PEG malfunctioning.  Pharmacy consulted to manage TPN for nutritional support.  GI: 8/5 - 8/18 > admission to [facility]  8/14: OR for segmental transverse colectomy, wedge gastrectomy, open Stamm gastrostomy, and end colostomy  8/19: PEG dislodged, attempted to replace at [name]. Drainage at site, to ED 8/21: PEG replaced in ENDO by Dr Elnoria Howard. Started on TPN per CCS recs. 8/22: No signs of peritonitis, wait at least 48 hours before starting TF 8/23: Wound dehiscence, concerning that fistulas may develop.  No TF until 8/24 at the earliest.  8/24: TF (Osmolite 1.2) started at 76ml/hr (goal rate =30  ml/hr). 8/25: Actively vomiting; also with black secretions around trach site. Abd distended. TF on hold. Prealbumin up to 23.3 (wnl). 8/26: TF restarted 8/25  8/27: Although initial residual high, pt tolerated overnight with no residuals this morning 8/28: PEG tube noted to be leaking so TF stopped. 8/29: Family meeting held.  Moving to comfort measures.  Awaiting son's arrival from [place]. 8/31: wound still dehisced with visible bowel just under tissue; TF on hold - prealbumin trended down, slightly below normal range  Cards: hypotensive, HR elevated Endo: DM - cbgs labile with hypoglycemia yesterday . Will target CBGs <180 to avoid repeat hypoglcemia, CBGs 59-122   No longer on steroids Lytes: K 4.0, CoCa 9.64, Mag 1.8, Phos 3.3 Renal: SCr stable -  UOP 0.9 ml/kg/hr Hepatobil: LFT's WNL. TG 47 (TG 332 on 8/22 d/t labs being drawn from TPN port) ID: Antibiotics completed afebrile Pulm: intubated, FiO2 30% Best Practices: heparin SQ, PPI IV TPN Access: 8/6 double-lumen PICC line TPN day#: 8/10 >> 8/18; resumed 8/21>>  Current Nutrition:  TPN  Nutritional Goals:   1200-1400 kCal, 90-100 grams of protein per day   Plan:  - Continue  Clinimix E 5/15 at 75 ml/hr + IVFE at 10 ml/hr on Mon and Fri only.  TPN provides a daily average of 1415 kCal and 90gm of protein daily, meeting 100% of patient's needs. - Remove insulin from TPN - Check labs in AM, watch K+ closely - Awaiting decision to proceed with comfort measures  Talbert Cage, PharmD Pager:  [phone] 07/20/2014, 7:20 AM

## 2014-07-21 DIAGNOSIS — G894 Chronic pain syndrome: Secondary | ICD-10-CM

## 2014-07-21 LAB — BASIC METABOLIC PANEL
Anion gap: 7 (ref 5–15)
BUN: 32 mg/dL — ABNORMAL HIGH (ref 6–23)
CALCIUM: 7.9 mg/dL — AB (ref 8.4–10.5)
CO2: 34 mEq/L — ABNORMAL HIGH (ref 19–32)
Chloride: 103 mEq/L (ref 96–112)
Creatinine, Ser: 0.39 mg/dL — ABNORMAL LOW (ref 0.50–1.10)
GLUCOSE: 87 mg/dL (ref 70–99)
Potassium: 3.7 mEq/L (ref 3.7–5.3)
SODIUM: 144 meq/L (ref 137–147)

## 2014-07-21 LAB — GLUCOSE, CAPILLARY
GLUCOSE-CAPILLARY: 129 mg/dL — AB (ref 70–99)
GLUCOSE-CAPILLARY: 126 mg/dL — AB (ref 70–99)
Glucose-Capillary: 124 mg/dL — ABNORMAL HIGH (ref 70–99)
Glucose-Capillary: 135 mg/dL — ABNORMAL HIGH (ref 70–99)
Glucose-Capillary: 160 mg/dL — ABNORMAL HIGH (ref 70–99)
Glucose-Capillary: 96 mg/dL (ref 70–99)

## 2014-07-21 MED ORDER — TRACE MINERALS CR-CU-F-FE-I-MN-MO-SE-ZN IV SOLN
INTRAVENOUS | Status: AC
  Administered 2014-07-21: 18:00:00 via INTRAVENOUS
  Filled 2014-07-21: qty 2000

## 2014-07-21 NOTE — Progress Notes (Signed)
[place] TEAM 1 - Stepdown/ICU TEAM Progress Note  Katelyn Rojas QAS:[id number] DOB: [birth year] DOA: 07/01/2014 PCP: Katelyn Cranker, MD  Admit HPI / Brief Narrative: 73 year old with a PMHx PEA arrest. Because of underlying severe COPD she has become chronically dependent on the ventilator. At some point she had a PEG tube placed by interventional radiologist and was discharged to an [facility]. On 06/17/2014 she was admitted from the LTAC due to concerns of stool draining from the PEG tube. After evaluation it was proven she had a trans-colonic gastrostomy tube. She later underwent extensive surgical repair that included creation of right colostomy and placement of a new open gastrostomy tube and was subsequently discharged back to [facility] on 06/30/2014. She also has a history of MRSA and enterococcus bacteremia and prior tracheitis with cultures positive for Alcaligenes bacteria.   She was sent to [facility] again on 07/01/2014 due to concerns of dislodgment of the recently placed gastrostomy tube. Prior to presentation to the emergency department there were several attempts to replace the tube that were unsuccessful. In the emergency department the previous tube was completely removed by the surgeon and a new tube was placed. Placement was checked via CT scan and unfortunately the replaced tube was not in proper position and it also had to be removed. Since the stomach was sutured to the anterior abdominal wall it was felt that endoscopic placement of feeding tube would be safest approach. Gastroenterology was consulted and on 07/03/2014 patient underwent successful PEG tube placement. In the interim the patient's prior large abdom wound dehisced and it was determined that closing the wound in the OR could not be safely accomplished therefore the wound was covered with an Eakin's pouch. Attempts had been made to initiate tube feedings but patient experienced nausea and vomiting so the gastric tube was  placed to gravity. Overall prognosis poor and the critical care physician had an extensive conversation with the patient's family on 07/08/2014. They did agree to a DO NOT RESUSCITATE but otherwise planned to continue aggressive care.  Subsequently trickle tube feedings were resumed with the addition of Reglan on 8/27. Unfortunately she did not tolerate resumption of tube feedings with noted increased leaking of tube feeding around the insertion site. After evaluation by the surgical team it was determined that the tube feedings need to remain off at this point.  On 8/28 the patient was c/o increased severity of her abdominal pain and exam revealed the patient's wound had dehisced further such that the two separate wounds had become one large wound, with bowel visible just below a minimal layer of granulation tissue. Dr. Sherral Rojas met with the patient's family on 07/11/2014 to discuss patient's prognosis and to review goals of care.   HPI/Subjective: Sedated on IV morphine infusion. Appears comfortable.  Assessment/Plan:  Chronic respiratory failure/Dependent on ventilator/Trach/ severe COPD  Once family is ready, a terminal vent wean would be appropriate   Chronic hypotension  BP stable at her baseline   Palliative care meeting 8/29 Dr. Sherral Rojas met with Katelyn Rojas (niece), Katelyn Rojas (son), and Katelyn Rojas (son/on the phone) - the family decided they do not want to escalate care, however they would like to continue current level of treatment - hopefully this would give Katelyn Rojas (son) time to travel from [place] to be at mothers bedside, at which time care will be transitioned to full comfort only-9/8: Katelyn Rojas he arrived today from [place], also in room was patient's niece Katelyn Rojas and Katelyn Rojas's wife. Dr. Sherral Rojas had an extensive conversation with  them regarding expectations of end-of-life care. They have verbalized they would like to take Katelyn Rojas home on the ventilator. They were made aware that likely she  could go home on the ventilator and morphine infusions but it is doubtful that discharge would pay for nor would it be appropriate in end-of-life care to continue TNA  PEG tube malfunction / Colon perforation / Open wound of abdomen  surgical team had a discussion with the patient's niece on 07/13/2014 and frankly explained that TNA is not a long-term option  - there are also no options for enteral feeding unfortunately  - 9/8 spoke at great length with family to include Katelyn Rojas, and Katelyn Rojas's wife. Counseled family her current abdom wound/situation is a terminal condition. Katelyn Rojas (niece) will contact [employer]  and company providing patient's home ventilation equipment. Goal is to get patient home and keep comfortable until her passing.  Edema of right upper extremity  PICC in same arm - venous duplex no DVT   Type II DM  Experienced some hypoglycemia over the last 24hrs - insulin removed from TNA   Anemia, chronic disease  Hemoglobin has been stable around 8-8.5   Hypokalemia  Replete via TNA and when necessary IV boluses  Protein-calorie malnutrition, severe Did not tolerate trickle tube feedings - continue TNA short term, but long term use would be inhumane   Chronic dementia  -she has told multiple providers that she does not wish to live "this way" -Today in the presence of RN Katelyn Rojas and daughter-in-law patient stated she wished they would allow her to die  Sacral decubitus ulcer, stage III Continue treatment per surgery recs  Chronic pain -Secondary to dehiscence of abdominal incision, and lack of functioning of GI tract -[name] family would like to investigate taking patient home.  Code Status:  DNR Family Communication: no family present at time of exam  Disposition Plan: SDU - awaiting arrival of family from out of town, at which time plan will be to discuss/begin terminal wean   Consultants: Katelyn Rojas (surgery) Katelyn Rojas (GI) Katelyn Rojas  (PCCM)  Antibiotics Cipro 8/5 >>> 9/04 Flagyl 8/5 >>> 9/04 Vancomycin 8/5 >>> 9/03 Fluconazole 8/25 >>> 9/05  DVT prophylaxis: Subcutaneous heparin  Objective: Blood pressure 120/59, pulse 108, temperature 98.3 F (36.8 C), temperature source Axillary, resp. rate 20, height _0  (1.549 m), weight 126 lb 15.8 oz (57.6 kg), SpO2 99.00%.  Intake/Output Summary (Last 24 hours) at 07/21/14 1125 Last data filed at 07/21/14 0900  Gross per 24 hour  Intake 2312.5 ml  Output   2075 ml  Net  237.5 ml   Exam: Gen: No acute respiratory distress - sedated comfortably  Resp:  Course crackles th/o all fields -remains ventilator dependent Adbom:  Large open wound dressed and covered for the most part with a very large Eakin's pouch-PEG tube to straight drain Ext: 2+ diffuse edema/anasarca   Data Reviewed: Basic Metabolic Panel:  Recent Labs Lab 07/16/14 0338 07/17/14 0307 07/18/14 0254 07/19/14 0810 07/20/14 0450 07/21/14 0500  NA 144 142 147 145 143 144  K 4.0 2.9* 3.0* 4.0 4.0 3.7  CL 102 104 108 107 105 103  CO2 33* 31 31 32 32 34*  GLUCOSE 147* 128* 60* 75 120* 87  BUN 33* 31* 28* 33* 34* 32*  CREATININE 0.39* 0.42* 0.42* 0.43* 0.40* 0.39*  CALCIUM 7.9* 7.6* 7.9* 7.9* 7.8* 7.9*  MG 1.9  --   --   --  1.8  --  PHOS 3.2  --   --   --  3.3  --     Liver Function Tests:  Recent Labs Lab 07/16/14 0338 07/20/14 0450  AST 34 19  ALT 13 9  ALKPHOS 89 99  BILITOT 0.4 0.9  PROT 5.1* 4.4*  ALBUMIN 2.1* 1.7*   CBC:  Recent Labs Lab 07/20/14 0450  WBC 8.1  NEUTROABS 7.4  HGB 8.2*  HCT 26.6*  MCV 98.2  PLT 229   CBG:  Recent Labs Lab 07/20/14 1539 07/20/14 2022 07/21/14 0010 07/21/14 0440 07/21/14 0741  GLUCAP 77 130* 124* 96 160*   Studies:  Recent x-ray studies have been reviewed in detail by the Attending Physician  Scheduled Meds:  Scheduled Meds: . antiseptic oral rinse  7 mL Mouth Rinse QID  . chlorhexidine  15 mL Mouth Rinse BID  .  insulin aspart  0-15 Units Subcutaneous 6 times per day    Time spent on care of this patient: 25 mins  Erin Hearing, ANP [facility] For Consults/Admissions - Flow Manager - [phone] Office  [phone] Pager [phone]  On-Call/Text Page:      [email or web address]      password [facility]  07/21/2014, 11:25 AM   LOS: 20 days  Examined patient and discussed assessment and plan with ANP Ebony Hail and agree with above. Spoke at length with Katelyn Rojas (niece), Katelyn Rojas (son from [place]), and Katelyn Rojas's wife. And should all questions pertaining to patient's health. Counseled family at length that patient is terminal. Patient with multiple complex medical problems> 60 minutes spent in direct patient care

## 2014-07-21 NOTE — Progress Notes (Signed)
PARENTERAL NUTRITION CONSULT NOTE - FOLLOW UP  Pharmacy Consult: TPN Indication: PEG tube malfunction; Intolerance to TF   Patient Measurements: Height: 5\' 1"  (154.9 cm) Weight: 126 lb 15.8 oz (57.6 kg) IBW/kg (Calculated) : 47.8  Vital Signs: Temp: 99.2 F (37.3 C) (09/08 0400) Temp src: Axillary (09/08 0400) BP: 86/45 mmHg (09/08 0737) Pulse Rate: 104 (09/08 0737) Intake/Output from previous day: 09/07 0701 - 09/08 0700 In: 2424.5 [I.V.:494; TPN:1930.5] Out: 2225 [Urine:2225]  Labs:  Recent Labs  07/20/14 0450  WBC 8.1  HGB 8.2*  HCT 26.6*  PLT 229     Recent Labs  07/19/14 0810 07/20/14 0450 07/21/14 0500  NA 145 143 144  K 4.0 4.0 3.7  CL 107 105 103  CO2 32 32 34*  GLUCOSE 75 120* 87  BUN 33* 34* 32*  CREATININE 0.43* 0.40* 0.39*  CALCIUM 7.9* 7.8* 7.9*  MG  --  1.8  --   PHOS  --  3.3  --   PROT  --  4.4*  --   ALBUMIN  --  1.7*  --   AST  --  19  --   ALT  --  9  --   ALKPHOS  --  99  --   BILITOT  --  0.9  --   PREALBUMIN  --  17.1*  --   TRIG  --  47  --    Estimated Creatinine Clearance: 52.6 ml/min (by C-G formula based on Cr of 0.39).    Recent Labs  07/20/14 2022 07/21/14 0010 07/21/14 0440  GLUCAP 130* 124* 96     Insulin Requirements in the past 24 hours:  2 units moderate SSI; 15 units regular insulin in TPN  Assessment: 73 year old female transferred from [name] with PEG malfunctioning.  Pharmacy consulted to manage TPN for nutritional support.  GI: 8/5 - 8/18 > admission to [facility]  8/14: OR for segmental transverse colectomy, wedge gastrectomy, open Stamm gastrostomy, and end colostomy  8/19: PEG dislodged, attempted to replace at [name]. Drainage at site, to ED 8/21: PEG replaced in ENDO by Dr Elnoria Howard. Started on TPN per [facility] recs. 8/22: No signs of peritonitis, wait at least 48 hours before starting TF 8/23: Wound dehiscence, concerning that fistulas may develop.  No TF until 8/24 at the earliest.  8/24: TF (Osmolite 1.2)  started at 43ml/hr (goal rate =30 ml/hr). 8/25: Actively vomiting; also with black secretions around trach site. Abd distended. TF on hold. Prealbumin up to 23.3 (wnl). 8/26: TF restarted 8/25  8/27: Although initial residual high, pt tolerated overnight with no residuals this morning 8/28: PEG tube noted to be leaking so TF stopped. 8/29: Family meeting held.  Moving to comfort measures.  Awaiting son's arrival from [place]. 8/31: wound still dehisced with visible bowel just under tissue; TF on hold - prealbumin trended down, slightly below normal range 9/8: Pre albumin up to 17.1   Cards: hypotensive, HR remains elevated Endo: DM - cbgs labile, had hypoglycemic episode on 9/6 . Will target CBGs <180 to avoid repeat hypoglcemia, CBGs 77-130   No longer on steroids Lytes: K 3.7, CoCa 9.64, Mag 1.8, Phos 3.3 Renal: SCr stable -  UOP 0.9 ml/kg/hr Hepatobil: LFT's WNL. TG 47 (TG 332 on 8/22 d/t labs being drawn from TPN port) ID: Antibiotics completed afebrile Pulm: intubated, FiO2 30% Best Practices: PPI IV TPN Access: 8/6 double-lumen PICC line TPN day#: 8/10 >> 8/18; resumed 8/21>>  Current Nutrition:  TPN  Nutritional  Goals:   1200-1400 kCal, 90-100 grams of protein per day   Plan:  - Continue Clinimix E 5/15 at 75 ml/hr + IVFE at 10 ml/hr on Mon and Fri only.  TPN provides a daily average of 1415 kCal and 90gm of protein daily, meeting 100% of patient's needs. - Daily MVI and TE  - Remove insulin from TPN - Check labs in AM, watch K+ closely - Awaiting decision to proceed with comfort measures  Vinnie Level, PharmD.  Clinical Pharmacist Pager [phone]

## 2014-07-22 LAB — GLUCOSE, CAPILLARY
GLUCOSE-CAPILLARY: 114 mg/dL — AB (ref 70–99)
GLUCOSE-CAPILLARY: 154 mg/dL — AB (ref 70–99)
Glucose-Capillary: 171 mg/dL — ABNORMAL HIGH (ref 70–99)
Glucose-Capillary: 110 mg/dL — ABNORMAL HIGH (ref 70–99)
Glucose-Capillary: 185 mg/dL — ABNORMAL HIGH (ref 70–99)
Glucose-Capillary: 93 mg/dL (ref 70–99)

## 2014-07-22 LAB — BASIC METABOLIC PANEL
ANION GAP: 6 (ref 5–15)
BUN: 32 mg/dL — ABNORMAL HIGH (ref 6–23)
CO2: 34 mEq/L — ABNORMAL HIGH (ref 19–32)
Calcium: 8.1 mg/dL — ABNORMAL LOW (ref 8.4–10.5)
Chloride: 104 mEq/L (ref 96–112)
Creatinine, Ser: 0.38 mg/dL — ABNORMAL LOW (ref 0.50–1.10)
GFR calc Af Amer: >90 mL/min (ref >90)
GFR calc non Af Amer: >90 mL/min (ref >90)
Glucose, Bld: 176 mg/dL — ABNORMAL HIGH (ref 70–99)
Potassium: 3.9 mEq/L (ref 3.7–5.3)
Sodium: 144 mEq/L (ref 137–147)

## 2014-07-22 MED ORDER — TRACE MINERALS CR-CU-F-FE-I-MN-MO-SE-ZN IV SOLN
INTRAVENOUS | Status: AC
  Administered 2014-07-22: 17:00:00 via INTRAVENOUS
  Filled 2014-07-22: qty 2000

## 2014-07-22 NOTE — Consult Note (Signed)
WOC follow-up:  CCS team following for assessment and plan of care for abd wound. Unchanged in appearance from previous consult. Mepitel contact layer to protect over exposed bowel and moist gauze dressing applied. Small amt yellow drainage from site leaking, no odor. Bedside nurses changing dressings BID over the site. Changed kayara pouch. Stoma red and viable, 1 1/2 inches and above skin level.Some mucocutaneous separation occurring from 10:00 o'clock to 2:00 o'clock. Minimal amt brown liquid, no formed stool. Supplies at bedside for staff nurse use. No family present. Cammie Mcgee MSN, RN, CWOCN, [place], CNS  [phone]

## 2014-07-22 NOTE — Progress Notes (Addendum)
NUTRITION FOLLOW UP  DOCUMENTATION CODES Per approved criteria  -Severe malnutrition in the context of chronic illness   INTERVENTION:  TPN per pharmacy RD to follow for nutrition care plan  NUTRITION DIAGNOSIS: Inadequate oral intake related to inability to eat as evidenced by NPO status, ongoing  Goal: Pt to meet >/= 90% of their estimated nutrition needs, met  Monitor:  TPN prescription, goals of care, respiratory status, weight, labs, I/O's   ASSESSMENT: 73 year old female from [name]. Chronic vent/trach. D/c from [facility] 8/18 to [name], PEG noted to be dislodged 8/19. It was replaced at [name] but continued to have drainage and bubbling around tube. Brought to ED for evaluation. PCCM called for admission.   8/24:  PEG tube ready to be used.  Surgery note reviewed.  Pt c/o pain over abdomen & nausea.  RD consulted for TF initiation & management.  Will advance slowly.  Patient is currently on ventilator support -- trach MV: 5.6 L/min Temp (24hrs), Avg:97.3 F (36.3 C), Min:96.5 F (35.8 C), Max:97.7 F (36.5 C)   Patient is receiving TPN with Clinimix E 5/15 @ 80 ml/hr and lipids @ 10 ml/hr. Provides kcal 1843 and 96 grams protein per day. Meets 153% minimum estimated energy needs and 100% minimum estimated protein needs.  8/27:   Noted patient with significant pain/bloating in abdomen along with vomiting.  TF via PEG tube held 8/25.  Reglan added.  TF resumed last night.  Osmolite 1.2 formula currently infusing at 15 ml/hr providing 432 kcals, 20 gm protein, 295 ml of free water.  Grim prognosis.  Patient is receiving TPN with Clinimix E 5/15 @ 65 ml/hr and lipids @ 10 ml/hr.  Provides 1588 kcal and 78 grams protein per day. Meets 132% minimum estimated energy needs and 86% minimum estimated protein needs.  9/2:  Osmolite 1.2 formula via PEG tube discontinued 8/29.   9/9:  Chart reviewed.  Family counseled that the pt's current abdom wound/situation is a terminal  condition.  Goal is to get patient home and keep comfortable until her passing.   Patient is receiving TPN with Clinimix E 5/15 @ 75 ml/hr and 20% IVFE at 10 ml/hr on Mon and Fri only.  Provides daily average of 1415 kcal and 90 grams protein.  Meets 100% minimum estimated energy needs and 100% minimum estimated protein needs.  Height: Ht Readings from Last 1 Encounters:  07/02/14 _0  (1.549 m)    Weight: Wt Readings from Last 1 Encounters:  07/22/14 128 lb 4.9 oz (58.2 kg)    BMI:  Body mass index is 24.26 kg/(m^2).  Re-estimated Nutritional Needs: Kcal: 1200-1400 Protein: 90-100 gm Fluid: >/= 1.5 L1  Skin: Stage IV sacral ulcer, abdominal full thickness wound with exposed bowel & drainage  Diet Order: NPO   Intake/Output Summary (Last 24 hours) at 07/22/14 1304 Last data filed at 07/22/14 1000  Gross per 24 hour  Intake    647 ml  Output    525 ml  Net    122 ml    Labs:   Recent Labs Lab 07/16/14 0338  07/20/14 0450 07/21/14 0500 07/22/14 0500  NA 144  < > 143 144 144  K 4.0  < > 4.0 3.7 3.9  CL 102  < > 105 103 104  CO2 33*  < > 32 34* 34*  BUN 33*  < > 34* 32* 32*  CREATININE 0.39*  < > 0.40* 0.39* 0.38*  CALCIUM 7.9*  < > 7.8* 7.9*  8.1*  MG 1.9  --  1.8  --   --   PHOS 3.2  --  3.3  --   --   GLUCOSE 147*  < > 120* 87 176*  < > = values in this interval not displayed.  CBG (last 3)   Recent Labs  07/22/14 0526 07/22/14 0755 07/22/14 1207  GLUCAP 171* 110* 185*    Scheduled Meds: . antiseptic oral rinse  7 mL Mouth Rinse QID  . chlorhexidine  15 mL Mouth Rinse BID  . insulin aspart  0-15 Units Subcutaneous 6 times per day    Continuous Infusions: . sodium chloride 10 mL/hr at 07/22/14 0700  . dextrose Stopped (07/20/14 1724)  . morphine 2 mg/hr (07/22/14 0700)  . Marland KitchenTPN (CLINIMIX-E) Adult 75 mL/hr at 07/22/14 0700  . Marland KitchenTPN (CLINIMIX-E) Adult      Past Medical History  Diagnosis Date  . Hypertension   . COPD (chronic obstructive  pulmonary disease)     oxygen dependent  . Arthritis   . Anxiety   . Cachexia 12/2013  . DM type 2 (diabetes mellitus, type 2)   . 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 Anemia 12/2013  . Dementia   . Compression fracture of lumbar vertebra, non-traumatic 12/2013    L3 and L1.     Past Surgical History  Procedure Laterality Date  . Vaginal hysterectomy      ? Abdominal vs. Vaginal  . Laparotomy N/A 06/26/2014    Procedure: EXPLORATORY LAPAROTOMY;  Surgeon: Rolm Bookbinder, MD;  Location: [place];  Service: General;  Laterality: N/A;  . Partial colectomy N/A 06/26/2014    Procedure: TRANSVERSE COLECTOMY/COLOSTOMY;  Surgeon: Rolm Bookbinder, MD;  Location: [place];  Service: General;  Laterality: N/A;  . Gastrostomy N/A 06/26/2014    Procedure: NEW GASTROSTOMY TUBE;  Surgeon: Rolm Bookbinder, MD;  Location: [place];  Service: General;  Laterality: N/A;  . Peg placement N/A 07/03/2014    Procedure: PERCUTANEOUS ENDOSCOPIC GASTROSTOMY (PEG) PLACEMENT;  Surgeon: Beryle Beams, MD;  Location: [place];  Service: Endoscopy;  Laterality: N/A;    Arthur Holms, RD, LDN Pager #: [phone] After-Hours Pager #: [phone]

## 2014-07-22 NOTE — Progress Notes (Signed)
[place] TEAM 1 - Stepdown/ICU TEAM Progress Note  Katelyn Rojas PFX:[id number] DOB: 12-19-1940 DOA: 07/01/2014 PCP: Gildardo Cranker, MD  Admit HPI / Brief Narrative: 73 year old with a PMHx PEA arrest. Because of underlying severe COPD she has become chronically dependent on the ventilator. At some point she had a PEG tube placed by interventional radiologist and was discharged to an [facility]. On 06/17/2014 she was admitted from the LTAC due to concerns of stool draining from the PEG tube. After evaluation it was proven she had a trans-colonic gastrostomy tube. She later underwent extensive surgical repair that included creation of right colostomy and placement of a new open gastrostomy tube and was subsequently discharged back to [facility] on 06/30/2014. She also has a history of MRSA and enterococcus bacteremia and prior tracheitis with cultures positive for Alcaligenes bacteria.   She was sent to [facility] again on 07/01/2014 due to concerns of dislodgment of the recently placed gastrostomy tube. Prior to presentation to the emergency department there were several attempts to replace the tube that were unsuccessful. In the emergency department the previous tube was completely removed by the surgeon and a new tube was placed. Placement was checked via CT scan and unfortunately the replaced tube was not in proper position and it also had to be removed. Since the stomach was sutured to the anterior abdominal wall it was felt that endoscopic placement of feeding tube would be safest approach. Gastroenterology was consulted and on 07/03/2014 patient underwent successful PEG tube placement. In the interim the patient's prior large abdom wound dehisced and it was determined that closing the wound in the OR could not be safely accomplished therefore the wound was covered with an Eakin's pouch. Attempts had been made to initiate tube feedings but patient experienced nausea and vomiting so the gastric tube was  placed to gravity. Overall prognosis poor and the critical care physician had an extensive conversation with the patient's family on 07/08/2014. They did agree to a DO NOT RESUSCITATE but otherwise planned to continue aggressive care.  Subsequently trickle tube feedings were resumed with the addition of Reglan on 8/27. Unfortunately she did not tolerate resumption of tube feedings with noted increased leaking of tube feeding around the insertion site. After evaluation by the surgical team it was determined that the tube feedings need to remain off at this point.  On 8/28 the patient was c/o increased severity of her abdominal pain and exam revealed the patient's wound had dehisced further such that the two separate wounds had become one large wound, with bowel visible just below a minimal layer of granulation tissue. Dr. Sherral Hammers met with the patient's family on 07/11/2014 to discuss patient's prognosis and to review goals of care.   9/8 Medical staff met again with all family memebers including son from [place]. Desire expressed to take pt home on vent although based on additional conversations with our case manager family appears to not understand that eventual outcome will be terminal vent wean. 9/9 Palliative Medicine has been consulted to assist and to help with the Hospice portion of discharge planning.  HPI/Subjective: Comfortable on IV Morphine-did not awaken  Assessment/Plan:  Chronic respiratory failure/Dependent on ventilator/Trach/ severe COPD  Once family is ready, a terminal vent wean would be appropriate   Chronic hypotension  BP stable at her baseline   Palliative care meeting 8/29 Dr. Sherral Hammers met with Katelyn Rojas (niece), Katelyn Rojas (son), and Katelyn Rojas (son/on the phone) - the family decided they do not want to escalate care, however  they would like to continue current level of treatment - hopefully this would give Katelyn Rojas (son) time to travel from [place] to be at mothers bedside, at which time  care will be transitioned to full comfort only-9/8: Katelyn Rojas he arrived today from [place], also in room was patient's niece Katelyn Rojas and Katelyn Rojas. Dr. Sherral Hammers had an extensive conversation with them regarding expectations of end-of-life care. They have verbalized they would like to take Ms. McDermitt home on the ventilator.9/9 Have decided to ask Palliative Medicine to see pt/family-niece took it upon herself to contact her preferred home care agency and explained rationale for home focus with pt's vent dependent status was to "improve quality of life" without explaining pt's dire prognosis-this was explained to that agency after they contacted our case manager Ms. Mayo.  PEG tube malfunction / Colon perforation / Open wound of abdomen  surgical team had a discussion with the patient's niece on 07/13/2014 and frankly explained that TNA is not a long-term option - there are also no options for enteral feeding unfortunately- 9/8 MD spoke at great length with family (Katelyn Rojas, Katelyn Rojas, and Katelyn Rojas). Counseled family that the pt's current abdom wound/situation is a terminal condition. Katelyn Rojas (niece) will contact [employer]  and company providing patient's home ventilation equipment. Goal is to get patient home and keep comfortable until her passing.  Edema of right upper extremity  PICC in same arm - venous duplex no DVT   Type II DM  Experienced some hypoglycemia over the last 24hrs - insulin removed from TNA   Anemia, chronic disease  Hemoglobin has been stable around 8-8.5   Hypokalemia  Replete via TNA and when necessary IV boluses  Protein-calorie malnutrition, severe Did not tolerate trickle tube feedings - continue TNA short term, but long term use would be inhumane   Chronic dementia  -she has told multiple providers that she does not wish to live "this way"-on 9/8 in the presence of RN Katelyn Rojas and Katelyn Rojas patient stated she wished they would allow her to die  Sacral decubitus  ulcer, stage III Continue treatment per surgery recs  Chronic pain -Secondary to dehiscence of abdominal incision, and lack of functioning of GI tract-[name] family would like to investigate taking patient home.  Code Status:  DNR Family Communication: no family present at time of exam  Disposition Plan: SDU - discharge disposition still being clarified-Palliative assiting  Consultants: Dr. Georganna Skeans (surgery) Dr. Carol Ada (GI) Dr. Chesley Mires (PCCM) Palliative Medicine  Antibiotics Cipro 8/5 >>> 9/04 Flagyl 8/5 >>> 9/04 Vancomycin 8/5 >>> 9/03 Fluconazole 8/25 >>> 9/05  DVT prophylaxis: Subcutaneous heparin  Objective: Blood pressure 103/45, pulse 100, temperature 97.7 F (36.5 C), temperature source Axillary, resp. rate 18, height _0  (1.549 m), weight 128 lb 4.9 oz (58.2 kg), SpO2 96.00%.  Intake/Output Summary (Last 24 hours) at 07/22/14 1100 Last data filed at 07/22/14 1000  Gross per 24 hour  Intake    841 ml  Output    775 ml  Net     66 ml   Exam: Gen: No acute respiratory distress - sedated comfortably  Resp:  Course crackles th/o all fields -remains ventilator dependent Adbom:  Large open wound dressed and covered for the most part with a very large Eakin's pouch-PEG tube to straight drain Ext: 2+ diffuse edema/anasarca which has not improved  Data Reviewed: Basic Metabolic Panel:  Recent Labs Lab 07/16/14 0338  07/18/14 0254 07/19/14 0810 07/20/14 0450 07/21/14 0500 07/22/14 0500  NA  144  < > 147 145 143 144 144  K 4.0  < > 3.0* 4.0 4.0 3.7 3.9  CL 102  < > 108 107 105 103 104  CO2 33*  < > 31 32 32 34* 34*  GLUCOSE 147*  < > 60* 75 120* 87 176*  BUN 33*  < > 28* 33* 34* 32* 32*  CREATININE 0.39*  < > 0.42* 0.43* 0.40* 0.39* 0.38*  CALCIUM 7.9*  < > 7.9* 7.9* 7.8* 7.9* 8.1*  MG 1.9  --   --   --  1.8  --   --   PHOS 3.2  --   --   --  3.3  --   --   < > = values in this interval not displayed.  Liver Function Tests:  Recent  Labs Lab 07/16/14 0338 07/20/14 0450  AST 34 19  ALT 13 9  ALKPHOS 89 99  BILITOT 0.4 0.9  PROT 5.1* 4.4*  ALBUMIN 2.1* 1.7*   CBC:  Recent Labs Lab 07/20/14 0450  WBC 8.1  NEUTROABS 7.4  HGB 8.2*  HCT 26.6*  MCV 98.2  PLT 229   CBG:  Recent Labs Lab 07/21/14 1736 07/21/14 1937 07/22/14 0057 07/22/14 0526 07/22/14 0755  GLUCAP 135* 126* 114* 171* 110*   Studies:  Recent x-ray studies have been reviewed in detail by the Attending Physician  Scheduled Meds:  Scheduled Meds: . antiseptic oral rinse  7 mL Mouth Rinse QID  . chlorhexidine  15 mL Mouth Rinse BID  . insulin aspart  0-15 Units Subcutaneous 6 times per day    Time spent on care of this patient: 15 mins  Erin Hearing, ANP [facility] For Consults/Admissions - Flow Manager - [phone] Office  [phone] Pager [phone]  On-Call/Text Page:      [email or web address]      password [email or web address]  07/22/2014, 11:00 AM   LOS: 21 days   I have personally examined this patient and reviewed the entire database. I have reviewed the above note, made any necessary editorial changes, and agree with its content.  Cherene Altes, MD [facility]

## 2014-07-22 NOTE — Progress Notes (Signed)
PARENTERAL NUTRITION CONSULT NOTE - FOLLOW UP  Pharmacy Consult: TPN Indication: PEG tube malfunction; Intolerance to TF   Patient Measurements: Height: 5\' 1"  (154.9 cm) Weight: 128 lb 4.9 oz (58.2 kg) IBW/kg (Calculated) : 47.8  Vital Signs: Temp: 97.7 F (36.5 C) (09/09 0745) Temp src: Axillary (09/09 0745) BP: 103/45 mmHg (09/09 0815) Pulse Rate: 100 (09/09 0815) Intake/Output from previous day: 09/08 0701 - 09/09 0700 In: 1197 [I.V.:322; TPN:875] Out: 800 [Urine:675; Drains:125]  Labs:  Recent Labs  07/20/14 0450  WBC 8.1  HGB 8.2*  HCT 26.6*  PLT 229     Recent Labs  07/20/14 0450 07/21/14 0500 07/22/14 0500  NA 143 144 144  K 4.0 3.7 3.9  CL 105 103 104  CO2 32 34* 34*  GLUCOSE 120* 87 176*  BUN 34* 32* 32*  CREATININE 0.40* 0.39* 0.38*  CALCIUM 7.8* 7.9* 8.1*  MG 1.8  --   --   PHOS 3.3  --   --   PROT 4.4*  --   --   ALBUMIN 1.7*  --   --   AST 19  --   --   ALT 9  --   --   ALKPHOS 99  --   --   BILITOT 0.9  --   --   PREALBUMIN 17.1*  --   --   TRIG 47  --   --    Estimated Creatinine Clearance: 52.9 ml/min (by C-G formula based on Cr of 0.38).    Recent Labs  07/22/14 0057 07/22/14 0526 07/22/14 0755  GLUCAP 114* 171* 110*     Insulin Requirements in the past 24 hours:  14 units moderate SSI  Assessment: 73 year old female transferred from [name] with PEG malfunctioning.  Pharmacy consulted to manage TPN for nutritional support.  GI: 8/5 - 8/18 > admission to [facility]  8/14: OR for segmental transverse colectomy, wedge gastrectomy, open Stamm gastrostomy, and end colostomy  8/19: PEG dislodged, attempted to replace at [name]. Drainage at site, to ED 8/21: PEG replaced in ENDO by Dr Elnoria Howard. Started on TPN per [facility] recs. 8/22: No signs of peritonitis, wait at least 48 hours before starting TF 8/23: Wound dehiscence, concerning that fistulas may develop.  No TF until 8/24 at the earliest.  8/24: TF (Osmolite 1.2) started at 2ml/hr  (goal rate =30 ml/hr). 8/25: Actively vomiting; also with black secretions around trach site. Abd distended. TF on hold. Prealbumin up to 23.3 (wnl). 8/26: TF restarted 8/25  8/27: Although initial residual high, pt tolerated overnight with no residuals this morning 8/28: PEG tube noted to be leaking so TF stopped. 8/29: Family meeting held.  Moving to comfort measures.  Awaiting son's arrival from [place]. 8/31: wound still dehisced with visible bowel just under tissue; TF on hold - prealbumin trended down, slightly below normal range 9/8: Pre albumin up to 17.1  9/9: Per conversation with RN, it seems patient's family wants to continue current level of care    Cards: hypotensive, HR remains elevated Endo: DM - cbgs labile, had hypoglycemic episode on 9/6 . Will target CBGs <180 to avoid repeat hypoglcemia, CBGs controlled, No longer on steroids Lytes: K 3.9, CoCa 9.64, Mag 1.8, Phos 3.3 Renal: SCr stable -  UOP 0.9 ml/kg/hr Hepatobil: LFT's WNL. TG 47 (TG 332 on 8/22 d/t labs being drawn from TPN port) ID: Antibiotics completed afebrile Pulm: intubated, FiO2 30% Best Practices: PPI IV TPN Access: 8/6 double-lumen PICC line TPN day#: 8/10 >>  8/18; resumed 8/21>>  Current Nutrition:  TPN  Nutritional Goals:   1200-1400 kCal, 90-100 grams of protein per day   Plan:  - Continue Clinimix E 5/15 at 75 ml/hr + IVFE at 10 ml/hr on Mon and Fri only.  TPN provides a daily average of 1415 kCal and 90gm of protein daily, meeting 100% of patient's needs. - Daily MVI and TE  - Check labs in AM, watch K+ closely - Awaiting decision to proceed with comfort measures  Vinnie Level, PharmD.  Clinical Pharmacist Pager [phone]

## 2014-07-22 NOTE — Progress Notes (Signed)
Thank you for consulting the Palliative Medicine Team at [facility] to meet your patient's and family's needs.   The reason that you asked [place] to see your patient is  For  Clarification of GOC and options  We have scheduled your patient for a meeting: 07-23-14 at 1000 am  The Surrogate decision make is: Paulla Dolly (son)  Other family members that need to be present: Son [name] and niece Jannet Mantis and several other family members  Lorinda Creed NP  Palliative Medicine Team Team Phone # [phone] Pager [phone]

## 2014-07-23 DIAGNOSIS — R1084 Generalized abdominal pain: Secondary | ICD-10-CM

## 2014-07-23 LAB — COMPREHENSIVE METABOLIC PANEL
ALT: 11 U/L (ref 0–35)
ANION GAP: 5 (ref 5–15)
AST: 25 U/L (ref 0–37)
Albumin: 1.6 g/dL — ABNORMAL LOW (ref 3.5–5.2)
Alkaline Phosphatase: 180 U/L — ABNORMAL HIGH (ref 39–117)
BUN: 31 mg/dL — AB (ref 6–23)
CALCIUM: 8.2 mg/dL — AB (ref 8.4–10.5)
CHLORIDE: 101 meq/L (ref 96–112)
CO2: 37 mEq/L — ABNORMAL HIGH (ref 19–32)
Creatinine, Ser: 0.36 mg/dL — ABNORMAL LOW (ref 0.50–1.10)
GLUCOSE: 185 mg/dL — AB (ref 70–99)
POTASSIUM: 3.7 meq/L (ref 3.7–5.3)
SODIUM: 143 meq/L (ref 137–147)
Total Bilirubin: 0.4 mg/dL (ref 0.3–1.2)
Total Protein: 5.1 g/dL — ABNORMAL LOW (ref 6.0–8.3)

## 2014-07-23 LAB — PHOSPHORUS: Phosphorus: 3.2 mg/dL (ref 2.3–4.6)

## 2014-07-23 LAB — GLUCOSE, CAPILLARY
GLUCOSE-CAPILLARY: 178 mg/dL — AB (ref 70–99)
GLUCOSE-CAPILLARY: 88 mg/dL (ref 70–99)
Glucose-Capillary: 126 mg/dL — ABNORMAL HIGH (ref 70–99)
Glucose-Capillary: 155 mg/dL — ABNORMAL HIGH (ref 70–99)
Glucose-Capillary: 97 mg/dL (ref 70–99)

## 2014-07-23 LAB — MAGNESIUM: Magnesium: 1.7 mg/dL (ref 1.5–2.5)

## 2014-07-23 MED ORDER — MORPHINE SULFATE 2 MG/ML IJ SOLN: 2.0000 mg | INTRAMUSCULAR | Status: AC | PRN

## 2014-07-23 MED ORDER — ACETAMINOPHEN 650 MG RE SUPP: 650.0000 mg | Freq: Four times a day (QID) | RECTAL | Status: AC | PRN

## 2014-07-23 MED ORDER — HEPARIN SODIUM (PORCINE) 5000 UNIT/ML IJ SOLN: 5000.0000 [IU] | Freq: Three times a day (TID) | INTRAMUSCULAR | Status: AC

## 2014-07-23 MED ORDER — LORAZEPAM 2 MG/ML IJ SOLN: 1.0000 mg | INTRAMUSCULAR | Status: AC | PRN

## 2014-07-23 MED ORDER — ALBUTEROL SULFATE (2.5 MG/3ML) 0.083% IN NEBU: 2.5000 mg | INHALATION_SOLUTION | RESPIRATORY_TRACT | Status: AC | PRN

## 2014-07-23 MED ORDER — ONDANSETRON HCL 4 MG/2ML IJ SOLN: 4.0000 mg | Freq: Three times a day (TID) | INTRAMUSCULAR | Status: AC | PRN

## 2014-07-23 MED ORDER — MORPHINE SULFATE 10 MG/ML IJ SOLN: 3.0000 mg/h | INTRAVENOUS | Status: AC

## 2014-07-23 MED ORDER — PANTOPRAZOLE SODIUM 40 MG IV SOLR: 40.0000 mg | Freq: Every day | INTRAVENOUS | Status: AC

## 2014-07-23 MED ORDER — SODIUM CHLORIDE 0.9 % IV SOLN: 10.0000 mL | INTRAVENOUS | Status: AC

## 2014-07-23 MED ORDER — INSULIN ASPART 100 UNIT/ML ~~LOC~~ SOLN: SUBCUTANEOUS | Status: AC

## 2014-07-23 MED ORDER — HEPARIN SODIUM (PORCINE) 5000 UNIT/ML IJ SOLN
5000.0000 [IU] | Freq: Three times a day (TID) | INTRAMUSCULAR | Status: DC
  Administered 2014-07-23: 5000 [IU] via SUBCUTANEOUS
  Filled 2014-07-23 (×3): qty 1

## 2014-07-23 MED ORDER — LORAZEPAM 2 MG/ML IJ SOLN
1.0000 mg | INTRAMUSCULAR | Status: DC | PRN
  Administered 2014-07-23 (×2): 2 mg via INTRAVENOUS
  Filled 2014-07-23 (×2): qty 1

## 2014-07-23 MED ORDER — CETYLPYRIDINIUM CHLORIDE 0.05 % MT LIQD: 7.0000 mL | Freq: Four times a day (QID) | OROMUCOSAL | Status: AC

## 2014-07-23 MED ORDER — TRACE MINERALS CR-CU-F-FE-I-MN-MO-SE-ZN IV SOLN
INTRAVENOUS | Status: DC
  Administered 2014-07-23: 18:00:00 via INTRAVENOUS
  Filled 2014-07-23: qty 2000

## 2014-07-23 MED ORDER — CHLORHEXIDINE GLUCONATE 0.12 % MT SOLN: 15.0000 mL | Freq: Two times a day (BID) | OROMUCOSAL | Status: AC

## 2014-07-23 MED ORDER — PANTOPRAZOLE SODIUM 40 MG IV SOLR
40.0000 mg | Freq: Every day | INTRAVENOUS | Status: DC
  Administered 2014-07-23: 40 mg via INTRAVENOUS
  Filled 2014-07-23: qty 40

## 2014-07-23 NOTE — Progress Notes (Signed)
eLink Physician-Brief Progress Note Patient Name: Sheridyn Satkowski DOB: [birth year] MRN: [id number]   Date of Service  07/23/2014  HPI/Events of Note    eICU Interventions  Add stress ulcer and dvt prophylaxis     Intervention Category Intermediate Interventions: Best-practice therapies (e.g. DVT, beta blocker, etc.)  MCQUAID, DOUGLAS 07/23/2014, 1:40 AM

## 2014-07-23 NOTE — Consult Note (Signed)
Patient Katelyn Rojas      DOB: May 17, 1941      ZHY:[id number]     Consult Note from the Palliative Medicine Team at [facility]    Consult Requested by: Dr Joseph Art     PCP: Kirt Boys, MD Reason for Consultation:Clrification of GOC and options     Phone Number:[phone]  Assessment of patients Current state:  Continued physical and functional decline over the past several months, complicated medical conditions.   Chronic respiratory failure/ COPD/ventilator dependant/Trach, PEG for nutritional support/colon perforation/ open abdominal wound/pain, protein calorie malnutrition, sacral decubitus.  Overall poor prognosis.  Consult is for review of medical treatment options, clarification of goals of care and end of life issues, disposition and options, and symptom recommendation.  This NP Lorinda Creed reviewed medical records, received report from team, assessed the patient and then meet at the patient's bedside along with her sons [name]/guardian and [name]/neice,  to discuss diagnosis prognosis, GOC, EOL wishes disposition and options.  RN from [employer] joined meeting for a portion of the time  A detailed discussion was had today regarding natural trajectory and expectations at EOL.   Concepts specific to code status, artifical feeding and hydration, continued IV antibiotics and rehospitalization was had.  The difference between a aggressive medical intervention path  and a palliative comfort care path for this patient at this time was had.  Values and goals of care important to patient and family were attempted to be elicited.  Concept of Hospice and Palliative Care were discussed  MOST form was detailed and completed.  Hard copy in chart and copy given to family  Questions and concerns addressed.  Hard Choices booklet left for review. Family encouraged to call with questions or concerns.  PMT will continue to support holistically.   Goals of  Care: 1.  Code Status:DNR/DNI   2. Scope of Treatment: 1. Vital Signs: per unit  2. Respiratory/Oxygen: continue ventilator support 3. Nutritional Support/Tube Feeds: continue TPN 4. Antibiotics: yes 5. IVF: yes 6. Review of Medications to be discontinued: continue present medications 7. Labs: as indicatd 8. Telemetry: not once discharge to LTAC   3. Disposition: Family is hopeful to return  to [name].  They verbalize that home is not the best option at this time.    4. Symptom Management:   1. Anxiety/Agitation: Ativan 1-2 mg IV every 2 hrs prn 2. Pain: Presently on Morphine IV gtt at 3 mg/hr 3. Failure to thrive  5. Psychosocial:   Emotional support offered to family.  All understand the overall poor prognosis and all express that comfort is a main priority.  We spoke to the limitations of medicine and the concept of mortality.   Patient Documents Completed or Given: Document Given Completed  Advanced Directives Pkt    MOST  yes  DNR    Gone from [facility]    Hard Choices yes     Brief HPI:  73 yo female multiple co-morbidites, complicated medical issues, chronic vent/trach, PEG for nutritional support with complications, s/p transverse colectomy, gastrectomy and colostomy, continued complications with multiple re hospitalizations and overall failure to thrive, protein calorie malnutrition, decubitus ulcer.  Family struggles with viable treatment options and advanced directive decisions and anticipatory care needs.   ROS: unable to illict   PMH:  Past Medical History  Diagnosis Date  . Hypertension   . 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 Arthritis   . Anxiety   .  Cachexia 12/2013  . DM type 2 (diabetes mellitus, type 2)   . 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 Anemia 12/2013  . Dementia   . Compression fracture of lumbar vertebra, non-traumatic 12/2013    L3 and L1.      PSH: Past Surgical History  Procedure Laterality  Date  . Vaginal hysterectomy      ? Abdominal vs. Vaginal  . Laparotomy N/A 06/26/2014    Procedure: EXPLORATORY LAPAROTOMY;  Surgeon: Emelia Loron, MD;  Location: [facility] OR;  Service: General;  Laterality: N/A;  . Partial colectomy N/A 06/26/2014    Procedure: TRANSVERSE COLECTOMY/COLOSTOMY;  Surgeon: Emelia Loron, MD;  Location: [facility] OR;  Service: General;  Laterality: N/A;  . Gastrostomy N/A 06/26/2014    Procedure: NEW GASTROSTOMY TUBE;  Surgeon: Emelia Loron, MD;  Location: [facility] OR;  Service: General;  Laterality: N/A;  . Peg placement N/A 07/03/2014    Procedure: PERCUTANEOUS ENDOSCOPIC GASTROSTOMY (PEG) PLACEMENT;  Surgeon: Theda Belfast, MD;  Location: [facility] ENDOSCOPY;  Service: Endoscopy;  Laterality: N/A;   I have reviewed the FH and SH and  If appropriate update it with new information. Allergies  Allergen Reactions  . Namenda [Memantine Hcl] Other (See Comments)    "hallucinations"   . Codeine Other (See Comments)    Unknown. Listed on [facility]  . Penicillins Other (See Comments)    Unknown; listed on [facility]   Scheduled Meds: . antiseptic oral rinse  7 mL Mouth Rinse QID  . chlorhexidine  15 mL Mouth Rinse BID  . heparin subcutaneous  5,000 Units Subcutaneous 3 times per day  . insulin aspart  0-15 Units Subcutaneous 6 times per day  . pantoprazole (PROTONIX) IV  40 mg Intravenous Daily   Continuous Infusions: . sodium chloride 10 mL/hr at 07/23/14 0800  . dextrose Stopped (07/20/14 1724)  . morphine 3 mg/hr (07/23/14 0909)  . Marland KitchenTPN (CLINIMIX-E) Adult 75 mL/hr at 07/23/14 0800  . Marland KitchenTPN (CLINIMIX-E) Adult     PRN Meds:.acetaminophen, albuterol, LORazepam, morphine injection, ondansetron    BP 60/38  Pulse 115  Temp(Src) 98.9 F (37.2 C) (Axillary)  Resp 18  Ht 5\' 1"  (1.549 m)  Wt 58 kg (127 lb 13.9 oz)  BMI 24.17 kg/m2  SpO2 99%   PPS:20 %    Intake/Output Summary (Last 24 hours) at 07/23/14 1325 Last data filed at 07/23/14  0905  Gross per 24 hour  Intake   1653 ml  Output   1100 ml  Net    553 ml   Physical Exam:  General: cachetic , frail HEENT:  Moist buccal membranes, noted temporal/facial muscle wasting Chest:   On ventilator equal bilateral air movement, scattered course BS CVS:  Tachycardic Abdomen: noted abdominal dressing, PEG to straight drainage Ext: +2 edema Neuro:  Open eyes to name, attempts to mouth resposne   Labs: CBC    Component Value Date/Time   WBC 8.1 07/20/2014 0450   RBC 2.71* 07/20/2014 0450   HGB 8.2* 07/20/2014 0450   HCT 26.6* 07/20/2014 0450   PLT 229 07/20/2014 0450   MCV 98.2 07/20/2014 0450   MCH 30.3 07/20/2014 0450   MCHC 30.8 07/20/2014 0450   RDW 15.6* 07/20/2014 0450   LYMPHSABS 0.4* 07/20/2014 0450   MONOABS 0.3 07/20/2014 0450   EOSABS 0.1 07/20/2014 0450   BASOSABS 0.0 07/20/2014 0450    BMET    Component Value Date/Time   NA 143 07/23/2014 0500   K  3.7 07/23/2014 0500   CL 101 07/23/2014 0500   CO2 37* 07/23/2014 0500   GLUCOSE 185* 07/23/2014 0500   BUN 31* 07/23/2014 0500   CREATININE 0.36* 07/23/2014 0500   CALCIUM 8.2* 07/23/2014 0500   GFRNONAA >90 07/23/2014 0500   GFRAA >90 07/23/2014 0500    CMP     Component Value Date/Time   NA 143 07/23/2014 0500   K 3.7 07/23/2014 0500   CL 101 07/23/2014 0500   CO2 37* 07/23/2014 0500   GLUCOSE 185* 07/23/2014 0500   BUN 31* 07/23/2014 0500   CREATININE 0.36* 07/23/2014 0500   CALCIUM 8.2* 07/23/2014 0500   PROT 5.1* 07/23/2014 0500   ALBUMIN 1.6* 07/23/2014 0500   AST 25 07/23/2014 0500   ALT 11 07/23/2014 0500   ALKPHOS 180* 07/23/2014 0500   BILITOT 0.4 07/23/2014 0500   GFRNONAA >90 07/23/2014 0500   GFRAA >90 07/23/2014 0500     Time In Time Out Total Time Spent with Patient Total Overall Time  1000 1200 105 min 120 min    Greater than 50%  of this time was spent counseling and coordinating care related to the above assessment and plan.   Lorinda Creed NP  Palliative Medicine Team Team Phone # [phone] Pager  [phone]  Discussed with CMRN/ Hen[place], [name] liason

## 2014-07-23 NOTE — Progress Notes (Signed)
PARENTERAL NUTRITION CONSULT NOTE - FOLLOW UP  Pharmacy Consult: TPN Indication: PEG tube malfunction; Intolerance to TF   Patient Measurements: Height: _0  (154.9 cm) Weight: 127 lb 13.9 oz (58 kg) IBW/kg (Calculated) : 47.8  Vital Signs: Temp: 98.9 F (37.2 C) (09/10 0900) Temp src: Axillary (09/10 0900) BP: 123/55 mmHg (09/10 0826) Pulse Rate: 109 (09/10 0900) Intake/Output from previous day: 09/09 0701 - 09/10 0700 In: 846 [I.V.:246; TPN:600] Out: 1025 [Urine:1025]  Labs: No results found for this basename: WBC, HGB, HCT, PLT, APTT, INR,  in the last 72 hours   Recent Labs  07/21/14 0500 07/22/14 0500 07/23/14 0500  NA 144 144 143  K 3.7 3.9 3.7  CL 103 104 101  CO2 34* 34* 37*  GLUCOSE 87 176* 185*  BUN 32* 32* 31*  CREATININE 0.39* 0.38* 0.36*  CALCIUM 7.9* 8.1* 8.2*  MG  --   --  1.7  PHOS  --   --  3.2  PROT  --   --  5.1*  ALBUMIN  --   --  1.6*  AST  --   --  25  ALT  --   --  11  ALKPHOS  --   --  180*  BILITOT  --   --  0.4   Estimated Creatinine Clearance: 52.8 ml/min (by C-G formula based on Cr of 0.36).    Recent Labs  07/23/14 0004 07/23/14 0424 07/23/14 0903  GLUCAP 126* 155* 97     Insulin Requirements in the past 24 hours:  14 units moderate SSI  Assessment: 73 year old female transferred from [name] with PEG malfunctioning.  Pharmacy consulted to manage TPN for nutritional support.  GI: 8/5 - 8/18 > admission to [facility]  8/14: OR for segmental transverse colectomy, wedge gastrectomy, open Stamm gastrostomy, and end colostomy  8/19: PEG dislodged, attempted to replace at [place]. Drainage at site, to ED 8/21: PEG replaced in ENDO by Dr Benson Norway. Started on TPN per [facility] recs. 8/22: No signs of peritonitis, wait at least 48 hours before starting TF 8/23: Wound dehiscence, concerning that fistulas may develop.  No TF until 8/24 at the earliest.  8/24: TF (Osmolite 1.2) started at 80m/hr (goal rate =30 ml/hr). 8/25: Actively vomiting;  also with black secretions around trach site. Abd distended. TF on hold. Prealbumin up to 23.3 (wnl). 8/26: TF restarted 8/25  8/27: Although initial residual high, pt tolerated overnight with no residuals this morning 8/28: PEG tube noted to be leaking so TF stopped. 8/29: Family meeting held.  Moving to comfort measures.  [place] 8[place]31: wound still dehisced with visible bowel just under tissue; TF on hold - prealbumin trended down, slightly below normal range 9/8: Pre albumin up to 17.1  9/9: Per conversation with RN, it seems patient's family wants to continue current level of care   9/10: Goals of care meeting planned for today   Cards: hypotensive, HR remains elevated Endo: DM - cbgs were labile, now more controlled . Will target CBGs <180 to avoid repeat hypoglcemia Lytes: K 3.7, CoCa 10.12, Mag 1.7, Phos 3.2 Renal: SCr stable -  UOP 0.7 ml/kg/hr Hepatobil: Alk phos 180, other LFTs wnl. TG 47 (TG 332 on 8/22 d/t labs being drawn from TPN port) ID: Antibiotics completed afebrile Pulm: intubated, FiO2 40% Best Practices: PPI IV TPN Access: 8/6 double-lumen PICC line TPN day#: 8/10 >> 8/18; resumed 8/21>>  Current Nutrition:  TPN  Nutritional Goals:   1200-1400  kCal, 90-100 grams of protein per day   Plan:  - Continue Clinimix E 5/15 at 75 ml/hr + IVFE at 10 ml/hr on Mon and Fri only.  TPN provides a daily average of 1415 kCal and 90gm of protein daily, meeting 100% of patient's needs. - Daily MVI and TE  - Check labs in AM, watch K+ closely - Awaiting decision to proceed with comfort measures. F/u Warren, PharmD.  Clinical Pharmacist Pager [phone]

## 2014-07-23 NOTE — Consult Note (Signed)
Patient Katelyn Rojas      DOB: [birth year]      UJW:[id number]     Consult Note from the Palliative Medicine Team at [facility]    Consult Requested by: Dr Joseph Art     PCP: Kirt Boys, MD Reason for Consultation:Clrification of GOC and options     Phone Number:[phone]  Assessment of patients Current state:  Continued physical and functional decline over the past several months, complicated medical conditions.   Chronic respiratory failure/ COPD/ventilator dependant/Trach, PEG for nutritional support/colon perforation/ open abdominal wound/pain, protein calorie malnutrition, sacral decubitis  Consult is for review of medical treatment options, clarification of goals of care and end of life issues, disposition and options, and symptom recommendation.  This NP Lorinda Creed reviewed medical records, received report from team, assessed the patient and then meet at the patient's bedside along with her sons [name]/guardian and [name]/neice,  to discuss diagnosis prognosis, GOC, EOL wishes disposition and options.  RN from [employer] joined meeting for a portion of the time  A detailed discussion was had today regarding advanced directives.  Concepts specific to code status, artifical feeding and hydration, continued IV antibiotics and rehospitalization was had.  The difference between a aggressive medical intervention path  and a palliative comfort care path for this patient at this time was had.  Values and goals of care important to patient and family were attempted to be elicited.  Concept of Hospice and Palliative Care were discussed  Natural trajectory and expectations at EOL were discussed.  Questions and concerns addressed.  Hard Choices booklet left for review. Family encouraged to call with questions or concerns.  PMT will continue to support holistically.   Goals of Care: 1.  Code Status:DNR/DNI   2. Scope of Treatment: 1. Vital Signs: per  unit  2. Respiratory/Oxygen: continue ventilator support 3. Nutritional Support/Tube Feeds: continue TPN 4. Antibiotics: yes 5. IVF: yes 6. Review of Medications to be discontinued: continue present medications 7. Labs: as indicated 8. Telemetry: no once discharge to LTAC   3. Disposition: Family is hopeful to return  to [name].  They verbalize an understanding  that home is not the best option at this time. They continue to remain hopeful for improvement but understand that her long term prognosis is poor. They hope that if she is accepted back to [name] that the focus of care is comfort; continue present level of care but not escalate.  No desire for re hospitalizations.  MOST form completed   4. Symptom Management:   1. Anxiety/Agitation: Ativan 1-2 mg IV every 2 hrs prn 2. Pain: Presently on Morphine IV gtt at 3 mg/hr 3. Weakness/failure to thrive  5. Psychosocial:   Emotional support offered to family.  Questions and concerns were addressed  Patient Documents Completed or Given: Document Given Completed  Advanced Directives Pkt    MOST  yes  DNR    Gone from [facility]    Hard Choices yes     Brief HPI:    73 y.o. F, [name] resident on chronic trach, brought to ED for concerns that PEG tube had perforated her colon. Per reports, PEG has been draining chronically, some of drainage thought to be stool. Surgeon at [name] was concerned that PEG may be transversing colon into stomach. Pt was brought to ED for evaluation of "perforated viscus"; however, upon arrival to [facility] ED, this was determined to not be the case.   Continued physical and functional decline over the  past several months, complicated medical conditions.   Chronic respiratory failure/ COPD/ventilator dependant/Trach, PEG for nutritional support/colon perforation/ open abdominal wound/pain, protein calorie malnutrition, sacral decubitus  Poor response to current medical interventions with minimal  response.  Family faced with advanced directive decisions and anticipatory care needs     ROS: unable to illicit, patient is intubated   PMH:  Past Medical History  Diagnosis Date  . Hypertension   . 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 Arthritis   . Anxiety   . Cachexia 12/2013  . DM type 2 (diabetes mellitus, type 2)   . 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 Anemia 12/2013  . Dementia   . Compression fracture of lumbar vertebra, non-traumatic 12/2013    L3 and L1.      PSH: Past Surgical History  Procedure Laterality Date  . Vaginal hysterectomy      ? Abdominal vs. Vaginal  . Laparotomy N/A 06/26/2014    Procedure: EXPLORATORY LAPAROTOMY;  Surgeon: Emelia Loron, MD;  Location: [facility] OR;  Service: General;  Laterality: N/A;  . Partial colectomy N/A 06/26/2014    Procedure: TRANSVERSE COLECTOMY/COLOSTOMY;  Surgeon: Emelia Loron, MD;  Location: [facility] OR;  Service: General;  Laterality: N/A;  . Gastrostomy N/A 06/26/2014    Procedure: NEW GASTROSTOMY TUBE;  Surgeon: Emelia Loron, MD;  Location: [facility] OR;  Service: General;  Laterality: N/A;  . Peg placement N/A 07/03/2014    Procedure: PERCUTANEOUS ENDOSCOPIC GASTROSTOMY (PEG) PLACEMENT;  Surgeon: Theda Belfast, MD;  Location: [facility] ENDOSCOPY;  Service: Endoscopy;  Laterality: N/A;   I have reviewed the FH and SH and  If appropriate update it with new information. Allergies  Allergen Reactions  . Namenda [Memantine Hcl] Other (See Comments)    "hallucinations"   . Codeine Other (See Comments)    Unknown. Listed on [facility]  . Penicillins Other (See Comments)    Unknown; listed on [facility]   Scheduled Meds: . antiseptic oral rinse  7 mL Mouth Rinse QID  . chlorhexidine  15 mL Mouth Rinse BID  . heparin subcutaneous  5,000 Units Subcutaneous 3 times per day  . insulin aspart  0-15 Units Subcutaneous 6 times per day  . pantoprazole (PROTONIX) IV  40 mg  Intravenous Daily   Continuous Infusions: . sodium chloride 10 mL/hr at 07/23/14 0800  . dextrose Stopped (07/20/14 1724)  . morphine 3 mg/hr (07/23/14 0909)  . Marland KitchenTPN (CLINIMIX-E) Adult 75 mL/hr at 07/23/14 0800  . Marland KitchenTPN (CLINIMIX-E) Adult     PRN Meds:.acetaminophen, albuterol, LORazepam, morphine injection, ondansetron    BP 60/38  Pulse 115  Temp(Src) 98.9 F (37.2 C) (Axillary)  Resp 18  Ht 5\' 1"  (1.549 m)  Wt 58 kg (127 lb 13.9 oz)  BMI 24.17 kg/m2  SpO2 99%   PPS:20 %    Intake/Output Summary (Last 24 hours) at 07/23/14 1325 Last data filed at 07/23/14 8546  Gross per 24 hour  Intake   1653 ml  Output   1100 ml  Net    553 ml   Physical Exam:  General: cachetic , frail HEENT:  Moist buccal membranes, noted temporal/facial muscle wasting Chest:   On ventilator equal bilateral air movement, scattered course BS CVS:  Tachycardic Abdomen: noted abdominal dressing, PEG to straight drainage Ext: +2 edema Neuro:  Open eyes to name, attempts to mouth responses   Labs: CBC    Component Value Date/Time  WBC 8.1 07/20/2014 0450   RBC 2.71* 07/20/2014 0450   HGB 8.2* 07/20/2014 0450   HCT 26.6* 07/20/2014 0450   PLT 229 07/20/2014 0450   MCV 98.2 07/20/2014 0450   MCH 30.3 07/20/2014 0450   MCHC 30.8 07/20/2014 0450   RDW 15.6* 07/20/2014 0450   LYMPHSABS 0.4* 07/20/2014 0450   MONOABS 0.3 07/20/2014 0450   EOSABS 0.1 07/20/2014 0450   BASOSABS 0.0 07/20/2014 0450    BMET    Component Value Date/Time   NA 143 07/23/2014 0500   K 3.7 07/23/2014 0500   CL 101 07/23/2014 0500   CO2 37* 07/23/2014 0500   GLUCOSE 185* 07/23/2014 0500   BUN 31* 07/23/2014 0500   CREATININE 0.36* 07/23/2014 0500   CALCIUM 8.2* 07/23/2014 0500   GFRNONAA >90 07/23/2014 0500   GFRAA >90 07/23/2014 0500    CMP     Component Value Date/Time   NA 143 07/23/2014 0500   K 3.7 07/23/2014 0500   CL 101 07/23/2014 0500   CO2 37* 07/23/2014 0500   GLUCOSE 185* 07/23/2014 0500   BUN 31* 07/23/2014 0500   CREATININE  0.36* 07/23/2014 0500   CALCIUM 8.2* 07/23/2014 0500   PROT 5.1* 07/23/2014 0500   ALBUMIN 1.6* 07/23/2014 0500   AST 25 07/23/2014 0500   ALT 11 07/23/2014 0500   ALKPHOS 180* 07/23/2014 0500   BILITOT 0.4 07/23/2014 0500   GFRNONAA >90 07/23/2014 0500   GFRAA >90 07/23/2014 0500     Time In Time Out Total Time Spent with Patient Total Overall Time  1000 1200 105 min 120 min    Greater than 50%  of this time was spent counseling and coordinating care related to the above assessment and plan.   Lorinda Creed NP  Palliative Medicine Team Team Phone # [phone] Pager [phone]  Discussed with CMRN and [name] representative

## 2014-07-23 NOTE — Progress Notes (Signed)
RT called to room , pt desating, increased fio2 to 40% aftter suctioning. Pt with increased WOB and increased HR. RN notifing MD.

## 2014-07-23 NOTE — Progress Notes (Signed)
Report called to Katelyn Mylar, RN at [facility].  Updated on patient's history, current status, and plan of care.  Patient is stable and able to transfer at this time.  Will continue to monitor until hospital bed is read and [facility] transport services are available.

## 2014-07-23 NOTE — Trach Care Team (Signed)
Trach Care Progression Note   Patient Details Name: Katelyn Rojas MRN: [id number] DOB: 27-Feb-1941 Today's Date: 07/23/2014   Tracheostomy Assessment    Tracheostomy Shiley 6 mm Cuffed (Active)  Status Secured 07/23/2014 12:32 PM  Site Assessment Clean;Dry 07/23/2014 12:32 PM  Site Care Cleansed 07/22/2014 10:35 PM  Inner Cannula Care Changed/new 07/21/2014  5:39 PM  Ties Assessment Dry;Secure;Clean 07/23/2014 12:32 PM  Cuff pressure (cm) 26 cm 07/23/2014 12:32 PM  Emergency Equipment at bedside Yes 07/23/2014 12:32 PM     Care Needs     Respiratory Therapy Tracheostomy: Chronic trach O2 Device: Ventilator FiO2 (%): 40 % SpO2: 99 % Education: Not applicable Respiratory barriers to progression:  (vent dependent)    Speech Language Pathology  SLP chart review complete: Patient does not need SLP services at this time (pt. is chronic trach/vent, came from [name] for PEG complic)   Physical Therapy      Occupational Therapy      Nutritional [name] Current Diet: Total nutrient admixture Tube Feeding: Osmolite 1.2 Cal Tube Feeding Frequency: Other (Comment) (discontinued) Tube Feeding Strength:  (15)    Case Management/Social Work Level of patient care prior to hospitalization: SNF/Assisted  living facility Insurance payer: Medicare Barriers to progression: SNF days remaining- (wound dehiscence/vomiting with restart of tube feeds.) Plan to address barriers:  (possible ethics consult? question futility-) Anticipated discharge disposition: SNF/Assisted  living facility    Provider [place] Care Team Recommendations [facility] Team Members -  Harlon Ditty, SLP, Gretta Cool, SW,  & Tammie Readling, RT    Possible ethics consult??            Katelyn Rojas, Katelyn Rojas (scribe for team) 07/23/2014, 2:33 PM

## 2014-07-23 NOTE — Discharge Summary (Signed)
Physician Discharge Summary  Katelyn Rojas TUU:[id number] DOB: 07-Jul-1941 DOA: 07/01/2014  PCP: Gildardo Cranker, MD  Admit date: 07/01/2014 Discharge date: 07/23/2014  Time spent: 30 minutes  Recommendations for Outpatient Follow-up:  1. Discharge to [facility]   Discharge Diagnoses:  Active Problems:    Chronic respiratory failure with hypercarbia and hypoxia/Trach:COPD   Dependent on ventilator   Chronic hypotension-resolved   Type II DM   Colon perforation/PEG tube malfunction-replaced   Open wound of abdomen with non functioning GI tract/intolerant to enteral feeds   Anemia, chronic disease   Hypokalemia   Protein-calorie malnutrition, severe on TNA   Chronic dementia   Sacral decubitus ulcer, stage III   Edema of right upper extremity   End of life issues   Discharge Condition: Guarded  Diet recommendation: TNA but given dire prognosis recommend withdraw care including TNA  Filed Weights   07/21/14 0600 07/22/14 0500 07/23/14 0436  Weight: 126 lb 15.8 oz (57.6 kg) 128 lb 4.9 oz (58.2 kg) 127 lb 13.9 oz (58 kg)    History of present illness:  73 year old with a PMHx PEA arrest. Because of underlying severe COPD she has become chronically dependent on the ventilator. At some point she had a PEG tube placed by interventional radiologist and was discharged to an [facility]. On 06/17/2014 she was admitted from the LTAC due to concerns of stool draining from the PEG tube. After evaluation it was proven she had a trans-colonic gastrostomy tube. She later underwent extensive surgical repair that included creation of right colostomy and placement of a new open gastrostomy tube and was subsequently discharged back to [facility] on 06/30/2014. She also has a history of MRSA and enterococcus bacteremia and prior tracheitis with cultures positive for Alcaligenes bacteria.   She was sent to [facility] again on 07/01/2014 due to concerns of dislodgment of the recently placed  gastrostomy tube. Prior to presentation to the emergency department there were several attempts to replace the tube that were unsuccessful. In the emergency department the previous tube was completely removed by the surgeon and a new tube was placed. Placement was checked via CT scan and unfortunately the replaced tube was not in proper position and it also had to be removed. Since the stomach was sutured to the anterior abdominal wall it was felt that endoscopic placement of feeding tube would be safest approach. Gastroenterology was consulted and on 07/03/2014 patient underwent successful PEG tube placement. In the interim the patient's prior large abdom wound dehisced and it was determined that closing the wound in the OR could not be safely accomplished therefore the wound was covered with an Eakin's pouch. Attempts had been made to initiate tube feedings but patient experienced nausea and vomiting so the gastric tube was placed to gravity. Overall prognosis poor and the critical care physician had an extensive conversation with the patient's family on 07/08/2014. They did agree to a DO NOT RESUSCITATE but otherwise planned to continue aggressive care.   Subsequently trickle tube feedings were resumed with the addition of Reglan on 8/27. Unfortunately she did not tolerate resumption of tube feedings with noted increased leaking of tube feeding around the insertion site. After evaluation by the surgical team it was determined that the tube feedings need to remain off at this point. On 8/28 the patient was c/o increased severity of her abdominal pain and exam revealed the patient's wound had dehisced further such that the two separate wounds had become one large wound,  with bowel visible just below a minimal layer of granulation tissue. Dr. Sherral Hammers met with the patient's family on 07/11/2014 to discuss patient's prognosis and to review goals of care.   9/8 Medical staff met again with all family memebers  including son from [place]. Desire expressed to take pt home on vent although based on additional conversations with our case manager family appears to not understand that eventual outcome will be terminal vent wean. 9/9 Palliative Medicine has been consulted to assist and to help with the Hospice portion of discharge planning. After a two hour meeting with the family it was determined that the family is not yet ready to pursue comfort measures for this patient. They agree to continue DNR status and IV pain meds. They are not ready to stop TNA or terminal wean ventilator. Plan is to dc to [facility]. Family had requested to take patient home on the ventilator but was refused by two home health agencies.   Hospital Course:  Chronic respiratory failure/Dependent on ventilator/Trach/ severe COPD  Once family is ready, a terminal vent wean would be most appropriate. Vent settings: VT 8cc/kg, Fio2 40%, Mode PRVC, Rate 14, PEEP 5.  Chronic hypotension  BP stable at her baseline off solu-cortef  Palliative care meeting  8/29 Dr. Sherral Hammers met with Katelyn Rojas (niece), Katelyn Rojas (son), and Katelyn Rojas (son/on the phone) - the family decided they do not want to escalate care, however they would like to continue current level of treatment - hopefully this would give Katelyn Rojas (son) time to travel from [place] to be at mothers bedside, at which time care will be transitioned to full comfort only-9/8: Katelyn Rojas he arrived today from [place], also in room was patient's niece Katelyn Rojas and Ronnie's wife. Dr. Sherral Hammers had an extensive conversation with them regarding expectations of end-of-life care. They have verbalized they would like to take [name] home on the ventilator.9/9 decided to ask Palliative Medicine to see pt/family unfortunately two home health agencies refused to take on this patient. 9/10 2 hour meeting with family and wish to continue care as is and have deferred terminal vent weaning at this time. They did agree to  transfer to [facility] and a MOST form will be completed prior to dc.  PEG tube malfunction / Colon perforation / Open wound of abdomen  surgical team had a discussion with the patient's niece on 07/13/2014 and frankly explained that TNA is not a long-term option - there are also no options for enteral feeding unfortunately- 9/8 MD spoke at great length with family (Katelyn Rojas, [place], and Ronnie's wife). Counseled family that the pt's current abdom wound/situation is a terminal condition.   Edema of right upper extremity  PICC in same arm - venous duplex no DVT   Type II DM  Experienced some hypoglycemia so the insulin was removed from the TNA   Anemia, chronic disease  Hemoglobin has been stable around 8-8.5   Hypokalemia  Replete via TNA and when necessary IV boluses   Protein-calorie malnutrition, severe  Did not tolerate trickle tube feedings - continue TNA short term, but long term use would be inhumane -the discharge navigator does not allow [place] to order TNA at discharge but this will continue at [name].  Chronic dementia  -she has told multiple providers that she does not wish to live "this way"-on 9/8 in the presence of RN Katelyn Rojas and daughter-in-law patient stated she wished they would allow her to die   Sacral decubitus ulcer, stage III  Continue  treatment per surgery recs   Chronic pain  -Secondary to dehiscence of abdominal incision, and lack of functioning of GI tract-[name] family would like to investigate taking patient home.-Family understands Pt's Comfort needs will best be served through at [facility] care at [place] for ventilator management  Gastroenterology  Discharge Exam: Filed Vitals:   07/23/14 1232  BP: 60/38  Pulse: 115  Temp:   Resp: 18   Gen: No current respiratory distress but had episode of hypoxia and tachypnea earlier this am with associated diaphoresis prompting [place] to  increase frequency of IV Ativan and increase IV morphine drip to 3 mg/hr Resp: Course crackles th/o all fields -remains ventilator dependent  Adbom: Large open wound dressed and covered for the most part with a very large Eakin's pouch-PEG tube to straight drain -TNA infusing Ext: 2+ diffuse edema/anasarca which has not improved   Discharge Instructions You were cared for by a hospitalist during your hospital stay. If you have any questions about your discharge medications or the care you received while you were in the hospital after you are discharged, you can call the unit and asked to speak with the hospitalist on call if the hospitalist that took care of you is not available. Once you are discharged, your primary care physician will handle any further medical issues. Please note that NO REFILLS for any discharge medications will be authorized once you are discharged, as it is imperative that you return to your primary care physician (or establish a relationship with a primary care physician if you do not have one) for your aftercare needs so that they can reassess your need for medications and monitor your lab values.  Discharge Instructions   Bed rest    Complete by:  As directed      Diet general    Complete by:  As directed   NPO -TNA ONLY-GUT NONFUNCTIONAL          Current Discharge Medication List    START taking these medications   Details  acetaminophen (TYLENOL) 650 MG suppository Place 1 suppository (650 mg total) rectally every 6 (six) hours as needed for fever or mild pain. Qty: 12 suppository, Refills: 0    albuterol (PROVENTIL) (2.5 MG/3ML) 0.083% nebulizer solution Take 3 mLs (2.5 mg total) by nebulization every 3 (three) hours as needed for wheezing or shortness of breath. Qty: 75 mL, Refills: 12    antiseptic oral rinse (CPC / CETYLPYRIDINIUM CHLORIDE 0.05%) 0.05 % LIQD solution 7 mLs by Mouth Rinse route QID. Refills: 0    heparin 5000 UNIT/ML injection Inject 1 mL  (5,000 Units total) into the skin every 8 (eight) hours. Qty: 1 mL    morphine 100 mg in dextrose 5 % 100 mL Inject 3 mg/hr into the vein continuous.    ondansetron (ZOFRAN) 4 MG/2ML SOLN injection Inject 2 mLs (4 mg total) into the vein every 8 (eight) hours as needed for nausea or vomiting. Qty: 2 mL, Refills: 0    sodium chloride 0.9 % infusion Inject 10 mLs into the vein continuous. To keep vein open for meds Qty: 250 mL, Refills: 0      CONTINUE these medications which have CHANGED   Details  chlorhexidine (PERIDEX) 0.12 % solution 15 mLs by Mouth Rinse route 2 (two) times daily. Qty: 120 mL, Refills: 0    insulin aspart (NOVOLOG) 100 UNIT/ML injection USE FACILITY SLIDING SCALE INSULIN Qty: 10 mL,  Refills: 11    LORazepam (ATIVAN) 2 MG/ML injection Inject 0.5-1 mLs (1-2 mg total) into the vein every 2 (two) hours as needed for anxiety or sedation. Qty: 1 mL, Refills: 0    morphine 2 MG/ML injection Inject 1-2 mLs (2-4 mg total) into the vein every hour as needed. Qty: 1 mL, Refills: 0    pantoprazole (PROTONIX) 40 MG injection Inject 40 mg into the vein daily. Qty: 1 each      STOP taking these medications     albuterol (PROVENTIL HFA;VENTOLIN HFA) 108 (90 BASE) MCG/ACT inhaler      Amino Acids-Protein Hydrolys (FEEDING SUPPLEMENT, PRO-STAT SUGAR FREE 64,) LIQD      ciprofloxacin (CIPRO) 400 MG/40ML SOLN injection      dextrose 10 % infusion      hydrocortisone sodium succinate (SOLU-CORTEF) 100 MG SOLR injection      metroNIDAZOLE (FLAGYL) 50 mg/ml oral suspension      Nutritional Supplements (FEEDING SUPPLEMENT, OSMOLITE 1.2 CAL,) LIQD      vancomycin (VANCOCIN) 1 GM/200ML SOLN      acetaminophen (TYLENOL) 325 MG tablet      citalopram (CELEXA) 20 MG tablet      clonazePAM (KLONOPIN) 0.25 MG disintegrating tablet      donepezil (ARICEPT) 10 MG tablet      zinc sulfate 220 MG capsule        Allergies  Allergen Reactions  . Namenda [Memantine Hcl]  Other (See Comments)    "hallucinations"   . Codeine Other (See Comments)    Unknown. Listed on [facility]  . Penicillins Other (See Comments)    Unknown; listed on [facility]      The results of significant diagnostics from this hospitalization (including imaging, microbiology, ancillary and laboratory) are listed below for reference.    Significant Diagnostic Studies: Ct Abdomen Pelvis W Contrast  07/02/2014   CLINICAL DATA:  Possible infection. " "GI problem"  EXAM: CT ABDOMEN AND PELVIS WITH CONTRAST  TECHNIQUE: Multidetector CT imaging of the abdomen and pelvis was performed using the standard protocol following bolus administration of intravenous contrast.  CONTRAST:  131m OMNIPAQUE IOHEXOL 300 MG/ML  SOLN  COMPARISON:  05/08/2014  FINDINGS: BODY WALL: There is anasarca.  P  LOWER CHEST: Small layering pleural effusions and bibasilar atelectasis. Centrilobular emphysema.  ABDOMEN/PELVIS:  Liver: Essentially negative.  Biliary: No evidence of biliary obstruction or stone.  Pancreas: Unchanged mild enlargement of the main duct. No visible mass, inflammation, or calculus.  Spleen: Unremarkable.  Adrenals: Unremarkable.  Kidneys and ureters: 4.3 cm right renal cyst, herniating into the pelvis. No hydronephrosis.  Bladder: Decompressed around a Foley catheter.  Reproductive: There may have been hysterectomy.  No adnexal mass.  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 Contrast was injected through the nasogastric tube, which continued through small bowel and colon into the right abdomen colostomy. The colostomy is edematous status post recent surgery. There are loops of small bowel in the left abdomen which are dilated and fluid-filled, but do not contain positive oral contrast. These are presumably in continuity with the contrast containing segments given the surgical  history. Extraluminal more dense contrast is seen in the left abdomen and possibly in the pelvis. Reportedly there was injection of barium for tube placement confirmation at outside hospital. The recently placed percutaneous gastrostomy tube is extra luminal, tip in the left abdomen. There is  no extravasation of the more low-density positive oral contrast to suggest active leak. Pneumoperitoneum is present in the upper abdomen. Some retained barium present within the rectum.  OSSEOUS: No acute abnormalities.  IMPRESSION: 1. Extra luminal percutaneous gastrostomy tube after replacement. Pneumoperitoneum and extraluminal barium likely related to prior attempts to replace the catheter. No extravasation of contrast administered via the nasogastric tube. 2. Dilated small bowel loops with fluid levels and angulation consistent with partial obstruction. Contrast via nasogastric tube does reach the right abdominal transverse colostomy.   Electronically Signed   By: Jorje Guild M.D.   On: 07/02/2014 01:09   Dg Chest Portable 1 View  07/01/2014   CLINICAL DATA:  GI problem.  COPD.  Diabetes.  Dementia.  EXAM: PORTABLE CHEST - 1 VIEW  COMPARISON:  06/23/2014  FINDINGS: Moderate patient rotation to the right. A right-sided PICC line terminates at the low SVC. Tracheostomy is grossly normal in position.  Cardiomegaly. Suspect a small right pleural effusion. No pneumothorax. No gross congestive failure. Improved bibasilar aeration. There may be mild left base atelectasis remaining.  Left upper quadrant gas could be within small bowel loops or the stomach.  IMPRESSION: Moderately position degraded exam. Possible trace right pleural fluid and mild left base atelectasis. No gross acute disease. Overall improved aeration since 06/23/2014.   Electronically Signed   By: Abigail Miyamoto M.D.   On: 07/01/2014 19:25   Dg Abd Portable 1v  07/08/2014   CLINICAL DATA:  Nausea.  Poor p.o. intake.  EXAM: PORTABLE ABDOMEN - 1 VIEW   COMPARISON:  CT scan from 07/01/2014.  X-ray from 07/01/2014.  FINDINGS: Supine view of the abdomen shows gastrostomy tube overlying the midline abdomen. Gaseous bowel distension is similar to slightly decreased when comparing to the scout film from the previous CT scan and the previous abdominal x-ray. Contrast material from the previous studies is now visualized in a nondilated colon. Mild collapse/consolidation seen at the left base with associated small left pleural effusion.  IMPRESSION: Mild gaseous small bowel distension, slightly improved in the interval since the prior studies.   Electronically Signed   By: Misty Stanley M.D.   On: 07/08/2014 13:32   Dg Abd Portable 1v  07/01/2014   CLINICAL DATA:  Abdominal pain.  EXAM: PORTABLE ABDOMEN - 1 VIEW  COMPARISON:  06/17/2014  FINDINGS: There is some scattered residual contrast in the colon. Air-filled small bowel loops are noted. Could not exclude a small bowel obstruction. The NG tube tip is in the antral region of the stomach.  IMPRESSION: NG tube is in the stomach.  Dilated air-filled small bowel loops, suggesting small bowel obstruction.   Electronically Signed   By: Kalman Jewels M.D.   On: 07/01/2014 21:04    Microbiology: No results found for this or any previous visit (from the past 240 hour(s)).   Labs: Basic Metabolic Panel:  Recent Labs Lab 07/19/14 0810 07/20/14 0450 07/21/14 0500 07/22/14 0500 07/23/14 0500  NA 145 143 144 144 143  K 4.0 4.0 3.7 3.9 3.7  CL 107 105 103 104 101  CO2 32 32 34* 34* 37*  GLUCOSE 75 120* 87 176* 185*  BUN 33* 34* 32* 32* 31*  CREATININE 0.43* 0.40* 0.39* 0.38* 0.36*  CALCIUM 7.9* 7.8* 7.9* 8.1* 8.2*  MG  --  1.8  --   --  1.7  PHOS  --  3.3  --   --  3.2   Liver Function Tests:  Recent Labs Lab 07/20/14  0450 07/23/14 0500  AST 19 25  ALT 9 11  ALKPHOS 99 180*  BILITOT 0.9 0.4  PROT 4.4* 5.1*  ALBUMIN 1.7* 1.6*   No results found for this basename: LIPASE, AMYLASE,  in the  last 168 hours No results found for this basename: AMMONIA,  in the last 168 hours CBC:  Recent Labs Lab 07/20/14 0450  WBC 8.1  NEUTROABS 7.4  HGB 8.2*  HCT 26.6*  MCV 98.2  PLT 229   Cardiac Enzymes: No results found for this basename: CKTOTAL, CKMB, CKMBINDEX, TROPONINI,  in the last 168 hours BNP: BNP (last 3 results)  Recent Labs  12/26/13 1130 01/31/14 1540 02/03/14 1535  PROBNP 130.4* 155.9* 129.5*   CBG:  Recent Labs Lab 07/22/14 1542 07/22/14 1957 07/23/14 0004 07/23/14 0424 07/23/14 0903  GLUCAP 93 154* 126* 155* 97       Signed:  ELLIS,ALLISON L. ANP [facility] 07/23/2014, 12:46 PM  Examined Pt and discussed A/P with ANP Ebony Hail and NCM Henrietta at length and agree with above plan.  Pt with Multiple complex Medical problems> 40 min spent in direct Pt care.

## 2014-07-23 NOTE — Progress Notes (Signed)
[facility] staff here to transport patient to [name].  Son, [name], called and updated.

## 2014-07-28 NOTE — Discharge Summary (Signed)
Patient seen and examined, agree with above note.  I dictated the care and orders written for this patient under my direction.  Tacara Hadlock G Idamae Coccia, MD [phone] 

## 2014-08-04 DIAGNOSIS — Z7189 Other specified counseling: Secondary | ICD-10-CM

## 2014-08-04 DIAGNOSIS — R0600 Dyspnea, unspecified: Secondary | ICD-10-CM

## 2014-09-13 DEATH — deceased

## 2015-02-16 IMAGING — CR DG CHEST 1V PORT
1 series · 1 of 1 positions shown · non-contrast
Comparison: 04/01/2014, 03/30/2014, and earlier priors

CLINICAL DATA: CHF

EXAM:
PORTABLE CHEST - 1 VIEW

[portable]
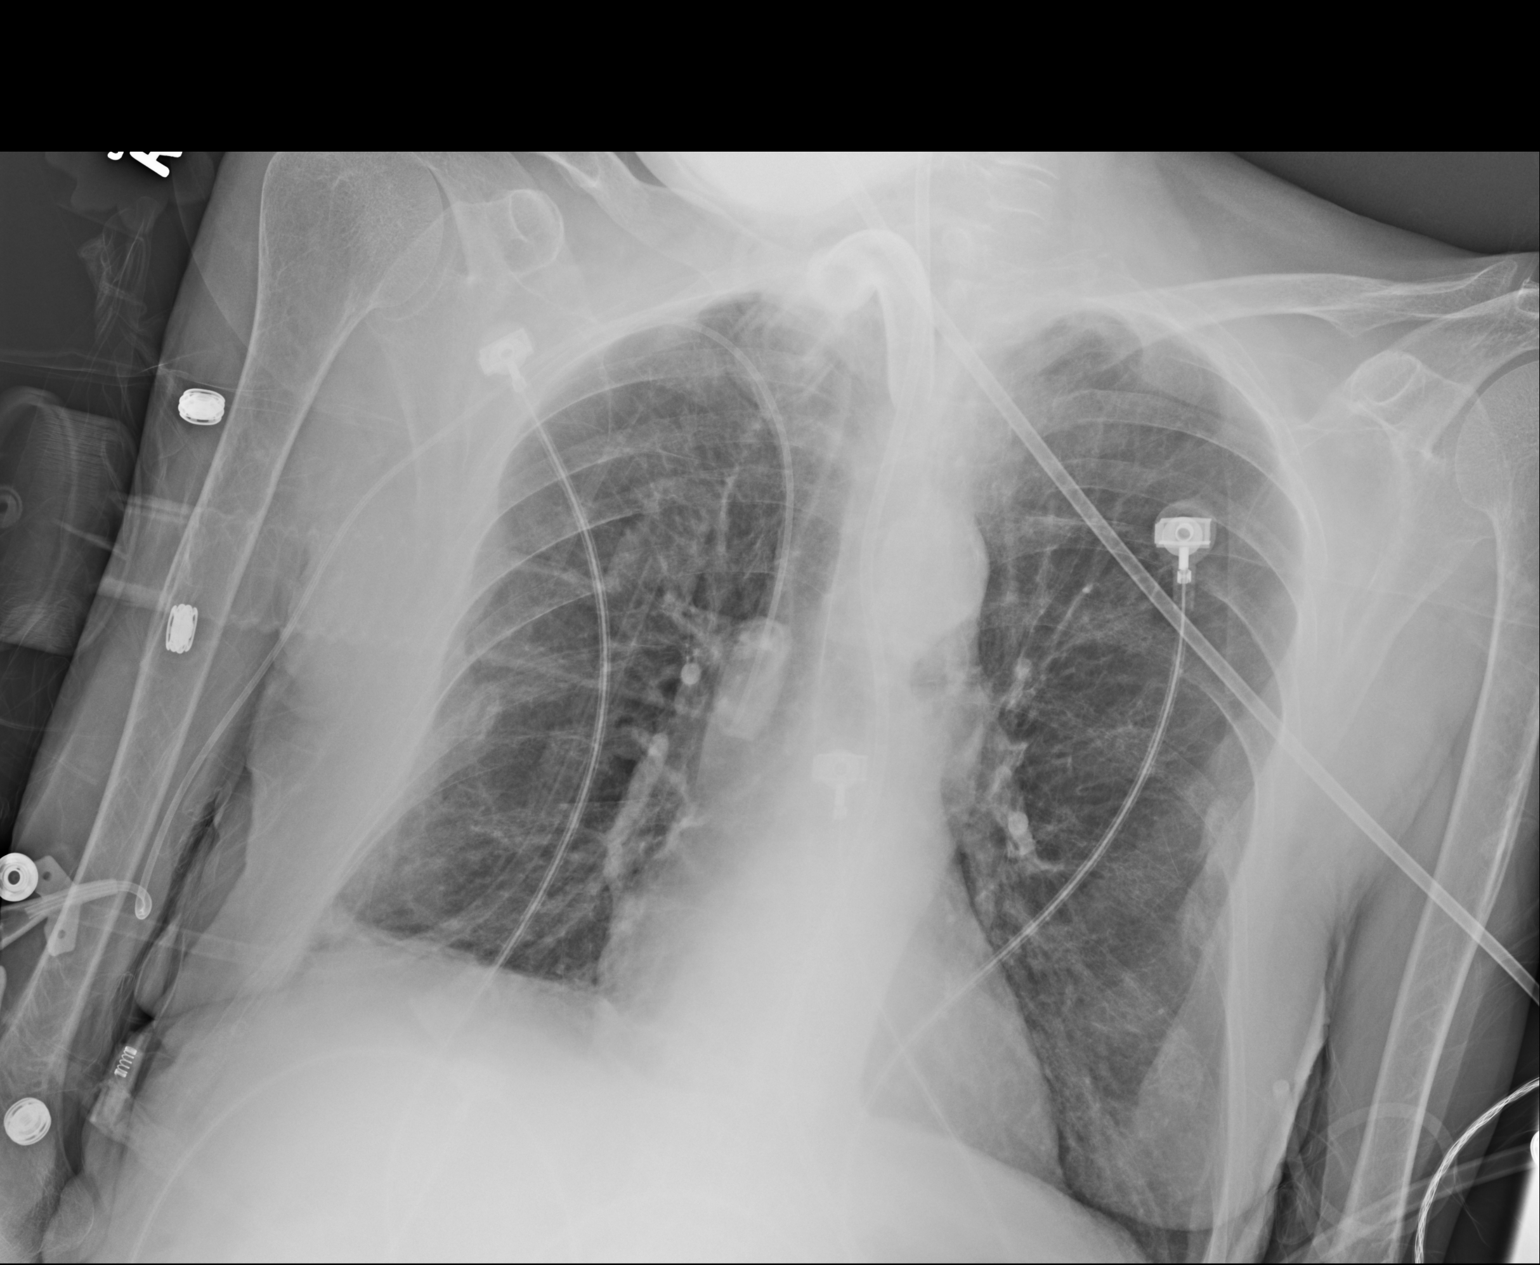

[1 of 1 positions shown; findings below may reference images not displayed]

FINDINGS: Tracheostomy tube, feeding tube, and right upper extremity PICC
appear unchanged. Distal aspect of the feeding tube not included on
image. Heart and mediastinal contours are normal. There is streaky
atelectasis at the right lung base. No airspace disease identified.
No visible pneumothorax or pleural effusion. Possible callus
formation about the left eighth rib. Question healing rib fracture.
Possible callus formation about the right seventh rib. Question
healing rib fracture. The bones diffusely osteopenic.
IMPRESSION: Significant improvement in aeration the lungs compared to chest
radiograph of 03/30/2014. The chest radiograph of 04/01/2014 was
markedly limited by overexposure. Probable bilateral healing rib
fractures.

## 2015-02-17 IMAGING — CR DG CHEST 1V PORT
1 series · 1 of 1 positions shown · non-contrast
Comparison: Chest radiograph performed 04/04/2014

CLINICAL DATA: Status post code and removal of abnormally
positioned enteric tube. Assess for pneumothorax.

EXAM:
PORTABLE CHEST - 1 VIEW

[AP]
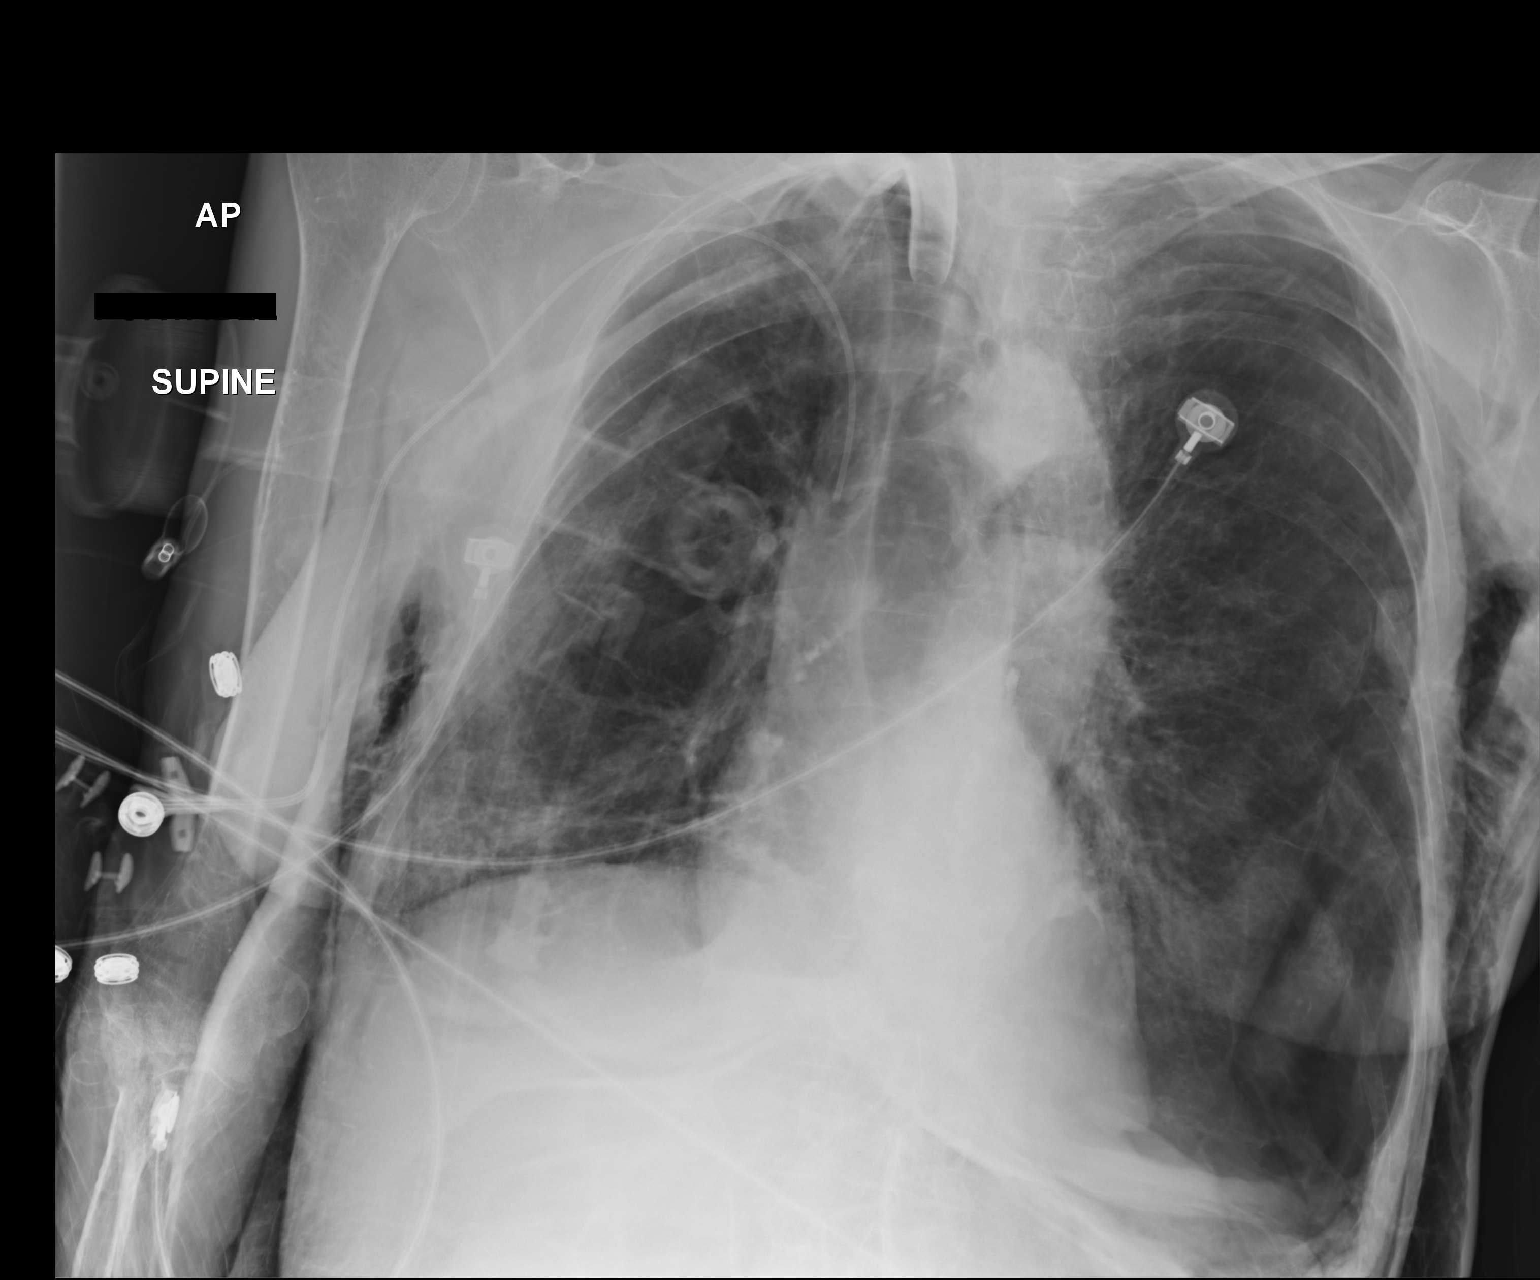

[1 of 1 positions shown; findings below may reference images not displayed]

FINDINGS: There is a moderate to large left-sided pneumothorax, measuring
perhaps 40% of left lung volume, new from the prior study.

There may be slight rightward mediastinal shift, raising question
for some degree of tension. Mild right basilar opacity may reflect
atelectasis. The right lung is otherwise clear. No pleural effusion
is identified.

The cardiomediastinal silhouette remains normal in size. No acute
osseous abnormalities are seen. Note is made of significant soft
tissue air on both sides of the chest wall, worse on the left, new
from the prior study. Supraclavicular air is also noted on the left
side. This may reflect leakage of air from the moderate to large
left-sided pneumothorax.
IMPRESSION: 1. New moderate to large left-sided pneumothorax, measuring perhaps
40% of left lung volume.
2. Question of slight rightward mediastinal shift, raising concern
for some degree of tension pneumothorax.
3. Mild right basilar airspace opacity may reflect atelectasis.
4. Significant soft tissue air on both sides of the chest wall,
worse on the left, with supraclavicular air noted on the left side.
This is also new, and may reflect leakage of air from the moderate
to large left-sided pneumothorax.

Critical Value/emergent results were called by telephone at the time
of interpretation on 04/05/2014 at [DATE] to Dr. Dinesh Chandra, who verbally
acknowledged these results.

## 2015-03-22 IMAGING — CT CT ABD-PELV W/ CM
2 of 5 series · 16 of 46 positions shown, 18 images · IV contrast (Omni 300)
Comparison: 02/01/2014.

CLINICAL DATA: Evaluate for fistula.

EXAM:
CT ABDOMEN AND PELVIS WITH CONTRAST
TECHNIQUE: Multidetector CT imaging of the abdomen and pelvis was performed
using the standard protocol following bolus administration of
intravenous contrast.
CONTRAST:  70mL OMNIPAQUE IOHEXOL 300 MG/ML  SOLN

[Series 2: abd/ pelvis 5.0 i30f 1 · axial · 0.79mm/px · z∈[+1139,+1469]mm · 13 of 76 slices shown, 15 images]
[im 5/76  soft-tissue]
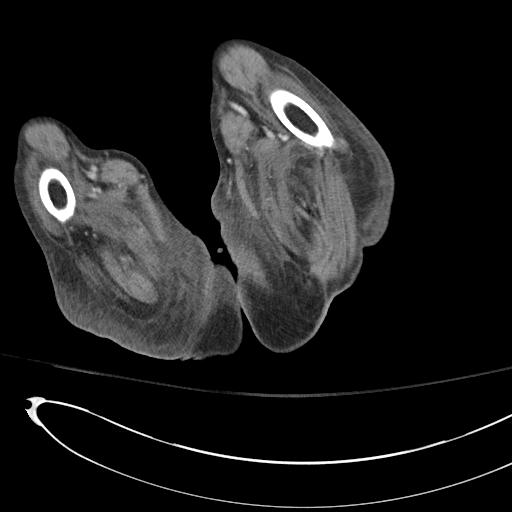
[im 5/76  bone]
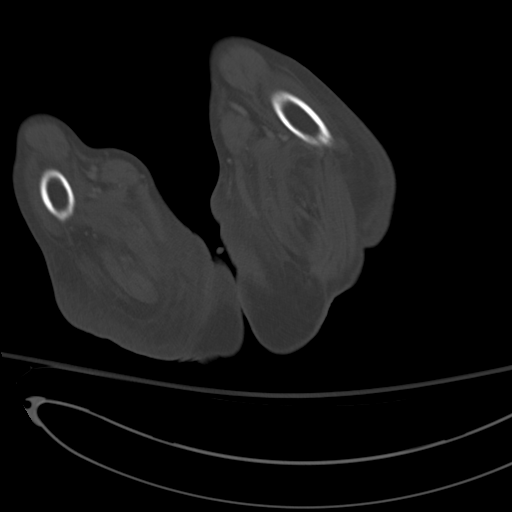
[im 9/76  soft-tissue]
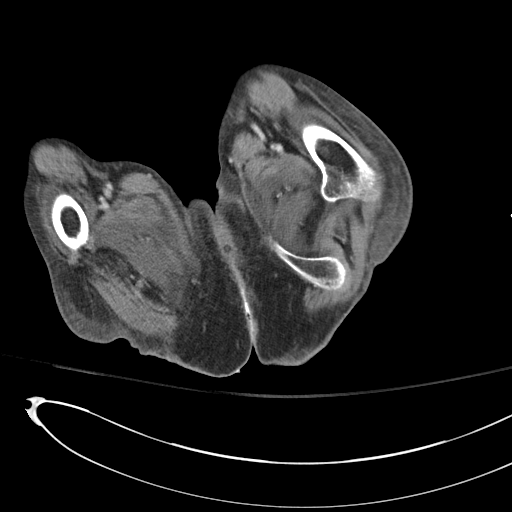
[im 17/76  soft-tissue]
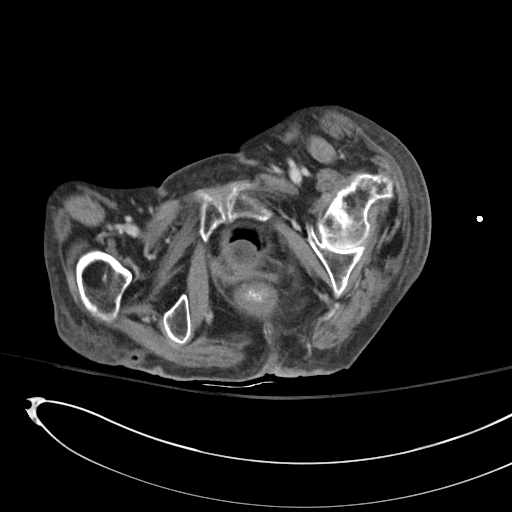
[im 21/76  soft-tissue]
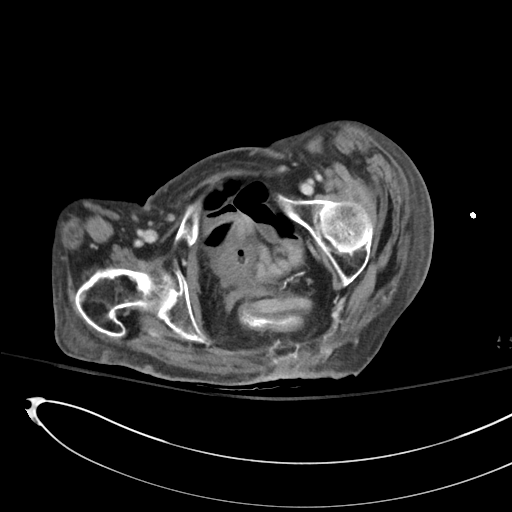
[im 26/76  soft-tissue]
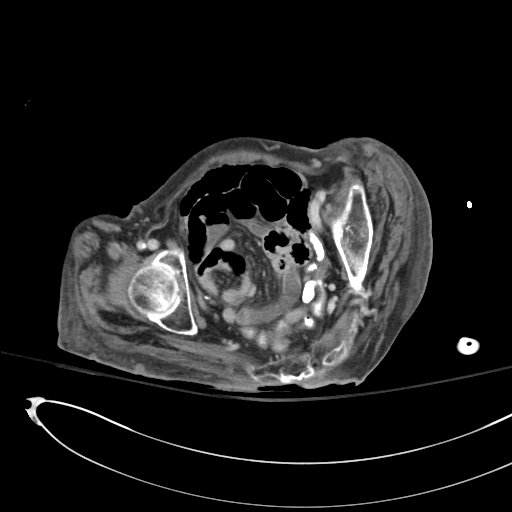
[im 34/76  soft-tissue]
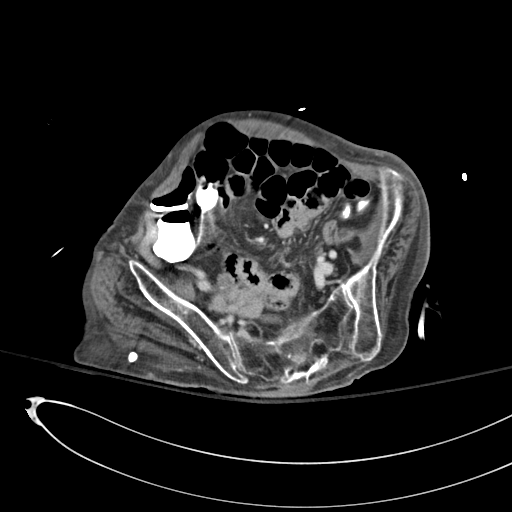
[im 38/76  soft-tissue]
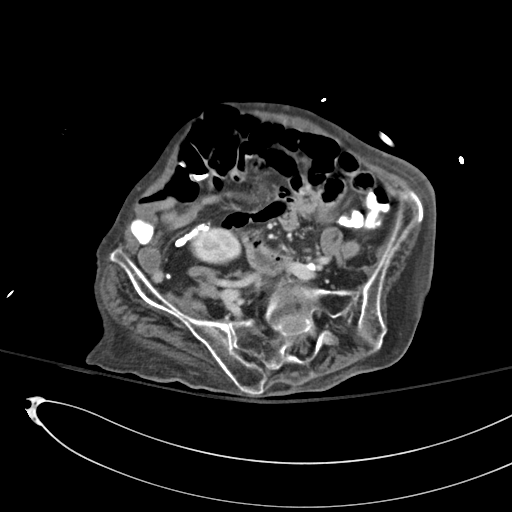
[im 42/76  soft-tissue]
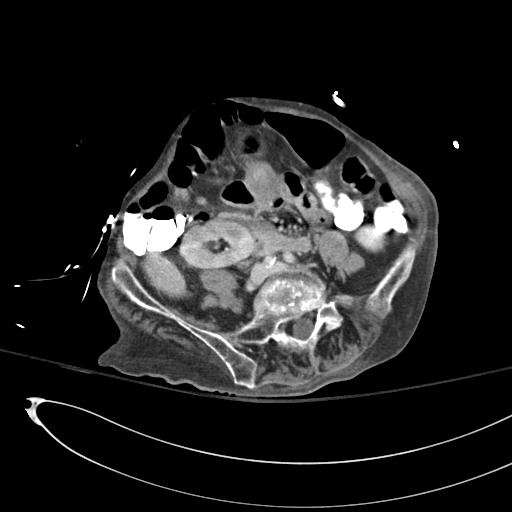
[im 51/76  soft-tissue]
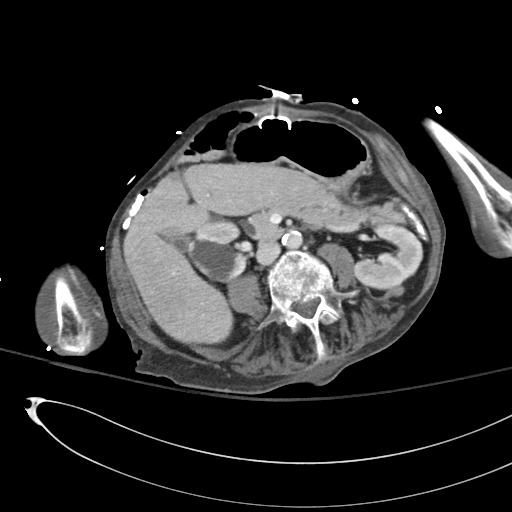
[im 51/76  bone]
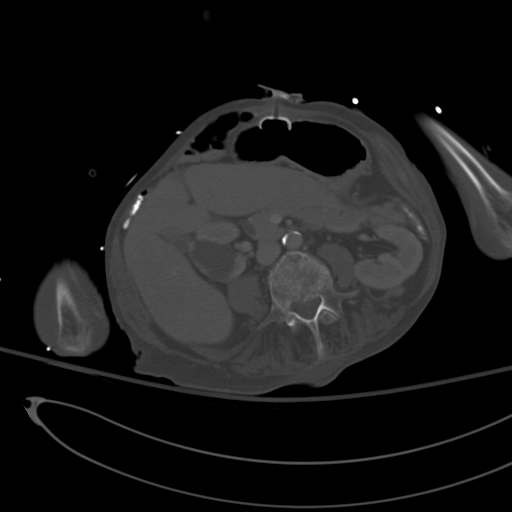
[im 55/76  soft-tissue]
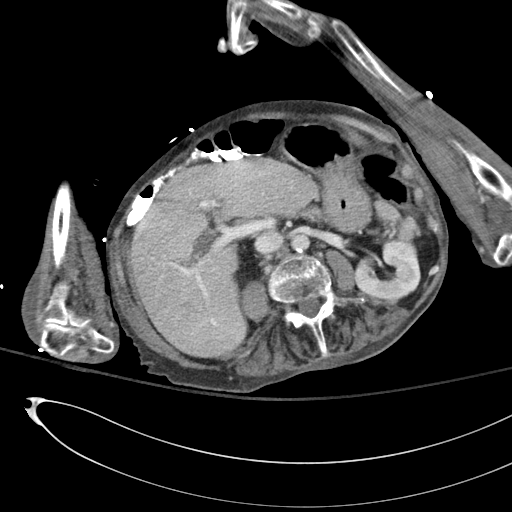
[im 59/76  soft-tissue]
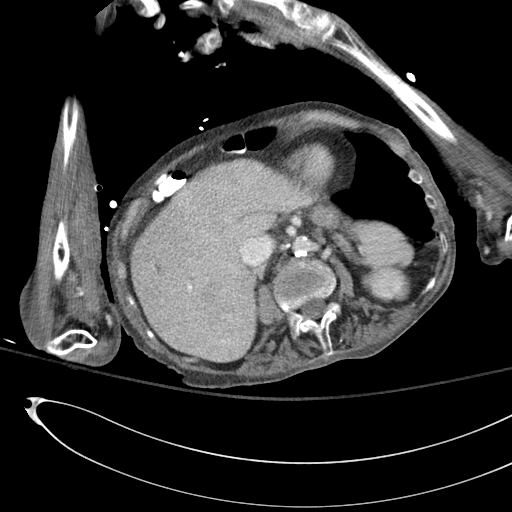
[im 67/76  soft-tissue]
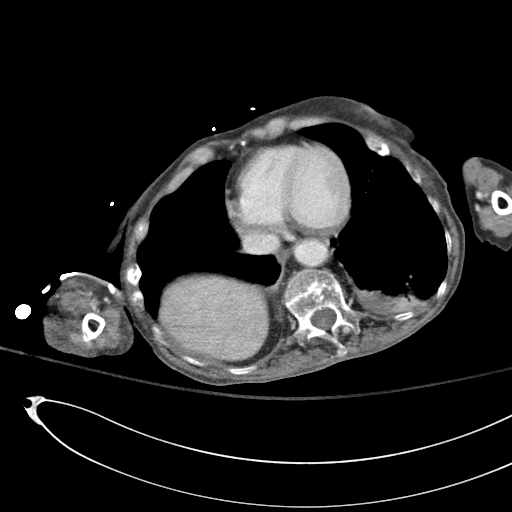
[im 71/76  soft-tissue]
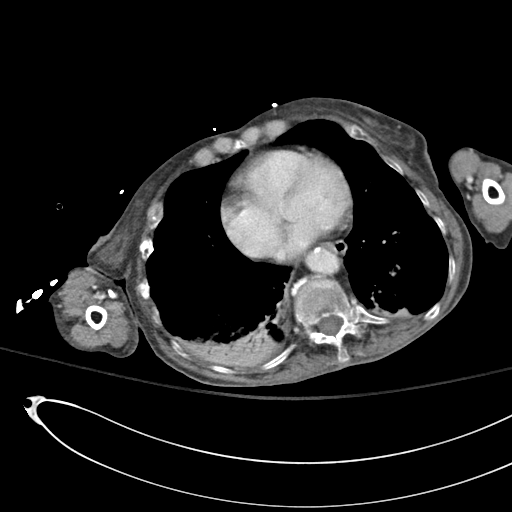

[Series 4: coronals · coronal · 0.70mm/px · 3 of 115 slices shown]
[im 39/115  soft-tissue]
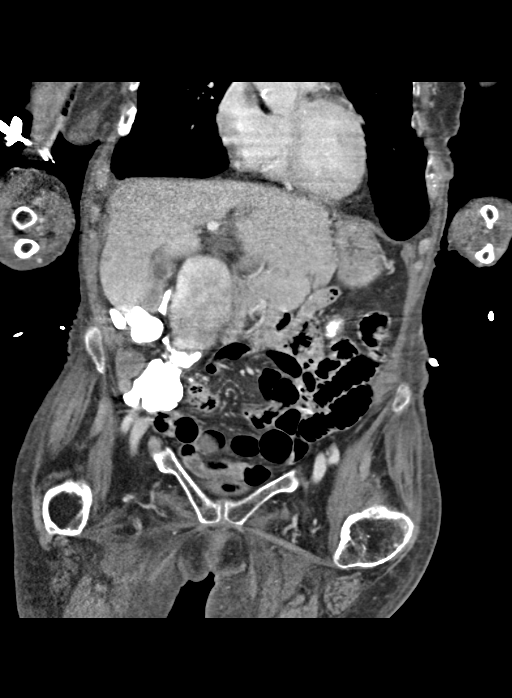
[im 51/115  soft-tissue]
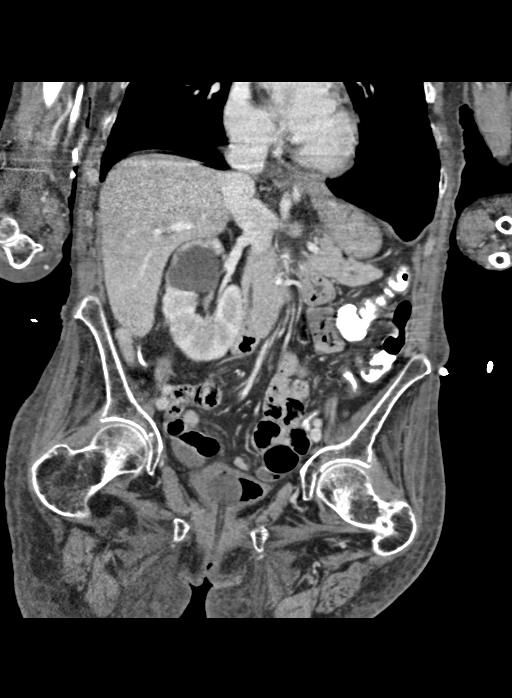
[im 64/115  soft-tissue]
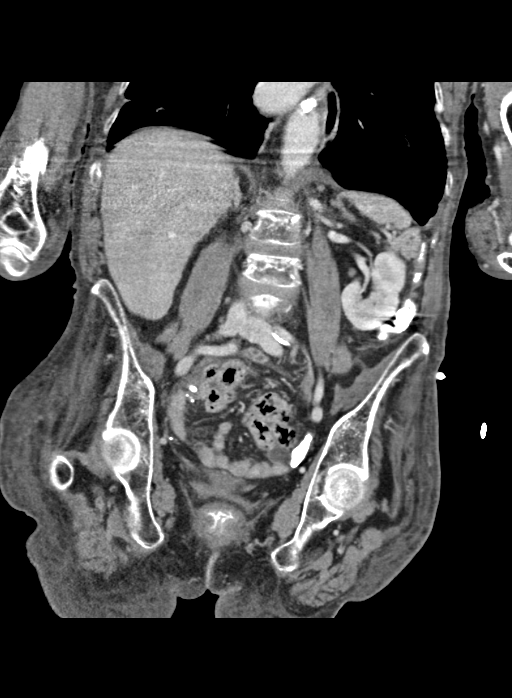

[16 of 46 positions shown; findings below may reference images not displayed]

FINDINGS: Lung bases show subpleural consolidation in the lower lobes
bilaterally. Emphysema. Heart size normal. No pericardial or pleural
effusion.

Liver, gallbladder and adrenal glands are unremarkable. A 4.3 cm
low-attenuation lesion in the right kidney is seen with associated
scarring and likely represents a cyst. 5 mm low-attenuation lesion
off the lower pole right kidney is also likely a cyst. Spleen and
pancreas are unremarkable. Percutaneous gastrostomy. Small bowel and
colon are unremarkable. Foley catheter and air are seen in the
bladder. Trace pelvic free fluid.

There is a sacral decubitus ulcer. Atherosclerotic calcification of
the arterial vasculature without abdominal aortic aneurysm. No
pathologically enlarged lymph nodes. No worrisome lytic or sclerotic
lesions. Degenerative changes are seen in the spine. Old rib
fractures. Compression deformities now involve all lumbar vertebral
bodies, progressive from 02/01/2014.
IMPRESSION: 1. No acute findings.  No definitive evidence of a fistula.
2. Mild subpleural airspace consolidation in the lower lobes.
3. Sacral decubitus ulcer.
4. Trace pelvic free fluid.
5. L1 through L5 compression fractures, progressive from 02/01/2014.

## 2015-05-22 IMAGING — CR DG ABD PORTABLE 1V
2 series · 2 of 2 positions shown · non-contrast
Comparison: CT scan from 07/01/2014.  X-ray from 07/01/2014.

CLINICAL DATA: Nausea.  Poor p.o. intake.

EXAM:
PORTABLE ABDOMEN - 1 VIEW

[AP (1 of 2)]
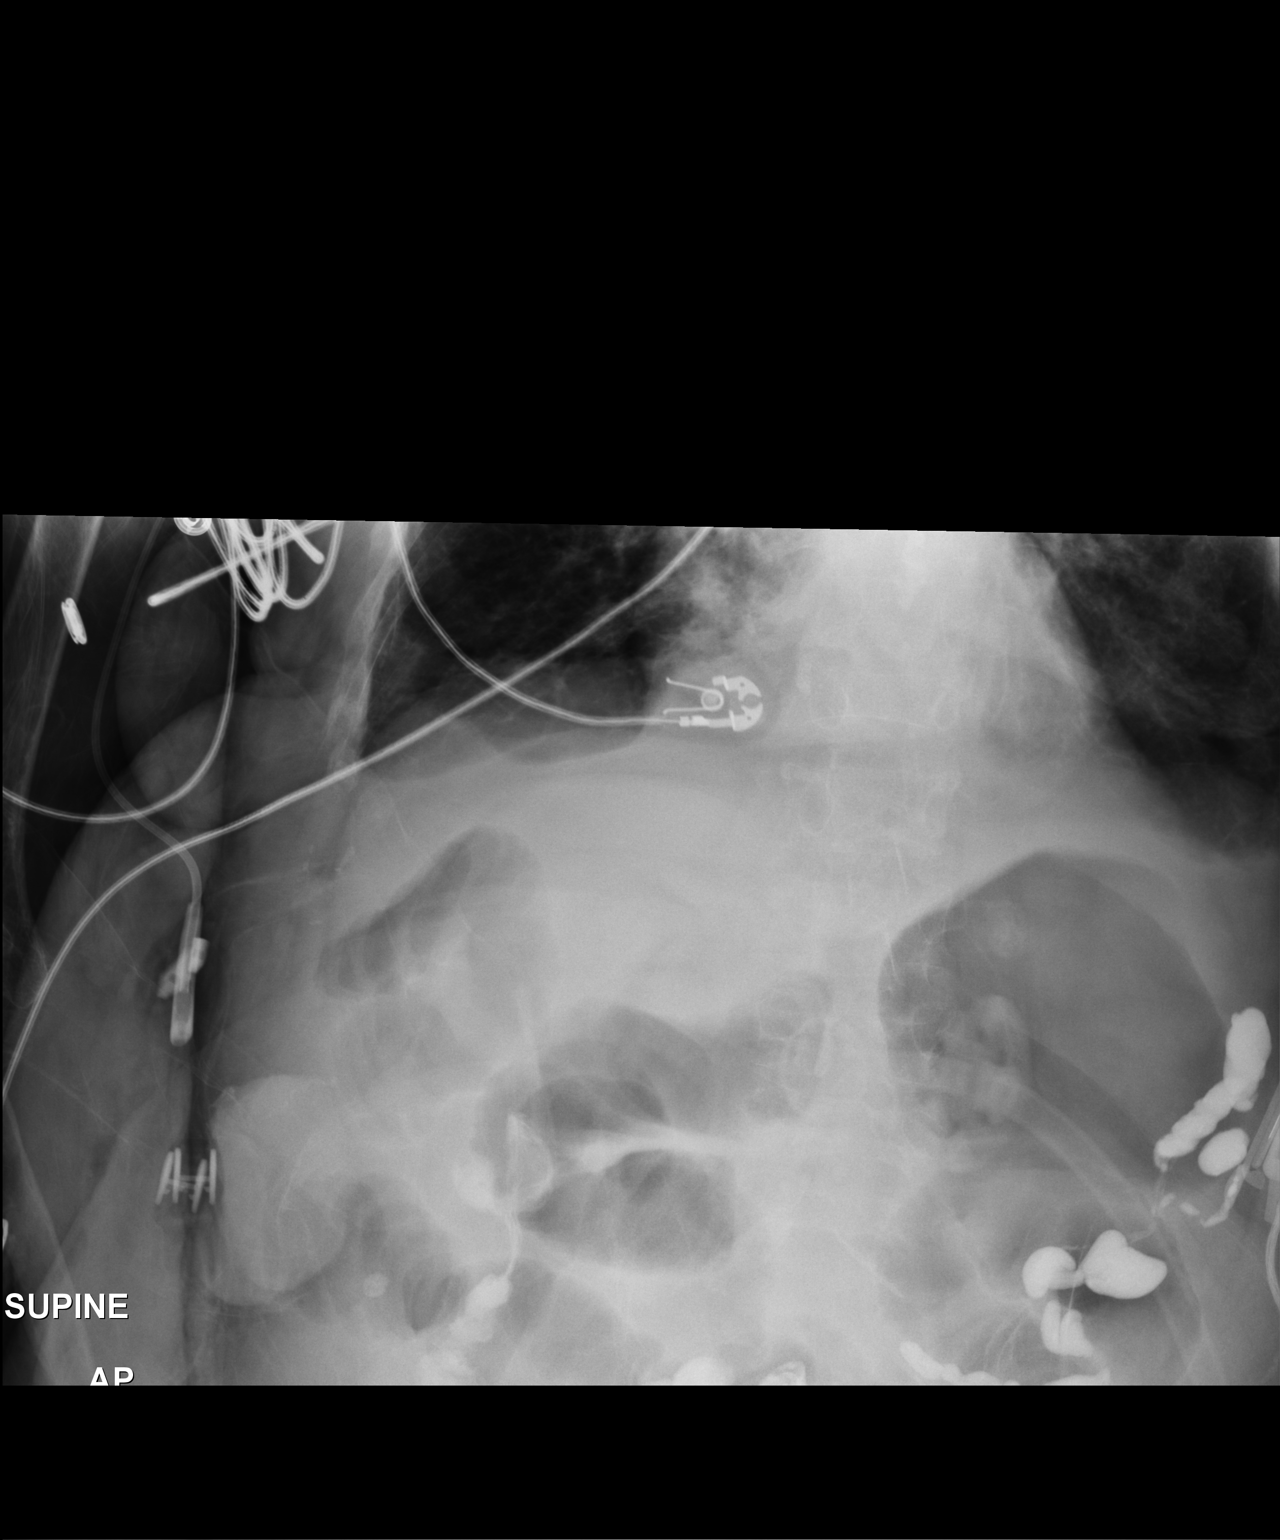

[AP (2 of 2)]
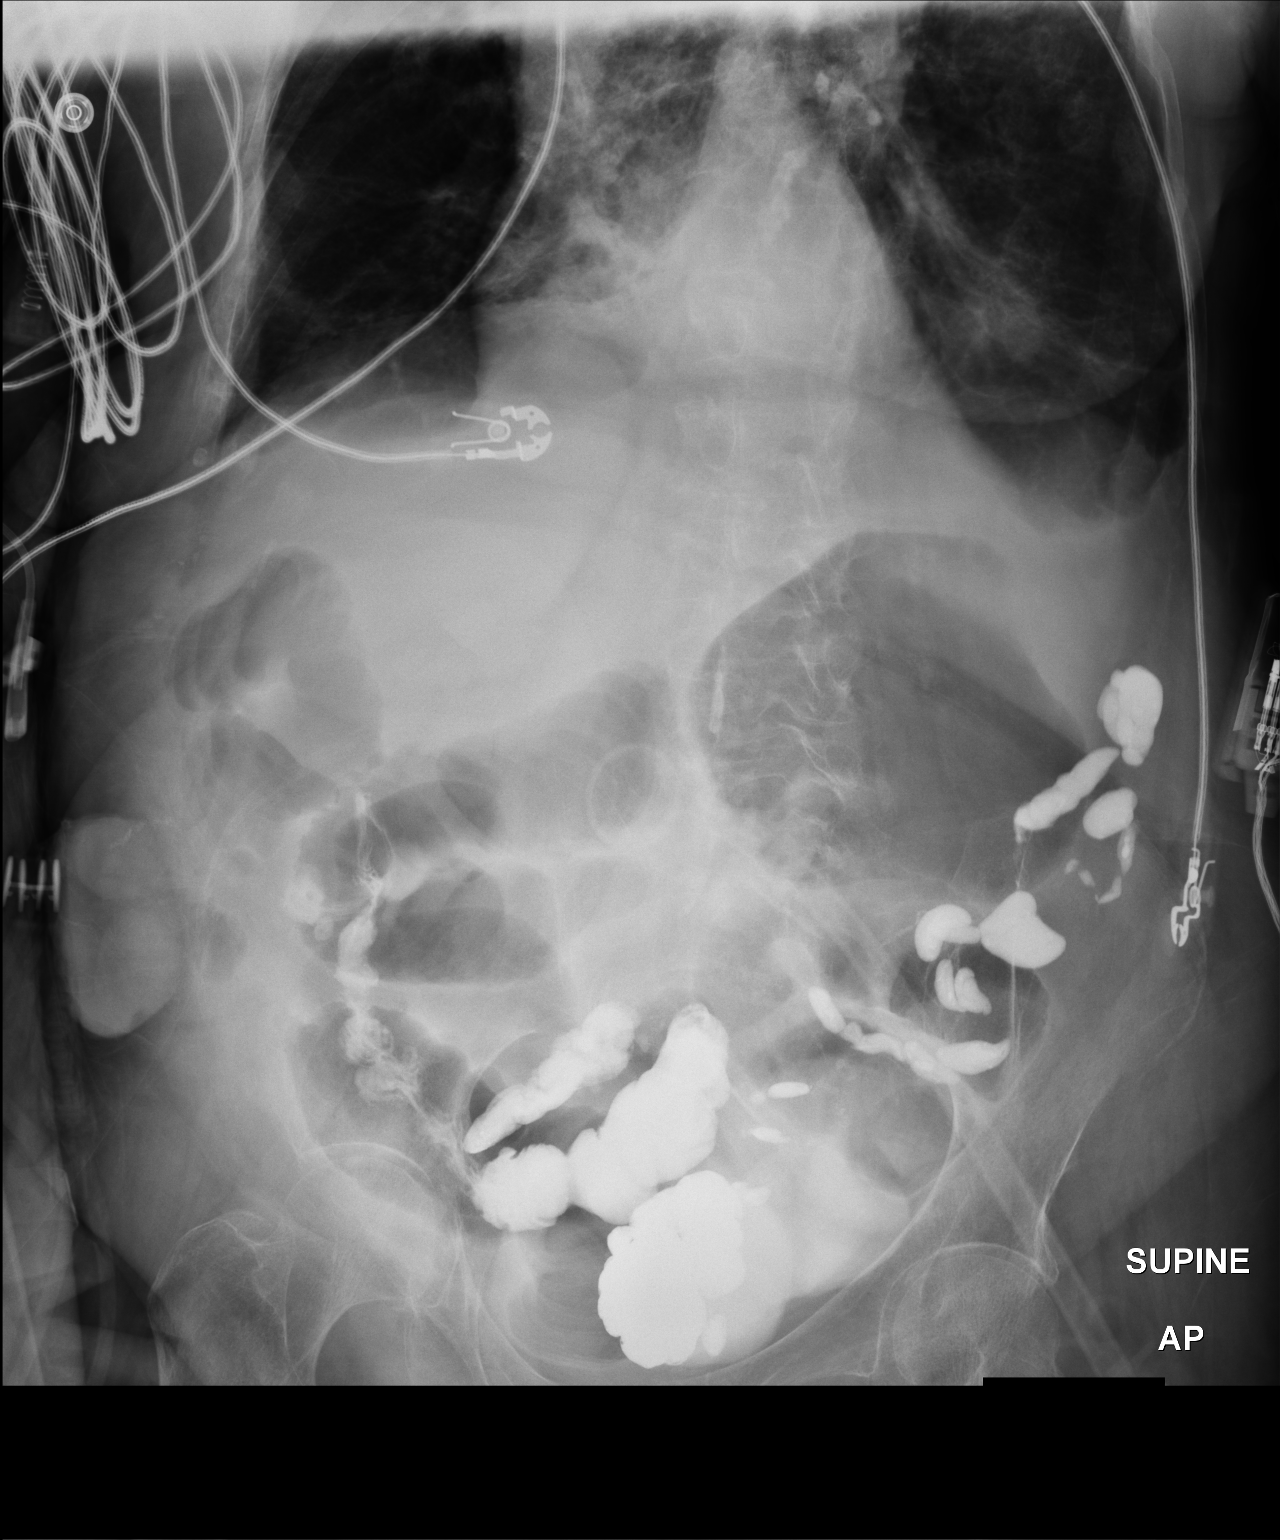

[2 of 2 positions shown; findings below may reference images not displayed]

FINDINGS: Supine view of the abdomen shows gastrostomy tube overlying the
midline abdomen. Gaseous bowel distension is similar to slightly
decreased when comparing to the scout film from the previous CT scan
and the previous abdominal x-ray. Contrast material from the
previous studies is now visualized in a nondilated colon. Mild
collapse/consolidation seen at the left base with associated small
left pleural effusion.
IMPRESSION: Mild gaseous small bowel distension, slightly improved in the
interval since the prior studies.
